# Patient Record
Sex: Male | Born: 1960 | Race: Asian | Hispanic: No | Marital: Married | Smoking: Never smoker
Health system: Southern US, Community
[De-identification: ages and names within clinical notes are randomized; demographics above are authoritative.]

## PROBLEM LIST (undated history)

## (undated) DIAGNOSIS — E119 Type 2 diabetes mellitus without complications: Secondary | ICD-10-CM

## (undated) DIAGNOSIS — C801 Malignant (primary) neoplasm, unspecified: Secondary | ICD-10-CM

## (undated) DIAGNOSIS — C259 Malignant neoplasm of pancreas, unspecified: Secondary | ICD-10-CM

## (undated) DIAGNOSIS — I10 Essential (primary) hypertension: Secondary | ICD-10-CM

## (undated) DIAGNOSIS — K859 Acute pancreatitis without necrosis or infection, unspecified: Secondary | ICD-10-CM

## (undated) DIAGNOSIS — J302 Other seasonal allergic rhinitis: Secondary | ICD-10-CM

## (undated) DIAGNOSIS — K831 Obstruction of bile duct: Secondary | ICD-10-CM

## (undated) HISTORY — PX: HERNIA REPAIR: SHX51

## (undated) HISTORY — PX: VASECTOMY: SHX75

## (undated) HISTORY — PX: OTHER SURGICAL HISTORY: SHX169

---

## 2010-09-21 ENCOUNTER — Ambulatory Visit: Payer: Self-pay | Admitting: Diagnostic Radiology

## 2010-11-10 ENCOUNTER — Emergency Department (HOSPITAL_BASED_OUTPATIENT_CLINIC_OR_DEPARTMENT_OTHER): Admission: EM | Admit: 2010-11-10 | Discharge: 2010-09-21 | Payer: Self-pay | Admitting: Emergency Medicine

## 2012-11-04 ENCOUNTER — Emergency Department (HOSPITAL_BASED_OUTPATIENT_CLINIC_OR_DEPARTMENT_OTHER)
Admission: EM | Admit: 2012-11-04 | Discharge: 2012-11-04 | Disposition: A | Discharge status: Home / Self Care | Payer: BC Managed Care – PPO | Attending: Emergency Medicine | Admitting: Emergency Medicine

## 2012-11-04 ENCOUNTER — Encounter (HOSPITAL_BASED_OUTPATIENT_CLINIC_OR_DEPARTMENT_OTHER): Payer: Self-pay | Admitting: *Deleted

## 2012-11-04 ENCOUNTER — Emergency Department (HOSPITAL_BASED_OUTPATIENT_CLINIC_OR_DEPARTMENT_OTHER): Payer: BC Managed Care – PPO

## 2012-11-04 DIAGNOSIS — R17 Unspecified jaundice: Secondary | ICD-10-CM | POA: Insufficient documentation

## 2012-11-04 DIAGNOSIS — E119 Type 2 diabetes mellitus without complications: Secondary | ICD-10-CM | POA: Insufficient documentation

## 2012-11-04 DIAGNOSIS — K838 Other specified diseases of biliary tract: Secondary | ICD-10-CM

## 2012-11-04 DIAGNOSIS — I10 Essential (primary) hypertension: Secondary | ICD-10-CM | POA: Insufficient documentation

## 2012-11-04 DIAGNOSIS — Q4479 Other congenital malformations of liver: Secondary | ICD-10-CM | POA: Insufficient documentation

## 2012-11-04 DIAGNOSIS — Q441 Other congenital malformations of gallbladder: Secondary | ICD-10-CM | POA: Insufficient documentation

## 2012-11-04 DIAGNOSIS — Z79899 Other long term (current) drug therapy: Secondary | ICD-10-CM | POA: Insufficient documentation

## 2012-11-04 HISTORY — DX: Essential (primary) hypertension: I10

## 2012-11-04 HISTORY — DX: Type 2 diabetes mellitus without complications: E11.9

## 2012-11-04 LAB — CBC WITH DIFFERENTIAL/PLATELET
Basophils Absolute: 0 10*3/uL (ref 0.0–0.1)
Eosinophils Absolute: 0.2 10*3/uL (ref 0.0–0.7)
Eosinophils Relative: 3 % (ref 0–5)
HCT: 36.6 % — ABNORMAL LOW (ref 39.0–52.0)
Lymphocytes Relative: 24 % (ref 12–46)
MCH: 30.2 pg (ref 26.0–34.0)
MCHC: 34.7 g/dL (ref 30.0–36.0)
MCV: 87.1 fL (ref 78.0–100.0)
Monocytes Absolute: 0.5 10*3/uL (ref 0.1–1.0)
Platelets: 228 10*3/uL (ref 150–400)
RDW: 14.1 % (ref 11.5–15.5)

## 2012-11-04 LAB — COMPREHENSIVE METABOLIC PANEL
ALT: 72 U/L — ABNORMAL HIGH (ref 0–53)
AST: 56 U/L — ABNORMAL HIGH (ref 0–37)
CO2: 22 mEq/L (ref 19–32)
Calcium: 9.4 mg/dL (ref 8.4–10.5)
Creatinine, Ser: 0.6 mg/dL (ref 0.50–1.35)
GFR calc Af Amer: >90 mL/min (ref >90)
GFR calc non Af Amer: >90 mL/min (ref >90)
Glucose, Bld: 152 mg/dL — ABNORMAL HIGH (ref 70–99)
Sodium: 137 mEq/L (ref 135–145)
Total Protein: 7.3 g/dL (ref 6.0–8.3)

## 2012-11-04 LAB — PROTIME-INR
INR: 1.08 (ref 0.00–1.49)
Prothrombin Time: 13.9 seconds (ref 11.6–15.2)

## 2012-11-04 LAB — APTT: aPTT: 37 seconds (ref 24–37)

## 2012-11-04 MED ORDER — IOHEXOL 300 MG/ML  SOLN
50.0000 mL | Freq: Once | INTRAMUSCULAR | Status: AC | PRN
  Administered 2012-11-04: 50 mL via ORAL

## 2012-11-04 MED ORDER — SODIUM CHLORIDE 0.9 % IV BOLUS (SEPSIS)
1000.0000 mL | Freq: Once | INTRAVENOUS | Status: AC
  Administered 2012-11-04: 1000 mL via INTRAVENOUS

## 2012-11-04 MED ORDER — IOHEXOL 300 MG/ML  SOLN
100.0000 mL | Freq: Once | INTRAMUSCULAR | Status: AC | PRN
  Administered 2012-11-04: 100 mL via INTRAVENOUS

## 2012-11-04 NOTE — ED Provider Notes (Signed)
History   This chart was scribed for Hector Dern B. Bernette Mayers, MD by Hector Pugh, ED Scribe. This patient was seen in room MH03/MH03 and the patient's care was started at 16:32.   CSN: [id number]  Arrival date & time 11/04/12  1632   First MD Initiated Contact with Patient 11/04/12 1701      Chief Complaint  Patient presents with  . Eye Problem     The history is provided by the patient and a relative. No language interpreter was used.   Hector Pugh is a 51 y.o. male who presents to the Emergency Department complaining of jaundice eyes which began earlier today. His daughter reports that in September he was diagnosed with DM, and has been taking metformin 2x daily to treat it. He had had 3 liver panels drawn previously this year, which showed elevated liver enzymes, normal, and elevated respecitvely. He also had an ultrasound done, although he does not know the results. The patient reports ongoing chills and intermittent body shakes for the past 3 weeks. The patient's daughter also reports decreased appetite, weight loss (25 lbs in the past 3 months), lethargy and occasional abdominal pain. The patient reports that he has stopped taking his evening dose of metformin and has not had improved symptoms, although his sugar levels remained within normal range. He reports taking 500 mg of Tylenol consistently for 2-3 weeks with no relief. He denies emesis, fever, diarrhea or SOB. He has not traveled out of the country recently. He has a history of HTN but denies taking any statins.  Past Medical History  Diagnosis Date  . Diabetes mellitus without complication   . Hypertension     Past Surgical History  Procedure Date  . Hernia repair     History reviewed. No pertinent family history.  History  Substance Use Topics  . Smoking status: Never Smoker   . Smokeless tobacco: Not on file  . Alcohol Use: No      Review of Systems A complete 10 system review of systems was obtained and all  systems are negative except as noted in the HPI and PMH.   Allergies  Review of patient's allergies indicates no known allergies.  Home Medications   Current Outpatient Rx  Name  Route  Sig  Dispense  Refill  . METFORMIN HCL 500 MG PO TABS   Oral   Take 500 mg by mouth 2 (two) times daily with a meal.         . METOPROLOL SUCCINATE ER 100 MG PO TB24   Oral   Take 100 mg by mouth daily. Take with or immediately following a meal.           Triage Vitals: BP 166/67  Pulse 84  Temp 98.1 F (36.7 C) (Oral)  Resp 16  Ht 5\' 2"  (1.575 m)  Wt 150 lb (68.04 kg)  BMI 27.44 kg/m2  SpO2 99%  Physical Exam  Nursing note and vitals reviewed. Constitutional: He is oriented to person, place, and time. He appears well-developed and well-nourished.  HENT:  Head: Normocephalic and atraumatic.  Eyes: EOM are normal. Pupils are equal, round, and reactive to light.       Jaundice to both eyes.  Neck: Normal range of motion. Neck supple.  Cardiovascular: Normal rate, normal heart sounds and intact distal pulses.   Pulmonary/Chest: Effort normal and breath sounds normal.  Abdominal: Bowel sounds are normal. He exhibits no distension. There is no tenderness.  Musculoskeletal: Normal range of motion.  He exhibits no edema and no tenderness.  Neurological: He is alert and oriented to person, place, and time. He has normal strength. No cranial nerve deficit or sensory deficit.  Skin: Skin is warm and dry. No rash noted.  Psychiatric: He has a normal mood and affect.    ED Course  Procedures (including critical care time) DIAGNOSTIC STUDIES: Oxygen Saturation is 99% on room air, normal by my interpretation.    COORDINATION OF CARE: 5:15 PM Discussed treatment plan which includes reviewing ultrasounds and possible CT with pt at bedside and pt agreed to plan. 5:54 PM Rechecked patient. Discussed lab results and ultrasound report and ordered a CT scan .    Labs Reviewed  CBC WITH  DIFFERENTIAL - Abnormal; Notable for the following:    RBC 4.20 (*)     Hemoglobin 12.7 (*)     HCT 36.6 (*)     All other components within normal limits  COMPREHENSIVE METABOLIC PANEL - Abnormal; Notable for the following:    Glucose, Bld 152 (*)     Albumin 3.3 (*)     AST 56 (*)     ALT 72 (*)     Alkaline Phosphatase 189 (*)     Total Bilirubin 11.0 (*)     All other components within normal limits  LIPASE, BLOOD - Abnormal; Notable for the following:    Lipase 784 (*)     All other components within normal limits  PROTIME-INR  APTT   Ct Abdomen Pelvis W Contrast  11/04/2012  *RADIOLOGY REPORT*  Clinical Data: Painless jaundice.  Evaluate for pancreatic mass.  CT ABDOMEN AND PELVIS WITH CONTRAST  Technique:  Multidetector CT imaging of the abdomen and pelvis was performed following the standard protocol during bolus administration of intravenous contrast.  Contrast: 50mL OMNIPAQUE IOHEXOL 300 MG/ML  SOLN, OMNIPAQUE IOHEXOL 300 MG/ML  SOLN  Comparison: None.  Findings: Lung bases are clear.  No pericardial or pleural effusion.  There is mild fatty infiltration of the liver.  The gallbladder appears normal.  Common bile duct is increased in caliber measuring 13 mm, image 31.  The pancreatic duct is also increased in caliber measuring 9 mm, image 41.  At the level of the ampulla there is a focal area of low attenuation measuring 1.4 x 1.5 cm, image 44 of the coronal series.  The spleen appears normal.  Both adrenal glands are normal.  Normal appearance of the kidneys. The urinary bladder appears normal.  Prostate gland and seminal vesicles are negative.  There is no aneurysm identified.  No upper abdominal or pelvic adenopathy identified.  The stomach and the small bowel loops appear normal.  The appendix is visualized and is unremarkable.  Normal appearance of the colon.  Review of the visualized bony structures is unremarkable.  No aggressive lytic or sclerotic bone lesions identified.   IMPRESSION:  1.  Abnormal dilatation of both the pancreatic duct and common bile duct which likely explains the patient's jaundice.  The ducts are dilated to the level of the ampulla and may reflect underlying obstructing mass.  A prominent area of relative hypo attenuation is identified at the level of the ampulla and this may represent a small tumor.  Alternatively, these findings may be secondary to distal common bile duct stone which is occult on CT.  Further evaluation with ERCP and/or MRCP is advised.   Original Report Authenticated By: Signa Kell, M.D.      No diagnosis found.  MDM  Reviewed CT images. Discussed results with patient and family at bedside. He remains pain free and does not want admission. He has been seen by [facility] in the past for routine colonoscopy and would like to follow up with them. I discussed with Dr. Andrey Campanile on call for [facility] and he agrees with close outpatient eval for ERCP. Pt and family advised to call [facility] in the AM to schedule an appointment.   I personally performed the services described in this documentation, which was scribed in my presence. The recorded information has been reviewed and is accurate.          Aziel Morgan B. Bernette Mayers, MD 11/04/12 [phone]

## 2012-11-04 NOTE — ED Notes (Signed)
Pt is drinking PO contrast 

## 2012-11-04 NOTE — ED Notes (Signed)
Pt c/o jaundice eyes.  Hx of elevated liver panel

## 2012-11-04 NOTE — ED Notes (Signed)
MD at bedside. 

## 2012-11-04 NOTE — ED Notes (Signed)
Patient transported to CT via stretcher.

## 2012-11-04 NOTE — ED Notes (Signed)
Family at bedside. 

## 2012-11-04 NOTE — ED Notes (Signed)
Pt returned from radiology.

## 2012-11-06 ENCOUNTER — Encounter (HOSPITAL_COMMUNITY): Payer: Self-pay | Admitting: Pharmacy Technician

## 2012-11-06 ENCOUNTER — Encounter (HOSPITAL_COMMUNITY): Payer: Self-pay | Admitting: *Deleted

## 2012-11-06 NOTE — Progress Notes (Signed)
EKG and CXR on chart from medical doctor's office from 03-2012.

## 2012-11-06 NOTE — Pre-Procedure Instructions (Signed)
Your procedure is scheduled on: Friday, November 07, 2012 Report to [facility] Admitting at:1000 Call this number if you have problems morning of your procedure:[phone]  Follow all bowel prep instructions per your doctor's orders.  Do not eat or drink anything after midnight the night before your procedure. You may brush your teeth, rinse out your mouth, but no water, no food, no chewing gum, no mints, no candies, no chewing tobacco.     Take these medicines the morning of your procedure with A SIP OF WATER: Metoprolol   Please make arrangements for a responsible person to drive you home after the procedure. You cannot go home by cab/taxi. We recommend you have someone with you at home the first 24 hours after your procedure. Driver for procedure is  LEAVE ALL VALUABLES, JEWELRY, BILLFOLD AT HOME.  NO DENTURES, CONTACT LENSES ALLOWED IN THE ENDOSCOPY ROOM.   YOU MAY WEAR DEODORANT, PLEASE REMOVE ALL JEWELRY, WATCHES RINGS, BODY PIERCINGS AND LEAVE AT HOME.   WOMEN: NO MAKE-UP, LOTIONS PERFUMES

## 2012-11-07 ENCOUNTER — Encounter (HOSPITAL_COMMUNITY): Payer: Self-pay | Admitting: *Deleted

## 2012-11-08 ENCOUNTER — Encounter (HOSPITAL_COMMUNITY): Payer: Self-pay | Admitting: *Deleted

## 2012-11-08 ENCOUNTER — Encounter (HOSPITAL_COMMUNITY): Payer: Self-pay | Admitting: Anesthesiology

## 2012-11-08 ENCOUNTER — Ambulatory Visit (HOSPITAL_COMMUNITY)
Admission: RE | Admit: 2012-11-08 | Discharge: 2012-11-08 | Disposition: A | Discharge status: Home / Self Care | Payer: BC Managed Care – PPO | Source: Ambulatory Visit | Attending: Gastroenterology | Admitting: Gastroenterology

## 2012-11-08 ENCOUNTER — Ambulatory Visit (HOSPITAL_COMMUNITY): Payer: BC Managed Care – PPO

## 2012-11-08 ENCOUNTER — Ambulatory Visit (HOSPITAL_COMMUNITY): Payer: BC Managed Care – PPO | Admitting: Anesthesiology

## 2012-11-08 ENCOUNTER — Encounter (HOSPITAL_COMMUNITY)
Admission: RE | Disposition: A | Discharge status: Home / Self Care | Payer: Self-pay | Source: Ambulatory Visit | Attending: Gastroenterology

## 2012-11-08 DIAGNOSIS — I10 Essential (primary) hypertension: Secondary | ICD-10-CM | POA: Diagnosis present

## 2012-11-08 DIAGNOSIS — Z79899 Other long term (current) drug therapy: Secondary | ICD-10-CM | POA: Insufficient documentation

## 2012-11-08 DIAGNOSIS — E119 Type 2 diabetes mellitus without complications: Secondary | ICD-10-CM | POA: Insufficient documentation

## 2012-11-08 DIAGNOSIS — Y9210 Unspecified residential institution as the place of occurrence of the external cause: Secondary | ICD-10-CM | POA: Diagnosis present

## 2012-11-08 DIAGNOSIS — C259 Malignant neoplasm of pancreas, unspecified: Secondary | ICD-10-CM | POA: Insufficient documentation

## 2012-11-08 DIAGNOSIS — C241 Malignant neoplasm of ampulla of Vater: Secondary | ICD-10-CM | POA: Diagnosis present

## 2012-11-08 DIAGNOSIS — K859 Acute pancreatitis without necrosis or infection, unspecified: Principal | ICD-10-CM | POA: Diagnosis present

## 2012-11-08 DIAGNOSIS — Y849 Medical procedure, unspecified as the cause of abnormal reaction of the patient, or of later complication, without mention of misadventure at the time of the procedure: Secondary | ICD-10-CM | POA: Diagnosis present

## 2012-11-08 DIAGNOSIS — K831 Obstruction of bile duct: Secondary | ICD-10-CM | POA: Diagnosis present

## 2012-11-08 DIAGNOSIS — K838 Other specified diseases of biliary tract: Secondary | ICD-10-CM | POA: Insufficient documentation

## 2012-11-08 HISTORY — PX: FINE NEEDLE ASPIRATION: SHX5430

## 2012-11-08 HISTORY — PX: EUS: SHX5427

## 2012-11-08 HISTORY — PX: ERCP: SHX5425

## 2012-11-08 LAB — GLUCOSE, CAPILLARY
Glucose-Capillary: 113 mg/dL — ABNORMAL HIGH (ref 70–99)
Glucose-Capillary: 118 mg/dL — ABNORMAL HIGH (ref 70–99)

## 2012-11-08 SURGERY — UPPER ENDOSCOPIC ULTRASOUND (EUS) LINEAR
Anesthesia: General

## 2012-11-08 MED ORDER — FENTANYL CITRATE 0.05 MG/ML IJ SOLN: 25.0000 ug | INTRAMUSCULAR | Status: DC | PRN

## 2012-11-08 MED ORDER — SUCCINYLCHOLINE CHLORIDE 20 MG/ML IJ SOLN
INTRAMUSCULAR | Status: DC | PRN
  Administered 2012-11-08: 100 mg via INTRAVENOUS

## 2012-11-08 MED ORDER — PROPOFOL 10 MG/ML IV BOLUS
INTRAVENOUS | Status: DC | PRN
  Administered 2012-11-08: 180 mg via INTRAVENOUS

## 2012-11-08 MED ORDER — IOHEXOL 300 MG/ML  SOLN
INTRAMUSCULAR | Status: DC | PRN
  Administered 2012-11-08: 23 mL

## 2012-11-08 MED ORDER — ONDANSETRON HCL 4 MG/2ML IJ SOLN
INTRAMUSCULAR | Status: DC | PRN
  Administered 2012-11-08: 4 mg via INTRAVENOUS

## 2012-11-08 MED ORDER — LACTATED RINGERS IV SOLN
INTRAVENOUS | Status: DC
  Administered 2012-11-08 (×3): via INTRAVENOUS

## 2012-11-08 MED ORDER — LIDOCAINE HCL (CARDIAC) 20 MG/ML IV SOLN
INTRAVENOUS | Status: DC | PRN
  Administered 2012-11-08: 50 mg via INTRAVENOUS

## 2012-11-08 MED ORDER — METOCLOPRAMIDE HCL 5 MG/ML IJ SOLN
INTRAMUSCULAR | Status: DC | PRN
  Administered 2012-11-08: 10 mg via INTRAVENOUS

## 2012-11-08 MED ORDER — CIPROFLOXACIN IN D5W 400 MG/200ML IV SOLN
400.0000 mg | Freq: Once | INTRAVENOUS | Status: AC
  Administered 2012-11-08: 400 mg via INTRAVENOUS

## 2012-11-08 MED ORDER — SODIUM CHLORIDE 0.9 % IV SOLN: INTRAVENOUS | Status: DC

## 2012-11-08 MED ORDER — FENTANYL CITRATE 0.05 MG/ML IJ SOLN
INTRAMUSCULAR | Status: DC | PRN
  Administered 2012-11-08: 100 ug via INTRAVENOUS
  Administered 2012-11-08: 50 ug via INTRAVENOUS

## 2012-11-08 MED ORDER — ROCURONIUM BROMIDE 100 MG/10ML IV SOLN
INTRAVENOUS | Status: DC | PRN
  Administered 2012-11-08: 15 mg via INTRAVENOUS

## 2012-11-08 MED ORDER — MIDAZOLAM HCL 5 MG/5ML IJ SOLN
INTRAMUSCULAR | Status: DC | PRN
  Administered 2012-11-08: 2 mg via INTRAVENOUS

## 2012-11-08 MED ORDER — CIPROFLOXACIN IN D5W 400 MG/200ML IV SOLN
INTRAVENOUS | Status: AC
  Filled 2012-11-08: qty 200

## 2012-11-08 NOTE — OR Nursing (Signed)
Spoke to Dr. Ewing Schlein and Dr. Leta Jungling, both stated it was okay for pt to be discharged, as pt is stable.  Angelique Blonder, RN

## 2012-11-08 NOTE — Anesthesia Preprocedure Evaluation (Addendum)
Anesthesia Evaluation  Patient identified by MRN, date of birth, ID band Patient awake    Reviewed: Allergy & Precautions, H&P , NPO status , Patient's Chart, lab work & pertinent test results, reviewed documented beta blocker date and time   Airway Mallampati: II TM Distance: >3 FB Neck ROM: full    Dental No notable dental hx.    Pulmonary neg pulmonary ROS,  breath sounds clear to auscultation  Pulmonary exam normal       Cardiovascular Exercise Tolerance: Good hypertension, Pt. on home beta blockers Rhythm:regular Rate:Normal     Neuro/Psych negative neurological ROS  negative psych ROS   GI/Hepatic negative GI ROS, Neg liver ROS,   Endo/Other  diabetes, Well Controlled, Type 2, Oral Hypoglycemic Agents  Renal/GU negative Renal ROS  negative genitourinary   Musculoskeletal   Abdominal   Peds  Hematology negative hematology ROS (+)   Anesthesia Other Findings   Reproductive/Obstetrics negative OB ROS                           Anesthesia Physical Anesthesia Plan  ASA: III  Anesthesia Plan: General   Post-op Pain Management:    Induction:   Airway Management Planned: Oral ETT  Additional Equipment:   Intra-op Plan:   Post-operative Plan: Extubation in OR  Informed Consent: I have reviewed the patients History and Physical, chart, labs and discussed the procedure including the risks, benefits and alternatives for the proposed anesthesia with the patient or authorized representative who has indicated his/her understanding and acceptance.   Dental Advisory Given  Plan Discussed with: CRNA and Surgeon  Anesthesia Plan Comments:        Anesthesia Quick Evaluation

## 2012-11-08 NOTE — H&P (Signed)
Patient interval history reviewed.  Patient examined again.  There has been no change from documented H/P dated 11/05/12 (scanned into chart from our office) except as documented above.  Assessment:  1.  Obstructive jaundice.  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  Plan:  1.  Endoscopic ultrasound with possible fine needle aspiration (FNA). 2.  Risks (bleeding, infection, bowel perforation that could require surgery, sedation-related changes in cardiopulmonary systems), benefits (identification and possible treatment of source of symptoms, exclusion of certain causes of symptoms), and alternatives (watchful waiting, radiographic imaging studies, empiric medical treatment) of upper endoscopy with ultrasound and possible fine needle aspiration (EUS +/- FNA) were explained to patient/wife in detail and he wishes to proceed. 3.  Endoscopic retrograde cholangiopancreatography (ERCP) with likely stent (likely wallstent) placement. 4.  Risks (up to and including bleeding, infection, perforation, pancreatitis that can be complicated by infected necrosis and death), benefits (removal of stones, alleviating blockage, decreasing risk of cholangitis or choledocholithiasis-related pancreatitis), and alternatives (watchful waiting, percutaneous transhepatic cholangiography) of ERCP were explained to patient/wife in detail and he elects to proceed.

## 2012-11-08 NOTE — Transfer of Care (Signed)
Immediate Anesthesia Transfer of Care Note  Patient: Hector Pugh  Procedure(s) Performed: Procedure(s) (LRB) with comments: UPPER ENDOSCOPIC ULTRASOUND (EUS) LINEAR (N/A) FINE NEEDLE ASPIRATION (FNA) LINEAR (N/A) ENDOSCOPIC RETROGRADE CHOLANGIOPANCREATOGRAPHY (ERCP) (N/A)  Patient Location: PACU and Endoscopy Unit  Anesthesia Type:General  Level of Consciousness: awake, alert , oriented and patient cooperative  Airway & Oxygen Therapy: Patient Spontanous Breathing and Patient connected to face mask oxygen  Post-op Assessment: Report given to PACU RN, Post -op Vital signs reviewed and stable and Patient moving all extremities  Post vital signs: Reviewed and stable  Complications: No apparent anesthesia complications

## 2012-11-08 NOTE — Op Note (Signed)
[facility] [address] [place], [address]   ERCP PROCEDURE REPORT  PATIENT: Hector Pugh, Hector Pugh  MR# :[id number] BIRTHDATE: Aug 08, 1961  GENDER: Male ENDOSCOPIST: Vida Rigger, MD REFERRED BY: PROCEDURE DATE:  11/08/2012 PROCEDURE:   ERCP with sphincterotomy/papillotomy and ERCP with stent placement ASA CLASS:    2 INDICATIONS: obstructive jaundicein patient with positive biopsy MEDICATIONS:   general anesthesia TOPICAL ANESTHETIC:  none  DESCRIPTION OF PROCEDURE:   After the risks benefits and alternatives of the procedure were thoroughly explained, informed consent was obtained.  The Pentax ERCP [id number]  endoscope was introduced through the mouth and advanced to the second portion of the duodenum .the ampullary mass was brought into view and using the triple lumen sphincterotome loaded with the JAG Jaguar the pancreatic duct was cannulated which was dilated and we went ahead and left the wire in there and then were able to get deep selective cannulation on the next attempt and only minimal dye was placed in the pancreas to confirm our location and wants deep selective cannulation of the CBD was confirmed and the JAG Jagwire was advanced into the intrahepatic and dye was injected to confirm that we were in the CBD we went ahead and remove the wire from the pancreas and then proceeded with a small sphincterotomyin the customary fashionuntil we could get the one third bowed  sphincter tone easily in and out of the duct.the stricture was evaluated under fluoroscopyand seemed to be very short and we went ahead and placed a fully coveredremovable metal stent 10 French 4 cm in the customary fashion both under endoscopic and fluoroscopy guidance with excellent biliary drainage and the wire was removed and the scope was removed and there was adequate biliary drainage and some pancreatic duct drainage although sluggish and we elected to stop the procedure at this point  and the scope was removed and the patient tolerated the procedure well       COMPLICATIONS: none ENDOSCOPIC IMPRESSION:ampullary mass with short distal stricture status post small sphincterotomy and mental fully covered stent  RECOMMENDATIONS:observe for delayed complications if none surgical consultation per my partner Dr. Dulce Sellar     _______________________________ eSigned:  Vida Rigger, MD 11/08/2012 2:09 PM   CC:

## 2012-11-08 NOTE — Op Note (Signed)
[facility] [address] [place], [address]   ENDOSCOPIC ULTRASOUND PROCEDURE REPORT  PATIENT: Hector Pugh, Hector Pugh  MR#: [id number] BIRTHDATE: [birth year]  GENDER: Male ENDOSCOPIST: Willis Modena, MD REFERRED BY:  Arna Medici.  Kathrynn Speed, M.D. PROCEDURE DATE:  11/08/2012 PROCEDURE:   Upper EUS w/FNA ASA CLASS:      Class III INDICATIONS:   1.  obstructive jaundice, abnormal CT. MEDICATIONS:  DESCRIPTION OF PROCEDURE:   After the risks benefits and alternatives of the procedure were  explained, informed consent was obtained. The patient was then placed in the left, lateral, decubitus postion and IV sedation was administered. Throughout the procedure, the patients blood pressure, pulse and oxygen saturations were monitored continuously.  Under direct visualization, the linear       endoscope was introduced through the mouth  and advanced to the second portion of the duodenum . Water was used as necessary to provide an acoustic interface.  Upon completion of the imaging, water was removed and the patient was sent to the recovery room in satisfactory condition.    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  Bile duct and pancreatic duct were both dilated upstream of an 18 x 20 mm ampulla lesion, visualized both endoscopically and via EUS.  The pancreas does not appear to be invaded by this lesion.  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  There was no neighboring adenopathy and no involvement of lesion with SMA, celiac artery or portal vein.  IMPRESSION:      Ampullary lesion, preliminary biopsies consistent with adenocarcinoma.  No clear involvement with pancreas or neighboring structures.  RECOMMENDATIONS:     1.  Watch for potential complications of procedure. 2.  Await final cytology results. 3.  Proceed with  ERCP with Dr. Ewing Schlein today. 4.  Expedited surgical consultation with Dr. Donell Beers for consideration of Whipple's procedure.   _______________________________ eSigned:  Willis Modena, MD 11/08/2012 1:29 PM   CC:

## 2012-11-08 NOTE — Anesthesia Postprocedure Evaluation (Signed)
  Anesthesia Post-op Note  Patient: Hector Pugh  Procedure(s) Performed: Procedure(s) (LRB): UPPER ENDOSCOPIC ULTRASOUND (EUS) LINEAR (N/A) FINE NEEDLE ASPIRATION (FNA) LINEAR (N/A) ENDOSCOPIC RETROGRADE CHOLANGIOPANCREATOGRAPHY (ERCP) (N/A)  Patient Location: PACU  Anesthesia Type: General  Level of Consciousness: awake and alert   Airway and Oxygen Therapy: Patient Spontanous Breathing  Post-op Pain: mild  Post-op Assessment: Post-op Vital signs reviewed, Patient's Cardiovascular Status Stable, Respiratory Function Stable, Patent Airway and No signs of Nausea or vomiting  Last Vitals:  Filed Vitals:   11/08/12 1500  BP: 142/95  Pulse:   Temp:   Resp: 35    Post-op Vital Signs: stable   Complications: No apparent anesthesia complications

## 2012-11-09 ENCOUNTER — Encounter (HOSPITAL_COMMUNITY): Payer: Self-pay | Admitting: Emergency Medicine

## 2012-11-09 ENCOUNTER — Inpatient Hospital Stay (HOSPITAL_COMMUNITY)
Admission: EM | Admit: 2012-11-09 | Discharge: 2012-11-16 | DRG: 204 | Disposition: A | Discharge status: Home / Self Care | Payer: BC Managed Care – PPO | Source: Ambulatory Visit | Attending: Gastroenterology | Admitting: Gastroenterology

## 2012-11-09 DIAGNOSIS — R109 Unspecified abdominal pain: Secondary | ICD-10-CM

## 2012-11-09 DIAGNOSIS — K831 Obstruction of bile duct: Secondary | ICD-10-CM | POA: Diagnosis present

## 2012-11-09 DIAGNOSIS — I10 Essential (primary) hypertension: Secondary | ICD-10-CM

## 2012-11-09 DIAGNOSIS — C241 Malignant neoplasm of ampulla of Vater: Secondary | ICD-10-CM

## 2012-11-09 DIAGNOSIS — K859 Acute pancreatitis without necrosis or infection, unspecified: Principal | ICD-10-CM

## 2012-11-09 DIAGNOSIS — E119 Type 2 diabetes mellitus without complications: Secondary | ICD-10-CM

## 2012-11-09 HISTORY — DX: Type 2 diabetes mellitus without complications: E11.9

## 2012-11-09 HISTORY — DX: Acute pancreatitis without necrosis or infection, unspecified: K85.90

## 2012-11-09 HISTORY — DX: Obstruction of bile duct: K83.1

## 2012-11-09 HISTORY — DX: Essential (primary) hypertension: I10

## 2012-11-09 LAB — URINALYSIS, ROUTINE W REFLEX MICROSCOPIC
Hgb urine dipstick: NEGATIVE
Ketones, ur: 15 mg/dL — AB
Specific Gravity, Urine: 1.014 (ref 1.005–1.030)
Urobilinogen, UA: 0.2 mg/dL (ref 0.0–1.0)
pH: 5 (ref 5.0–8.0)

## 2012-11-09 LAB — GLUCOSE, CAPILLARY
Glucose-Capillary: 202 mg/dL — ABNORMAL HIGH (ref 70–99)
Glucose-Capillary: 187 mg/dL — ABNORMAL HIGH (ref 70–99)
Glucose-Capillary: 176 mg/dL — ABNORMAL HIGH (ref 70–99)

## 2012-11-09 LAB — CBC WITH DIFFERENTIAL/PLATELET
Eosinophils Absolute: 0.1 10*3/uL (ref 0.0–0.7)
Hemoglobin: 14.1 g/dL (ref 13.0–17.0)
Lymphocytes Relative: 16 % (ref 12–46)
Lymphs Abs: 2.6 10*3/uL (ref 0.7–4.0)
Monocytes Relative: 4 % (ref 3–12)
Neutro Abs: 12.5 10*3/uL — ABNORMAL HIGH (ref 1.7–7.7)
Neutrophils Relative %: 79 % — ABNORMAL HIGH (ref 43–77)
Platelets: 324 10*3/uL (ref 150–400)
RBC: 4.72 MIL/uL (ref 4.22–5.81)
WBC: 15.8 10*3/uL — ABNORMAL HIGH (ref 4.0–10.5)

## 2012-11-09 LAB — COMPREHENSIVE METABOLIC PANEL
ALT: 60 U/L — ABNORMAL HIGH (ref 0–53)
Alkaline Phosphatase: 202 U/L — ABNORMAL HIGH (ref 39–117)
Chloride: 98 mEq/L (ref 96–112)
GFR calc Af Amer: >90 mL/min (ref >90)
Glucose, Bld: 217 mg/dL — ABNORMAL HIGH (ref 70–99)
Potassium: 3.7 mEq/L (ref 3.5–5.1)
Sodium: 137 mEq/L (ref 135–145)
Total Bilirubin: 11.2 mg/dL — ABNORMAL HIGH (ref 0.3–1.2)
Total Protein: 7.3 g/dL (ref 6.0–8.3)

## 2012-11-09 MED ORDER — SODIUM CHLORIDE 0.9 % IV SOLN
INTRAVENOUS | Status: AC
  Administered 2012-11-09: 13:00:00 via INTRAVENOUS

## 2012-11-09 MED ORDER — SODIUM CHLORIDE 0.9 % IV SOLN
INTRAVENOUS | Status: AC
  Administered 2012-11-09: 100 mL/h via INTRAVENOUS

## 2012-11-09 MED ORDER — HYDROMORPHONE HCL PF 2 MG/ML IJ SOLN
3.0000 mg | Freq: Once | INTRAMUSCULAR | Status: AC
  Administered 2012-11-09: 3 mg via INTRAVENOUS
  Filled 2012-11-09 (×2): qty 1

## 2012-11-09 MED ORDER — HYDROMORPHONE HCL PF 1 MG/ML IJ SOLN
1.0000 mg | Freq: Once | INTRAMUSCULAR | Status: AC
  Administered 2012-11-09: 1 mg via INTRAVENOUS
  Filled 2012-11-09: qty 1

## 2012-11-09 MED ORDER — FENTANYL 25 MCG/HR TD PT72
25.0000 ug | MEDICATED_PATCH | TRANSDERMAL | Status: DC
  Administered 2012-11-09 – 2012-11-12 (×2): 25 ug via TRANSDERMAL
  Filled 2012-11-09 (×2): qty 1

## 2012-11-09 MED ORDER — INSULIN ASPART 100 UNIT/ML ~~LOC~~ SOLN: 0.0000 [IU] | Freq: Three times a day (TID) | SUBCUTANEOUS | Status: DC

## 2012-11-09 MED ORDER — INSULIN ASPART 100 UNIT/ML ~~LOC~~ SOLN: 0.0000 [IU] | Freq: Every day | SUBCUTANEOUS | Status: DC

## 2012-11-09 MED ORDER — METRONIDAZOLE IN NACL 5-0.79 MG/ML-% IV SOLN
500.0000 mg | Freq: Three times a day (TID) | INTRAVENOUS | Status: DC
  Administered 2012-11-09 – 2012-11-14 (×16): 500 mg via INTRAVENOUS
  Filled 2012-11-09 (×17): qty 100

## 2012-11-09 MED ORDER — INSULIN ASPART 100 UNIT/ML ~~LOC~~ SOLN
0.0000 [IU] | SUBCUTANEOUS | Status: DC
  Administered 2012-11-09: 2 [IU] via SUBCUTANEOUS
  Administered 2012-11-09: 5 [IU] via SUBCUTANEOUS
  Administered 2012-11-09: 2 [IU] via SUBCUTANEOUS
  Administered 2012-11-09: 3 [IU] via SUBCUTANEOUS
  Administered 2012-11-10 (×2): 1 [IU] via SUBCUTANEOUS
  Administered 2012-11-10: 3 [IU] via SUBCUTANEOUS
  Administered 2012-11-10: 1 [IU] via SUBCUTANEOUS
  Administered 2012-11-10: 2 [IU] via SUBCUTANEOUS
  Administered 2012-11-11 – 2012-11-13 (×8): 1 [IU] via SUBCUTANEOUS
  Administered 2012-11-13: 2 [IU] via SUBCUTANEOUS
  Administered 2012-11-13: 1 [IU] via SUBCUTANEOUS
  Administered 2012-11-14: 2 [IU] via SUBCUTANEOUS
  Administered 2012-11-14 (×2): 1 [IU] via SUBCUTANEOUS
  Administered 2012-11-14 – 2012-11-15 (×2): 2 [IU] via SUBCUTANEOUS
  Administered 2012-11-15 (×2): 1 [IU] via SUBCUTANEOUS
  Administered 2012-11-15 – 2012-11-16 (×3): 2 [IU] via SUBCUTANEOUS
  Administered 2012-11-16: 1 [IU] via SUBCUTANEOUS

## 2012-11-09 MED ORDER — CIPROFLOXACIN IN D5W 400 MG/200ML IV SOLN
400.0000 mg | Freq: Two times a day (BID) | INTRAVENOUS | Status: DC
  Administered 2012-11-09 – 2012-11-14 (×11): 400 mg via INTRAVENOUS
  Filled 2012-11-09 (×14): qty 200

## 2012-11-09 MED ORDER — PANTOPRAZOLE SODIUM 40 MG IV SOLR
40.0000 mg | INTRAVENOUS | Status: DC
  Administered 2012-11-09 – 2012-11-14 (×5): 40 mg via INTRAVENOUS
  Filled 2012-11-09 (×7): qty 40

## 2012-11-09 MED ORDER — HYDROMORPHONE HCL PF 1 MG/ML IJ SOLN
1.0000 mg | INTRAMUSCULAR | Status: AC | PRN
  Administered 2012-11-09 (×4): 1 mg via INTRAVENOUS
  Filled 2012-11-09 (×4): qty 1

## 2012-11-09 MED ORDER — ONDANSETRON HCL 4 MG/2ML IJ SOLN: 4.0000 mg | Freq: Three times a day (TID) | INTRAMUSCULAR | Status: AC | PRN

## 2012-11-09 MED ORDER — ONDANSETRON HCL 4 MG/2ML IJ SOLN
4.0000 mg | Freq: Four times a day (QID) | INTRAMUSCULAR | Status: DC | PRN
  Administered 2012-11-09: 4 mg via INTRAVENOUS
  Filled 2012-11-09: qty 2

## 2012-11-09 MED ORDER — SODIUM CHLORIDE 0.9 % IV SOLN: INTRAVENOUS | Status: DC

## 2012-11-09 MED ORDER — HYDRALAZINE HCL 20 MG/ML IJ SOLN
10.0000 mg | Freq: Three times a day (TID) | INTRAMUSCULAR | Status: DC | PRN
  Filled 2012-11-09: qty 0.5

## 2012-11-09 NOTE — ED Notes (Signed)
Patient reports that he had an endoscopy earlier today; patient had ERPC with stent placement today..  Patient reports that he has had abdominal pain ever since; was told by the doctor on call to go to the emergency room to rule out appendicitis.

## 2012-11-09 NOTE — ED Notes (Signed)
Pt wants to ambulate to restroom before going upstairs.

## 2012-11-09 NOTE — H&P (Signed)
Hector Pugh is an 51 y.o. male.   Chief Complaint: Abdominal pain HPI: Patient well-known to me from yesterday's ERCP who has done well until about 10 PM at night when he began having mild pain when the pain got worse and Tylenol did not help they called me and I asked them to go to the emergency room and then I notified the emergency room staff of his history and his procedure and he was found to have a lipase of greater than 3000 and increased white count and was admitted for pancreatitis. His ERCP was done for painless jaundice and the double duct sign on CT and his family history is negative and he has minimal previous medical problems and currently he is not having any nausea or vomiting or fever and had passed air last night but not yet today. Past Medical History  Diagnosis Date  . Diabetes mellitus without complication   . Hypertension     Past Surgical History  Procedure Date  . Circucision   . Hernia repair     RIGHT  . Left foot surgery   . Vasectomy   . Circumsision   . Left foot surgery     History reviewed. No pertinent family history. Social History:  reports that he has never smoked. He has never used smokeless tobacco. He reports that he does not drink alcohol or use illicit drugs.  Allergies: No Known Allergies  Medications Prior to Admission  Medication Sig Dispense Refill  . loratadine (CLARITIN) 10 MG tablet Take 10 mg by mouth daily.      . metoprolol succinate (TOPROL-XL) 100 MG 24 hr tablet Take 100 mg by mouth every morning. Take with or immediately following a meal.        Results for orders placed during the hospital encounter of 11/09/12 (from the past 48 hour(s))  URINALYSIS, ROUTINE W REFLEX MICROSCOPIC     Status: Abnormal   Collection Time   11/09/12  3:32 AM      Component Value Range Comment   Color, Urine ORANGE (*) YELLOW BIOCHEMICALS MAY BE AFFECTED BY COLOR   APPearance CLEAR  CLEAR    Specific Gravity, Urine 1.014  1.005 - 1.030    pH 5.0   5.0 - 8.0    Glucose, UA NEGATIVE  NEGATIVE mg/dL    Hgb urine dipstick NEGATIVE  NEGATIVE    Bilirubin Urine LARGE (*) NEGATIVE    Ketones, ur 15 (*) NEGATIVE mg/dL    Protein, ur NEGATIVE  NEGATIVE mg/dL    Urobilinogen, UA 0.2  0.0 - 1.0 mg/dL    Nitrite NEGATIVE  NEGATIVE    Leukocytes, UA NEGATIVE  NEGATIVE MICROSCOPIC NOT DONE ON URINES WITH NEGATIVE PROTEIN, BLOOD, LEUKOCYTES, NITRITE, OR GLUCOSE <1000 mg/dL.  CBC WITH DIFFERENTIAL     Status: Abnormal   Collection Time   11/09/12  3:53 AM      Component Value Range Comment   WBC 15.8 (*) 4.0 - 10.5 K/uL    RBC 4.72  4.22 - 5.81 MIL/uL    Hemoglobin 14.1  13.0 - 17.0 g/dL    HCT 16.1  09.6 - 04.5 %    MCV 88.6  78.0 - 100.0 fL    MCH 29.9  26.0 - 34.0 pg    MCHC 33.7  30.0 - 36.0 g/dL    RDW 40.9  81.1 - 91.4 %    Platelets 324  150 - 400 K/uL    Neutrophils Relative 79 (*) 43 -  77 %    Neutro Abs 12.5 (*) 1.7 - 7.7 K/uL    Lymphocytes Relative 16  12 - 46 %    Lymphs Abs 2.6  0.7 - 4.0 K/uL    Monocytes Relative 4  3 - 12 %    Monocytes Absolute 0.6  0.1 - 1.0 K/uL    Eosinophils Relative 0  0 - 5 %    Eosinophils Absolute 0.1  0.0 - 0.7 K/uL    Basophils Relative 0  0 - 1 %    Basophils Absolute 0.0  0.0 - 0.1 K/uL   COMPREHENSIVE METABOLIC PANEL     Status: Abnormal   Collection Time   11/09/12  3:53 AM      Component Value Range Comment   Sodium 137  135 - 145 mEq/L    Potassium 3.7  3.5 - 5.1 mEq/L    Chloride 98  96 - 112 mEq/L    CO2 24  19 - 32 mEq/L    Glucose, Bld 217 (*) 70 - 99 mg/dL    BUN 10  6 - 23 mg/dL    Creatinine, Ser 1.61  0.50 - 1.35 mg/dL    Calcium 9.8  8.4 - 09.6 mg/dL    Total Protein 7.3  6.0 - 8.3 g/dL    Albumin 3.2 (*) 3.5 - 5.2 g/dL    AST 56 (*) 0 - 37 U/L    ALT 60 (*) 0 - 53 U/L    Alkaline Phosphatase 202 (*) 39 - 117 U/L    Total Bilirubin 11.2 (*) 0.3 - 1.2 mg/dL    GFR calc non Af Amer >90  >90 mL/min    GFR calc Af Amer >90  >90 mL/min   LIPASE, BLOOD     Status:  Abnormal   Collection Time   11/09/12  3:53 AM      Component Value Range Comment   Lipase >3000 (*) 11 - 59 U/L    Dg Ercp With Sphincterotomy  11/08/2012  *RADIOLOGY REPORT*  Clinical Data: ERCP with sphincterotomy  ERCP two views from an ERCP were submitted  Comparison:  Abdomen and pelvis CT, 11/04/2012  Technique:  Multiple spot images obtained with the fluoroscopic device and submitted for interpretation post-procedure.  ERCP was performed by Dr. Dulce Sellar.  Findings: Imaging shows the scope partial injection of the mid and distal common bile duct and then a dilated pancreatic duct.  No additional images provided.  By report, a sphincterotomy was performed.  IMPRESSION: To the e r c p views as described with ERCP performed for obstructive jaundice.  These images were submitted for radiologic interpretation only. Please see the procedural report for the amount of contrast and the fluoroscopy time utilized.   Original Report Authenticated By: Amie Portland, M.D.     ROS negative except above  Blood pressure 177/94, pulse 67, temperature 97.9 F (36.6 C), temperature source Oral, resp. rate 18, SpO2 99.00%. Physical Exam vital signs stable afebrile no acute distress sitting up fairly comfortably in bed sclera icteric neck supple lungs are clear heart regular rate and rhythm abdomen is soft decreased but present bowel sound minimal midepigastric discomfort without guarding or rebound no pedal edema labs reviewed  Assessment/Plan  post ERCP pancreatitis Plan: IV fluids pain medicines and antibiotics and consider TPN or if he does well advance diet soon and I have discussed all the above with the patient and his family as well as the diagnosis and plans [facility] E 11/09/2012,  9:09 AM

## 2012-11-09 NOTE — Consult Note (Signed)
Reason for Consult:Periampullary cancer Referring Physician: Vida Rigger, MD  Hector Pugh is an 51 y.o. male.  HPI:  Pt is 51 yo M who presented with jaundice 1 week ago.  He has undergone stenting and EUS which demonstrated a 1.8 cm mass that was far from vascular structures.  No lymph nodes were involved, and the mass did not appear to invade the pancreas.  He is admitted for post-ERCP pancreatitis.  Of note, he developed DM 3 months ago, and has deliberately lost weight in order to stay off antihyperglycemic medications.  He had an ultrasound based on abnormal LFTs, followed by a CT scan.  His daughter is Charity fundraiser in [phone].  He is very healthy overall.  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    Past Medical History  Diagnosis Date  . Diabetes mellitus without complication   . Hypertension     Past Surgical History  Procedure Date  . Circucision   . Hernia repair     RIGHT  . Left foot surgery   . Vasectomy   . Circumsision   . Left foot surgery     History reviewed. No pertinent family history.  Social History:  reports that he has never smoked. He has never used smokeless tobacco. He reports that he does not drink alcohol or use illicit drugs.  Allergies: No Known Allergies  Medications: MedicationsLong-Term  Prescriptions Show Facility-Administered Medications    loratadine (CLARITIN) 10 MG tablet   metoprolol succinate (TOPROL-XL) 100 MG 24 hr tablet     Results for orders placed during the hospital encounter of 11/09/12 (from the past 48 hour(s))  URINALYSIS, ROUTINE W REFLEX MICROSCOPIC     Status: Abnormal   Collection Time   11/09/12  3:32 AM      Component Value Range Comment   Color, Urine ORANGE (*) YELLOW BIOCHEMICALS MAY BE AFFECTED BY COLOR   APPearance CLEAR  CLEAR    Specific Gravity, Urine 1.014  1.005 - 1.030    pH 5.0  5.0 - 8.0    Glucose, UA NEGATIVE  NEGATIVE mg/dL    Hgb urine dipstick NEGATIVE  NEGATIVE    Bilirubin Urine LARGE (*) NEGATIVE    Ketones, ur 15 (*) NEGATIVE mg/dL    Protein, ur NEGATIVE  NEGATIVE mg/dL    Urobilinogen, UA 0.2  0.0 - 1.0 mg/dL    Nitrite NEGATIVE  NEGATIVE    Leukocytes, UA NEGATIVE  NEGATIVE MICROSCOPIC NOT DONE ON URINES WITH NEGATIVE PROTEIN, BLOOD, LEUKOCYTES, NITRITE, OR GLUCOSE <1000 mg/dL.  CBC WITH DIFFERENTIAL     Status: Abnormal   Collection Time   11/09/12  3:53 AM      Component Value Range Comment   WBC 15.8 (*) 4.0 - 10.5 K/uL    RBC 4.72  4.22 - 5.81 MIL/uL    Hemoglobin 14.1  13.0 - 17.0 g/dL    HCT 16.1  09.6 - 04.5 %    MCV 88.6  78.0 - 100.0 fL    MCH 29.9  26.0 - 34.0 pg    MCHC 33.7  30.0 - 36.0 g/dL    RDW 40.9  81.1 - 91.4 %    Platelets 324  150 - 400 K/uL    Neutrophils Relative 79 (*) 43 - 77 %    Neutro Abs 12.5 (*) 1.7 - 7.7 K/uL    Lymphocytes Relative 16  12 - 46 %    Lymphs Abs 2.6  0.7 - 4.0 K/uL  Monocytes Relative 4  3 - 12 %    Monocytes Absolute 0.6  0.1 - 1.0 K/uL    Eosinophils Relative 0  0 - 5 %    Eosinophils Absolute 0.1  0.0 - 0.7 K/uL    Basophils Relative 0  0 - 1 %    Basophils Absolute 0.0  0.0 - 0.1 K/uL   COMPREHENSIVE METABOLIC PANEL     Status: Abnormal   Collection Time   11/09/12  3:53 AM      Component Value Range Comment   Sodium 137  135 - 145 mEq/L    Potassium 3.7  3.5 - 5.1 mEq/L    Chloride 98  96 - 112 mEq/L    CO2 24  19 - 32 mEq/L    Glucose, Bld 217 (*) 70 - 99 mg/dL    BUN 10  6 - 23 mg/dL    Creatinine, Ser 7.84  0.50 - 1.35 mg/dL    Calcium 9.8  8.4 - 69.6 mg/dL    Total Protein 7.3  6.0 - 8.3 g/dL    Albumin 3.2 (*) 3.5 - 5.2 g/dL    AST 56 (*) 0 - 37 U/L    ALT 60 (*) 0 - 53 U/L    Alkaline Phosphatase 202 (*) 39 - 117 U/L    Total Bilirubin 11.2 (*) 0.3 - 1.2 mg/dL    GFR calc non Af Amer >90  >90 mL/min    GFR calc Af Amer >90  >90 mL/min   LIPASE, BLOOD     Status: Abnormal   Collection Time   11/09/12  3:53 AM      Component Value Range Comment   Lipase >3000 (*) 11 - 59 U/L   GLUCOSE, CAPILLARY      Status: Abnormal   Collection Time   11/09/12  9:18 AM      Component Value Range Comment   Glucose-Capillary 225 (*) 70 - 99 mg/dL    Comment 1 Documented in Chart      Comment 2 Notify RN     GLUCOSE, CAPILLARY     Status: Abnormal   Collection Time   11/09/12 12:10 PM      Component Value Range Comment   Glucose-Capillary 202 (*) 70 - 99 mg/dL    Comment 1 Documented in Chart      Comment 2 Notify RN       Dg Ercp With Sphincterotomy  11/08/2012  *RADIOLOGY REPORT*  Clinical Data: ERCP with sphincterotomy  ERCP two views from an ERCP were submitted  Comparison:  Abdomen and pelvis CT, 11/04/2012  Technique:  Multiple spot images obtained with the fluoroscopic device and submitted for interpretation post-procedure.  ERCP was performed by Dr. Dulce Sellar.  Findings: Imaging shows the scope partial injection of the mid and distal common bile duct and then a dilated pancreatic duct.  No additional images provided.  By report, a sphincterotomy was performed.  IMPRESSION: To the e r c p views as described with ERCP performed for obstructive jaundice.  These images were submitted for radiologic interpretation only. Please see the procedural report for the amount of contrast and the fluoroscopy time utilized.   Original Report Authenticated By: Amie Portland, M.D.     Review of Systems  Constitutional: Positive for weight loss.  HENT: Negative.   Eyes:       Scleral icterus   Respiratory: Negative.   Cardiovascular: Negative.   Gastrointestinal: Positive for nausea, vomiting and abdominal pain.  N/v/abd pain today.  Genitourinary: Negative.   Musculoskeletal: Negative.   Skin:       Jaundice   Neurological: Negative.   Endo/Heme/Allergies: Positive for polydipsia (had earlier, but resolved once DM treated).  Psychiatric/Behavioral: Negative.    Blood pressure 182/84, pulse 79, temperature 97.3 F (36.3 C), temperature source Oral, resp. rate 18, SpO2 98.00%. Physical Exam    Constitutional: He is oriented to person, place, and time. He appears well-developed. He appears distressed (looks uncomfortable).  HENT:  Head: Normocephalic and atraumatic.  Mouth/Throat: No oropharyngeal exudate.  Eyes: Conjunctivae normal are normal. Pupils are equal, round, and reactive to light. Right eye exhibits no discharge. Left eye exhibits no discharge. Scleral icterus is present.  Neck: Normal range of motion. Neck supple. No tracheal deviation present. No thyromegaly present.  Cardiovascular: Normal rate, regular rhythm, normal heart sounds and intact distal pulses.  Exam reveals no gallop and no friction rub.   No murmur heard. Respiratory: Effort normal and breath sounds normal. No respiratory distress. He has no wheezes. He has no rales. He exhibits no tenderness.  GI: Soft. He exhibits distension. There is tenderness (upper abdomen). There is guarding (voluntary guarding in epigastrium). There is no rebound.  Musculoskeletal: Normal range of motion.  Lymphadenopathy:    He has no cervical adenopathy.  Neurological: He is alert and oriented to person, place, and time. Coordination normal.  Skin: Skin is warm and dry. No rash noted. He is not diaphoretic. No erythema. No pallor.  Psychiatric: He has a normal mood and affect. His behavior is normal. Judgment and thought content normal.    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 Stented due to T Bili of 11. Will get chest CT to evaluate for metastatic disease.  Will also need CA 19-9.   Pt is good candidate for surgery. Discussed the disease process and surgery.  I discussed the surgery with the patient including diagrams of anatomy.  I discussed the potential for diagnostic laparoscopy.  In the case of cancer, if spread of the disease is found, we will abort the procedure and not proceed with resection.  The rationale  for this was discussed with the patient.  There has not been data to support resection of Stage IV disease in terms of survival benefit.    We discussed possible complications including: Potential of aborting procedure if tumor is invading the superior mesenteric or hepatic arteries Bleeding Infection and possible wound complications such as hernia Damage to adjacent structures Leak of anastamoses, primarily pancreatic Possible need for other procedures Possible prolonged hospital stay Possible development of diabetes or worsening of current diabetes.  Possible pancreatic exocrine insufficiency Prolonged fatigue/weakness/appetite Difficulty with eating or post operative nausea Possible early recurrence of cancer    Will work on getting surgery date. Pt may decide to get other opinion. Pt has nephew in [place].  May consider going to [facility].    >1h15 min spent in exam and counseling, conference with patient and family.  Hector Pugh 11/09/2012, 1:20 PM

## 2012-11-09 NOTE — ED Notes (Signed)
Per daughter, pt had endoscopic and ERCP with stent placed today at [place]. Reports mid-epigastric burning pain. [facility] gastroenterology who told pt to come here for eval.

## 2012-11-09 NOTE — ED Provider Notes (Signed)
History     CSN: [id number]  Arrival date & time 11/09/12  0306   First MD Initiated Contact with Patient 11/09/12 0458      Chief Complaint  Patient presents with  . Abdominal Pain    (Consider location/radiation/quality/duration/timing/severity/associated sxs/prior treatment) HPI 51 year old male presents to emergency room complaining of burning searing pain in his upper abdomen. Pain began around midnight. He has not had any nausea or vomiting. Patient had ERCP done yesterday by [facility] gastroenterology due to jaundice. Patient had stents placed in biliary duct and pancreatic duct. Patient was also diagnosed with adenocarcinoma of the pancreas. Family called Dr. Ewing Schlein on call for [facility] gastroenterology who recommended coming to the emergency room for evaluation of pancreatitis. No fevers no chills no diarrhea. Patient has not felt lightheaded or weak. Patient is in process for consult to either a local general surgery for [place] or going to outside hospital.  Past Medical History  Diagnosis Date  . Diabetes mellitus without complication   . Hypertension     Past Surgical History  Procedure Date  . Circucision   . Hernia repair     RIGHT  . Left foot surgery   . Vasectomy   . Circumsision   . Left foot surgery     History reviewed. No pertinent family history.  History  Substance Use Topics  . Smoking status: Never Smoker   . Smokeless tobacco: Never Used  . Alcohol Use: No      Review of Systems  See History of Present Illness; otherwise all other systems are reviewed and negative  Allergies  Review of patient's allergies indicates no known allergies.  Home Medications   Current Outpatient Rx  Name  Route  Sig  Dispense  Refill  . LORATADINE 10 MG PO TABS   Oral   Take 10 mg by mouth daily.         Marland Kitchen METOPROLOL SUCCINATE ER 100 MG PO TB24   Oral   Take 100 mg by mouth every morning. Take with or immediately following a meal.           BP 175/85   Pulse 66  Temp 97.8 F (36.6 C) (Oral)  Resp 20  SpO2 96%  Physical Exam  Nursing note and vitals reviewed. Constitutional: He is oriented to person, place, and time. He appears distressed (uncomfortable-appearing).  HENT:  Head: Normocephalic and atraumatic.  Eyes: Scleral icterus is present.  Neck: Normal range of motion. Neck supple. No JVD present. No tracheal deviation present. No thyromegaly present.  Pulmonary/Chest: No stridor.  Abdominal: Bowel sounds are normal. He exhibits no distension and no mass. There is tenderness (significant tenderness in epigastrium and right upper quadrant). There is rebound and guarding.  Musculoskeletal: Normal range of motion. He exhibits no edema and no tenderness.  Lymphadenopathy:    He has no cervical adenopathy.  Neurological: He is alert and oriented to person, place, and time.  Skin: Skin is warm and dry. No rash noted. No erythema. No pallor.  Psychiatric: He has a normal mood and affect. His behavior is normal. Judgment and thought content normal.    ED Course  Procedures (including critical care time)  Labs Reviewed  CBC WITH DIFFERENTIAL - Abnormal; Notable for the following:    WBC 15.8 (*)     Neutrophils Relative 79 (*)     Neutro Abs 12.5 (*)     All other components within normal limits  COMPREHENSIVE METABOLIC PANEL - Abnormal; Notable for  the following:    Glucose, Bld 217 (*)     Albumin 3.2 (*)     AST 56 (*)     ALT 60 (*)     Alkaline Phosphatase 202 (*)     Total Bilirubin 11.2 (*)     All other components within normal limits  LIPASE, BLOOD - Abnormal; Notable for the following:    Lipase >3000 (*)     All other components within normal limits  URINALYSIS, ROUTINE W REFLEX MICROSCOPIC - Abnormal; Notable for the following:    Color, Urine ORANGE (*)  BIOCHEMICALS MAY BE AFFECTED BY COLOR   Bilirubin Urine LARGE (*)     Ketones, ur 15 (*)     All other components within normal limits   Dg Ercp With  Sphincterotomy  11/08/2012  *RADIOLOGY REPORT*  Clinical Data: ERCP with sphincterotomy  ERCP two views from an ERCP were submitted  Comparison:  Abdomen and pelvis CT, 11/04/2012  Technique:  Multiple spot images obtained with the fluoroscopic device and submitted for interpretation post-procedure.  ERCP was performed by Dr. Dulce Sellar.  Findings: Imaging shows the scope partial injection of the mid and distal common bile duct and then a dilated pancreatic duct.  No additional images provided.  By report, a sphincterotomy was performed.  IMPRESSION: To the e r c p views as described with ERCP performed for obstructive jaundice.  These images were submitted for radiologic interpretation only. Please see the procedural report for the amount of contrast and the fluoroscopy time utilized.   Original Report Authenticated By: Amie Portland, M.D.      1. Pancreatitis       MDM  51 year old male with acute pancreatitis after ERCP yesterday. Discussed with Dr. Ewing Schlein with gastroenterology who will the patient. Given history of diabetes, he requests medicine consult for management of blood sugars. I will place holding orders, and give Cipro and Flagyl and pain medicine per his recommendations.  He should and family updated on findings and plan        Olivia Mackie, MD 11/09/12 [id number]

## 2012-11-09 NOTE — Consult Note (Signed)
[facility] Medical Consultation  Evren Shankland ZOX:[id number] DOB: 26-Dec-1960 DOA: 11/09/2012 PCP: Karle Plumber, MD   Requesting physician: Dr. Ewing Schlein Date of consultation: 11/09/12 Reason for consultation: Diabetes and HTN management  Impression/Recommendations 1-Abd pain/Acute pancreatitis: most likely secondary to ERCP. Treatment per primary team.   2-DM type 2: has been w/o medications since 11/04/12; used to be on metformin prior to that and reports good medication compliance and no complications from his diabetes.  -At this moment will check A1C -Start SSI and close CBG monitoring (especially since patient will be NPO -adjust treatment according to CBG's fluctuation  3-HTN:patient on metoprolol at home. Will hold now due to NPO status. Will use PRN hydralazine if BP >185. Pain playing at role in his elevated BP. Will need better analgesics.  I will followup again tomorrow. Please contact me if I can be of assistance in the meanwhile. Thank you for this consultation.  Chief Complaint: abdominal pain  HPI:  51 y/o male with PMH of DM type, HTN and obstructive jaundice 2/2 presumed obstructive head of pancreas cancer, who on 11/08/12 went to [facility] hospital endoscopy unit for ERCP and stent placement and since then has been experiencing excruciating pain in his abdomen. Patient in ED found with elevated Lipase >3000, blood sugar of 217, leukocytosis and transaminitis. Case discussed with GI group who performed ERCP and they will admit patient for further evaluation and treatment and has requested [facility] assistance with management of his diabetes. Patient denies CP, fever, chills, cough, dysuria, nausea or vomiting, melena or any other acute complaint.  Review of Systems:  Negative except as otherwise mentioned on HPI.  Past Medical History  Diagnosis Date  . Diabetes mellitus without complication   . Hypertension    Past Surgical History  Procedure Date  . Circucision   .  Hernia repair     RIGHT  . Left foot surgery   . Vasectomy   . Circumsision   . Left foot surgery    Social History:  reports that he has never smoked. He has never used smokeless tobacco. He reports that he does not drink alcohol or use illicit drugs.  No Known Allergies  Family History: reviewed. Per patient only significant for DM, no other pertinent family history.  Prior to Admission medications   Medication Sig Start Date End Date Taking? Authorizing Provider  loratadine (CLARITIN) 10 MG tablet Take 10 mg by mouth daily.   Yes Historical Provider, MD  metoprolol succinate (TOPROL-XL) 100 MG 24 hr tablet Take 100 mg by mouth every morning. Take with or immediately following a meal.   Yes Historical Provider, MD   Physical Exam: Blood pressure 175/85, pulse 66, temperature 97.8 F (36.6 C), temperature source Oral, resp. rate 20, SpO2 96.00%. Filed Vitals:   11/09/12 0313 11/09/12 0707  BP: 144/84 175/85  Pulse: 60 66  Temp:  97.8 F (36.6 C)  TempSrc:  Oral  Resp: 20 20  SpO2: 98% 96%     General:  Acute distress secondary to abdominal pain; jaundice  Eyes: positive icterus, no nystagmus, EOMI and PERRLA  ENT: dry MM, no erythema or exudates inside his mout  Neck: supple, no thyromegaly, no bruits  Cardiovascular: S1 and S2, no rubs, no murmurs, no gallops  Respiratory: no wheezing; good air movement; mild shallow breathing due to pain worsening in his abd with deep inspiration  Abdomen: epigastric pain, radiated to his back, no guarding, positive BS, soft  Skin: jaundice  Musculoskeletal: no joint swelling  or erythema, FROM and no edema on his LE's  Psychiatric: appropriate  Neurologic: CN intact, AAOX3, no focal motor or sensory deficit  Labs on Admission:  Basic Metabolic Panel:  Lab 11/09/12 1610 11/04/12 1650  NA 137 137  K 3.7 3.7  CL 98 104  CO2 24 22  GLUCOSE 217* 152*  BUN 10 15  CREATININE 0.78 0.60  CALCIUM 9.8 9.4  MG -- --  PHOS --  --   Liver Function Tests:  Lab 11/09/12 0353 11/04/12 1650  AST 56* 56*  ALT 60* 72*  ALKPHOS 202* 189*  BILITOT 11.2* 11.0*  PROT 7.3 7.3  ALBUMIN 3.2* 3.3*    Lab 11/09/12 0353 11/04/12 1650  LIPASE >3000* 784*  AMYLASE -- --   CBC:  Lab 11/09/12 0353 11/04/12 1650  WBC 15.8* 7.7  NEUTROABS 12.5* 5.1  HGB 14.1 12.7*  HCT 41.8 36.6*  MCV 88.6 87.1  PLT 324 228   CBG:  Lab 11/08/12 1424 11/08/12 1027  GLUCAP 118* 113*    Radiological Exams on Admission: Dg Ercp With Sphincterotomy  11/08/2012  *RADIOLOGY REPORT*  Clinical Data: ERCP with sphincterotomy  ERCP two views from an ERCP were submitted  Comparison:  Abdomen and pelvis CT, 11/04/2012  Technique:  Multiple spot images obtained with the fluoroscopic device and submitted for interpretation post-procedure.  ERCP was performed by Dr. Dulce Sellar.  Findings: Imaging shows the scope partial injection of the mid and distal common bile duct and then a dilated pancreatic duct.  No additional images provided.  By report, a sphincterotomy was performed.  IMPRESSION: To the e r c p views as described with ERCP performed for obstructive jaundice.  These images were submitted for radiologic interpretation only. Please see the procedural report for the amount of contrast and the fluoroscopy time utilized.   Original Report Authenticated By: Amie [place], M.D.     Time spent: >30 minutes  Javen Ridings [facility] Pager [phone]  If 7PM-7AM, please contact night-coverage [email or web address] Password [email or web address] 11/09/2012, 8:02 AM

## 2012-11-10 ENCOUNTER — Inpatient Hospital Stay (HOSPITAL_COMMUNITY): Payer: BC Managed Care – PPO

## 2012-11-10 ENCOUNTER — Encounter (HOSPITAL_COMMUNITY): Payer: Self-pay | Admitting: Internal Medicine

## 2012-11-10 DIAGNOSIS — K859 Acute pancreatitis without necrosis or infection, unspecified: Secondary | ICD-10-CM | POA: Diagnosis present

## 2012-11-10 DIAGNOSIS — E119 Type 2 diabetes mellitus without complications: Secondary | ICD-10-CM

## 2012-11-10 DIAGNOSIS — I10 Essential (primary) hypertension: Secondary | ICD-10-CM

## 2012-11-10 DIAGNOSIS — R109 Unspecified abdominal pain: Secondary | ICD-10-CM | POA: Diagnosis present

## 2012-11-10 HISTORY — DX: Essential (primary) hypertension: I10

## 2012-11-10 HISTORY — DX: Acute pancreatitis without necrosis or infection, unspecified: K85.90

## 2012-11-10 HISTORY — DX: Type 2 diabetes mellitus without complications: E11.9

## 2012-11-10 LAB — CBC WITH DIFFERENTIAL/PLATELET
Basophils Absolute: 0 10*3/uL (ref 0.0–0.1)
HCT: 39.4 % (ref 39.0–52.0)
Lymphs Abs: 1.3 10*3/uL (ref 0.7–4.0)
MCH: 30 pg (ref 26.0–34.0)
MCHC: 33.8 g/dL (ref 30.0–36.0)
MCV: 88.7 fL (ref 78.0–100.0)
Monocytes Absolute: 0.9 10*3/uL (ref 0.1–1.0)
Monocytes Relative: 6 % (ref 3–12)
Neutro Abs: 12 10*3/uL — ABNORMAL HIGH (ref 1.7–7.7)
Platelets: 200 10*3/uL (ref 150–400)
RDW: 14.6 % (ref 11.5–15.5)

## 2012-11-10 LAB — COMPREHENSIVE METABOLIC PANEL
ALT: 39 U/L (ref 0–53)
AST: 32 U/L (ref 0–37)
CO2: 25 mEq/L (ref 19–32)
Calcium: 9 mg/dL (ref 8.4–10.5)
Creatinine, Ser: 0.7 mg/dL (ref 0.50–1.35)
GFR calc Af Amer: >90 mL/min (ref >90)
GFR calc non Af Amer: >90 mL/min (ref >90)
Sodium: 136 mEq/L (ref 135–145)
Total Protein: 6.8 g/dL (ref 6.0–8.3)

## 2012-11-10 LAB — GLUCOSE, CAPILLARY
Glucose-Capillary: 210 mg/dL — ABNORMAL HIGH (ref 70–99)
Glucose-Capillary: 146 mg/dL — ABNORMAL HIGH (ref 70–99)
Glucose-Capillary: 124 mg/dL — ABNORMAL HIGH (ref 70–99)

## 2012-11-10 MED ORDER — SODIUM CHLORIDE 0.9 % IV SOLN
12.5000 mg | Freq: Three times a day (TID) | INTRAVENOUS | Status: DC | PRN
  Administered 2012-11-10: 12.5 mg via INTRAVENOUS
  Filled 2012-11-10: qty 0.5

## 2012-11-10 MED ORDER — HYDROMORPHONE HCL PF 1 MG/ML IJ SOLN
1.0000 mg | INTRAMUSCULAR | Status: DC | PRN
  Administered 2012-11-10 – 2012-11-14 (×28): 1 mg via INTRAVENOUS
  Filled 2012-11-10 (×29): qty 1

## 2012-11-10 MED ORDER — IOHEXOL 300 MG/ML  SOLN
80.0000 mL | Freq: Once | INTRAMUSCULAR | Status: AC | PRN
  Administered 2012-11-10: 80 mL via INTRAVENOUS

## 2012-11-10 MED ORDER — POTASSIUM CHLORIDE 2 MEQ/ML IV SOLN: INTRAVENOUS | Status: DC

## 2012-11-10 MED ORDER — KCL IN DEXTROSE-NACL 40-5-0.45 MEQ/L-%-% IV SOLN
INTRAVENOUS | Status: DC
  Administered 2012-11-10 – 2012-11-11 (×2): via INTRAVENOUS
  Administered 2012-11-11: 1000 mL via INTRAVENOUS
  Administered 2012-11-12 – 2012-11-14 (×5): via INTRAVENOUS
  Administered 2012-11-14: 1000 mL via INTRAVENOUS
  Administered 2012-11-15: 10:00:00 via INTRAVENOUS
  Filled 2012-11-10 (×14): qty 1000

## 2012-11-10 NOTE — Progress Notes (Signed)
[facility] PROGRESS NOTE/CONSULT  Hector Pugh WUJ:[id number] DOB: 04-26-1961 DOA: 11/09/2012 PCP: Karle Plumber, MD  Assessment/Plan:  #1 well-controlled type 2 diabetes Has been diet-controlled since 11/04/2012 however prior to that was on metformin. Hemoglobin A1c 6.4. CBGs have ranged from 118 - 155. Continue sliding scale insulin and follow.  #2 hypertension Blood pressure is stable. Continue to hold off on oral antihypertensive agents, as patient is currently n.p.o. Hydralazine as needed.  #3 acute ERCP induced pancreatitis Primary team and general surgery.  Code Status: Full Family Communication: Updated patient and wife at bedside Disposition Plan: Per primary team    Procedures:  CT chest 11/10/2012  Antibiotics:  IV Cipro 11/09/2012  IV Flagyl 11/09/2012  HPI/Subjective: Patient complaining of abdominal pain.  Objective: Filed Vitals:   11/10/12 0300 11/10/12 0711 11/10/12 1027 11/10/12 1443  BP: 138/85 128/77 134/89 134/87  Pulse:   88 95  Temp: 97.6 F (36.4 C) 98 F (36.7 C) 97.5 F (36.4 C) 97.6 F (36.4 C)  TempSrc: Oral Oral Oral Oral  Resp: 20 20 18 18   Height:      Weight:      SpO2: 100% 100% 100% 98%    Intake/Output Summary (Last 24 hours) at 11/10/12 1707 Last data filed at 11/09/12 2331  Gross per 24 hour  Intake    400 ml  Output      0 ml  Net    400 ml   Filed Weights   11/09/12 1100  Weight: 68.04 kg (150 lb)    Exam:   General:  NAD  Cardiovascular: RRR. No LE edema  Respiratory: CTAB  Abdomen: SOFT/ TTP in epigastrium/+BS  Data Reviewed: Basic Metabolic Panel:  Lab 11/10/12 9562 11/09/12 0353 11/04/12 1650  NA 136 137 137  K 3.2* 3.7 3.7  CL 98 98 104  CO2 25 24 22   GLUCOSE 147* 217* 152*  BUN 14 10 15   CREATININE 0.70 0.78 0.60  CALCIUM 9.0 9.8 9.4  MG -- -- --  PHOS -- -- --   Liver Function Tests:  Lab 11/10/12 0635 11/09/12 0353 11/04/12 1650  AST 32 56* 56*  ALT 39 60* 72*   ALKPHOS 148* 202* 189*  BILITOT 6.7* 11.2* 11.0*  PROT 6.8 7.3 7.3  ALBUMIN 2.8* 3.2* 3.3*    Lab 11/10/12 0635 11/09/12 0353 11/04/12 1650  LIPASE 603* >3000* 784*  AMYLASE -- -- --   No results found for this basename: AMMONIA:5 in the last 168 hours CBC:  Lab 11/10/12 0635 11/09/12 0353 11/04/12 1650  WBC 14.2* 15.8* 7.7  NEUTROABS 12.0* 12.5* 5.1  HGB 13.3 14.1 12.7*  HCT 39.4 41.8 36.6*  MCV 88.7 88.6 87.1  PLT 200 324 228   Cardiac Enzymes: No results found for this basename: CKTOTAL:5,CKMB:5,CKMBINDEX:5,TROPONINI:5 in the last 168 hours BNP (last 3 results) No results found for this basename: PROBNP:3 in the last 8760 hours CBG:  Lab 11/10/12 1616 11/10/12 1158 11/10/12 0821 11/10/12 0503 11/10/12 0110  GLUCAP 124* 118* 155* 146* 210*    No results found for this or any previous visit (from the past 240 hour(s)).   Studies: Ct Chest W Contrast  11/10/2012  *RADIOLOGY REPORT*  Clinical Data: Evaluate for metastatic disease.  CT CHEST WITH CONTRAST  Technique:  Multidetector CT imaging of the chest was performed following the standard protocol during bolus administration of intravenous contrast.  Contrast: 80mL OMNIPAQUE IOHEXOL 300 MG/ML  SOLN  Comparison: Chest radiograph, 09/21/2010.  Abdomen and pelvis CT, 11/04/2012  Findings: There is  dependent, predominate lower lobe, coarse reticular opacity that is consistent with atelectasis.  The lungs otherwise clear with no masses or nodules.  There are no pleural effusions and no pneumothorax.  The heart is normal in size and configuration.  No mediastinal or hilar masses.  There are scattered sub centimeter neck base and mediastinal lymph nodes none of which are pathologically enlarged by size criteria.  No axillary masses or adenopathy.  Limited evaluation of the upper abdomen shows some stranding and fluid attenuation adjacent to the gastric cardia and biliary air centrally in the liver as well as fatty infiltration of the  liver.  There is no significant bony abnormality.  No osteoblastic or osteolytic lesions.  IMPRESSION: No evidence of metastatic disease to the chest.  There are mildly prominent left neck base and mediastinal lymph nodes, but none of these are enlarged by size criteria.  There are presumed to be reactive.  Dependent, primarily lower lobe, atelectasis.   Original Report Authenticated By: Amie [place], M.D.     Scheduled Meds:    . [COMPLETED] sodium chloride   Intravenous STAT  . ciprofloxacin  400 mg Intravenous Q12H  . fentaNYL  25 mcg Transdermal Q72H  . insulin aspart  0-9 Units Subcutaneous Q4H  . metronidazole  500 mg Intravenous Q8H  . pantoprazole (PROTONIX) IV  40 mg Intravenous Q24H   Continuous Infusions:    . [EXPIRED] sodium chloride 100 mL/hr at 11/09/12 1234  . dextrose 5 % and 0.45 % NaCl with KCl 40 mEq/L 100 mL/hr at 11/10/12 1429  . [DISCONTINUED] dextrose 5 %-0.45% NaCl with KCl Pediatric custom IV fluid      Principal Problem:  *Pancreatitis Active Problems:  Diabetes mellitus  HTN (hypertension)  Abdominal pain    Time spent: > 35 mins     [facility] [place]  [facility] Pager [phone]. If 8PM-8AM, please contact night-coverage at [email or web address], password [facility] 11/10/2012, 5:07 PM  LOS: 1 day

## 2012-11-10 NOTE — Progress Notes (Signed)
  Subjective: Pt still having some abdominal pain.  Sl better.    Objective: Vital signs in last 24 hours: Temp:  [97 F (36.1 C)-97.9 F (36.6 C)] 97.6 F (36.4 C) (12/08 0300) Pulse Rate:  [66-88] 88  (12/07 2203) Resp:  [18-20] 20  (12/08 0300) BP: (138-194)/(84-107) 138/85 mmHg (12/08 0300) SpO2:  [96 %-100 %] 100 % (12/08 0300) Weight:  [150 lb (68.04 kg)] 150 lb (68.04 kg) (12/07 1100) Last BM Date:  (PTA)  Intake/Output from previous day: 12/07 0701 - 12/08 0700 In: 400 [IV Piggyback:400] Out: -  Intake/Output this shift: Total I/O In: 400 [IV Piggyback:400] Out: -   General appearance: alert, cooperative and no distress Eyes: icteric sclera GI: soft, sl distended, tender in epigastrium Extremities: extremities normal, atraumatic, no cyanosis or edema  Lab Results:   [facility] 11/09/12 0353  WBC 15.8*  HGB 14.1  HCT 41.8  PLT 324   BMET  Basename 11/09/12 0353  NA 137  K 3.7  CL 98  CO2 24  GLUCOSE 217*  BUN 10  CREATININE 0.78  CALCIUM 9.8   PT/INR No results found for this basename: LABPROT:2,INR:2 in the last 72 hours ABG No results found for this basename: PHART:2,PCO2:2,PO2:2,HCO3:2 in the last 72 hours  Studies/Results: Dg Ercp With Sphincterotomy  11/08/2012  *RADIOLOGY REPORT*  Clinical Data: ERCP with sphincterotomy  ERCP two views from an ERCP were submitted  Comparison:  Abdomen and pelvis CT, 11/04/2012  Technique:  Multiple spot images obtained with the fluoroscopic device and submitted for interpretation post-procedure.  ERCP was performed by Dr. Dulce Sellar.  Findings: Imaging shows the scope partial injection of the mid and distal common bile duct and then a dilated pancreatic duct.  No additional images provided.  By report, a sphincterotomy was performed.  IMPRESSION: To the e r c p views as described with ERCP performed for obstructive jaundice.  These images were submitted for radiologic interpretation only. Please see the procedural  report for the amount of contrast and the fluoroscopy time utilized.   Original Report Authenticated By: Amie [place], M.D.     Anti-infectives: Anti-infectives     Start     Dose/Rate Route Frequency Ordered Stop   11/09/12 0845   ciprofloxacin (CIPRO) IVPB 400 mg        400 mg 200 mL/hr over 60 Minutes Intravenous Every 12 hours 11/09/12 0841     11/09/12 0845   metroNIDAZOLE (FLAGYL) IVPB 500 mg        500 mg 100 mL/hr over 60 Minutes Intravenous Every 8 hours 11/09/12 0841            Assessment/Plan: s/p * No surgery found * Chest CT today.   CA 19-9 Schedule whipple as long as no metastatic disease.    LOS: 1 day    [facility] 11/10/2012

## 2012-11-10 NOTE — Progress Notes (Signed)
Patient c/o upper left epigastric pain with hiccups. Noted patient moans loudly during hiccups. Dr. Ewing Schlein notified and ordered Dilaudid for pain and Thorazine for hiccups. Orders carried out.

## 2012-11-10 NOTE — Progress Notes (Signed)
[place] 1:35 PM  Subjective: The patient is doing a little better and is walking fine and no nausea or vomiting and requiring decrease pain medicine and did like meeting the surgeon and has no new complaints Objective: Vital signs stable afebrile no acute distress abdomen is still little tender in the midepigastric area but soft nontender else rare bowel sounds labs improved chest CT okay  Assessment: Post ERCP pancreatitis  Plan: I offered him to increase the patch but he preferred not to and if he continues to improve will probably try clear liquids tomorrow and possibly add pancreatic enzyme as well and all of the patient and his family's questions were answered  [facility]

## 2012-11-11 ENCOUNTER — Other Ambulatory Visit (INDEPENDENT_AMBULATORY_CARE_PROVIDER_SITE_OTHER): Payer: Self-pay | Admitting: General Surgery

## 2012-11-11 ENCOUNTER — Encounter (HOSPITAL_COMMUNITY): Payer: Self-pay | Admitting: Gastroenterology

## 2012-11-11 DIAGNOSIS — K831 Obstruction of bile duct: Secondary | ICD-10-CM | POA: Diagnosis present

## 2012-11-11 HISTORY — DX: Obstruction of bile duct: K83.1

## 2012-11-11 LAB — CBC WITH DIFFERENTIAL/PLATELET
Hemoglobin: 12.5 g/dL — ABNORMAL LOW (ref 13.0–17.0)
Lymphocytes Relative: 15 % (ref 12–46)
Lymphs Abs: 2.1 10*3/uL (ref 0.7–4.0)
Monocytes Relative: 8 % (ref 3–12)
Neutrophils Relative %: 77 % (ref 43–77)
Platelets: 220 10*3/uL (ref 150–400)
RBC: 4.19 MIL/uL — ABNORMAL LOW (ref 4.22–5.81)
WBC: 14.7 10*3/uL — ABNORMAL HIGH (ref 4.0–10.5)

## 2012-11-11 LAB — COMPREHENSIVE METABOLIC PANEL
ALT: 30 U/L (ref 0–53)
Alkaline Phosphatase: 121 U/L — ABNORMAL HIGH (ref 39–117)
BUN: 12 mg/dL (ref 6–23)
CO2: 28 mEq/L (ref 19–32)
Chloride: 98 mEq/L (ref 96–112)
GFR calc Af Amer: >90 mL/min (ref >90)
GFR calc non Af Amer: >90 mL/min (ref >90)
Glucose, Bld: 135 mg/dL — ABNORMAL HIGH (ref 70–99)
Potassium: 3.8 mEq/L (ref 3.5–5.1)
Sodium: 133 mEq/L — ABNORMAL LOW (ref 135–145)
Total Bilirubin: 5.2 mg/dL — ABNORMAL HIGH (ref 0.3–1.2)

## 2012-11-11 LAB — GLUCOSE, CAPILLARY
Glucose-Capillary: 137 mg/dL — ABNORMAL HIGH (ref 70–99)
Glucose-Capillary: 148 mg/dL — ABNORMAL HIGH (ref 70–99)

## 2012-11-11 NOTE — Progress Notes (Signed)
  Subjective: Appears comfortable this morning, no further questions at this time, offers no c/o.   Objective: Vital signs in last 24 hours: Temp:  [97.5 F (36.4 C)-98.7 F (37.1 C)] 98.5 F (36.9 C) (12/09 0601) Pulse Rate:  [88-109] 94  (12/09 0601) Resp:  [17-18] 17  (12/09 0601) BP: (119-134)/(70-89) 119/70 mmHg (12/09 0601) SpO2:  [97 %-100 %] 97 % (12/09 0601) Last BM Date:  (PTA)  Intake/Output from previous day:   Intake/Output this shift:    General appearance: alert, cooperative, appears stated age and no distress Chest: CTA Cardiac: RRR Abdomen: no distention or pain, + BS Extremities; warm to touch, + pulses, no edema or tenderness. VSS, afebrile  BMET  Basename 11/11/12 0650 11/10/12 0635  NA 133* 136  K 3.8 3.2*  CL 98 98  CO2 28 25  GLUCOSE 135* 147*  BUN 12 14  CREATININE 0.76 0.70  CALCIUM 8.8 9.0   PT/INR No results found for this basename: LABPROT:2,INR:2 in the last 72 hours ABG No results found for this basename: PHART:2,PCO2:2,PO2:2,HCO3:2 in the last 72 hours  Studies/Results: Ct Chest W Contrast  11/10/2012  *RADIOLOGY REPORT*  Clinical Data: Evaluate for metastatic disease.  CT CHEST WITH CONTRAST  Technique:  Multidetector CT imaging of the chest was performed following the standard protocol during bolus administration of intravenous contrast.  Contrast: 80mL OMNIPAQUE IOHEXOL 300 MG/ML  SOLN  Comparison: Chest radiograph, 09/21/2010.  Abdomen and pelvis CT, 11/04/2012  Findings: There is  dependent, predominate lower lobe, coarse reticular opacity that is consistent with atelectasis.  The lungs otherwise clear with no masses or nodules.  There are no pleural effusions and no pneumothorax.  The heart is normal in size and configuration.  No mediastinal or hilar masses.  There are scattered sub centimeter neck base and mediastinal lymph nodes none of which are pathologically enlarged by size criteria.  No axillary masses or adenopathy.   Limited evaluation of the upper abdomen shows some stranding and fluid attenuation adjacent to the gastric cardia and biliary air centrally in the liver as well as fatty infiltration of the liver.  There is no significant bony abnormality.  No osteoblastic or osteolytic lesions.  IMPRESSION: No evidence of metastatic disease to the chest.  There are mildly prominent left neck base and mediastinal lymph nodes, but none of these are enlarged by size criteria.  There are presumed to be reactive.  Dependent, primarily lower lobe, atelectasis.   Original Report Authenticated By: Amie [place], M.D.     Anti-infectives: Anti-infectives     Start     Dose/Rate Route Frequency Ordered Stop   11/09/12 0845   ciprofloxacin (CIPRO) IVPB 400 mg        400 mg 200 mL/hr over 60 Minutes Intravenous Every 12 hours 11/09/12 0841     11/09/12 0845   metroNIDAZOLE (FLAGYL) IVPB 500 mg        500 mg 100 mL/hr over 60 Minutes Intravenous Every 8 hours 11/09/12 0841            Assessment/Plan: s/p * No surgery found * Management per GI Will plan for Whipple procedure (per Dr. Donell Beers)   LOS: 2 days    Blenda Mounts ACNP [facility] Pager # [phone]  11/11/2012

## 2012-11-11 NOTE — Progress Notes (Signed)
No acute issues.

## 2012-11-11 NOTE — Progress Notes (Signed)
Subjective: Epigastric pain slowly improving (8/10 admission, 5/10 now). No bowel movement (not eating x 3 days). On sips ice chips.  Objective: Vital signs in last 24 hours: Temp:  [97.6 F (36.4 C)-98.7 F (37.1 C)] 98.5 F (36.9 C) (12/09 1032) Pulse Rate:  [94-109] 95  (12/09 1032) Resp:  [17-18] 18  (12/09 1032) BP: (110-134)/(70-87) 110/74 mmHg (12/09 1032) SpO2:  [97 %-98 %] 97 % (12/09 1032) Weight change:  Last BM Date:  (PTA)  PE: GEN:  NAD ABD:  Mild-to-moderate epigastric tenderness; mild guarding; no peritonitis  Lab Results: CBC    Component Value Date/Time   WBC 14.7* 11/11/2012 0650   RBC 4.19* 11/11/2012 0650   HGB 12.5* 11/11/2012 0650   HCT 37.6* 11/11/2012 0650   PLT 220 11/11/2012 0650   MCV 89.7 11/11/2012 0650   MCH 29.8 11/11/2012 0650   MCHC 33.2 11/11/2012 0650   RDW 14.5 11/11/2012 0650   LYMPHSABS 2.1 11/11/2012 0650   MONOABS 1.1* 11/11/2012 0650   EOSABS 0.1 11/11/2012 0650   BASOSABS 0.0 11/11/2012 0650   CMP     Component Value Date/Time   NA 133* 11/11/2012 0650   K 3.8 11/11/2012 0650   CL 98 11/11/2012 0650   CO2 28 11/11/2012 0650   GLUCOSE 135* 11/11/2012 0650   BUN 12 11/11/2012 0650   CREATININE 0.76 11/11/2012 0650   CALCIUM 8.8 11/11/2012 0650   PROT 6.4 11/11/2012 0650   ALBUMIN 2.5* 11/11/2012 0650   AST 29 11/11/2012 0650   ALT 30 11/11/2012 0650   ALKPHOS 121* 11/11/2012 0650   BILITOT 5.2* 11/11/2012 0650   GFRNONAA >90 11/11/2012 0650   GFRAA >90 11/11/2012 0650   Assessment:  1.  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 2.  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 3.  Post-ERCP pancreatitis, slowly improving.  Plan:  1.  Continue IVF and analgesics (Fentanyl patch with hydromorphone IV for breakthrough symptoms). 2.  Advance diet to sips clears. 3.  Awaiting final cytology results (likely to be resulted in next 24-48  hours). 4.  Once pancreatitis resolves, will ultimately require Whipple's procedure. 5.  Follow electrolytes and liver tests. 6.  Will follow.   Hector Pugh 11/11/2012, 12:32 PM

## 2012-11-11 NOTE — Progress Notes (Signed)
Patient ID: Hector Pugh, male   DOB: 1961/09/11, 51 y.o.   MRN: [id number]    Subjective: Significant improvement in symptoms.  Objective: Vital signs in last 24 hours: Temp:  [98.2 F (36.8 C)-98.7 F (37.1 C)] 98.4 F (36.9 C) (12/09 1451) Pulse Rate:  [92-109] 92  (12/09 1451) Resp:  [17-18] 18  (12/09 1451) BP: (110-127)/(70-80) 124/80 mmHg (12/09 1451) SpO2:  [97 %-98 %] 98 % (12/09 1451) Last BM Date:  (PTA)  Intake/Output from previous day:   Intake/Output this shift:    General appearance: alert, cooperative and no distress Eyes: icteric sclera GI: soft, less distended. Extremities: extremities normal, atraumatic, no cyanosis or edema  Lab Results:   [facility] 11/11/12 0650 11/10/12 0635  WBC 14.7* 14.2*  HGB 12.5* 13.3  HCT 37.6* 39.4  PLT 220 200   BMET  Basename 11/11/12 0650 11/10/12 0635  NA 133* 136  K 3.8 3.2*  CL 98 98  CO2 28 25  GLUCOSE 135* 147*  BUN 12 14  CREATININE 0.76 0.70  CALCIUM 8.8 9.0   PT/INR No results found for this basename: LABPROT:2,INR:2 in the last 72 hours ABG No results found for this basename: PHART:2,PCO2:2,PO2:2,HCO3:2 in the last 72 hours  Studies/Results: Ct Chest W Contrast  11/10/2012  *RADIOLOGY REPORT*  Clinical Data: Evaluate for metastatic disease.  CT CHEST WITH CONTRAST  Technique:  Multidetector CT imaging of the chest was performed following the standard protocol during bolus administration of intravenous contrast.  Contrast: 80mL OMNIPAQUE IOHEXOL 300 MG/ML  SOLN  Comparison: Chest radiograph, 09/21/2010.  Abdomen and pelvis CT, 11/04/2012  Findings: There is  dependent, predominate lower lobe, coarse reticular opacity that is consistent with atelectasis.  The lungs otherwise clear with no masses or nodules.  There are no pleural effusions and no pneumothorax.  The heart is normal in size and configuration.  No mediastinal or hilar masses.  There are scattered sub centimeter neck base and mediastinal lymph  nodes none of which are pathologically enlarged by size criteria.  No axillary masses or adenopathy.  Limited evaluation of the upper abdomen shows some stranding and fluid attenuation adjacent to the gastric cardia and biliary air centrally in the liver as well as fatty infiltration of the liver.  There is no significant bony abnormality.  No osteoblastic or osteolytic lesions.  IMPRESSION: No evidence of metastatic disease to the chest.  There are mildly prominent left neck base and mediastinal lymph nodes, but none of these are enlarged by size criteria.  There are presumed to be reactive.  Dependent, primarily lower lobe, atelectasis.   Original Report Authenticated By: Amie [place], M.D.     Anti-infectives: Anti-infectives     Start     Dose/Rate Route Frequency Ordered Stop   11/09/12 0845   ciprofloxacin (CIPRO) IVPB 400 mg        400 mg 200 mL/hr over 60 Minutes Intravenous Every 12 hours 11/09/12 0841     11/09/12 0845   metroNIDAZOLE (FLAGYL) IVPB 500 mg        500 mg 100 mL/hr over 60 Minutes Intravenous Every 8 hours 11/09/12 0841            Assessment/Plan: s/p * No surgery found * CT negative for metastatic disease.   CA 19-9 pending.   Bili improving. Pancreatitis clinically improving.  GI to advance diet.     LOS: 2 days    [facility] 11/11/2012

## 2012-11-11 NOTE — Progress Notes (Signed)
[facility] PROGRESS NOTE/CONSULT  Andrewjames Weirauch WJX:[id number] DOB: Nov 25, 1961 DOA: 11/09/2012 PCP: Karle Plumber, MD  Assessment/Plan:  #1 well-controlled type 2 diabetes Has been diet-controlled since 11/04/2012 however prior to that was on metformin. Hemoglobin A1c 6.4. CBGs have ranged from 137-148. Continue sliding scale insulin and follow.  #2 hypertension Blood pressure is stable. Continue to hold off on oral antihypertensive agents. Hydralazine as needed.  #3 acute ERCP induced pancreatitis Per Primary team and general surgery.  Code Status: Full Family Communication: Updated patient and wife at bedside Disposition Plan: Per primary team    Procedures:  CT chest 11/10/2012  Antibiotics:  IV Cipro 11/09/2012  IV Flagyl 11/09/2012  HPI/Subjective: Patient states abdominal pain is slowly improving.  Objective: Filed Vitals:   11/10/12 2204 11/11/12 0228 11/11/12 0601 11/11/12 1032  BP: 123/77 122/76 119/70 110/74  Pulse: 109 98 94 95  Temp: 98.3 F (36.8 C) 98.7 F (37.1 C) 98.5 F (36.9 C) 98.5 F (36.9 C)  TempSrc: Oral Oral Oral Oral  Resp: 18 17 17 18   Height:      Weight:      SpO2: 97% 97% 97% 97%   No intake or output data in the 24 hours ending 11/11/12 1452 Filed Weights   11/09/12 1100  Weight: 68.04 kg (150 lb)    Exam:   General:  NAD  Cardiovascular: RRR. No LE edema  Respiratory: Minimal basilar crackles. No wheezing  Abdomen: SOFT/ TTP in epigastrium/+BS  Data Reviewed: Basic Metabolic Panel:  Lab 11/11/12 2130 11/10/12 0635 11/09/12 0353 11/04/12 1650  NA 133* 136 137 137  K 3.8 3.2* 3.7 3.7  CL 98 98 98 104  CO2 28 25 24 22   GLUCOSE 135* 147* 217* 152*  BUN 12 14 10 15   CREATININE 0.76 0.70 0.78 0.60  CALCIUM 8.8 9.0 9.8 9.4  MG -- -- -- --  PHOS -- -- -- --   Liver Function Tests:  Lab 11/11/12 0650 11/10/12 0635 11/09/12 0353 11/04/12 1650  AST 29 32 56* 56*  ALT 30 39 60* 72*  ALKPHOS 121* 148*  202* 189*  BILITOT 5.2* 6.7* 11.2* 11.0*  PROT 6.4 6.8 7.3 7.3  ALBUMIN 2.5* 2.8* 3.2* 3.3*    Lab 11/10/12 0635 11/09/12 0353 11/04/12 1650  LIPASE 603* >3000* 784*  AMYLASE -- -- --   No results found for this basename: AMMONIA:5 in the last 168 hours CBC:  Lab 11/11/12 0650 11/10/12 0635 11/09/12 0353 11/04/12 1650  WBC 14.7* 14.2* 15.8* 7.7  NEUTROABS 11.3* 12.0* 12.5* 5.1  HGB 12.5* 13.3 14.1 12.7*  HCT 37.6* 39.4 41.8 36.6*  MCV 89.7 88.7 88.6 87.1  PLT 220 200 324 228   Cardiac Enzymes: No results found for this basename: CKTOTAL:5,CKMB:5,CKMBINDEX:5,TROPONINI:5 in the last 168 hours BNP (last 3 results) No results found for this basename: PROBNP:3 in the last 8760 hours CBG:  Lab 11/11/12 1138 11/11/12 0801 11/11/12 0358 11/10/12 2358 11/10/12 2022  GLUCAP 148* 137* 145* 128* 136*    No results found for this or any previous visit (from the past 240 hour(s)).   Studies: Ct Chest W Contrast  11/10/2012  *RADIOLOGY REPORT*  Clinical Data: Evaluate for metastatic disease.  CT CHEST WITH CONTRAST  Technique:  Multidetector CT imaging of the chest was performed following the standard protocol during bolus administration of intravenous contrast.  Contrast: 80mL OMNIPAQUE IOHEXOL 300 MG/ML  SOLN  Comparison: Chest radiograph, 09/21/2010.  Abdomen and pelvis CT, 11/04/2012  Findings: There is  dependent, predominate lower lobe, coarse reticular opacity that is consistent with atelectasis.  The lungs otherwise clear with no masses or nodules.  There are no pleural effusions and no pneumothorax.  The heart is normal in size and configuration.  No mediastinal or hilar masses.  There are scattered sub centimeter neck base and mediastinal lymph nodes none of which are pathologically enlarged by size criteria.  No axillary masses or adenopathy.  Limited evaluation of the upper abdomen shows some stranding and fluid attenuation adjacent to the gastric cardia and biliary air centrally in  the liver as well as fatty infiltration of the liver.  There is no significant bony abnormality.  No osteoblastic or osteolytic lesions.  IMPRESSION: No evidence of metastatic disease to the chest.  There are mildly prominent left neck base and mediastinal lymph nodes, but none of these are enlarged by size criteria.  There are presumed to be reactive.  Dependent, primarily lower lobe, atelectasis.   Original Report Authenticated By: Amie Portland, M.D.     Scheduled Meds:    . ciprofloxacin  400 mg Intravenous Q12H  . fentaNYL  25 mcg Transdermal Q72H  . insulin aspart  0-9 Units Subcutaneous Q4H  . metronidazole  500 mg Intravenous Q8H  . pantoprazole (PROTONIX) IV  40 mg Intravenous Q24H   Continuous Infusions:    . dextrose 5 % and 0.45 % NaCl with KCl 40 mEq/L 1,000 mL (11/11/12 0420)    Principal Problem:  *Pancreatitis Active Problems:  Diabetes mellitus  HTN (hypertension)  Abdominal pain  Obstructive jaundice  Biliary stricture    Time spent: > 35 mins     [facility]  [facility] Pager [phone]. If 8PM-8AM, please contact night-coverage at [email or web address], password [facility] 11/11/2012, 2:52 PM  LOS: 2 days

## 2012-11-11 NOTE — Progress Notes (Signed)
Utilization review complete 

## 2012-11-12 LAB — GLUCOSE, CAPILLARY
Glucose-Capillary: 100 mg/dL — ABNORMAL HIGH (ref 70–99)
Glucose-Capillary: 145 mg/dL — ABNORMAL HIGH (ref 70–99)
Glucose-Capillary: 153 mg/dL — ABNORMAL HIGH (ref 70–99)

## 2012-11-12 NOTE — Progress Notes (Signed)
  Subjective: Feels better.  Less pain on clears  Objective: Vital signs in last 24 hours: Temp:  [97.8 F (36.6 C)-99.2 F (37.3 C)] 97.8 F (36.6 C) (12/10 0500) Pulse Rate:  [81-96] 84  (12/10 0500) Resp:  [18] 18  (12/10 0500) BP: (110-132)/(74-91) 131/91 mmHg (12/10 0500) SpO2:  [97 %-99 %] 99 % (12/10 0500) Last BM Date:  (PTA)  Intake/Output from previous day:   Intake/Output this shift:    mild epigastric tenderness  Lab Results:   Basename 11/11/12 0650 11/10/12 0635  WBC 14.7* 14.2*  HGB 12.5* 13.3  HCT 37.6* 39.4  PLT 220 200   BMET  Basename 11/11/12 0650 11/10/12 0635  NA 133* 136  K 3.8 3.2*  CL 98 98  CO2 28 25  GLUCOSE 135* 147*  BUN 12 14  CREATININE 0.76 0.70  CALCIUM 8.8 9.0   PT/INR No results found for this basename: LABPROT:2,INR:2 in the last 72 hours ABG No results found for this basename: PHART:2,PCO2:2,PO2:2,HCO3:2 in the last 72 hours  Studies/Results: No results found.  Anti-infectives: Anti-infectives     Start     Dose/Rate Route Frequency Ordered Stop   11/09/12 0845   ciprofloxacin (CIPRO) IVPB 400 mg        400 mg 200 mL/hr over 60 Minutes Intravenous Every 12 hours 11/09/12 0841     11/09/12 0845   metroNIDAZOLE (FLAGYL) IVPB 500 mg        500 mg 100 mL/hr over 60 Minutes Intravenous Every 8 hours 11/09/12 0841            Assessment/Plan: Patient Active Problem List  Diagnosis  . Diabetes mellitus  . HTN (hypertension)  . Pancreatitis  . Abdominal pain  . Obstructive jaundice  . Biliary stricture  periampullary mass Post ERCP pancreatitis  Feels better Family want second opinion elsewhere. They can follow up with Dr Donell Beers as they need to for future surgery.  No acute issues.  Will follow up as needed in house if condition changes.    LOS: 3 days    Draiden Mirsky A. 11/12/2012

## 2012-11-12 NOTE — Progress Notes (Signed)
[facility] PROGRESS NOTE/CONSULT  Hector Pugh ZHY:[id number] DOB: May 13, 1961 DOA: 11/09/2012 PCP: Karle Plumber, MD  Assessment/Plan:  #1 well-controlled type 2 diabetes Has been diet-controlled since 11/04/2012 however prior to that was on metformin. Hemoglobin A1c 6.4. CBGs have ranged from 102-153. Continue sliding scale insulin and follow.CBGs remained in this range on discharge patient will be discharged off of his metformin with close follow up with his PCP.  #2 hypertension Blood pressure is stable. Continue to hold off on oral antihypertensive agents. Hydralazine as needed.if blood pressure remains within this range may discharge off of his oral antihypertensive agents with close followup with his PCP.  #3 acute ERCP induced pancreatitis Per Primary team and general surgery.  Nothing further to add at this time will sign off please call with questions.  Code Status: Full Family Communication: Updated patient and wife at bedside Disposition Plan: Per primary team    Procedures:  CT chest 11/10/2012  Antibiotics:  IV Cipro 11/09/2012  IV Flagyl 11/09/2012  HPI/Subjective: Patient states abdominal pain is slowly improving.  Objective: Filed Vitals:   11/12/12 0500 11/12/12 1048 11/12/12 1613 11/12/12 1834  BP: 131/91 135/77 140/81 132/89  Pulse: 84 85 82 84  Temp: 97.8 F (36.6 C) 98.3 F (36.8 C) 97.5 F (36.4 C) 97.2 F (36.2 C)  TempSrc: Oral Oral Oral Oral  Resp: 18 16 20 18   Height:      Weight:      SpO2: 99% 98% 99% 98%   No intake or output data in the 24 hours ending 11/12/12 1901 Filed Weights   11/09/12 1100  Weight: 68.04 kg (150 lb)    Exam:   General:  NAD  Cardiovascular: RRR. No LE edema  Respiratory: clear to auscultation bilaterally.  Abdomen: SOFT/ less TTP in epigastrium/+BS  Data Reviewed: Basic Metabolic Panel:  Lab 11/11/12 2952 11/10/12 0635 11/09/12 0353  NA 133* 136 137  K 3.8 3.2* 3.7  CL 98 98 98   CO2 28 25 24   GLUCOSE 135* 147* 217*  BUN 12 14 10   CREATININE 0.76 0.70 0.78  CALCIUM 8.8 9.0 9.8  MG -- -- --  PHOS -- -- --   Liver Function Tests:  Lab 11/11/12 0650 11/10/12 0635 11/09/12 0353  AST 29 32 56*  ALT 30 39 60*  ALKPHOS 121* 148* 202*  BILITOT 5.2* 6.7* 11.2*  PROT 6.4 6.8 7.3  ALBUMIN 2.5* 2.8* 3.2*    Lab 11/10/12 0635 11/09/12 0353  LIPASE 603* >3000*  AMYLASE -- --   No results found for this basename: AMMONIA:5 in the last 168 hours CBC:  Lab 11/11/12 0650 11/10/12 0635 11/09/12 0353  WBC 14.7* 14.2* 15.8*  NEUTROABS 11.3* 12.0* 12.5*  HGB 12.5* 13.3 14.1  HCT 37.6* 39.4 41.8  MCV 89.7 88.7 88.6  PLT 220 200 324   Cardiac Enzymes: No results found for this basename: CKTOTAL:5,CKMB:5,CKMBINDEX:5,TROPONINI:5 in the last 168 hours BNP (last 3 results) No results found for this basename: PROBNP:3 in the last 8760 hours CBG:  Lab 11/12/12 1610 11/12/12 1215 11/12/12 0802 11/12/12 0447 11/12/12 0027  GLUCAP 102* 123* 140* 145* 100*    No results found for this or any previous visit (from the past 240 hour(s)).   Studies: No results found.  Scheduled Meds:    . ciprofloxacin  400 mg Intravenous Q12H  . fentaNYL  25 mcg Transdermal Q72H  . insulin aspart  0-9 Units Subcutaneous Q4H  . metronidazole  500 mg Intravenous Q8H  .  pantoprazole (PROTONIX) IV  40 mg Intravenous Q24H   Continuous Infusions:    . dextrose 5 % and 0.45 % NaCl with KCl 40 mEq/L 100 mL/hr at 11/12/12 1659    Principal Problem:  *Pancreatitis Active Problems:  Diabetes mellitus  HTN (hypertension)  Abdominal pain  Obstructive jaundice  Biliary stricture    Time spent: > 35 mins     [facility]  [facility] Pager [phone]. If 8PM-8AM, please contact night-coverage at [email or web address], password [facility] 11/12/2012, 7:01 PM  LOS: 3 days

## 2012-11-12 NOTE — Progress Notes (Signed)
Subjective: Pain about same (5/10) today cf yesterday. Doing ok with sips clears. Is needing hydromorphone IV 4-6 times per day over the past couple days, despite having fentanyl patch.  Objective: Vital signs in last 24 hours: Temp:  [97.8 F (36.6 C)-99.2 F (37.3 C)] 98.3 F (36.8 C) (12/10 1048) Pulse Rate:  [81-96] 85  (12/10 1048) Resp:  [16-18] 16  (12/10 1048) BP: (119-135)/(77-91) 135/77 mmHg (12/10 1048) SpO2:  [98 %-99 %] 98 % (12/10 1048) Weight change:  Last BM Date:  (PTA)  PE: GEN:  Bit deconditioned-appearing ABD:  Epigastric tenderness without peritonitis; hypoactive but present bowel sounds  Lab Results: CBC    Component Value Date/Time   WBC 14.7* 11/11/2012 0650   RBC 4.19* 11/11/2012 0650   HGB 12.5* 11/11/2012 0650   HCT 37.6* 11/11/2012 0650   PLT 220 11/11/2012 0650   MCV 89.7 11/11/2012 0650   MCH 29.8 11/11/2012 0650   MCHC 33.2 11/11/2012 0650   RDW 14.5 11/11/2012 0650   LYMPHSABS 2.1 11/11/2012 0650   MONOABS 1.1* 11/11/2012 0650   EOSABS 0.1 11/11/2012 0650   BASOSABS 0.0 11/11/2012 0650   CMP     Component Value Date/Time   NA 133* 11/11/2012 0650   K 3.8 11/11/2012 0650   CL 98 11/11/2012 0650   CO2 28 11/11/2012 0650   GLUCOSE 135* 11/11/2012 0650   BUN 12 11/11/2012 0650   CREATININE 0.76 11/11/2012 0650   CALCIUM 8.8 11/11/2012 0650   PROT 6.4 11/11/2012 0650   ALBUMIN 2.5* 11/11/2012 0650   AST 29 11/11/2012 0650   ALT 30 11/11/2012 0650   ALKPHOS 121* 11/11/2012 0650   BILITOT 5.2* 11/11/2012 0650   GFRNONAA >90 11/11/2012 0650   GFRAA >90 11/11/2012 0650   Ca 19-9 508 (nl < 35)  EUS final cytology results pending  Assessment:  1.  Post-ERCP pancreatitis, slowly improving. 2.  Obstructive jaundice.  Improving post wallstent placement. 3.  Ampullary mass, preliminary biopsies worrisome for adenocarcinoma, causing obstructive pattern as above.  Plan:  1.  Continue sips clears only. 2.  Continue IV fluids and judicious analgesics. 3.   Check labs (CBC, CMP) in AM. 4.  If patient doesn't exhibit clear improvement tomorrow, might consider CT abd pancreatic protocol tomorrow  to exclude pancreatic necrosis, abscess, phlegmon. 5.  Case discussed with patient's daughter, Dionicio Stall.   Freddy Jaksch 11/12/2012, 11:10 AM

## 2012-11-13 LAB — GLUCOSE, CAPILLARY
Glucose-Capillary: 117 mg/dL — ABNORMAL HIGH (ref 70–99)
Glucose-Capillary: 142 mg/dL — ABNORMAL HIGH (ref 70–99)
Glucose-Capillary: 175 mg/dL — ABNORMAL HIGH (ref 70–99)

## 2012-11-13 LAB — COMPREHENSIVE METABOLIC PANEL
ALT: 33 U/L (ref 0–53)
Albumin: 2.5 g/dL — ABNORMAL LOW (ref 3.5–5.2)
Alkaline Phosphatase: 120 U/L — ABNORMAL HIGH (ref 39–117)
BUN: 8 mg/dL (ref 6–23)
Chloride: 96 mEq/L (ref 96–112)
GFR calc Af Amer: >90 mL/min (ref >90)
Glucose, Bld: 155 mg/dL — ABNORMAL HIGH (ref 70–99)
Potassium: 3.8 mEq/L (ref 3.5–5.1)
Total Bilirubin: 3.9 mg/dL — ABNORMAL HIGH (ref 0.3–1.2)

## 2012-11-13 LAB — CBC WITH DIFFERENTIAL/PLATELET
Basophils Absolute: 0 10*3/uL (ref 0.0–0.1)
Basophils Relative: 0 % (ref 0–1)
Eosinophils Relative: 2 % (ref 0–5)
Lymphocytes Relative: 19 % (ref 12–46)
MCHC: 34 g/dL (ref 30.0–36.0)
MCV: 89.1 fL (ref 78.0–100.0)
Platelets: 202 10*3/uL (ref 150–400)
RDW: 13.8 % (ref 11.5–15.5)
WBC: 8.1 10*3/uL (ref 4.0–10.5)

## 2012-11-13 MED ORDER — WHITE PETROLATUM GEL
Status: AC
  Administered 2012-11-13: 19:00:00
  Filled 2012-11-13: qty 5

## 2012-11-13 NOTE — Progress Notes (Signed)
[facility] PROGRESS NOTE Subjective Patient reports he feels a bit better than yesterday. He is going to [facility] next week for another opinion and they requested x-ray reports on DVD. He appears to be tolerating clear liquids. No lipase was obtained today. He has had some flatus but no bowel movements.  Objective: Vital signs in last 24 hours: Temp:  [97.2 F (36.2 C)-98.5 F (36.9 C)] 97.6 F (36.4 C) (12/11 1021) Pulse Rate:  [70-84] 70  (12/11 1021) Resp:  [18-20] 18  (12/11 1021) BP: (123-141)/(81-89) 141/86 mmHg (12/11 1021) SpO2:  [98 %-100 %] 100 % (12/11 1021) Last BM Date: 11/09/12  Intake/Output from previous day:   Intake/Output this shift: Total I/O In: 240 [P.O.:240] Out: -   PE: Abdomen-soft with mild epigastric tenderness. He does have a few bowel sounds.  Lab Results:  [facility] 11/13/12 0550 11/11/12 0650  WBC 8.1 14.7*  HGB 11.4* 12.5*  HCT 33.5* 37.6*  PLT 202 220   BMET  Basename 11/13/12 0550 11/11/12 0650  NA 131* 133*  K 3.8 3.8  CL 96 98  CO2 25 28  CREATININE 0.60 0.76   LFT  Basename 11/13/12 0550 11/11/12 0650  PROT 6.1 6.4  AST 46* 29  ALT 33 30  ALKPHOS 120* 121*  BILITOT 3.9* 5.2*  BILIDIR -- --  IBILI -- --   PT/INR No results found for this basename: LABPROT:3,INR:3 in the last 72 hours PANCREAS No results found for this basename: LIPASE:3 in the last 72 hours       Studies/Results: No results found.  Medications: I have reviewed the patient's current medications.  Assessment/Plan: 1. Post-ERCP pancreatitis. Appears to be slowly improving. Tolerating clear liquids. Long discussion with the family about need to avoid fatty foods. CA 19-9 and passed apparently worrisome for adenocarcinoma. He does have a biliary stent in place.  Plan: We'll check lipase tomorrow. Would keep on clear liquids for now. I have called radiology and they will make a DVD of his xray studies Wife to signit out.11/13/2012,  2:29 PM  Carman Ching, MD

## 2012-11-13 NOTE — Progress Notes (Signed)
Utilization review complete 

## 2012-11-14 ENCOUNTER — Telehealth (INDEPENDENT_AMBULATORY_CARE_PROVIDER_SITE_OTHER): Payer: Self-pay

## 2012-11-14 LAB — GLUCOSE, CAPILLARY
Glucose-Capillary: 108 mg/dL — ABNORMAL HIGH (ref 70–99)
Glucose-Capillary: 136 mg/dL — ABNORMAL HIGH (ref 70–99)
Glucose-Capillary: 137 mg/dL — ABNORMAL HIGH (ref 70–99)

## 2012-11-14 MED ORDER — PANTOPRAZOLE SODIUM 40 MG PO TBEC
40.0000 mg | DELAYED_RELEASE_TABLET | Freq: Every day | ORAL | Status: DC
  Administered 2012-11-15: 40 mg via ORAL
  Filled 2012-11-14: qty 1

## 2012-11-14 MED ORDER — FENTANYL 25 MCG/HR TD PT72
50.0000 ug | MEDICATED_PATCH | TRANSDERMAL | Status: DC
  Administered 2012-11-15: 50 ug via TRANSDERMAL
  Filled 2012-11-14: qty 2

## 2012-11-14 MED ORDER — OXYCODONE-ACETAMINOPHEN 5-325 MG PO TABS
1.0000 | ORAL_TABLET | Freq: Four times a day (QID) | ORAL | Status: DC | PRN
  Administered 2012-11-14 – 2012-11-15 (×4): 1 via ORAL
  Filled 2012-11-14 (×4): qty 1

## 2012-11-14 NOTE — Telephone Encounter (Signed)
Pt is scheduled for a pre-op consult with Dr. Donell Beers on 12/06/12.

## 2012-11-14 NOTE — Progress Notes (Signed)
Subjective: Pain improving. Tolerating clears. Had bowel movement.  Objective: Vital signs in last 24 hours: Temp:  [97.8 F (36.6 C)-98.3 F (36.8 C)] 98.3 F (36.8 C) (12/12 0215) Pulse Rate:  [70-78] 77  (12/12 0215) Resp:  [17] 17  (12/12 0215) BP: (129-146)/(83-89) 135/83 mmHg (12/12 0215) SpO2:  [98 %-100 %] 100 % (12/12 0215) Weight change:  Last BM Date: 11/14/12 (small amout)  PE: GEN:  NAD ABD:  Active bowel sounds, less tender  Lab Results: CBC    Component Value Date/Time   WBC 8.1 11/13/2012 0550   RBC 3.76* 11/13/2012 0550   HGB 11.4* 11/13/2012 0550   HCT 33.5* 11/13/2012 0550   PLT 202 11/13/2012 0550   MCV 89.1 11/13/2012 0550   MCH 30.3 11/13/2012 0550   MCHC 34.0 11/13/2012 0550   RDW 13.8 11/13/2012 0550   LYMPHSABS 1.6 11/13/2012 0550   MONOABS 0.6 11/13/2012 0550   EOSABS 0.2 11/13/2012 0550   BASOSABS 0.0 11/13/2012 0550   CMP     Component Value Date/Time   NA 131* 11/13/2012 0550   K 3.8 11/13/2012 0550   CL 96 11/13/2012 0550   CO2 25 11/13/2012 0550   GLUCOSE 155* 11/13/2012 0550   BUN 8 11/13/2012 0550   CREATININE 0.60 11/13/2012 0550   CALCIUM 8.6 11/13/2012 0550   PROT 6.1 11/13/2012 0550   ALBUMIN 2.5* 11/13/2012 0550   AST 46* 11/13/2012 0550   ALT 33 11/13/2012 0550   ALKPHOS 120* 11/13/2012 0550   BILITOT 3.9* 11/13/2012 0550   GFRNONAA >90 11/13/2012 0550   GFRAA >90 11/13/2012 0550   Assessment:  1.  Ampullary adenocarcinoma with biliary obstruction, resolved post-stenting. 2.  Post-ERCP pancreatitis, improving.  Plan:  1.  Increase Fentanyl patch, with Percocet for breakthrough, IV hydromorphone only for severe pain. 2.  Advance diet. 3.  Hopefully home in 1-2 days.  Outpatient appointment with [facility] scheduled next week, and Dr. Donell Beers is making operative arrangements as well. 4.  Case discussed with daughter Dionicio Stall.   Freddy Jaksch 11/14/2012, 10:38 AM

## 2012-11-15 LAB — GLUCOSE, CAPILLARY
Glucose-Capillary: 170 mg/dL — ABNORMAL HIGH (ref 70–99)
Glucose-Capillary: 156 mg/dL — ABNORMAL HIGH (ref 70–99)
Glucose-Capillary: 140 mg/dL — ABNORMAL HIGH (ref 70–99)
Glucose-Capillary: 161 mg/dL — ABNORMAL HIGH (ref 70–99)

## 2012-11-15 NOTE — Progress Notes (Signed)
Subjective: Pain continues to improve.  Did not need any intravenous hydromorphone in past ~ 24 hours. Tolerating soft mechanical diet.  Objective: Vital signs in last 24 hours: Temp:  [97.6 F (36.4 C)-98.2 F (36.8 C)] 97.7 F (36.5 C) (12/13 0600) Pulse Rate:  [62-78] 66  (12/13 0600) Resp:  [18] 18  (12/13 0600) BP: (128-150)/(72-87) 130/73 mmHg (12/13 0600) SpO2:  [100 %] 100 % (12/13 0600) Weight change:  Last BM Date: 11/14/12 (small amout)  PE: GEN:  NAD ABD:  Mild epigastric tenderness (improving)  Assessment:  1.  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 2.  Post-ERCP pancreatitis, much improved.  Plan:  1.  Continue fentanyl 50 mcg patch, now and for ~ 2 weeks upon discharge. 2.  Continue Percocet for breakthrough pain, now and upon discharge. 3.  Continue soft mechanical diet. 4.  Saline lock IV. 5.  Advised patient to continue to hold metformin, now and upon discharge, unless/until blood sugars rise > 200. 6.  Advised patient to continue to hold his Toprol XL, now and upon discharge, unless/until systolic blood pressure > 140. 7.  Patient would like to make sure he tolerates his diet for a full 24 hours before discharge.   8.  I have discussed case with patient, daughter Hector Pugh and his son.  Plan for discharge home tomorrow.  He has appointment with [facility] next Wednesday and Dr. Donell Beers also has him tentatively on [facility] schedule for early January.   Hector Pugh 11/15/2012, 10:02 AM

## 2012-11-16 LAB — GLUCOSE, CAPILLARY: Glucose-Capillary: 181 mg/dL — ABNORMAL HIGH (ref 70–99)

## 2012-11-16 MED ORDER — OXYCODONE-ACETAMINOPHEN 5-325 MG PO TABS: 1.0000 | ORAL_TABLET | Freq: Four times a day (QID) | ORAL | Status: DC | PRN

## 2012-11-16 MED ORDER — UNABLE TO FIND: Status: DC

## 2012-11-16 MED ORDER — FENTANYL 25 MCG/HR TD PT72: 1.0000 | MEDICATED_PATCH | TRANSDERMAL | Status: DC

## 2012-11-16 NOTE — Discharge Summary (Signed)
  [facility]astroenterology Discharge Summary  Damarie Schoolfield MRN: [id number]  Admit date: 11/09/2012 Discharge date: 11/16/2012  Admission Diagnoses:  Discharge Diagnoses:  Principal Problem:  *Pancreatitis Active Problems:  Diabetes mellitus  HTN (hypertension)  Abdominal pain  Obstructive jaundice  Biliary stricture   Consults: Dr. Donell Beers History of Present Illness: The patient is a 51 year old [place] male who is admitted with post ERCP pancreatitis after ERCP with endoscopic stent placement and brushings for biliary obstruction. Brushings came back positive for adenocarcinoma.  Hospital Course: The patient was treated with general supportive care with analgesics and bowel rest. He improved on these measures relatively uneventfully. The cytology did come back positive for adenocarcinoma and he was seen by Dr. Donell Beers who arranged further workup and staging for possible surgical therapy. Arrangements were also made for him to be seen by [facility] for second opinion regarding management. On the day of discharge the patient was alert oriented in eating a regular diet and requiring minimal pain medicine. He has an appointment at [facility] in 3 days and with Dr. Donell Beers in January.  Discharged Condition: {Improved  Disposition: 01-Home or Self Care  Discharge Orders    Future Appointments: Provider: Department: Dept Phone: Center:   12/06/2012 2:15 PM Almond Lint, MD [facility] Surgery, [place] [phone] None       Medication List     As of 11/16/2012 12:27 PM    TAKE these medications         fentaNYL 25 MCG/HR   Commonly known as: DURAGESIC - dosed mcg/hr   Place 1 patch (25 mcg total) onto the skin every 3 (three) days.      loratadine 10 MG tablet   Commonly known as: CLARITIN   Take 10 mg by mouth daily.      metoprolol succinate 100 MG 24 hr tablet   Commonly known as: TOPROL-XL   Take 100 mg by mouth every morning. Take with or immediately following a  meal.      oxyCODONE-acetaminophen 5-325 MG per tablet   Commonly known as: PERCOCET/ROXICET   Take 1 tablet by mouth every 6 (six) hours as needed.           Follow-up Information    Follow up with [facility], [place]. Schedule an appointment as soon as possible for a visit in 2 weeks.   Contact information:   [address] [place] [address] [phone]          Signed: Barrie Folk 11/16/2012, 12:27 PM

## 2012-11-16 NOTE — Progress Notes (Signed)
Pt AVS and discharge instruction given . Pt to follow up at [facility] on Wednesday next week and with Dr Donell Beers in Jan 2014. Both pt and daughter verbalized understanding. Pt  discharged home accompanied by daughter Puja Pt denied pain during discharge. Condition stable.

## 2012-11-29 ENCOUNTER — Encounter (HOSPITAL_COMMUNITY): Payer: Self-pay | Admitting: Pharmacy Technician

## 2012-12-06 ENCOUNTER — Other Ambulatory Visit (HOSPITAL_COMMUNITY): Payer: Self-pay | Admitting: *Deleted

## 2012-12-06 ENCOUNTER — Encounter (INDEPENDENT_AMBULATORY_CARE_PROVIDER_SITE_OTHER): Payer: Self-pay | Admitting: General Surgery

## 2012-12-06 ENCOUNTER — Ambulatory Visit (INDEPENDENT_AMBULATORY_CARE_PROVIDER_SITE_OTHER): Payer: BC Managed Care – PPO | Admitting: General Surgery

## 2012-12-06 VITALS — BP 136/90 | HR 56 | Temp 98.9°F | Resp 16 | Ht 62.0 in | Wt 149.0 lb

## 2012-12-06 DIAGNOSIS — C241 Malignant neoplasm of ampulla of Vater: Secondary | ICD-10-CM

## 2012-12-06 NOTE — Patient Instructions (Addendum)
WHIPPLE (PANCREATICODUODENECTOMY) THE OPERATION: The goal of the operation is to remove masses in the head of the pancreas, the distal bile duct, or the duodenum.  The structures that are removed include the head of the pancreas, the duodenum, the gallbladder, and a small portion of your stomach.  The jejunum, or middle part of the small intestine, is then used to reconnect the bile duct, the pancreas, and the stomach to the intestinal tract.  The reason so much needs to be removed is that the blood supply and lymphatic channels and nodes are closely interconnected.  The operation takes between 4 and 8 hours depending on the amount of scar tissue you have present from prior surgeries, or from the mass.    WHAT HAPPENS IN THE OPERATING ROOM: I will start the operation with a diagnostic laparoscopy.  This means we will make small incisions to see if there is visible cancer outside of the pancreas.  If there is visible spread of cancer, I will stop the operation because research has shown that survival for pancreatic cancer is worse if you undergo a big operation without being able to remove all the cancer.  If no other cancer is seen, I will make a bigger incision on your abdomen and begin mobilizing the stomach, duodenum and gallbladder to see if the tumor is able to be removed.  Sometimes the tumor is too adherent to the arteries and veins supplying the liver and small intestine that it is unsafe or not possible to remove the tumor.  If this is the case, the procedure would be stopped and the tumor would be left in place.  Whatever the operative findings, I will discuss the case with your family after we are done in the operating room.  I will talk to you in the next few days when you are more awake.  I will see you in the hospital every weekday that I am not out of town.  My partners help see patients on the weekends and if I am out of town.    WHAT HAPPENS AFTER SURGERY: After surgery, you will go  first to the recovery room, then to the ICU for careful monitoring.  It is important that I am able to know your vital signs and urine output to see how you are doing.  Depending on many factors, you may be in the ICU overnight, or for several days.  You will have many tubes and lines in place which is standard for this operation.  You will have several IVs, including possibly a central line in your chest or neck.  You will likely have an arterial line in your wrist.  You will have a tube in your nose for 4-5 days in order to suction out the stomach and liver secretions to help the surgical connections heal.  YOU WILL NOT BE ABLE TO EAT FOR AT LEAST 4-5 DAYS AFTER SURGERY.  You will have a catheter in your bladder.  On your abdomen, you will have several surgical drains and possibly a pain pump with numbing medicine.  You will have compression stockings on your legs to decrease the risk of blood clots.    Sometimes anesthesia makes a decision that you may need to be left on the breathing machine for a period after surgery.  This may be based on your overall health status, or events during surgery.    We will address your pain in several ways.  We will use an IV pain pump   called a "PCA," or Patient Controlled Analgesia.  This allows you to press a button and immediately receive a dose of pain medication without waiting for a nurse.  We also use IV Tylenol and sometimes IV Toradol which is similar to ibuprofen.  You may also have a pump with numbing medicine delivered directly to your incision.  I use doses and medications that work for the majority of people, but you may need an adjustment to the dose or type of medicine if your pain is not adequately controlled.  Your throat may be sore, in which case you may need a throat spray or lozenges.    We will ask you to get out of bed the day after surgery in order to maximize your chances of not having complications.  Your risk of pneumonia and blood clots is lower  with walking and sitting in the chair.  We will also ask you to perform breathing exercises.  We will also ask to you walk in your room and in the halls for the above reasons, but also in order for you to keep up your strength.    EATING: We will usually start you on clear liquids in around 5 days if your bowel function seems to have returned.  We advance your diet slowly to make sure you are tolerating each step.  All patients do not have a normal appetite when they go home and usually have to take 2-4 cans of nutritional supplement per day while this is improving.  Most patients also find that their taste buds do not seem the same right after surgery, and this can continue into the time of possible post operative chemotherapy and radiation.  Some patients develop diabetes and will need assistance from a primary care doctor for medication.    OTHER TREATMENT? I will present your case when the pathology is available at our GI Multidisciplinary conference which occurs every 1-2 weeks.  Medical and Radiation Oncologists are present, as well as the pathologist and radiologist in addition to myself.  If you have a larger tumor or if you have positive lymph nodes, we may have the oncologist stop by to see you while you are in the hospital.  Most firm post op treatment plans occur, however, once you are an outpatient because we will need to see how you are recovering from surgery.  GOING HOME! Usually you are able to go home in 8-14 days, depending on whether or not complications happen and what is going on with your overall health status.   If you have more health problems or if you have limited help at home, the therapists and nurses may recommend a temporary rehab or nursing facility to help you get back on your feet before you go home.  These decisions would be made while you are in the hospital with the assistance of a social worker or case manager.    Please bring all insurance/disability forms to our  office for the staff to fill out   POSSIBLE COMPLICATIONS This is a very extensive operation and includes complications listed below: Bleeding Infection and possible wound complications such as hernia Damage to adjacent structures Leak of surgical connections, the worst of which is the connection between the intestine and the pancreas (20%) Possible need for other procedures, such as abscess drains in radiology or endoscopy.   Possible prolonged hospital stay Possible development of diabetes or worsening of current diabetes. (5-20%) Possible diarrhea from lack of pancreatic enzymes.   Prolonged   fatigue/weakness/appetite MOST PATIENTS' ENERGY LEVEL IS NOT BACK TO NORMAL FOR AT LEAST 4-6 MONTHS.  OLDER PATIENTS MAY FEEL WEAK FOR LONGER PERIODS OF TIME.   Difficulty with eating or post operative nausea (around 30%) Possible early recurrence of cancer Possible complications of your medical problems such as heart disease or arrhythmias. Death (less than 2%)  All possible complications are not listed, just the most common.    FURTHER INFORMATION? Please ask questions if you find something that we did not discuss in the office and would like more information.  If you would like another appointment if you have many questions or if your family members would like to come as well, please contact the office.     IF YOU ARE TAKING ASPIRIN, PLAVIX, COUMADIN, OR OTHER BLOOD THINNERS, LET US KNOW IMMEDIATELY SO WE CAN CONTACT YOUR PRESCRIBING HEALTH CARE PROVIDER TO HOLD THE MEDICATION FOR 5-7 DAYS BEFORE SURGERY  

## 2012-12-06 NOTE — Progress Notes (Signed)
CT Chest, EKG 12/13 [facility]

## 2012-12-06 NOTE — Patient Instructions (Addendum)
20 Jud Fanguy  12/06/2012   Your procedure is scheduled on:  12/12/12  THURSDAY  Report to [facility] at  0515     AM.  Call this number if you have problems the morning of surgery: [phone]       Remember:   Do not eat food after midnight Tuesday NIGHT-    START CLEAR LIQUIDS ONLY ON Wednesday-  INCREASE AMOUNT OF FLUIDS AND DRINK UNTIL MIDNIGHT OR BEDTIME  Wednesday NIGHT-  THEN NOTHING BY MOUTH AFTER MIDNIGHT Wednesday NIGHT                                       BEGIN BOWEL PREP Wednesday AS PER OFFICE   Take these medicines the morning of surgery with A SIP OF WATER:METOPROLOL   .  Contacts, dentures or partial plates can not be worn to surgery  Leave suitcase in the car. After surgery it may be brought to your room.  For patients admitted to the hospital, checkout time is 11:00 AM day of  discharge.             SPECIAL INSTRUCTIONS- SEE [place] PREPARING FOR SURGERY INSTRUCTION SHEET-     DO NOT WEAR JEWELRY, LOTIONS, POWDERS, OR PERFUMES.  WOMEN-- DO NOT SHAVE LEGS OR UNDERARMS FOR 12 HOURS BEFORE SHOWERS. MEN MAY SHAVE FACE.  Patients discharged the day of surgery will not be allowed to drive home. IF going home the day of surgery, you must have a driver and someone to stay with you for the first 24 hours  Name and phone number of your driver: admission                                                                       Please read over the following fact sheets that you were given: MRSA Information, Incentive Spirometry Sheet, Blood Transfusion Sheet  Information                                                                                   Hector Pugh  PST 336  [id number]                 FAILURE TO FOLLOW THESE INSTRUCTIONS MAY RESULT IN  CANCELLATION   OF YOUR SURGERY                                                  Patient Signature _____________________________

## 2012-12-06 NOTE — Progress Notes (Signed)
Patient ID: Hector Pugh, male   DOB: 06/17/1961, 52 y.o.   MRN: [id number]  Chief Complaint  Patient presents with  . Pre-op Exam    whipple    HPI [name] is a 52 y.o. male.   HPI Patient is a 52-year-old male who presented with painless jaundice. He then got post ERCP pancreatitis. He was discharged to home prior to Christmas. 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 Biopsies were positive for dysplasia or carcinoma in situ but not for frank carcinoma. He has been doing better since being at home. He is a lot better. His itching has resolved. He has not continued to lose weight.  Past Medical History  Diagnosis Date  . Diabetes mellitus without complication   . Hypertension   . Diabetes mellitus 11/10/2012  . HTN (hypertension) 11/10/2012  . Pancreatitis 11/10/2012  . 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 Biliary stricture 11/11/2012    S/p biliary stent 11/08/12    Past Surgical History  Procedure Date  . Circucision   . Hernia repair     RIGHT  . Left foot surgery   . Vasectomy   . Circumsision   . Left foot surgery   . Eus 11/08/2012    Procedure: UPPER ENDOSCOPIC ULTRASOUND (EUS) LINEAR;  Surgeon: William Outlaw, MD;  Location: [facility];  Service: Endoscopy;  Laterality: N/A;  . Fine needle aspiration 11/08/2012    Procedure: FINE NEEDLE ASPIRATION (FNA) LINEAR;  Surgeon: William Outlaw, MD;  Location: [facility];  Service: Endoscopy;  Laterality: N/A;  . Ercp 11/08/2012    Procedure: ENDOSCOPIC RETROGRADE CHOLANGIOPANCREATOGRAPHY (ERCP);  Surgeon: Marc E Magod, MD;  Location: [facility];  Service: Gastroenterology;  Laterality: N/A;    Family History  Problem Relation Age of Onset  . Cancer Mother     esophageal carcinoma    Social History History  Substance Use Topics  . Smoking status: Never Smoker   . Smokeless tobacco: Never Used  . Alcohol Use: No    No Known  Allergies  Current Outpatient Prescriptions  Medication Sig Dispense Refill  . loratadine (CLARITIN) 10 MG tablet Take 10 mg by mouth daily as needed. For allergies      . metoprolol succinate (TOPROL-XL) 100 MG 24 hr tablet Take 100 mg by mouth every morning. Take with or immediately following a meal.        Review of Systems Review of Systems  Constitutional: Positive for unexpected weight change.  Gastrointestinal: Positive for abdominal pain (improved from pre op).    Blood pressure 136/90, pulse 56, temperature 98.9 F (37.2 C), temperature source Temporal, resp. rate 16, height 5' 2" (1.575 m), weight 149 lb (67.586 kg).  Physical Exam Physical Exam  Constitutional: He is oriented to person, place, and time. He appears well-developed and well-nourished. No distress.  HENT:  Head: Normocephalic and atraumatic.  Cardiovascular: Normal rate and regular rhythm.   Pulmonary/Chest: Effort normal. No respiratory distress.  Abdominal: Soft. He exhibits no distension. There is no tenderness.  Musculoskeletal: Normal range of motion.  Neurological: He is alert and oriented to person, place, and time.  Skin: Skin is warm and dry. He is not diaphoretic.  Psychiatric: He has a normal mood and affect. His behavior is normal. Judgment and thought content normal.   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 The patient had a second opinion at   [facility] and prefers to stay in [place] for surgical treatment.  I discussed the surgery with the patient including diagrams of anatomy.  I discussed the potential for diagnostic laparoscopy.  In the case of pancreatic cancer, if spread of the disease is found, we will abort the procedure and not proceed with resection.  The rationale for this was discussed with the patient.  There has not been data to support resection of Stage IV disease in terms of survival benefit.    We discussed  possible complications including: Potential of aborting procedure if tumor is invading the superior mesenteric or hepatic arteries Bleeding Infection and possible wound complications such as hernia Damage to adjacent structures Leak of anastamoses, primarily pancreatic Possible need for other procedures Possible prolonged hospital stay Possible development of diabetes or worsening of current diabetes.  Possible pancreatic exocrine insufficiency Prolonged fatigue/weakness/appetite Difficulty with eating or post operative nausea Possible early recurrence of cancer   The patient understands and wishes to proceed.  The patient has been advised to turn in disability paperwork to our office.             Demeshia Sherburne 12/06/2012, 5:08 PM    

## 2012-12-06 NOTE — Assessment & Plan Note (Signed)
Plan Whipple procedure next Thursday. The patient had a second opinion at [facility] and prefers to stay in [place] for surgical treatment.  I discussed the surgery with the patient including diagrams of anatomy.  I discussed the potential for diagnostic laparoscopy.  In the case of pancreatic cancer, if spread of the disease is found, we will abort the procedure and not proceed with resection.  The rationale for this was discussed with the patient.  There has not been data to support resection of Stage IV disease in terms of survival benefit.    We discussed possible complications including: Potential of aborting procedure if tumor is invading the superior mesenteric or hepatic arteries Bleeding Infection and possible wound complications such as hernia Damage to adjacent structures Leak of anastamoses, primarily pancreatic Possible need for other procedures Possible prolonged hospital stay Possible development of diabetes or worsening of current diabetes.  Possible pancreatic exocrine insufficiency Prolonged fatigue/weakness/appetite Difficulty with eating or post operative nausea Possible early recurrence of cancer   The patient understands and wishes to proceed.  The patient has been advised to turn in disability paperwork to our office.

## 2012-12-09 ENCOUNTER — Encounter (HOSPITAL_COMMUNITY): Payer: Self-pay

## 2012-12-09 ENCOUNTER — Encounter (HOSPITAL_COMMUNITY)
Admission: RE | Admit: 2012-12-09 | Discharge: 2012-12-09 | Disposition: A | Discharge status: Home / Self Care | Payer: BC Managed Care – PPO | Source: Ambulatory Visit | Attending: General Surgery | Admitting: General Surgery

## 2012-12-09 HISTORY — DX: Other seasonal allergic rhinitis: J30.2

## 2012-12-09 LAB — URINALYSIS, ROUTINE W REFLEX MICROSCOPIC
Bilirubin Urine: NEGATIVE
Glucose, UA: 500 mg/dL — AB
Hgb urine dipstick: NEGATIVE
Ketones, ur: NEGATIVE mg/dL
Leukocytes, UA: NEGATIVE
pH: 5 (ref 5.0–8.0)

## 2012-12-09 LAB — COMPREHENSIVE METABOLIC PANEL
AST: 21 U/L (ref 0–37)
Albumin: 3.6 g/dL (ref 3.5–5.2)
BUN: 9 mg/dL (ref 6–23)
Chloride: 101 mEq/L (ref 96–112)
Creatinine, Ser: 0.75 mg/dL (ref 0.50–1.35)
Potassium: 4.2 mEq/L (ref 3.5–5.1)
Total Protein: 6.7 g/dL (ref 6.0–8.3)

## 2012-12-09 LAB — APTT: aPTT: 35 seconds (ref 24–37)

## 2012-12-09 LAB — CBC WITH DIFFERENTIAL/PLATELET
Basophils Absolute: 0 10*3/uL (ref 0.0–0.1)
Basophils Relative: 0 % (ref 0–1)
Eosinophils Absolute: 0.3 10*3/uL (ref 0.0–0.7)
Hemoglobin: 13.3 g/dL (ref 13.0–17.0)
MCH: 29.4 pg (ref 26.0–34.0)
MCHC: 34.6 g/dL (ref 30.0–36.0)
Monocytes Relative: 7 % (ref 3–12)
Neutro Abs: 2.6 10*3/uL (ref 1.7–7.7)
Neutrophils Relative %: 49 % (ref 43–77)
RDW: 13.9 % (ref 11.5–15.5)

## 2012-12-09 LAB — ABO/RH: ABO/RH(D): O POS

## 2012-12-09 LAB — PROTIME-INR: Prothrombin Time: 13.5 seconds (ref 11.6–15.2)

## 2012-12-09 LAB — LIPASE, BLOOD: Lipase: 109 U/L — ABNORMAL HIGH (ref 11–59)

## 2012-12-09 NOTE — Progress Notes (Signed)
Faxed note through [facility] to Dr Donell Beers to review  Lipase and urinalysis

## 2012-12-09 NOTE — Progress Notes (Signed)
Instructed patient and daughter at 0820 am to call office for bowel prep instructions-  Clear liquid list given

## 2012-12-12 ENCOUNTER — Ambulatory Visit (HOSPITAL_COMMUNITY): Payer: BC Managed Care – PPO | Admitting: Anesthesiology

## 2012-12-12 ENCOUNTER — Encounter (HOSPITAL_COMMUNITY): Payer: Self-pay | Admitting: Anesthesiology

## 2012-12-12 ENCOUNTER — Encounter (HOSPITAL_COMMUNITY): Payer: Self-pay

## 2012-12-12 ENCOUNTER — Inpatient Hospital Stay (HOSPITAL_COMMUNITY)
Admission: RE | Admit: 2012-12-12 | Discharge: 2012-12-23 | DRG: 191 | Disposition: A | Discharge status: Home / Self Care | Payer: BC Managed Care – PPO | Source: Ambulatory Visit | Attending: General Surgery | Admitting: General Surgery

## 2012-12-12 ENCOUNTER — Ambulatory Visit (HOSPITAL_COMMUNITY): Payer: BC Managed Care – PPO

## 2012-12-12 ENCOUNTER — Encounter (HOSPITAL_COMMUNITY)
Admission: RE | Disposition: A | Discharge status: Home / Self Care | Payer: Self-pay | Source: Ambulatory Visit | Attending: General Surgery

## 2012-12-12 DIAGNOSIS — I4891 Unspecified atrial fibrillation: Secondary | ICD-10-CM | POA: Diagnosis not present

## 2012-12-12 DIAGNOSIS — C241 Malignant neoplasm of ampulla of Vater: Secondary | ICD-10-CM

## 2012-12-12 DIAGNOSIS — E871 Hypo-osmolality and hyponatremia: Secondary | ICD-10-CM | POA: Diagnosis not present

## 2012-12-12 DIAGNOSIS — R109 Unspecified abdominal pain: Secondary | ICD-10-CM

## 2012-12-12 DIAGNOSIS — Z79899 Other long term (current) drug therapy: Secondary | ICD-10-CM

## 2012-12-12 DIAGNOSIS — K861 Other chronic pancreatitis: Secondary | ICD-10-CM | POA: Diagnosis present

## 2012-12-12 DIAGNOSIS — K831 Obstruction of bile duct: Secondary | ICD-10-CM

## 2012-12-12 DIAGNOSIS — E861 Hypovolemia: Secondary | ICD-10-CM | POA: Diagnosis not present

## 2012-12-12 DIAGNOSIS — K859 Acute pancreatitis without necrosis or infection, unspecified: Secondary | ICD-10-CM

## 2012-12-12 DIAGNOSIS — I10 Essential (primary) hypertension: Secondary | ICD-10-CM | POA: Diagnosis present

## 2012-12-12 DIAGNOSIS — I959 Hypotension, unspecified: Secondary | ICD-10-CM | POA: Diagnosis not present

## 2012-12-12 DIAGNOSIS — E119 Type 2 diabetes mellitus without complications: Secondary | ICD-10-CM | POA: Diagnosis present

## 2012-12-12 HISTORY — PX: WHIPPLE PROCEDURE: SHX2667

## 2012-12-12 HISTORY — PX: LAPAROSCOPY: SHX197

## 2012-12-12 LAB — POCT I-STAT 4, (NA,K, GLUC, HGB,HCT)
Glucose, Bld: 168 mg/dL — ABNORMAL HIGH (ref 70–99)
HCT: 34 % — ABNORMAL LOW (ref 39.0–52.0)
Hemoglobin: 11.6 g/dL — ABNORMAL LOW (ref 13.0–17.0)
Sodium: 134 mEq/L — ABNORMAL LOW (ref 135–145)

## 2012-12-12 LAB — GLUCOSE, CAPILLARY
Glucose-Capillary: 133 mg/dL — ABNORMAL HIGH (ref 70–99)
Glucose-Capillary: 199 mg/dL — ABNORMAL HIGH (ref 70–99)
Glucose-Capillary: 162 mg/dL — ABNORMAL HIGH (ref 70–99)

## 2012-12-12 LAB — CREATININE, SERUM
Creatinine, Ser: 0.57 mg/dL (ref 0.50–1.35)
GFR calc Af Amer: >90 mL/min (ref >90)
GFR calc non Af Amer: >90 mL/min (ref >90)

## 2012-12-12 LAB — CBC
Platelets: 221 10*3/uL (ref 150–400)
RBC: 4.49 MIL/uL (ref 4.22–5.81)
WBC: 17.3 10*3/uL — ABNORMAL HIGH (ref 4.0–10.5)

## 2012-12-12 SURGERY — LAPAROSCOPY, DIAGNOSTIC
Anesthesia: General | Site: Abdomen | Wound class: Clean Contaminated

## 2012-12-12 MED ORDER — METRONIDAZOLE IN NACL 5-0.79 MG/ML-% IV SOLN
500.0000 mg | Freq: Three times a day (TID) | INTRAVENOUS | Status: AC
  Administered 2012-12-12 – 2012-12-13 (×2): 500 mg via INTRAVENOUS
  Filled 2012-12-12 (×2): qty 100

## 2012-12-12 MED ORDER — METRONIDAZOLE IN NACL 5-0.79 MG/ML-% IV SOLN
500.0000 mg | INTRAVENOUS | Status: AC
  Administered 2012-12-12: 500 mg via INTRAVENOUS

## 2012-12-12 MED ORDER — FENTANYL CITRATE 0.05 MG/ML IJ SOLN
INTRAMUSCULAR | Status: DC | PRN
  Administered 2012-12-12 (×2): 50 ug via INTRAVENOUS

## 2012-12-12 MED ORDER — CIPROFLOXACIN IN D5W 400 MG/200ML IV SOLN
400.0000 mg | Freq: Two times a day (BID) | INTRAVENOUS | Status: AC
  Administered 2012-12-12: 400 mg via INTRAVENOUS
  Filled 2012-12-12: qty 200

## 2012-12-12 MED ORDER — DIPHENHYDRAMINE HCL 12.5 MG/5ML PO ELIX: 12.5000 mg | ORAL_SOLUTION | Freq: Four times a day (QID) | ORAL | Status: DC | PRN

## 2012-12-12 MED ORDER — HYDROMORPHONE HCL PF 1 MG/ML IJ SOLN
INTRAMUSCULAR | Status: DC | PRN
  Administered 2012-12-12: 0.5 mg via INTRAVENOUS
  Administered 2012-12-12: .5 mg via INTRAVENOUS
  Administered 2012-12-12 (×2): 0.5 mg via INTRAVENOUS
  Administered 2012-12-12: .5 mg via INTRAVENOUS
  Administered 2012-12-12 (×2): 0.5 mg via INTRAVENOUS
  Administered 2012-12-12: .5 mg via INTRAVENOUS

## 2012-12-12 MED ORDER — TISSEEL VH 10 ML EX KIT
PACK | CUTANEOUS | Status: AC
  Filled 2012-12-12: qty 1

## 2012-12-12 MED ORDER — KCL IN DEXTROSE-NACL 20-5-0.45 MEQ/L-%-% IV SOLN
INTRAVENOUS | Status: AC
  Filled 2012-12-12: qty 1000

## 2012-12-12 MED ORDER — LACTATED RINGERS IV SOLN: INTRAVENOUS | Status: DC

## 2012-12-12 MED ORDER — LIDOCAINE HCL (CARDIAC) 20 MG/ML IV SOLN
INTRAVENOUS | Status: DC | PRN
  Administered 2012-12-12: 100 mg via INTRAVENOUS

## 2012-12-12 MED ORDER — INSULIN ASPART 100 UNIT/ML ~~LOC~~ SOLN
SUBCUTANEOUS | Status: AC
  Administered 2012-12-12: 2 [IU] via SUBCUTANEOUS
  Filled 2012-12-12: qty 1

## 2012-12-12 MED ORDER — LACTATED RINGERS IV SOLN
INTRAVENOUS | Status: DC | PRN
  Administered 2012-12-12 (×3): via INTRAVENOUS

## 2012-12-12 MED ORDER — CIPROFLOXACIN IN D5W 400 MG/200ML IV SOLN
INTRAVENOUS | Status: AC
  Filled 2012-12-12: qty 200

## 2012-12-12 MED ORDER — ONDANSETRON HCL 4 MG PO TABS: 4.0000 mg | ORAL_TABLET | Freq: Four times a day (QID) | ORAL | Status: DC | PRN

## 2012-12-12 MED ORDER — BUPIVACAINE HCL (PF) 0.25 % IJ SOLN
INTRAMUSCULAR | Status: AC
  Filled 2012-12-12: qty 30

## 2012-12-12 MED ORDER — MORPHINE SULFATE (PF) 1 MG/ML IV SOLN
INTRAVENOUS | Status: DC
Start: 2012-12-12 — End: 2012-12-13
  Administered 2012-12-12: 1 mg via INTRAVENOUS
  Administered 2012-12-12: 12 mg via INTRAVENOUS
  Administered 2012-12-12: 25 mg via INTRAVENOUS
  Administered 2012-12-12: 1.5 mg via INTRAVENOUS
  Administered 2012-12-12 – 2012-12-13 (×2): via INTRAVENOUS
  Administered 2012-12-13: 43.16 mg via INTRAVENOUS
  Administered 2012-12-13: 15 mg via INTRAVENOUS
  Administered 2012-12-13: 18 mg via INTRAVENOUS
  Filled 2012-12-12 (×3): qty 25

## 2012-12-12 MED ORDER — STERILE WATER FOR IRRIGATION IR SOLN
Status: DC | PRN
  Administered 2012-12-12: 1000 mL

## 2012-12-12 MED ORDER — TISSEEL VH 10 ML EX KIT
PACK | CUTANEOUS | Status: DC | PRN
  Administered 2012-12-12: 10 mL

## 2012-12-12 MED ORDER — MIDAZOLAM HCL 5 MG/5ML IJ SOLN
INTRAMUSCULAR | Status: DC | PRN
  Administered 2012-12-12: 2 mg via INTRAVENOUS

## 2012-12-12 MED ORDER — CISATRACURIUM BESYLATE (PF) 10 MG/5ML IV SOLN
INTRAVENOUS | Status: DC | PRN
  Administered 2012-12-12: 6 mg via INTRAVENOUS
  Administered 2012-12-12 (×2): 4 mg via INTRAVENOUS
  Administered 2012-12-12: 6 mg via INTRAVENOUS
  Administered 2012-12-12 (×2): 2 mg via INTRAVENOUS
  Administered 2012-12-12: 6 mg via INTRAVENOUS
  Administered 2012-12-12: 16 mg via INTRAVENOUS

## 2012-12-12 MED ORDER — GLYCOPYRROLATE 0.2 MG/ML IJ SOLN
INTRAMUSCULAR | Status: DC | PRN
  Administered 2012-12-12: .8 mg via INTRAVENOUS

## 2012-12-12 MED ORDER — ONDANSETRON HCL 4 MG/2ML IJ SOLN
INTRAMUSCULAR | Status: DC | PRN
  Administered 2012-12-12: 4 mg via INTRAVENOUS

## 2012-12-12 MED ORDER — BUPIVACAINE 0.25 % ON-Q PUMP DUAL CATH 300 ML
300.0000 mL | INJECTION | Status: DC
  Administered 2012-12-12: 300 mL
  Filled 2012-12-12: qty 300

## 2012-12-12 MED ORDER — BUPIVACAINE ON-Q PAIN PUMP (FOR ORDER SET NO CHG)
INJECTION | Status: AC
  Filled 2012-12-12: qty 1

## 2012-12-12 MED ORDER — LACTATED RINGERS IV SOLN
INTRAVENOUS | Status: DC | PRN
  Administered 2012-12-12 (×2): via INTRAVENOUS

## 2012-12-12 MED ORDER — KETAMINE HCL 10 MG/ML IJ SOLN
INTRAMUSCULAR | Status: DC | PRN
  Administered 2012-12-12 (×4): 15 mg via INTRAVENOUS

## 2012-12-12 MED ORDER — PROMETHAZINE HCL 25 MG/ML IJ SOLN: 6.2500 mg | INTRAMUSCULAR | Status: DC | PRN

## 2012-12-12 MED ORDER — ENOXAPARIN SODIUM 40 MG/0.4ML ~~LOC~~ SOLN
40.0000 mg | SUBCUTANEOUS | Status: DC
  Administered 2012-12-13 – 2012-12-22 (×10): 40 mg via SUBCUTANEOUS
  Filled 2012-12-12 (×11): qty 0.4

## 2012-12-12 MED ORDER — ACETAMINOPHEN 10 MG/ML IV SOLN
1000.0000 mg | Freq: Four times a day (QID) | INTRAVENOUS | Status: AC
  Administered 2012-12-12 – 2012-12-13 (×4): 1000 mg via INTRAVENOUS
  Filled 2012-12-12 (×5): qty 100

## 2012-12-12 MED ORDER — NEOSTIGMINE METHYLSULFATE 1 MG/ML IJ SOLN
INTRAMUSCULAR | Status: DC | PRN
  Administered 2012-12-12: 5 mg via INTRAVENOUS

## 2012-12-12 MED ORDER — 0.9 % SODIUM CHLORIDE (POUR BTL) OPTIME
TOPICAL | Status: DC | PRN
  Administered 2012-12-12 (×2): 1000 mL

## 2012-12-12 MED ORDER — HYDROMORPHONE HCL PF 1 MG/ML IJ SOLN: 0.2500 mg | INTRAMUSCULAR | Status: DC | PRN

## 2012-12-12 MED ORDER — LIDOCAINE HCL (PF) 1 % IJ SOLN
INTRAMUSCULAR | Status: DC | PRN
  Administered 2012-12-12: 1 mL

## 2012-12-12 MED ORDER — PROPOFOL 10 MG/ML IV BOLUS
INTRAVENOUS | Status: DC | PRN
  Administered 2012-12-12: 160 mg via INTRAVENOUS

## 2012-12-12 MED ORDER — METRONIDAZOLE IN NACL 5-0.79 MG/ML-% IV SOLN
INTRAVENOUS | Status: AC
  Filled 2012-12-12: qty 100

## 2012-12-12 MED ORDER — LIDOCAINE-EPINEPHRINE 1 %-1:100000 IJ SOLN
INTRAMUSCULAR | Status: AC
  Filled 2012-12-12: qty 1

## 2012-12-12 MED ORDER — BUPIVACAINE HCL (PF) 0.25 % IJ SOLN
INTRAMUSCULAR | Status: DC | PRN
  Administered 2012-12-12: 1 mL

## 2012-12-12 MED ORDER — NALOXONE HCL 0.4 MG/ML IJ SOLN: 0.4000 mg | INTRAMUSCULAR | Status: DC | PRN

## 2012-12-12 MED ORDER — MORPHINE SULFATE 2 MG/ML IJ SOLN: 1.0000 mg | INTRAMUSCULAR | Status: DC | PRN

## 2012-12-12 MED ORDER — DIPHENHYDRAMINE HCL 50 MG/ML IJ SOLN: 12.5000 mg | Freq: Four times a day (QID) | INTRAMUSCULAR | Status: DC | PRN

## 2012-12-12 MED ORDER — INSULIN ASPART 100 UNIT/ML ~~LOC~~ SOLN
0.0000 [IU] | SUBCUTANEOUS | Status: DC
  Administered 2012-12-12 (×2): 2 [IU] via SUBCUTANEOUS
  Administered 2012-12-13 (×5): 1 [IU] via SUBCUTANEOUS
  Administered 2012-12-14 (×2): 2 [IU] via SUBCUTANEOUS
  Administered 2012-12-14: 1 [IU] via SUBCUTANEOUS
  Administered 2012-12-14: 2 [IU] via SUBCUTANEOUS
  Administered 2012-12-14 – 2012-12-16 (×12): 1 [IU] via SUBCUTANEOUS
  Administered 2012-12-17: 2 [IU] via SUBCUTANEOUS
  Administered 2012-12-17 (×3): 1 [IU] via SUBCUTANEOUS
  Administered 2012-12-17: 2 [IU] via SUBCUTANEOUS
  Administered 2012-12-18: 1 [IU] via SUBCUTANEOUS
  Administered 2012-12-18: 2 [IU] via SUBCUTANEOUS
  Administered 2012-12-18 – 2012-12-21 (×6): 1 [IU] via SUBCUTANEOUS

## 2012-12-12 MED ORDER — KCL IN DEXTROSE-NACL 20-5-0.45 MEQ/L-%-% IV SOLN
INTRAVENOUS | Status: DC
  Administered 2012-12-12: 16:00:00 via INTRAVENOUS
  Administered 2012-12-13 (×2): 100 mL/h via INTRAVENOUS
  Administered 2012-12-13 – 2012-12-15 (×4): via INTRAVENOUS
  Administered 2012-12-15: 100 mL/h via INTRAVENOUS
  Administered 2012-12-15: 1000 mL via INTRAVENOUS
  Administered 2012-12-16: 75 mL/h via INTRAVENOUS
  Administered 2012-12-16 – 2012-12-18 (×4): via INTRAVENOUS
  Filled 2012-12-12 (×14): qty 1000

## 2012-12-12 MED ORDER — MORPHINE SULFATE (PF) 1 MG/ML IV SOLN
INTRAVENOUS | Status: AC
  Administered 2012-12-12: 1 mg via INTRAVENOUS
  Filled 2012-12-12: qty 25

## 2012-12-12 MED ORDER — ONDANSETRON HCL 4 MG/2ML IJ SOLN
4.0000 mg | Freq: Four times a day (QID) | INTRAMUSCULAR | Status: DC | PRN
  Administered 2012-12-18: 4 mg via INTRAVENOUS
  Filled 2012-12-12: qty 2

## 2012-12-12 MED ORDER — ONDANSETRON HCL 4 MG/2ML IJ SOLN: 4.0000 mg | Freq: Four times a day (QID) | INTRAMUSCULAR | Status: DC | PRN

## 2012-12-12 MED ORDER — SUFENTANIL CITRATE 50 MCG/ML IV SOLN
INTRAVENOUS | Status: DC | PRN
  Administered 2012-12-12: 15 ug via INTRAVENOUS
  Administered 2012-12-12: 5 ug via INTRAVENOUS
  Administered 2012-12-12: 15 ug via INTRAVENOUS
  Administered 2012-12-12: 5 ug via INTRAVENOUS
  Administered 2012-12-12: 10 ug via INTRAVENOUS
  Administered 2012-12-12: 5 ug via INTRAVENOUS
  Administered 2012-12-12 (×3): 10 ug via INTRAVENOUS
  Administered 2012-12-12: 15 ug via INTRAVENOUS

## 2012-12-12 MED ORDER — SODIUM CHLORIDE 0.9 % IJ SOLN: 9.0000 mL | INTRAMUSCULAR | Status: DC | PRN

## 2012-12-12 MED ORDER — CIPROFLOXACIN IN D5W 400 MG/200ML IV SOLN
400.0000 mg | INTRAVENOUS | Status: AC
  Administered 2012-12-12: 400 mg via INTRAVENOUS

## 2012-12-12 SURGICAL SUPPLY — 134 items
BENZOIN TINCTURE PRP APPL 2/3 (GAUZE/BANDAGES/DRESSINGS) IMPLANT
BLADE EXTENDED COATED 6.5IN (ELECTRODE) ×2 IMPLANT
BLADE HEX COATED 2.75 (ELECTRODE) ×2 IMPLANT
BLADE SURG SZ10 CARB STEEL (BLADE) IMPLANT
BOOT SUTURE VASCULAR YLW (MISCELLANEOUS) ×2
CANISTER SUCTION 2500CC (MISCELLANEOUS) ×2 IMPLANT
CANNULA ENDOPATH XCEL 11M (ENDOMECHANICALS) IMPLANT
CATH FOLEY 2WAY SLVR  5CC 16FR (CATHETERS)
CATH FOLEY 2WAY SLVR 5CC 16FR (CATHETERS) IMPLANT
CATH KIT ON-Q SILVERSOAK 5IN (CATHETERS) IMPLANT
CATH KIT ON-Q SILVERSOAK 7.5IN (CATHETERS) IMPLANT
CATH ROBINSON RED A/P 12FR (CATHETERS) IMPLANT
CATH ROBINSON RED A/P 14FR (CATHETERS) IMPLANT
CATH ROBINSON RED A/P 16FR (CATHETERS) IMPLANT
CATH ROBINSON RED A/P 18FR (CATHETERS) IMPLANT
CATH ROBINSON RED A/P 20FR (CATHETERS) IMPLANT
CHLORAPREP W/TINT 26ML (MISCELLANEOUS) ×2 IMPLANT
CLAMP SUTURE YELLOW 5 PAIRS (MISCELLANEOUS) ×2 IMPLANT
CLIP LIGATING HEM O LOK PURPLE (MISCELLANEOUS) ×10 IMPLANT
CLIP LIGATING HEMO O LOK GREEN (MISCELLANEOUS) ×2 IMPLANT
CLIP LIGATING HEMOLOK MED (MISCELLANEOUS) IMPLANT
CLIP TI LARGE 6 (CLIP) ×2 IMPLANT
CLIP TI MEDIUM 6 (CLIP) ×6 IMPLANT
CLOTH BEACON ORANGE TIMEOUT ST (SAFETY) ×2 IMPLANT
CLSR STERI-STRIP ANTIMIC 1/2X4 (GAUZE/BANDAGES/DRESSINGS) ×2 IMPLANT
CONT SPECI 4OZ STER CLIK (MISCELLANEOUS) IMPLANT
COVER MAYO STAND STRL (DRAPES) IMPLANT
CUTTER FLEX LINEAR 45M (STAPLE) IMPLANT
DECANTER SPIKE VIAL GLASS SM (MISCELLANEOUS) ×2 IMPLANT
DERMABOND ADVANCED (GAUZE/BANDAGES/DRESSINGS)
DERMABOND ADVANCED .7 DNX12 (GAUZE/BANDAGES/DRESSINGS) IMPLANT
DISSECTOR ROUND CHERRY 3/8 STR (MISCELLANEOUS) IMPLANT
DRAIN CHANNEL RND F F (WOUND CARE) ×4 IMPLANT
DRAPE C-ARM 42X72 X-RAY (DRAPES) IMPLANT
DRAPE CAMERA CLOSED 9X96 (DRAPES) IMPLANT
DRAPE LG THREE QUARTER DISP (DRAPES) IMPLANT
DRAPE TOWEL STR TPT 18X26 WHT (DRAPES) ×4 IMPLANT
DRAPE UTILITY XL STRL (DRAPES) ×2 IMPLANT
DRAPE WARM FLUID 44X44 (DRAPE) ×2 IMPLANT
DRESSING TELFA ISLAND 4X8 (GAUZE/BANDAGES/DRESSINGS) IMPLANT
DRSG PAD ABDOMINAL 8X10 ST (GAUZE/BANDAGES/DRESSINGS) IMPLANT
DRSG TEGADERM 4X4.75 (GAUZE/BANDAGES/DRESSINGS) ×2 IMPLANT
DRSG TELFA 4X10 ISLAND STR (GAUZE/BANDAGES/DRESSINGS) ×4 IMPLANT
DRSG TELFA PLUS 4X6 ADH ISLAND (GAUZE/BANDAGES/DRESSINGS) IMPLANT
ELECT REM PT RETURN 9FT ADLT (ELECTROSURGICAL) ×2
ELECTRODE REM PT RTRN 9FT ADLT (ELECTROSURGICAL) ×1 IMPLANT
EVACUATOR DRAINAGE 10X20 100CC (DRAIN) ×2 IMPLANT
EVACUATOR SILICONE 100CC (DRAIN) ×2 IMPLANT
GAUZE SPONGE 4X4 16PLY XRAY LF (GAUZE/BANDAGES/DRESSINGS) ×2 IMPLANT
GLOVE BIO SURGEON STRL SZ 6 (GLOVE) ×2 IMPLANT
GLOVE INDICATOR 6.5 STRL GRN (GLOVE) ×4 IMPLANT
GOWN PREVENTION PLUS XLARGE (GOWN DISPOSABLE) ×2 IMPLANT
GOWN PREVENTION PLUS XXLARGE (GOWN DISPOSABLE) ×2 IMPLANT
GOWN STRL NON-REIN LRG LVL3 (GOWN DISPOSABLE) IMPLANT
GOWN STRL REIN 2XL LVL4 (GOWN DISPOSABLE) ×2 IMPLANT
HAND ACTIVATED (MISCELLANEOUS) IMPLANT
HEMOSTAT SURGICEL 4X8 (HEMOSTASIS) IMPLANT
KIT BASIN OR (CUSTOM PROCEDURE TRAY) ×2 IMPLANT
LOOP MINI RED (MISCELLANEOUS) IMPLANT
LOOP VESSEL MAXI BLUE (MISCELLANEOUS) ×2 IMPLANT
NEEDLE BIOPSY 14GX4.5 SOFT TIS (NEEDLE) IMPLANT
NEEDLE HYPO 22GX1.5 SAFETY (NEEDLE) ×2 IMPLANT
NS IRRIG 1000ML POUR BTL (IV SOLUTION) ×4 IMPLANT
PACK GENERAL/GYN (CUSTOM PROCEDURE TRAY) ×2 IMPLANT
PACK UNIVERSAL I (CUSTOM PROCEDURE TRAY) ×2 IMPLANT
PLUG CATH AND CAP STER (CATHETERS) IMPLANT
RELOAD 45 VASCULAR/THIN (ENDOMECHANICALS) IMPLANT
RELOAD PROXIMATE 75MM BLUE (ENDOMECHANICALS) ×6 IMPLANT
SEPRAFILM PROCEDURAL PACK 3X5 (MISCELLANEOUS) ×2 IMPLANT
SHEARS FOC LG CVD HARMONIC 17C (MISCELLANEOUS) IMPLANT
SLEEVE SURGEON STRL (DRAPES) IMPLANT
SLEEVE XCEL OPT CAN 5 100 (ENDOMECHANICALS) IMPLANT
SOLUTION ANTI FOG 6CC (MISCELLANEOUS) ×2 IMPLANT
SPONGE DRAIN TRACH 4X4 STRL 2S (GAUZE/BANDAGES/DRESSINGS) IMPLANT
SPONGE GAUZE 4X4 12PLY (GAUZE/BANDAGES/DRESSINGS) IMPLANT
SPONGE LAP 18X18 X RAY DECT (DISPOSABLE) ×4 IMPLANT
SPONGE SURGIFOAM ABS GEL 100 (HEMOSTASIS) IMPLANT
STAPLER PROXIMATE 75MM BLUE (STAPLE) ×2 IMPLANT
STAPLER VISISTAT 35W (STAPLE) IMPLANT
STRIP CLOSURE SKIN 1/2X4 (GAUZE/BANDAGES/DRESSINGS) IMPLANT
SUCTION POOLE TIP (SUCTIONS) ×2 IMPLANT
SUT 5.0 PDS RB-1 (SUTURE) ×6
SUT CHROMIC 3 0 SH 27 (SUTURE) IMPLANT
SUT CHROMIC 4 0 RB 1X27 (SUTURE) IMPLANT
SUT ETHIBOND 2-0 60INL (SUTURE) IMPLANT
SUT ETHILON 2 0 PS N (SUTURE) IMPLANT
SUT ETHILON 3 0 PS 1 (SUTURE) ×4 IMPLANT
SUT MNCRL AB 4-0 PS2 18 (SUTURE) IMPLANT
SUT PDS AB 1 TP1 54 (SUTURE) IMPLANT
SUT PDS AB 3-0 SH 27 (SUTURE) ×8 IMPLANT
SUT PDS AB 4-0 RB1 27 (SUTURE) ×24 IMPLANT
SUT PDS PLUS AB 5-0 RB-1 (SUTURE) ×6 IMPLANT
SUT PROLENE 3 0 SH 48 (SUTURE) IMPLANT
SUT PROLENE 3 0 SH1 36 (SUTURE) ×4 IMPLANT
SUT PROLENE 4 0 RB 1 (SUTURE) ×2
SUT PROLENE 4-0 RB1 .5 CRCL 36 (SUTURE) ×2 IMPLANT
SUT PROLENE 5 0 CC 1 (SUTURE) IMPLANT
SUT SILK 0 FSL (SUTURE) ×2 IMPLANT
SUT SILK 2 0 (SUTURE) ×2
SUT SILK 2 0 SH CR/8 (SUTURE) ×6 IMPLANT
SUT SILK 2-0 18XBRD TIE 12 (SUTURE) ×2 IMPLANT
SUT SILK 3 0 (SUTURE) ×1
SUT SILK 3 0 SH CR/8 (SUTURE) ×4 IMPLANT
SUT SILK 3-0 18XBRD TIE 12 (SUTURE) ×1 IMPLANT
SUT VIC AB 3-0 SH 18 (SUTURE) IMPLANT
SUT VIC AB 3-0 SH 27 (SUTURE) ×2
SUT VIC AB 3-0 SH 27XBRD (SUTURE) ×2 IMPLANT
SUT VIC AB 4-0 PS2 27 (SUTURE) IMPLANT
SUT VIC AB 4-0 SH 18 (SUTURE) IMPLANT
SUT VICRYL 2 0 18  UND BR (SUTURE) ×1
SUT VICRYL 2 0 18 UND BR (SUTURE) ×1 IMPLANT
SUT VICRYL 3 0 BR 18  UND (SUTURE) ×1
SUT VICRYL 3 0 BR 18 UND (SUTURE) ×1 IMPLANT
SYR CONTROL 10ML LL (SYRINGE) ×2 IMPLANT
SYRINGE 10CC LL (SYRINGE) IMPLANT
SYS LAPSCP GELPORT 120MM (MISCELLANEOUS)
SYSTEM LAPSCP GELPORT 120MM (MISCELLANEOUS) IMPLANT
TAG SUTURE CLAMP YLW 5PR (MISCELLANEOUS) ×2
TAPE CLOTH SURG 4X10 WHT LF (GAUZE/BANDAGES/DRESSINGS) ×2 IMPLANT
TAPE UMBILICAL COTTON 1/8X30 (MISCELLANEOUS) ×2 IMPLANT
TOWEL BLUE STERILE X RAY DET (MISCELLANEOUS) IMPLANT
TOWEL OR 17X26 10 PK STRL BLUE (TOWEL DISPOSABLE) ×8 IMPLANT
TRAY FOLEY CATH 14FRSI W/METER (CATHETERS) ×2 IMPLANT
TRAY LAP CHOLE (CUSTOM PROCEDURE TRAY) ×2 IMPLANT
TROCAR BLADELESS OPT 5 100 (ENDOMECHANICALS) IMPLANT
TROCAR BLADELESS OPT 5 75 (ENDOMECHANICALS) ×2 IMPLANT
TROCAR XCEL BLUNT TIP 100MML (ENDOMECHANICALS) ×2 IMPLANT
TROCAR XCEL NON-BLD 11X100MML (ENDOMECHANICALS) IMPLANT
TUBE FEEDING 5FR 36IN KANGAROO (TUBING) IMPLANT
TUBE FEEDING 8FR 16IN STR KANG (MISCELLANEOUS) IMPLANT
TUBING INSUFFLATION 10FT LAP (TUBING) ×2 IMPLANT
TUNNELER SHEATH ON-Q 16GX12 DP (PAIN MANAGEMENT) ×2 IMPLANT
WATER STERILE IRR 1500ML POUR (IV SOLUTION) IMPLANT
YANKAUER SUCT BULB TIP NO VENT (SUCTIONS) ×2 IMPLANT

## 2012-12-12 NOTE — Anesthesia Postprocedure Evaluation (Signed)
  Anesthesia Post-op Note  Patient: Hector Pugh  Procedure(s) Performed: Procedure(s) (LRB): LAPAROSCOPY DIAGNOSTIC (N/A) WHIPPLE PROCEDURE (N/A)  Patient Location: PACU  Anesthesia Type: General  Level of Consciousness: awake and alert   Airway and Oxygen Therapy: Patient Spontanous Breathing  Post-op Pain: mild  Post-op Assessment: Post-op Vital signs reviewed, Patient's Cardiovascular Status Stable, Respiratory Function Stable, Patent Airway and No signs of Nausea or vomiting  Last Vitals:  Filed Vitals:   12/12/12 1446  BP:   Pulse:   Temp:   Resp: 10    Post-op Vital Signs: stable   Complications: No apparent anesthesia complications

## 2012-12-12 NOTE — Anesthesia Preprocedure Evaluation (Signed)
Anesthesia Evaluation  Patient identified by MRN, date of birth, ID band Patient awake    Reviewed: Allergy & Precautions, H&P , NPO status , Patient's Chart, lab work & pertinent test results  Airway Mallampati: II TM Distance: >3 FB Neck ROM: Full    Dental No notable dental hx.    Pulmonary neg pulmonary ROS,  breath sounds clear to auscultation  Pulmonary exam normal       Cardiovascular hypertension, Pt. on medications Rhythm:Regular Rate:Normal     Neuro/Psych negative neurological ROS  negative psych ROS   GI/Hepatic negative GI ROS, Neg liver ROS,   Endo/Other  diabetes, Type 2  Renal/GU negative Renal ROS  negative genitourinary   Musculoskeletal negative musculoskeletal ROS (+)   Abdominal   Peds negative pediatric ROS (+)  Hematology negative hematology ROS (+)   Anesthesia Other Findings   Reproductive/Obstetrics negative OB ROS                           Anesthesia Physical Anesthesia Plan  ASA: II  Anesthesia Plan: General   Post-op Pain Management:    Induction: Intravenous  Airway Management Planned: Oral ETT  Additional Equipment: Arterial line  Intra-op Plan:   Post-operative Plan: Extubation in OR  Informed Consent: I have reviewed the patients History and Physical, chart, labs and discussed the procedure including the risks, benefits and alternatives for the proposed anesthesia with the patient or authorized representative who has indicated his/her understanding and acceptance.   Dental advisory given  Plan Discussed with: CRNA and Surgeon  Anesthesia Plan Comments:         Anesthesia Quick Evaluation

## 2012-12-12 NOTE — Transfer of Care (Signed)
Immediate Anesthesia Transfer of Care Note  Patient: Hector Pugh  Procedure(s) Performed: Procedure(s) (LRB) with comments: LAPAROSCOPY DIAGNOSTIC (N/A) WHIPPLE PROCEDURE (N/A)  Patient Location: PACU  Anesthesia Type:General  Level of Consciousness: awake, sedated and patient cooperative  Airway & Oxygen Therapy: Patient Spontanous Breathing and Patient connected to face mask oxygen  Post-op Assessment: Report given to PACU RN, Post -op Vital signs reviewed and stable and Patient moving all extremities  Post vital signs: Reviewed and stable  Complications: No apparent anesthesia complications

## 2012-12-12 NOTE — Interval H&P Note (Signed)
History and Physical Interval Note:  12/12/2012 7:16 AM  [name]  has presented today for surgery, with the diagnosis of ampullary carcinoma  The various methods of treatment have been discussed with the patient and family. After consideration of risks, benefits and other options for treatment, the patient has consented to  Procedure(s) (LRB) with comments: LAPAROSCOPY DIAGNOSTIC (N/A) WHIPPLE PROCEDURE (N/A) as a surgical intervention .  The patient's history has been reviewed, patient examined, no change in status, stable for surgery.  I have reviewed the patient's chart and labs.  Questions were answered to the patient's satisfaction.     Hector Pugh

## 2012-12-12 NOTE — Preoperative (Signed)
Beta Blockers   Reason not to administer Beta Blockers:Took Metoprolol this am. 

## 2012-12-12 NOTE — Op Note (Signed)
PREOPERATIVE DIAGNOSIS: Ampullary carcinoma  POSTOPERATIVE DIAGNOSIS: Same.   PROCEDURES PERFORMED:  1.  Diagnostic laparoscopy 2. Classic pancreaticoduodenectomy with duct to mucosa pancreaticojejunostomy 3. Placement of 5 fr pancreatic stent  4.  Placement of OnQ pain pump  SURGEON: Almond Lint, MD   ASSISTANT: Ovidio Kin, MD   ANESTHESIA: General and epidural   FINDINGS: Inflammatory reaction consistent with recent pancreatitis.  1 cm ampullary mass. Firm pancreatic tissue. 8 mm common bile duct. Dilated   SPECIMENS:  1. Pancreaticoduodenectomy with gallbladder: Findings on frozen section of the  pancreaticoduodenectomy were negative for high grade dysplasia or malignancy at the retroperitoneal, common bile duct and pancreatic duct margin. Specimens all to Pathology.   ESTIMATED BLOOD LOSS: 550 mL.   COMPLICATIONS: None known.   PROCEDURE:   Pt was identified in the holding area and taken to  the operating room, and placed supine on the operating room  table. General anesthesia was induced. The patient's abdomen was  prepped and draped in a sterile fashion, after a Foley catheter was  placed. A time-out was performed according to the surgical safety check  list. When all was correct we continued.   A 5 mm Optiview port was placed in the LUQ at the costal margin under direct visualization.  Pneumoperitoneum was achieved to a pressure of 15 mm Hg.  No evidence of metastatic disease was seen.    A midline incision was made from the xiphoid to just above the umbilicus. This was extended laterally toward the right. The subcutaneous tissues were divided with the Bovie cautery. The peritoneum was entered in the center of the abdomen. Digital retraction was then used to elevate the preperitoneal fat, and  this was taken with the cautery as well. The subcutaneous tissues and  fascia of the muscular layers were taken laterally with the cautery.  Care was taken to protect the  underlying viscera.   Once this had been performed, the point of the incision was reflected up to the skin to  facilitate retraction. Bookwalter self-retaining retractor was placed  for visualization. The right colon was taken down off of the white line  of Toldt and from the retroperitoneum at the hepatic flexure. The porta was identified. The  duodenum was kocherized extensively with blunt dissection and with cautery. The gallbladder was taken off the liver with a combination of blunt dissection and cautery. The cystic duct was clamped and divided.  The remnant was suture ligated.   The entire retroperitoneum was extremely inflammatory secondary to pancreatitis.    The common bile duct was skeletonized near the duodenum. A vessel loop was passed around it. The gastroduodenal artery, as well as the common hepatic artery were skeletonized. The proper hepatic artery was traced out to make sure that flow was going to both sides of the liver when the GDA was clamped. The GDA was test clamped with the bulldog, with good flow to the liver and no signs of ischemia. This was divided with 2-0 silk ties and then clipped. The proper hepatic artery was reflected upward, and the anterior portal vein was exposed. A Kelly clamp was passed underneath the pancreas at the superior mesenteric vein, and this passed with no signs of tumor involvement.   Attention was then directed to the stomach, and the omentum was taken  off of the stomach at the border of the antrum and the body. The  gastrohepatic ligament was taken down with the harmonic, and care was  taken to make sure there  was not a replaced left hepatic artery in this  location. The stomach was divided with the GIA-75 stapler. The border  of the stomach was oversewn with a 3-0 running PDS suture.   Attention was then directed to the small bowel. Around 10 cm past the  ligament of Treitz it was located, and this was divided with the 75-GIA.  The distal  portion of the jejunum was also oversewn with a 3-0 PDS  suture. The fourth portion of the duodenum was skeletonized with the  harmonic scalpel, taking down all of the mesenteric vessels. The  ligament of Treitz was taken down. The IMV was preserved.  The duodenum was then passed underneath the portal vein.   At this point the Tresa Endo was replaced and the pancreas was divided with the cautery.   2-0 silk sutures were tied down and the inferior and superior border of the pancreas. The Bovie was used to coagulate the small bleeders at the border of the pancreas.  The Overholt in combination with the harmonic and locking Weck clips  were then used to take the uncinate process off of the portal vein and  the superior mesenteric artery. Care was taken not to incorporate the  superior mesenteric artery in the dissection. The specimen was then marked and passed off the table for frozen section margin.   The jejunum was then passed underneath  the SMV, in order to get appropriate lie for the pancreatic and biliary  anastomoses. The more distal portion of the jejunum was pulled up over  the colon, and two 3-0 silks were placed through the posterior border  of the stomach for the gastrojejunostomy. The stomach and the small  bowel were opened, and a GIA-75 was used to create an end-to-end  anastomosis. The open areas of the staple line were examined to ensure  that there was hemostasis. The defect was then closed with a single  layer of running Connell suture of 3-0 PDS. Prior to a complete  closure, the NG tube was passed toward the afferent limb.   The appropriate location for the choledochojejunostomy was identified, and  the small bowel was opened approximately 8 mm. This was done with 11 interrupted 4-0 PDS sutures.  The 2 corner sutures were placed first  and then the posterior layer was done in an interrupted fashion tying on  the inside. The superior layer was then closed with interrupted  sutures as  well.   At this point the frozens returned back as all negative. The pancreatic  anastomosis was then created by opening the jejunal muscular layer the length  of the pancreatic parenchyma. The mucosa was opened approximately 5 mm.  The pancreas was very firm.  The posterior layer was formed first with 2-0 silk sutures in interrupted  fashion. 6 5-0 PDS sutures were used to create the duct to mucosa anastamosis.  The anterior layer was then oversewn with 2-0 silks to dunk the pancreatic parenchyma.   The areas were then irrigated and then those anastomoses were covered  with Tisseal. This was allowed to dry.  The abdomen was then irrigated  again and all the laparotomy sponges were removed. A lap count was  performed, which was correct. Two 19-Blake drains were placed, with the  lateral-most drain placed behind the choledochojejunostomy (biliary anastamosis). The medial  Blake drain was placed just anterior and slightly superior to the  Pancreaticojejunostomy (pancreatic anastamosis). The fascia was then closed with #1 looped running PDS sutures, with 2  layers on the lateral aspect.  The OnQ tunnelers were placed in the preperitoneal space prior to fascial closure. The skin was irrigated and then closed with  staples. The wounds were cleaned, dried and dressed with a sterile  dressing.   The patient tolerated the procedure well and was extubated and taken to  PACU in stable condition. Instrument and sponge counts were correct x2. The needle count was off by one.  Xray was negative for retained foreign body.

## 2012-12-12 NOTE — H&P (View-Only) (Signed)
Patient ID: Hector Pugh, male   DOB: July 01, 1961, 52 y.o.   MRN: [id number]  Chief Complaint  Patient presents with  . Pre-op Exam    whipple    HPI Hector Pugh is a 52 y.o. male.   HPI Patient is a 52 year old male who presented with painless jaundice. He then got post ERCP pancreatitis. He was discharged to home prior to Christmas. 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 Biopsies were positive for dysplasia or carcinoma in situ but not for frank carcinoma. He has been doing better since being at home. He is a lot better. His itching has resolved. He has not continued to lose weight.  Past Medical History  Diagnosis Date  . Diabetes mellitus without complication   . Hypertension   . Diabetes mellitus 11/10/2012  . HTN (hypertension) 11/10/2012  . Pancreatitis 11/10/2012  . 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 Biliary stricture 11/11/2012    S/p biliary stent 11/08/12    Past Surgical History  Procedure Date  . Circucision   . Hernia repair     RIGHT  . Left foot surgery   . Vasectomy   . Circumsision   . Left foot surgery   . Eus 11/08/2012    Procedure: UPPER ENDOSCOPIC ULTRASOUND (EUS) LINEAR;  Surgeon: Willis Modena, MD;  Location: [facility];  Service: Endoscopy;  Laterality: N/A;  . Fine needle aspiration 11/08/2012    Procedure: FINE NEEDLE ASPIRATION (FNA) LINEAR;  Surgeon: Willis Modena, MD;  Location: [facility];  Service: Endoscopy;  Laterality: N/A;  . Ercp 11/08/2012    Procedure: ENDOSCOPIC RETROGRADE CHOLANGIOPANCREATOGRAPHY (ERCP);  Surgeon: Petra Kuba, MD;  Location: [facility];  Service: Gastroenterology;  Laterality: N/A;    Family History  Problem Relation Age of Onset  . Cancer Mother     esophageal carcinoma    Social History History  Substance Use Topics  . Smoking status: Never Smoker   . Smokeless tobacco: Never Used  . Alcohol Use: No    No Known  Allergies  Current Outpatient Prescriptions  Medication Sig Dispense Refill  . loratadine (CLARITIN) 10 MG tablet Take 10 mg by mouth daily as needed. For allergies      . metoprolol succinate (TOPROL-XL) 100 MG 24 hr tablet Take 100 mg by mouth every morning. Take with or immediately following a meal.        Review of Systems Review of Systems  Constitutional: Positive for unexpected weight change.  Gastrointestinal: Positive for abdominal pain (improved from pre op).    Blood pressure 136/90, pulse 56, temperature 98.9 F (37.2 C), temperature source Temporal, resp. rate 16, height 5\' 2"  (1.575 m), weight 149 lb (67.586 kg).  Physical Exam Physical Exam  Constitutional: He is oriented to person, place, and time. He appears well-developed and well-nourished. No distress.  HENT:  Head: Normocephalic and atraumatic.  Cardiovascular: Normal rate and regular rhythm.   Pulmonary/Chest: Effort normal. No respiratory distress.  Abdominal: Soft. He exhibits no distension. There is no tenderness.  Musculoskeletal: Normal range of motion.  Neurological: He is alert and oriented to person, place, and time.  Skin: Skin is warm and dry. He is not diaphoretic.  Psychiatric: He has a normal mood and affect. His behavior is normal. Judgment and thought content normal.   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 The patient had a second opinion at  [facility] and prefers to stay in [place] for surgical treatment.  I discussed the surgery with the patient including diagrams of anatomy.  I discussed the potential for diagnostic laparoscopy.  In the case of pancreatic cancer, if spread of the disease is found, we will abort the procedure and not proceed with resection.  The rationale for this was discussed with the patient.  There has not been data to support resection of Stage IV disease in terms of survival benefit.    We discussed  possible complications including: Potential of aborting procedure if tumor is invading the superior mesenteric or hepatic arteries Bleeding Infection and possible wound complications such as hernia Damage to adjacent structures Leak of anastamoses, primarily pancreatic Possible need for other procedures Possible prolonged hospital stay Possible development of diabetes or worsening of current diabetes.  Possible pancreatic exocrine insufficiency Prolonged fatigue/weakness/appetite Difficulty with eating or post operative nausea Possible early recurrence of cancer   The patient understands and wishes to proceed.  The patient has been advised to turn in disability paperwork to our office.             [name] 12/06/2012, 5:08 PM

## 2012-12-13 ENCOUNTER — Encounter (HOSPITAL_COMMUNITY): Payer: Self-pay | Admitting: General Surgery

## 2012-12-13 DIAGNOSIS — E861 Hypovolemia: Secondary | ICD-10-CM

## 2012-12-13 DIAGNOSIS — K838 Other specified diseases of biliary tract: Secondary | ICD-10-CM

## 2012-12-13 DIAGNOSIS — I959 Hypotension, unspecified: Secondary | ICD-10-CM

## 2012-12-13 LAB — CBC
HCT: 35.1 % — ABNORMAL LOW (ref 39.0–52.0)
Hemoglobin: 11.9 g/dL — ABNORMAL LOW (ref 13.0–17.0)
Platelets: 225 10*3/uL (ref 150–400)
RBC: 4.02 MIL/uL — ABNORMAL LOW (ref 4.22–5.81)
RDW: 14.2 % (ref 11.5–15.5)
WBC: 12.1 10*3/uL — ABNORMAL HIGH (ref 4.0–10.5)
WBC: 9.8 10*3/uL (ref 4.0–10.5)

## 2012-12-13 LAB — PROTIME-INR: INR: 1.22 (ref 0.00–1.49)

## 2012-12-13 LAB — GLUCOSE, CAPILLARY
Glucose-Capillary: 127 mg/dL — ABNORMAL HIGH (ref 70–99)
Glucose-Capillary: 159 mg/dL — ABNORMAL HIGH (ref 70–99)

## 2012-12-13 LAB — BASIC METABOLIC PANEL
BUN: 9 mg/dL (ref 6–23)
Chloride: 97 mEq/L (ref 96–112)
GFR calc non Af Amer: >90 mL/min (ref >90)
Glucose, Bld: 150 mg/dL — ABNORMAL HIGH (ref 70–99)
Potassium: 4.5 mEq/L (ref 3.5–5.1)
Sodium: 133 mEq/L — ABNORMAL LOW (ref 135–145)

## 2012-12-13 LAB — COMPREHENSIVE METABOLIC PANEL
AST: 37 U/L (ref 0–37)
Albumin: 2.9 g/dL — ABNORMAL LOW (ref 3.5–5.2)
Alkaline Phosphatase: 38 U/L — ABNORMAL LOW (ref 39–117)
Chloride: 96 mEq/L (ref 96–112)
Potassium: 4.4 mEq/L (ref 3.5–5.1)
Total Bilirubin: 1.2 mg/dL (ref 0.3–1.2)
Total Protein: 5.4 g/dL — ABNORMAL LOW (ref 6.0–8.3)

## 2012-12-13 LAB — HEMOGLOBIN A1C
Hgb A1c MFr Bld: 6.4 % — ABNORMAL HIGH (ref <5.7)
Mean Plasma Glucose: 137 mg/dL — ABNORMAL HIGH (ref <117)

## 2012-12-13 LAB — PHOSPHORUS: Phosphorus: 4.6 mg/dL (ref 2.3–4.6)

## 2012-12-13 MED ORDER — ONDANSETRON HCL 4 MG/2ML IJ SOLN: 4.0000 mg | Freq: Four times a day (QID) | INTRAMUSCULAR | Status: DC | PRN

## 2012-12-13 MED ORDER — SODIUM CHLORIDE 0.9 % IJ SOLN: 9.0000 mL | INTRAMUSCULAR | Status: DC | PRN

## 2012-12-13 MED ORDER — BIOTENE DRY MOUTH MT LIQD
15.0000 mL | Freq: Two times a day (BID) | OROMUCOSAL | Status: DC
  Administered 2012-12-13 – 2012-12-19 (×12): 15 mL via OROMUCOSAL

## 2012-12-13 MED ORDER — MORPHINE SULFATE (PF) 1 MG/ML IV SOLN
INTRAVENOUS | Status: DC
  Administered 2012-12-13: 24 mg via INTRAVENOUS
  Administered 2012-12-13: 16:00:00 via INTRAVENOUS
  Administered 2012-12-13: 28.5 mg via INTRAVENOUS
  Administered 2012-12-13: via INTRAVENOUS
  Administered 2012-12-13: 30 mg via INTRAVENOUS
  Administered 2012-12-13: 13:00:00 via INTRAVENOUS
  Administered 2012-12-13: 18.85 mg via INTRAVENOUS
  Administered 2012-12-13 (×2): via INTRAVENOUS
  Administered 2012-12-14: 15.42 mg via INTRAVENOUS
  Administered 2012-12-14: 22 mg via INTRAVENOUS
  Administered 2012-12-14: 34 mg via INTRAVENOUS
  Administered 2012-12-14: 20 mg via INTRAVENOUS
  Administered 2012-12-14 (×2): via INTRAVENOUS
  Administered 2012-12-14: 20 mg via INTRAVENOUS
  Administered 2012-12-14 (×2): via INTRAVENOUS
  Administered 2012-12-15: 13.4 mg via INTRAVENOUS
  Administered 2012-12-15: 25.35 mg via INTRAVENOUS
  Administered 2012-12-15: 33.09 mg via INTRAVENOUS
  Administered 2012-12-15: 18:00:00 via INTRAVENOUS
  Administered 2012-12-15: 12 mg via INTRAVENOUS
  Administered 2012-12-15: 24 mg via INTRAVENOUS
  Administered 2012-12-15: 08:00:00 via INTRAVENOUS
  Administered 2012-12-15: 25 mg via INTRAVENOUS
  Administered 2012-12-15: 13:00:00 via INTRAVENOUS
  Administered 2012-12-15: 14 mg via INTRAVENOUS
  Administered 2012-12-15 – 2012-12-16 (×3): via INTRAVENOUS
  Administered 2012-12-16: 20 mg via INTRAVENOUS
  Administered 2012-12-16: 32 mg via INTRAVENOUS
  Administered 2012-12-16 (×2): 1 mg via INTRAVENOUS
  Administered 2012-12-16: 09:00:00 via INTRAVENOUS
  Administered 2012-12-16: 21 mg via INTRAVENOUS
  Administered 2012-12-16: 22 mg via INTRAVENOUS
  Filled 2012-12-13 (×21): qty 25

## 2012-12-13 MED ORDER — MAGNESIUM SULFATE 40 MG/ML IJ SOLN
2.0000 g | Freq: Once | INTRAMUSCULAR | Status: AC
  Administered 2012-12-13: 2 g via INTRAVENOUS
  Filled 2012-12-13: qty 50

## 2012-12-13 MED ORDER — NALOXONE HCL 0.4 MG/ML IJ SOLN: 0.4000 mg | INTRAMUSCULAR | Status: DC | PRN

## 2012-12-13 MED ORDER — CHLORHEXIDINE GLUCONATE 0.12 % MT SOLN
15.0000 mL | Freq: Two times a day (BID) | OROMUCOSAL | Status: DC
  Administered 2012-12-13 – 2012-12-21 (×17): 15 mL via OROMUCOSAL
  Filled 2012-12-13 (×21): qty 15

## 2012-12-13 MED ORDER — LACTATED RINGERS IV BOLUS (SEPSIS)
1000.0000 mL | Freq: Once | INTRAVENOUS | Status: AC
  Administered 2012-12-13: 1000 mL via INTRAVENOUS

## 2012-12-13 MED ORDER — DIPHENHYDRAMINE HCL 12.5 MG/5ML PO ELIX: 12.5000 mg | ORAL_SOLUTION | Freq: Four times a day (QID) | ORAL | Status: DC | PRN

## 2012-12-13 MED ORDER — DIPHENHYDRAMINE HCL 50 MG/ML IJ SOLN
12.5000 mg | Freq: Four times a day (QID) | INTRAMUSCULAR | Status: DC | PRN
  Administered 2012-12-16: 12.5 mg via INTRAVENOUS
  Filled 2012-12-13: qty 1

## 2012-12-13 NOTE — Progress Notes (Signed)
1 Day Post-Op  Subjective: Patient complaining of pain of 7/10 this morning. He has been pressing his PCA button and states it gives him some relief, but it does not make the pain go completely away. Overnight, he had some hypotension into the 80s and some periods of low urine output. He is feeling better this morning. His drain output has been reasonable as well.   Objective: Vital signs in last 24 hours: Temp:  [97.6 F (36.4 C)-99.8 F (37.7 C)] 99.8 F (37.7 C) (01/10 0800) Pulse Rate:  [65-93] 82  (01/10 0900) Resp:  [8-19] 14  (01/10 0900) BP: (88-168)/(56-103) 109/67 mmHg (01/10 0900) SpO2:  [97 %-100 %] 100 % (01/10 0900) Arterial Line BP: (73-177)/(44-98) 95/65 mmHg (01/10 0900) Weight:  [154 lb 1.6 oz (69.9 kg)-154 lb 15.7 oz (70.3 kg)] 154 lb 15.7 oz (70.3 kg) (01/10 0400)    Intake/Output from previous day: 01/09 0701 - 01/10 0700 In: 6747.3 [I.V.:6027.3; NG/GT:20; IV Piggyback:700] Out: 2547 [Urine:1805; Emesis/NG output:5; Drains:137; Blood:600] Intake/Output this shift: Total I/O In: 150 [I.V.:100; IV Piggyback:50] Out: 75 [Urine:75]  General appearance: alert, cooperative and mild distress Head: Normocephalic, without obvious abnormality, atraumatic Resp: breathing comfortably GI: soft, sl distended, dressings c/d/i.  approp tender at incision.  Drains serosang Extremities: extremities normal, atraumatic, no cyanosis or edema  Lab Results:   Basename 12/13/12 0415 12/12/12 1615  WBC 12.1* 17.3*  HGB 11.7* 13.5  HCT 33.6* 38.2*  PLT 225 221   BMET  Basename 12/13/12 0415 12/12/12 1615 12/12/12 1215  NA 130* -- 134*  K 4.4 -- 3.8  CL 96 -- --  CO2 25 -- --  GLUCOSE 150* -- 168*  BUN 13 -- --  CREATININE 1.06 0.57 --  CALCIUM 8.1* -- --   PT/INR  Basename 12/13/12 0415  LABPROT 15.2  INR 1.22   ABG No results found for this basename: PHART:2,PCO2:2,PO2:2,HCO3:2 in the last 72 hours  Studies/Results: Dg Abd Portable 1v  12/12/2012  *RADIOLOGY  REPORT*  Clinical Data: Abnormal needle count.  PORTABLE ABDOMEN - 1 VIEW  Comparison: None.  Findings: There are too large intra abdominal drainage catheters and and NG tube in place.  Surgical skin staples are noted also. No definite unexpected metallic foreign bodies identified.  IMPRESSION: Negative for metallic foreign body (suture needle).   Original Report Authenticated By: Rudie Meyer, M.D.     Anti-infectives: Anti-infectives     Start     Dose/Rate Route Frequency Ordered Stop   12/12/12 1700   ciprofloxacin (CIPRO) IVPB 400 mg        400 mg 200 mL/hr over 60 Minutes Intravenous Every 12 hours 12/12/12 1603 12/12/12 1814   12/12/12 1700   metroNIDAZOLE (FLAGYL) IVPB 500 mg        500 mg 100 mL/hr over 60 Minutes Intravenous Every 8 hours 12/12/12 1603 12/13/12 0123   12/12/12 0519   metroNIDAZOLE (FLAGYL) IVPB 500 mg        500 mg 100 mL/hr over 60 Minutes Intravenous On call to O.R. 12/12/12 0519 12/12/12 0801   12/12/12 0519   ciprofloxacin (CIPRO) IVPB 400 mg        400 mg 200 mL/hr over 60 Minutes Intravenous On call to O.R. 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 Continue foley due to strict I&O, patient in ICU and  urinary output monitoring NPO/NGT while await return of bowel function.   Hypovolemia with oliguria and hypotension- replete with 1 L bolus LR. Recheck labs in PM Increase PCA settings. Hold on toradol due to significant oozing intraoperatively Continue OnQ Out of bed, incentive spirometery Lovenox for VTE prophylaxis. Add protonix IV for GI prophylaxis.   Hypomagnesemia - replete Hyponatremia /relative hypochloremia -change IVF to NS and increase to 125/hr.      LOS: 1 day    [facility] 12/13/2012

## 2012-12-13 NOTE — Progress Notes (Signed)
Hypomagnesemia   Mg replaced  

## 2012-12-13 NOTE — Progress Notes (Signed)
[id number]/Omir Cooprider, RN, BSN, CCM: CHART REVIEWED AND UPDATED.  Next chart review due on [id number]. NO DISCHARGE NEEDS PRESENT AT THIS TIME. CASE MANAGEMENT [phone] 

## 2012-12-14 LAB — COMPREHENSIVE METABOLIC PANEL
AST: 34 U/L (ref 0–37)
Albumin: 2.9 g/dL — ABNORMAL LOW (ref 3.5–5.2)
Alkaline Phosphatase: 39 U/L (ref 39–117)
CO2: 28 mEq/L (ref 19–32)
Chloride: 98 mEq/L (ref 96–112)
Creatinine, Ser: 0.7 mg/dL (ref 0.50–1.35)
GFR calc non Af Amer: >90 mL/min (ref >90)
Potassium: 4.2 mEq/L (ref 3.5–5.1)
Total Bilirubin: 1.1 mg/dL (ref 0.3–1.2)

## 2012-12-14 LAB — CBC
HCT: 33.1 % — ABNORMAL LOW (ref 39.0–52.0)
MCV: 87.8 fL (ref 78.0–100.0)
Platelets: 198 10*3/uL (ref 150–400)
RBC: 3.77 MIL/uL — ABNORMAL LOW (ref 4.22–5.81)
RDW: 14.4 % (ref 11.5–15.5)
WBC: 10.7 10*3/uL — ABNORMAL HIGH (ref 4.0–10.5)

## 2012-12-14 LAB — GLUCOSE, CAPILLARY
Glucose-Capillary: 151 mg/dL — ABNORMAL HIGH (ref 70–99)
Glucose-Capillary: 149 mg/dL — ABNORMAL HIGH (ref 70–99)
Glucose-Capillary: 116 mg/dL — ABNORMAL HIGH (ref 70–99)

## 2012-12-14 MED ORDER — PHENOL 1.4 % MT LIQD
1.0000 | OROMUCOSAL | Status: DC | PRN
  Administered 2012-12-15: 1 via OROMUCOSAL
  Filled 2012-12-14: qty 177

## 2012-12-14 MED ORDER — LORAZEPAM 2 MG/ML IJ SOLN
0.2500 mg | Freq: Two times a day (BID) | INTRAMUSCULAR | Status: DC | PRN
  Administered 2012-12-14 – 2012-12-15 (×2): 0.25 mg via INTRAVENOUS
  Filled 2012-12-14 (×2): qty 1

## 2012-12-14 MED ORDER — SODIUM CHLORIDE 0.9 % IV SOLN
Freq: Once | INTRAVENOUS | Status: AC
  Administered 2012-12-14: 10:00:00 via INTRAVENOUS

## 2012-12-14 MED ORDER — INSULIN GLARGINE 100 UNIT/ML ~~LOC~~ SOLN
10.0000 [IU] | Freq: Every day | SUBCUTANEOUS | Status: DC
  Administered 2012-12-14 – 2012-12-22 (×9): 10 [IU] via SUBCUTANEOUS

## 2012-12-14 NOTE — Progress Notes (Signed)
Patient ID: Hector Pugh, male   DOB: [birth year], 52 y.o.   MRN: [id number] [facility] Surgery Progress Note:   2 Days Post-Op  Subjective: Mental status is clear.  Alert and conversant Objective: Vital signs in last 24 hours: Temp:  [98.2 F (36.8 C)-99.6 F (37.6 C)] 99 F (37.2 C) (01/11 0400) Pulse Rate:  [81-116] 106  (01/11 0800) Resp:  [9-26] 22  (01/11 0800) BP: (89-143)/(67-88) 133/80 mmHg (01/11 0800) SpO2:  [98 %-100 %] 100 % (01/11 0800) Arterial Line BP: (95-141)/(65-106) 128/81 mmHg (01/11 0800)  Intake/Output from previous day: 01/10 0701 - 01/11 0700 In: 2458.4 [I.V.:2238.4; NG/GT:20; IV Piggyback:200] Out: 3680 [Urine:3475; Emesis/NG output:150; Drains:55] Intake/Output this shift: Total I/O In: 100 [I.V.:100] Out: 275 [Urine:225; Drains:50]  Physical Exam: Work of breathing is not elevated.  Drains are serosanguinous.  Dressing intact and dry.  Abdomen is nondistended  Lab Results:  Results for orders placed during the hospital encounter of 12/12/12 (from the past 48 hour(s))  GLUCOSE, CAPILLARY     Status: Abnormal   Collection Time   12/12/12  9:21 AM      Component Value Range Comment   Glucose-Capillary 170 (*) 70 - 99 mg/dL   POCT I-STAT 4, (NA,K, GLUC, HGB,HCT)     Status: Abnormal   Collection Time   12/12/12 12:15 PM      Component Value Range Comment   Sodium 134 (*) 135 - 145 mEq/L    Potassium 3.8  3.5 - 5.1 mEq/L    Glucose, Bld 168 (*) 70 - 99 mg/dL    HCT 14.7 (*) 82.9 - 52.0 %    Hemoglobin 11.6 (*) 13.0 - 17.0 g/dL   GLUCOSE, CAPILLARY     Status: Abnormal   Collection Time   12/12/12  2:18 PM      Component Value Range Comment   Glucose-Capillary 161 (*) 70 - 99 mg/dL    Comment 1 Documented in Chart      Comment 2 Notify RN     HEMOGLOBIN A1C     Status: Abnormal   Collection Time   12/12/12  4:15 PM      Component Value Range Comment   Hemoglobin A1C 6.4 (*) <5.7 %    Mean Plasma Glucose 137 (*) <117 mg/dL   CBC     Status:  Abnormal   Collection Time   12/12/12  4:15 PM      Component Value Range Comment   WBC 17.3 (*) 4.0 - 10.5 K/uL    RBC 4.49  4.22 - 5.81 MIL/uL    Hemoglobin 13.5  13.0 - 17.0 g/dL    HCT 56.2 (*) 13.0 - 52.0 %    MCV 85.1  78.0 - 100.0 fL    MCH 30.1  26.0 - 34.0 pg    MCHC 35.3  30.0 - 36.0 g/dL    RDW 86.5  78.4 - 69.6 %    Platelets 221  150 - 400 K/uL   CREATININE, SERUM     Status: Normal   Collection Time   12/12/12  4:15 PM      Component Value Range Comment   Creatinine, Ser 0.57  0.50 - 1.35 mg/dL    GFR calc non Af Amer >90  >90 mL/min    GFR calc Af Amer >90  >90 mL/min   GLUCOSE, CAPILLARY     Status: Abnormal   Collection Time   12/12/12  4:37 PM      Component Value  Range Comment   Glucose-Capillary 199 (*) 70 - 99 mg/dL   GLUCOSE, CAPILLARY     Status: Abnormal   Collection Time   12/12/12  7:34 PM      Component Value Range Comment   Glucose-Capillary 162 (*) 70 - 99 mg/dL   GLUCOSE, CAPILLARY     Status: Normal   Collection Time   12/12/12 11:58 PM      Component Value Range Comment   Glucose-Capillary 96  70 - 99 mg/dL   CBC     Status: Abnormal   Collection Time   12/13/12  4:15 AM      Component Value Range Comment   WBC 12.1 (*) 4.0 - 10.5 K/uL    RBC 3.91 (*) 4.22 - 5.81 MIL/uL    Hemoglobin 11.7 (*) 13.0 - 17.0 g/dL    HCT 16.1 (*) 09.6 - 52.0 %    MCV 85.9  78.0 - 100.0 fL    MCH 29.9  26.0 - 34.0 pg    MCHC 34.8  30.0 - 36.0 g/dL    RDW 04.5  40.9 - 81.1 %    Platelets 225  150 - 400 K/uL   COMPREHENSIVE METABOLIC PANEL     Status: Abnormal   Collection Time   12/13/12  4:15 AM      Component Value Range Comment   Sodium 130 (*) 135 - 145 mEq/L    Potassium 4.4  3.5 - 5.1 mEq/L    Chloride 96  96 - 112 mEq/L    CO2 25  19 - 32 mEq/L    Glucose, Bld 150 (*) 70 - 99 mg/dL    BUN 13  6 - 23 mg/dL    Creatinine, Ser 9.14  0.50 - 1.35 mg/dL DELTA CHECK NOTED   Calcium 8.1 (*) 8.4 - 10.5 mg/dL    Total Protein 5.4 (*) 6.0 - 8.3 g/dL    Albumin 2.9  (*) 3.5 - 5.2 g/dL    AST 37  0 - 37 U/L    ALT 26  0 - 53 U/L    Alkaline Phosphatase 38 (*) 39 - 117 U/L    Total Bilirubin 1.2  0.3 - 1.2 mg/dL    GFR calc non Af Amer 78 (*) >90 mL/min    GFR calc Af Amer >90  >90 mL/min   MAGNESIUM     Status: Abnormal   Collection Time   12/13/12  4:15 AM      Component Value Range Comment   Magnesium 1.4 (*) 1.5 - 2.5 mg/dL   PHOSPHORUS     Status: Normal   Collection Time   12/13/12  4:15 AM      Component Value Range Comment   Phosphorus 4.6  2.3 - 4.6 mg/dL   PROTIME-INR     Status: Normal   Collection Time   12/13/12  4:15 AM      Component Value Range Comment   Prothrombin Time 15.2  11.6 - 15.2 seconds    INR 1.22  0.00 - 1.49   GLUCOSE, CAPILLARY     Status: Abnormal   Collection Time   12/13/12  4:18 AM      Component Value Range Comment   Glucose-Capillary 127 (*) 70 - 99 mg/dL    Comment 1 Documented in Chart      Comment 2 Notify RN     GLUCOSE, CAPILLARY     Status: Abnormal   Collection Time   12/13/12  7:44  AM      Component Value Range Comment   Glucose-Capillary 150 (*) 70 - 99 mg/dL   GLUCOSE, CAPILLARY     Status: Abnormal   Collection Time   12/13/12 11:32 AM      Component Value Range Comment   Glucose-Capillary 121 (*) 70 - 99 mg/dL    Comment 1 Notify RN     CBC     Status: Abnormal   Collection Time   12/13/12  2:53 PM      Component Value Range Comment   WBC 9.8  4.0 - 10.5 K/uL    RBC 4.02 (*) 4.22 - 5.81 MIL/uL    Hemoglobin 11.9 (*) 13.0 - 17.0 g/dL    HCT 16.1 (*) 09.6 - 52.0 %    MCV 87.3  78.0 - 100.0 fL    MCH 29.6  26.0 - 34.0 pg    MCHC 33.9  30.0 - 36.0 g/dL    RDW 04.5  40.9 - 81.1 %    Platelets 190  150 - 400 K/uL   BASIC METABOLIC PANEL     Status: Abnormal   Collection Time   12/13/12  2:53 PM      Component Value Range Comment   Sodium 133 (*) 135 - 145 mEq/L    Potassium 4.5  3.5 - 5.1 mEq/L    Chloride 97  96 - 112 mEq/L    CO2 30  19 - 32 mEq/L    Glucose, Bld 150 (*) 70 - 99  mg/dL    BUN 9  6 - 23 mg/dL    Creatinine, Ser 9.14  0.50 - 1.35 mg/dL    Calcium 8.2 (*) 8.4 - 10.5 mg/dL    GFR calc non Af Amer >90  >90 mL/min    GFR calc Af Amer >90  >90 mL/min   GLUCOSE, CAPILLARY     Status: Abnormal   Collection Time   12/13/12  3:46 PM      Component Value Range Comment   Glucose-Capillary 144 (*) 70 - 99 mg/dL   GLUCOSE, CAPILLARY     Status: Abnormal   Collection Time   12/13/12  8:02 PM      Component Value Range Comment   Glucose-Capillary 142 (*) 70 - 99 mg/dL   GLUCOSE, CAPILLARY     Status: Abnormal   Collection Time   12/13/12 11:54 PM      Component Value Range Comment   Glucose-Capillary 159 (*) 70 - 99 mg/dL    Comment 1 Documented in Chart      Comment 2 Notify RN     GLUCOSE, CAPILLARY     Status: Abnormal   Collection Time   12/14/12  4:33 AM      Component Value Range Comment   Glucose-Capillary 151 (*) 70 - 99 mg/dL    Comment 1 Documented in Chart      Comment 2 Notify RN     CBC     Status: Abnormal   Collection Time   12/14/12  5:15 AM      Component Value Range Comment   WBC 10.7 (*) 4.0 - 10.5 K/uL    RBC 3.77 (*) 4.22 - 5.81 MIL/uL    Hemoglobin 11.1 (*) 13.0 - 17.0 g/dL    HCT 78.2 (*) 95.6 - 52.0 %    MCV 87.8  78.0 - 100.0 fL    MCH 29.4  26.0 - 34.0 pg    MCHC 33.5  30.0 -  36.0 g/dL    RDW 16.1  09.6 - 04.5 %    Platelets 198  150 - 400 K/uL   COMPREHENSIVE METABOLIC PANEL     Status: Abnormal   Collection Time   12/14/12  5:15 AM      Component Value Range Comment   Sodium 133 (*) 135 - 145 mEq/L    Potassium 4.2  3.5 - 5.1 mEq/L    Chloride 98  96 - 112 mEq/L    CO2 28  19 - 32 mEq/L    Glucose, Bld 176 (*) 70 - 99 mg/dL    BUN 5 (*) 6 - 23 mg/dL    Creatinine, Ser 4.09  0.50 - 1.35 mg/dL    Calcium 8.3 (*) 8.4 - 10.5 mg/dL    Total Protein 5.4 (*) 6.0 - 8.3 g/dL    Albumin 2.9 (*) 3.5 - 5.2 g/dL    AST 34  0 - 37 U/L    ALT 21  0 - 53 U/L    Alkaline Phosphatase 39  39 - 117 U/L    Total Bilirubin 1.1  0.3  - 1.2 mg/dL    GFR calc non Af Amer >90  >90 mL/min    GFR calc Af Amer >90  >90 mL/min   MAGNESIUM     Status: Normal   Collection Time   12/14/12  5:15 AM      Component Value Range Comment   Magnesium 1.8  1.5 - 2.5 mg/dL   PHOSPHORUS     Status: Abnormal   Collection Time   12/14/12  5:15 AM      Component Value Range Comment   Phosphorus 1.7 (*) 2.3 - 4.6 mg/dL     Radiology/Results: Dg Abd Portable 1v  12/12/2012  *RADIOLOGY REPORT*  Clinical Data: Abnormal needle count.  PORTABLE ABDOMEN - 1 VIEW  Comparison: None.  Findings: There are too large intra abdominal drainage catheters and and NG tube in place.  Surgical skin staples are noted also. No definite unexpected metallic foreign bodies identified.  IMPRESSION: Negative for metallic foreign body (suture needle).   Original Report Authenticated By: Rudie Meyer, M.D.     Anti-infectives: Anti-infectives     Start     Dose/Rate Route Frequency Ordered Stop   12/12/12 1700   ciprofloxacin (CIPRO) IVPB 400 mg        400 mg 200 mL/hr over 60 Minutes Intravenous Every 12 hours 12/12/12 1603 12/12/12 1814   12/12/12 1700   metroNIDAZOLE (FLAGYL) IVPB 500 mg        500 mg 100 mL/hr over 60 Minutes Intravenous Every 8 hours 12/12/12 1603 12/13/12 0123   12/12/12 0519   metroNIDAZOLE (FLAGYL) IVPB 500 mg        500 mg 100 mL/hr over 60 Minutes Intravenous On call to O.R. 12/12/12 0519 12/12/12 0801   12/12/12 0519   ciprofloxacin (CIPRO) IVPB 400 mg        400 mg 200 mL/hr over 60 Minutes Intravenous On call to O.R. 12/12/12 0519 12/12/12 0729          Assessment/Plan: Problem List: Patient Active Problem List  Diagnosis  . Diabetes mellitus  . HTN (hypertension)  . Pancreatitis  . Abdominal pain  . Obstructive jaundice  . Biliary stricture  . 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  He takes a beta blocker  for  hypertension.   Discussed with daugher (ICU nurse) and family.  Will observe for now and get up into chair 2 Days Post-Op    LOS: 2 days   Matt B. Daphine Deutscher, MD, [facility] [phone] beeper [phone]  12/14/2012 8:30 AM

## 2012-12-14 NOTE — Progress Notes (Signed)
Patient 2 assist up to chair at 1000.  Patient tolerated fairly.  HR elevated 130-140s irregular sinus rhythm.  Patient sat up in chair 1.5hrs.  HR sustained 130-140s, SBP 90-110s, O2 90-92% on room air.  2 assist back to bed.  Patient tolerated well.  HR decreased to 99-110 regular sinus rhythm, SBP 110-125, O2 94% on room air.  Patient seems much more comfortable at this time.  Will continue to monitor.

## 2012-12-15 LAB — CBC
Hemoglobin: 10.9 g/dL — ABNORMAL LOW (ref 13.0–17.0)
MCH: 29.6 pg (ref 26.0–34.0)
MCHC: 33.3 g/dL (ref 30.0–36.0)
MCV: 88.9 fL (ref 78.0–100.0)
Platelets: 175 10*3/uL (ref 150–400)

## 2012-12-15 LAB — COMPREHENSIVE METABOLIC PANEL
ALT: 15 U/L (ref 0–53)
Calcium: 8.5 mg/dL (ref 8.4–10.5)
Creatinine, Ser: 0.69 mg/dL (ref 0.50–1.35)
GFR calc Af Amer: >90 mL/min (ref >90)
GFR calc non Af Amer: >90 mL/min (ref >90)
Glucose, Bld: 160 mg/dL — ABNORMAL HIGH (ref 70–99)
Sodium: 134 mEq/L — ABNORMAL LOW (ref 135–145)
Total Protein: 5.4 g/dL — ABNORMAL LOW (ref 6.0–8.3)

## 2012-12-15 LAB — GLUCOSE, CAPILLARY
Glucose-Capillary: 150 mg/dL — ABNORMAL HIGH (ref 70–99)
Glucose-Capillary: 140 mg/dL — ABNORMAL HIGH (ref 70–99)
Glucose-Capillary: 136 mg/dL — ABNORMAL HIGH (ref 70–99)
Glucose-Capillary: 143 mg/dL — ABNORMAL HIGH (ref 70–99)

## 2012-12-15 LAB — PHOSPHORUS: Phosphorus: 2.2 mg/dL — ABNORMAL LOW (ref 2.3–4.6)

## 2012-12-15 MED ORDER — METOPROLOL TARTRATE 1 MG/ML IV SOLN
2.5000 mg | Freq: Four times a day (QID) | INTRAVENOUS | Status: DC
  Administered 2012-12-15 – 2012-12-17 (×7): 2.5 mg via INTRAVENOUS
  Filled 2012-12-15 (×11): qty 5

## 2012-12-15 NOTE — Progress Notes (Signed)
Patient ID: Hector Pugh, male   DOB: 1961/11/17, 52 y.o.   MRN: [id number] [facility] Surgery Progress Note:   3 Days Post-Op  Subjective: Mental status is clear and patient seems brighter after a good sleep-perhaps the result of some Xanax.  Pulse down into the 90s Objective: Vital signs in last 24 hours: Temp:  [97.2 F (36.2 C)-99.4 F (37.4 C)] 98.5 F (36.9 C) (01/12 0400) Pulse Rate:  [37-133] 53  (01/12 0700) Resp:  [6-22] 8  (01/12 0700) BP: (104-163)/(58-91) 123/88 mmHg (01/12 0700) SpO2:  [91 %-100 %] 99 % (01/12 0700) Arterial Line BP: (92-139)/(67-98) 104/69 mmHg (01/12 0500)  Intake/Output from previous day: 01/11 0701 - 01/12 0700 In: 2940 [P.O.:40; I.V.:2900] Out: 2828 [Urine:2450; Emesis/NG output:250; Drains:128] Intake/Output this shift:    Physical Exam: Work of breathing appears normal.  Drains serosanguinous.  On Q leaking.  Pain controlled.  Foley in place-pt wants to leave it until he is able to get around better.   Lab Results:  Results for orders placed during the hospital encounter of 12/12/12 (from the past 48 hour(s))  GLUCOSE, CAPILLARY     Status: Abnormal   Collection Time   12/13/12 11:32 AM      Component Value Range Comment   Glucose-Capillary 121 (*) 70 - 99 mg/dL    Comment 1 Notify RN     CBC     Status: Abnormal   Collection Time   12/13/12  2:53 PM      Component Value Range Comment   WBC 9.8  4.0 - 10.5 K/uL    RBC 4.02 (*) 4.22 - 5.81 MIL/uL    Hemoglobin 11.9 (*) 13.0 - 17.0 g/dL    HCT 63.8 (*) 75.6 - 52.0 %    MCV 87.3  78.0 - 100.0 fL    MCH 29.6  26.0 - 34.0 pg    MCHC 33.9  30.0 - 36.0 g/dL    RDW 43.3  29.5 - 18.8 %    Platelets 190  150 - 400 K/uL   BASIC METABOLIC PANEL     Status: Abnormal   Collection Time   12/13/12  2:53 PM      Component Value Range Comment   Sodium 133 (*) 135 - 145 mEq/L    Potassium 4.5  3.5 - 5.1 mEq/L    Chloride 97  96 - 112 mEq/L    CO2 30  19 - 32 mEq/L    Glucose, Bld 150 (*) 70  - 99 mg/dL    BUN 9  6 - 23 mg/dL    Creatinine, Ser 4.16  0.50 - 1.35 mg/dL    Calcium 8.2 (*) 8.4 - 10.5 mg/dL    GFR calc non Af Amer >90  >90 mL/min    GFR calc Af Amer >90  >90 mL/min   GLUCOSE, CAPILLARY     Status: Abnormal   Collection Time   12/13/12  3:46 PM      Component Value Range Comment   Glucose-Capillary 144 (*) 70 - 99 mg/dL   GLUCOSE, CAPILLARY     Status: Abnormal   Collection Time   12/13/12  8:02 PM      Component Value Range Comment   Glucose-Capillary 142 (*) 70 - 99 mg/dL   GLUCOSE, CAPILLARY     Status: Abnormal   Collection Time   12/13/12 11:54 PM      Component Value Range Comment   Glucose-Capillary 159 (*) 70 - 99 mg/dL  Comment 1 Documented in Chart      Comment 2 Notify RN     GLUCOSE, CAPILLARY     Status: Abnormal   Collection Time   12/14/12  4:33 AM      Component Value Range Comment   Glucose-Capillary 151 (*) 70 - 99 mg/dL    Comment 1 Documented in Chart      Comment 2 Notify RN     CBC     Status: Abnormal   Collection Time   12/14/12  5:15 AM      Component Value Range Comment   WBC 10.7 (*) 4.0 - 10.5 K/uL    RBC 3.77 (*) 4.22 - 5.81 MIL/uL    Hemoglobin 11.1 (*) 13.0 - 17.0 g/dL    HCT 40.9 (*) 81.1 - 52.0 %    MCV 87.8  78.0 - 100.0 fL    MCH 29.4  26.0 - 34.0 pg    MCHC 33.5  30.0 - 36.0 g/dL    RDW 91.4  78.2 - 95.6 %    Platelets 198  150 - 400 K/uL   COMPREHENSIVE METABOLIC PANEL     Status: Abnormal   Collection Time   12/14/12  5:15 AM      Component Value Range Comment   Sodium 133 (*) 135 - 145 mEq/L    Potassium 4.2  3.5 - 5.1 mEq/L    Chloride 98  96 - 112 mEq/L    CO2 28  19 - 32 mEq/L    Glucose, Bld 176 (*) 70 - 99 mg/dL    BUN 5 (*) 6 - 23 mg/dL    Creatinine, Ser 2.13  0.50 - 1.35 mg/dL    Calcium 8.3 (*) 8.4 - 10.5 mg/dL    Total Protein 5.4 (*) 6.0 - 8.3 g/dL    Albumin 2.9 (*) 3.5 - 5.2 g/dL    AST 34  0 - 37 U/L    ALT 21  0 - 53 U/L    Alkaline Phosphatase 39  39 - 117 U/L    Total Bilirubin 1.1   0.3 - 1.2 mg/dL    GFR calc non Af Amer >90  >90 mL/min    GFR calc Af Amer >90  >90 mL/min   MAGNESIUM     Status: Normal   Collection Time   12/14/12  5:15 AM      Component Value Range Comment   Magnesium 1.8  1.5 - 2.5 mg/dL   PHOSPHORUS     Status: Abnormal   Collection Time   12/14/12  5:15 AM      Component Value Range Comment   Phosphorus 1.7 (*) 2.3 - 4.6 mg/dL   GLUCOSE, CAPILLARY     Status: Abnormal   Collection Time   12/14/12  8:02 AM      Component Value Range Comment   Glucose-Capillary 147 (*) 70 - 99 mg/dL    Comment 1 Documented in Chart      Comment 2 Notify RN     GLUCOSE, CAPILLARY     Status: Abnormal   Collection Time   12/14/12 12:15 PM      Component Value Range Comment   Glucose-Capillary 180 (*) 70 - 99 mg/dL    Comment 1 Documented in Chart      Comment 2 Notify RN     GLUCOSE, CAPILLARY     Status: Abnormal   Collection Time   12/14/12  3:35 PM  Component Value Range Comment   Glucose-Capillary 149 (*) 70 - 99 mg/dL   GLUCOSE, CAPILLARY     Status: Abnormal   Collection Time   12/14/12  8:15 PM      Component Value Range Comment   Glucose-Capillary 116 (*) 70 - 99 mg/dL    Comment 1 Documented in Chart      Comment 2 Notify RN     GLUCOSE, CAPILLARY     Status: Abnormal   Collection Time   12/15/12 12:19 AM      Component Value Range Comment   Glucose-Capillary 138 (*) 70 - 99 mg/dL    Comment 1 Documented in Chart      Comment 2 Notify RN     GLUCOSE, CAPILLARY     Status: Abnormal   Collection Time   12/15/12  4:10 AM      Component Value Range Comment   Glucose-Capillary 144 (*) 70 - 99 mg/dL    Comment 1 Documented in Chart      Comment 2 Notify RN     CBC     Status: Abnormal   Collection Time   12/15/12  6:23 AM      Component Value Range Comment   WBC 9.8  4.0 - 10.5 K/uL    RBC 3.68 (*) 4.22 - 5.81 MIL/uL    Hemoglobin 10.9 (*) 13.0 - 17.0 g/dL    HCT 16.1 (*) 09.6 - 52.0 %    MCV 88.9  78.0 - 100.0 fL    MCH 29.6  26.0 -  34.0 pg    MCHC 33.3  30.0 - 36.0 g/dL    RDW 04.5  40.9 - 81.1 %    Platelets 175  150 - 400 K/uL   COMPREHENSIVE METABOLIC PANEL     Status: Abnormal   Collection Time   12/15/12  6:23 AM      Component Value Range Comment   Sodium 134 (*) 135 - 145 mEq/L    Potassium 4.0  3.5 - 5.1 mEq/L    Chloride 97  96 - 112 mEq/L    CO2 31  19 - 32 mEq/L    Glucose, Bld 160 (*) 70 - 99 mg/dL    BUN <3 (*) 6 - 23 mg/dL    Creatinine, Ser 9.14  0.50 - 1.35 mg/dL    Calcium 8.5  8.4 - 78.2 mg/dL    Total Protein 5.4 (*) 6.0 - 8.3 g/dL    Albumin 2.6 (*) 3.5 - 5.2 g/dL    AST 23  0 - 37 U/L    ALT 15  0 - 53 U/L    Alkaline Phosphatase 33 (*) 39 - 117 U/L    Total Bilirubin 0.9  0.3 - 1.2 mg/dL    GFR calc non Af Amer >90  >90 mL/min    GFR calc Af Amer >90  >90 mL/min   MAGNESIUM     Status: Normal   Collection Time   12/15/12  6:23 AM      Component Value Range Comment   Magnesium 1.6  1.5 - 2.5 mg/dL   PHOSPHORUS     Status: Abnormal   Collection Time   12/15/12  6:23 AM      Component Value Range Comment   Phosphorus 2.2 (*) 2.3 - 4.6 mg/dL   GLUCOSE, CAPILLARY     Status: Abnormal   Collection Time   12/15/12  7:26 AM      Component Value Range  Comment   Glucose-Capillary 150 (*) 70 - 99 mg/dL    Comment 1 Documented in Chart      Comment 2 Notify RN       Radiology/Results: No results found.  Anti-infectives: Anti-infectives     Start     Dose/Rate Route Frequency Ordered Stop   12/12/12 1700   ciprofloxacin (CIPRO) IVPB 400 mg        400 mg 200 mL/hr over 60 Minutes Intravenous Every 12 hours 12/12/12 1603 12/12/12 1814   12/12/12 1700   metroNIDAZOLE (FLAGYL) IVPB 500 mg        500 mg 100 mL/hr over 60 Minutes Intravenous Every 8 hours 12/12/12 1603 12/13/12 0123   12/12/12 0519   metroNIDAZOLE (FLAGYL) IVPB 500 mg        500 mg 100 mL/hr over 60 Minutes Intravenous On call to O.R. 12/12/12 0519 12/12/12 0801   12/12/12 0519   ciprofloxacin (CIPRO) IVPB 400 mg         400 mg 200 mL/hr over 60 Minutes Intravenous On call to O.R. 12/12/12 0519 12/12/12 0729          Assessment/Plan: Problem List: Patient Active Problem List  Diagnosis  . Diabetes mellitus  . HTN (hypertension)  . Pancreatitis  . Abdominal pain  . Obstructive jaundice  . Biliary stricture  . 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  Responded to Xanax and rest.  3 Days Post-Op    LOS: 3 days   Matt B. Daphine Deutscher, MD, [facility] [phone] beeper [phone]  12/15/2012 7:50 AM

## 2012-12-16 DIAGNOSIS — E119 Type 2 diabetes mellitus without complications: Secondary | ICD-10-CM

## 2012-12-16 DIAGNOSIS — I10 Essential (primary) hypertension: Secondary | ICD-10-CM

## 2012-12-16 LAB — COMPREHENSIVE METABOLIC PANEL
AST: 19 U/L (ref 0–37)
Albumin: 2.6 g/dL — ABNORMAL LOW (ref 3.5–5.2)
BUN: <3 mg/dL — ABNORMAL LOW (ref 6–23)
Calcium: 8.6 mg/dL (ref 8.4–10.5)
Chloride: 97 mEq/L (ref 96–112)
Creatinine, Ser: 0.66 mg/dL (ref 0.50–1.35)
Total Bilirubin: 0.8 mg/dL (ref 0.3–1.2)
Total Protein: 5.4 g/dL — ABNORMAL LOW (ref 6.0–8.3)

## 2012-12-16 LAB — GLUCOSE, CAPILLARY
Glucose-Capillary: 105 mg/dL — ABNORMAL HIGH (ref 70–99)
Glucose-Capillary: 131 mg/dL — ABNORMAL HIGH (ref 70–99)

## 2012-12-16 LAB — TYPE AND SCREEN
ABO/RH(D): O POS
Antibody Screen: NEGATIVE
Unit division: 0
Unit division: 0

## 2012-12-16 LAB — CBC
HCT: 31.8 % — ABNORMAL LOW (ref 39.0–52.0)
MCH: 28.7 pg (ref 26.0–34.0)
MCHC: 32.7 g/dL (ref 30.0–36.0)
MCV: 87.8 fL (ref 78.0–100.0)
Platelets: 180 10*3/uL (ref 150–400)
RDW: 14 % (ref 11.5–15.5)
WBC: 6.9 10*3/uL (ref 4.0–10.5)

## 2012-12-16 MED ORDER — MORPHINE SULFATE (PF) 1 MG/ML IV SOLN
INTRAVENOUS | Status: DC
  Administered 2012-12-17: 9 mg via INTRAVENOUS
  Administered 2012-12-17: 14 mg via INTRAVENOUS
  Administered 2012-12-17: 25.88 mg via INTRAVENOUS
  Administered 2012-12-17 (×3): via INTRAVENOUS
  Administered 2012-12-17: 9 mg via INTRAVENOUS
  Administered 2012-12-18: 4 mg via INTRAVENOUS
  Administered 2012-12-18: 10:00:00 via INTRAVENOUS
  Administered 2012-12-18: 5 mg via INTRAVENOUS
  Administered 2012-12-18: 17 mg via INTRAVENOUS
  Administered 2012-12-18: 5 mg via INTRAVENOUS
  Filled 2012-12-16 (×4): qty 25

## 2012-12-16 NOTE — Progress Notes (Signed)
[id number]/Aadhav Uhlig, RN, BSN, CCM: CHART REVIEWED AND UPDATED.  Next chart review due on 0162014. NO DISCHARGE NEEDS PRESENT AT THIS TIME. CASE MANAGEMENT [phone] 

## 2012-12-16 NOTE — Progress Notes (Signed)
Patient ID: Hector Pugh, male   DOB: [birth year], 52 y.o.   MRN: [id number] [facility] Surgery Progress Note:   4 Days Post-Op  Subjective: Pt still sore, but no complaints.  No n/v.  No flatus   Objective: Vital signs in last 24 hours: Temp:  [97.4 F (36.3 C)-98.6 F (37 C)] 97.4 F (36.3 C) (01/13 0400) Pulse Rate:  [81-102] 84  (01/13 0800) Resp:  [5-19] 11  (01/13 0837) BP: (101-142)/(71-90) 105/75 mmHg (01/13 0800) SpO2:  [97 %-100 %] 100 % (01/13 0837)  Intake/Output from previous day: 01/12 0701 - 01/13 0700 In: 2340 [P.O.:40; I.V.:2300] Out: 2280 [Urine:1575; Emesis/NG output:600; Drains:105] Intake/Output this shift: Total I/O In: 100 [I.V.:100] Out: 35 [Urine:35]  Physical Exam:   Alert and oriented Dressings c/d/i. Drains serosang abd soft, approp tender.  Sl distended.   Lab Results:  Results for orders placed during the hospital encounter of 12/12/12 (from the past 48 hour(s))  GLUCOSE, CAPILLARY     Status: Abnormal   Collection Time   12/14/12 12:15 PM      Component Value Range Comment   Glucose-Capillary 180 (*) 70 - 99 mg/dL    Comment 1 Documented in Chart      Comment 2 Notify RN     GLUCOSE, CAPILLARY     Status: Abnormal   Collection Time   12/14/12  3:35 PM      Component Value Range Comment   Glucose-Capillary 149 (*) 70 - 99 mg/dL   GLUCOSE, CAPILLARY     Status: Abnormal   Collection Time   12/14/12  8:15 PM      Component Value Range Comment   Glucose-Capillary 116 (*) 70 - 99 mg/dL    Comment 1 Documented in Chart      Comment 2 Notify RN     GLUCOSE, CAPILLARY     Status: Abnormal   Collection Time   12/15/12 12:19 AM      Component Value Range Comment   Glucose-Capillary 138 (*) 70 - 99 mg/dL    Comment 1 Documented in Chart      Comment 2 Notify RN     GLUCOSE, CAPILLARY     Status: Abnormal   Collection Time   12/15/12  4:10 AM      Component Value Range Comment   Glucose-Capillary 144 (*) 70 - 99 mg/dL    Comment 1  Documented in Chart      Comment 2 Notify RN     CBC     Status: Abnormal   Collection Time   12/15/12  6:23 AM      Component Value Range Comment   WBC 9.8  4.0 - 10.5 K/uL    RBC 3.68 (*) 4.22 - 5.81 MIL/uL    Hemoglobin 10.9 (*) 13.0 - 17.0 g/dL    HCT 40.9 (*) 81.1 - 52.0 %    MCV 88.9  78.0 - 100.0 fL    MCH 29.6  26.0 - 34.0 pg    MCHC 33.3  30.0 - 36.0 g/dL    RDW 91.4  78.2 - 95.6 %    Platelets 175  150 - 400 K/uL   COMPREHENSIVE METABOLIC PANEL     Status: Abnormal   Collection Time   12/15/12  6:23 AM      Component Value Range Comment   Sodium 134 (*) 135 - 145 mEq/L    Potassium 4.0  3.5 - 5.1 mEq/L    Chloride 97  96 -  112 mEq/L    CO2 31  19 - 32 mEq/L    Glucose, Bld 160 (*) 70 - 99 mg/dL    BUN <3 (*) 6 - 23 mg/dL    Creatinine, Ser 4.74  0.50 - 1.35 mg/dL    Calcium 8.5  8.4 - 25.9 mg/dL    Total Protein 5.4 (*) 6.0 - 8.3 g/dL    Albumin 2.6 (*) 3.5 - 5.2 g/dL    AST 23  0 - 37 U/L    ALT 15  0 - 53 U/L    Alkaline Phosphatase 33 (*) 39 - 117 U/L    Total Bilirubin 0.9  0.3 - 1.2 mg/dL    GFR calc non Af Amer >90  >90 mL/min    GFR calc Af Amer >90  >90 mL/min   MAGNESIUM     Status: Normal   Collection Time   12/15/12  6:23 AM      Component Value Range Comment   Magnesium 1.6  1.5 - 2.5 mg/dL   PHOSPHORUS     Status: Abnormal   Collection Time   12/15/12  6:23 AM      Component Value Range Comment   Phosphorus 2.2 (*) 2.3 - 4.6 mg/dL   GLUCOSE, CAPILLARY     Status: Abnormal   Collection Time   12/15/12  7:26 AM      Component Value Range Comment   Glucose-Capillary 150 (*) 70 - 99 mg/dL    Comment 1 Documented in Chart      Comment 2 Notify RN     GLUCOSE, CAPILLARY     Status: Abnormal   Collection Time   12/15/12 11:30 AM      Component Value Range Comment   Glucose-Capillary 140 (*) 70 - 99 mg/dL    Comment 1 Documented in Chart      Comment 2 Notify RN     GLUCOSE, CAPILLARY     Status: Abnormal   Collection Time   12/15/12  3:40 PM       Component Value Range Comment   Glucose-Capillary 136 (*) 70 - 99 mg/dL   GLUCOSE, CAPILLARY     Status: Abnormal   Collection Time   12/15/12  6:56 PM      Component Value Range Comment   Glucose-Capillary 143 (*) 70 - 99 mg/dL    Comment 1 Documented in Chart      Comment 2 Notify RN     GLUCOSE, CAPILLARY     Status: Abnormal   Collection Time   12/16/12 12:15 AM      Component Value Range Comment   Glucose-Capillary 131 (*) 70 - 99 mg/dL    Comment 1 Documented in Chart      Comment 2 Notify RN     CBC     Status: Abnormal   Collection Time   12/16/12  3:30 AM      Component Value Range Comment   WBC 6.9  4.0 - 10.5 K/uL    RBC 3.62 (*) 4.22 - 5.81 MIL/uL    Hemoglobin 10.4 (*) 13.0 - 17.0 g/dL    HCT 56.3 (*) 87.5 - 52.0 %    MCV 87.8  78.0 - 100.0 fL    MCH 28.7  26.0 - 34.0 pg    MCHC 32.7  30.0 - 36.0 g/dL    RDW 64.3  32.9 - 51.8 %    Platelets 180  150 - 400 K/uL   COMPREHENSIVE METABOLIC  PANEL     Status: Abnormal   Collection Time   12/16/12  3:30 AM      Component Value Range Comment   Sodium 134 (*) 135 - 145 mEq/L    Potassium 4.0  3.5 - 5.1 mEq/L    Chloride 97  96 - 112 mEq/L    CO2 32  19 - 32 mEq/L    Glucose, Bld 153 (*) 70 - 99 mg/dL    BUN <3 (*) 6 - 23 mg/dL CONSISTENT WITH PREVIOUS RESULT   Creatinine, Ser 0.66  0.50 - 1.35 mg/dL    Calcium 8.6  8.4 - 16.1 mg/dL    Total Protein 5.4 (*) 6.0 - 8.3 g/dL    Albumin 2.6 (*) 3.5 - 5.2 g/dL    AST 19  0 - 37 U/L    ALT 14  0 - 53 U/L    Alkaline Phosphatase 32 (*) 39 - 117 U/L    Total Bilirubin 0.8  0.3 - 1.2 mg/dL    GFR calc non Af Amer >90  >90 mL/min    GFR calc Af Amer >90  >90 mL/min   GLUCOSE, CAPILLARY     Status: Abnormal   Collection Time   12/16/12  3:56 AM      Component Value Range Comment   Glucose-Capillary 149 (*) 70 - 99 mg/dL    Comment 1 Documented in Chart      Comment 2 Notify RN     GLUCOSE, CAPILLARY     Status: Abnormal   Collection Time   12/16/12  7:32 AM      Component  Value Range Comment   Glucose-Capillary 130 (*) 70 - 99 mg/dL     Radiology/Results: No results found.  Anti-infectives: Anti-infectives     Start     Dose/Rate Route Frequency Ordered Stop   12/12/12 1700   ciprofloxacin (CIPRO) IVPB 400 mg        400 mg 200 mL/hr over 60 Minutes Intravenous Every 12 hours 12/12/12 1603 12/12/12 1814   12/12/12 1700   metroNIDAZOLE (FLAGYL) IVPB 500 mg        500 mg 100 mL/hr over 60 Minutes Intravenous Every 8 hours 12/12/12 1603 12/13/12 0123   12/12/12 0519   metroNIDAZOLE (FLAGYL) IVPB 500 mg        500 mg 100 mL/hr over 60 Minutes Intravenous On call to O.R. 12/12/12 0519 12/12/12 0801   12/12/12 0519   ciprofloxacin (CIPRO) IVPB 400 mg        400 mg 200 mL/hr over 60 Minutes Intravenous On call to O.R. 12/12/12 0519 12/12/12 0729          Assessment/Plan: Problem List: Patient Active Problem List  Diagnosis  . Diabetes mellitus  . HTN (hypertension)  . Pancreatitis  . Abdominal pain  . Obstructive jaundice  . Biliary stricture  . 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 Metoprolol for HTN and brief atrial fibrillation Morphine pca for pain control SSI for DM    LOS: 4 days   12/16/2012 9:02 AM

## 2012-12-17 LAB — GLUCOSE, CAPILLARY
Glucose-Capillary: 109 mg/dL — ABNORMAL HIGH (ref 70–99)
Glucose-Capillary: 116 mg/dL — ABNORMAL HIGH (ref 70–99)
Glucose-Capillary: 128 mg/dL — ABNORMAL HIGH (ref 70–99)
Glucose-Capillary: 156 mg/dL — ABNORMAL HIGH (ref 70–99)

## 2012-12-17 LAB — COMPREHENSIVE METABOLIC PANEL
AST: 19 U/L (ref 0–37)
Albumin: 2.9 g/dL — ABNORMAL LOW (ref 3.5–5.2)
Alkaline Phosphatase: 41 U/L (ref 39–117)
BUN: 3 mg/dL — ABNORMAL LOW (ref 6–23)
CO2: 30 mEq/L (ref 19–32)
Chloride: 96 mEq/L (ref 96–112)
Creatinine, Ser: 0.66 mg/dL (ref 0.50–1.35)
GFR calc non Af Amer: >90 mL/min (ref >90)
Potassium: 4 mEq/L (ref 3.5–5.1)
Total Bilirubin: 0.9 mg/dL (ref 0.3–1.2)

## 2012-12-17 LAB — CBC
HCT: 34 % — ABNORMAL LOW (ref 39.0–52.0)
MCV: 87 fL (ref 78.0–100.0)
RBC: 3.91 MIL/uL — ABNORMAL LOW (ref 4.22–5.81)
RDW: 13.9 % (ref 11.5–15.5)
WBC: 7.3 10*3/uL (ref 4.0–10.5)

## 2012-12-17 MED ORDER — METOPROLOL TARTRATE 1 MG/ML IV SOLN
5.0000 mg | Freq: Four times a day (QID) | INTRAVENOUS | Status: DC
  Administered 2012-12-17 – 2012-12-19 (×10): 5 mg via INTRAVENOUS
  Filled 2012-12-17 (×13): qty 5

## 2012-12-17 MED ORDER — KETOROLAC TROMETHAMINE 30 MG/ML IJ SOLN
30.0000 mg | Freq: Three times a day (TID) | INTRAMUSCULAR | Status: AC
  Administered 2012-12-17 – 2012-12-18 (×6): 30 mg via INTRAVENOUS
  Filled 2012-12-17 (×8): qty 1

## 2012-12-17 MED ORDER — ACETAMINOPHEN 160 MG/5ML PO SOLN
650.0000 mg | Freq: Four times a day (QID) | ORAL | Status: DC
  Administered 2012-12-17 – 2012-12-18 (×5): 650 mg via ORAL
  Filled 2012-12-17 (×6): qty 20.3

## 2012-12-17 NOTE — Progress Notes (Signed)
Patient ID: Hector Pugh, male   DOB: 1961-06-15, 52 y.o.   MRN: [id number]  [facility] Surgery Progress Note:     Subjective: Doing better.  Voided spontaneously after foley removal.  No flatus   Objective: Vital signs in last 24 hours: Temp:  [97.6 F (36.4 C)-98.7 F (37.1 C)] 98.5 F (36.9 C) (01/14 0800) Pulse Rate:  [81-111] 81  (01/14 0747) Resp:  [3-16] 12  (01/14 0747) BP: (128-151)/(70-93) 151/77 mmHg (01/14 0747) SpO2:  [96 %-100 %] 100 % (01/14 0747) Weight:  [141 lb 12.1 oz (64.3 kg)] 141 lb 12.1 oz (64.3 kg) (01/14 0400)  Intake/Output from previous day: 01/13 0701 - 01/14 0700 In: 1875 [P.O.:150; I.V.:1725] Out: 1812 [Urine:1685; Emesis/NG output:50; Drains:77] Intake/Output this shift: Total I/O In: -  Out: 400 [Urine:400]  Physical Exam:   Alert and oriented Dressings c/d/i. Drains serosang abd soft, approp tender.  Sl distended.   Lab Results:  Results for orders placed during the hospital encounter of 12/12/12 (from the past 48 hour(s))  GLUCOSE, CAPILLARY     Status: Abnormal   Collection Time   12/15/12 11:30 AM      Component Value Range Comment   Glucose-Capillary 140 (*) 70 - 99 mg/dL    Comment 1 Documented in Chart      Comment 2 Notify RN     GLUCOSE, CAPILLARY     Status: Abnormal   Collection Time   12/15/12  3:40 PM      Component Value Range Comment   Glucose-Capillary 136 (*) 70 - 99 mg/dL   GLUCOSE, CAPILLARY     Status: Abnormal   Collection Time   12/15/12  6:56 PM      Component Value Range Comment   Glucose-Capillary 143 (*) 70 - 99 mg/dL    Comment 1 Documented in Chart      Comment 2 Notify RN     GLUCOSE, CAPILLARY     Status: Abnormal   Collection Time   12/16/12 12:15 AM      Component Value Range Comment   Glucose-Capillary 131 (*) 70 - 99 mg/dL    Comment 1 Documented in Chart      Comment 2 Notify RN     CBC     Status: Abnormal   Collection Time   12/16/12  3:30 AM      Component Value Range Comment   WBC 6.9  4.0 - 10.5 K/uL    RBC 3.62 (*) 4.22 - 5.81 MIL/uL    Hemoglobin 10.4 (*) 13.0 - 17.0 g/dL    HCT 40.9 (*) 81.1 - 52.0 %    MCV 87.8  78.0 - 100.0 fL    MCH 28.7  26.0 - 34.0 pg    MCHC 32.7  30.0 - 36.0 g/dL    RDW 91.4  78.2 - 95.6 %    Platelets 180  150 - 400 K/uL   COMPREHENSIVE METABOLIC PANEL     Status: Abnormal   Collection Time   12/16/12  3:30 AM      Component Value Range Comment   Sodium 134 (*) 135 - 145 mEq/L    Potassium 4.0  3.5 - 5.1 mEq/L    Chloride 97  96 - 112 mEq/L    CO2 32  19 - 32 mEq/L    Glucose, Bld 153 (*) 70 - 99 mg/dL    BUN <3 (*) 6 - 23 mg/dL CONSISTENT WITH PREVIOUS RESULT   Creatinine, Ser 0.66  0.50 - 1.35 mg/dL    Calcium 8.6  8.4 - 78.2 mg/dL    Total Protein 5.4 (*) 6.0 - 8.3 g/dL    Albumin 2.6 (*) 3.5 - 5.2 g/dL    AST 19  0 - 37 U/L    ALT 14  0 - 53 U/L    Alkaline Phosphatase 32 (*) 39 - 117 U/L    Total Bilirubin 0.8  0.3 - 1.2 mg/dL    GFR calc non Af Amer >90  >90 mL/min    GFR calc Af Amer >90  >90 mL/min   GLUCOSE, CAPILLARY     Status: Abnormal   Collection Time   12/16/12  3:56 AM      Component Value Range Comment   Glucose-Capillary 149 (*) 70 - 99 mg/dL    Comment 1 Documented in Chart      Comment 2 Notify RN     GLUCOSE, CAPILLARY     Status: Abnormal   Collection Time   12/16/12  7:32 AM      Component Value Range Comment   Glucose-Capillary 130 (*) 70 - 99 mg/dL   GLUCOSE, CAPILLARY     Status: Abnormal   Collection Time   12/16/12 11:22 AM      Component Value Range Comment   Glucose-Capillary 124 (*) 70 - 99 mg/dL    Comment 1 Documented in Chart      Comment 2 Notify RN     GLUCOSE, CAPILLARY     Status: Abnormal   Collection Time   12/16/12  5:43 PM      Component Value Range Comment   Glucose-Capillary 101 (*) 70 - 99 mg/dL   GLUCOSE, CAPILLARY     Status: Abnormal   Collection Time   12/16/12  6:31 PM      Component Value Range Comment   Glucose-Capillary 105 (*) 70 - 99 mg/dL   GLUCOSE,  CAPILLARY     Status: Abnormal   Collection Time   12/16/12  8:46 PM      Component Value Range Comment   Glucose-Capillary 130 (*) 70 - 99 mg/dL   GLUCOSE, CAPILLARY     Status: Abnormal   Collection Time   12/16/12 11:42 PM      Component Value Range Comment   Glucose-Capillary 111 (*) 70 - 99 mg/dL    Comment 1 Notify RN     GLUCOSE, CAPILLARY     Status: Abnormal   Collection Time   12/17/12 12:17 AM      Component Value Range Comment   Glucose-Capillary 109 (*) 70 - 99 mg/dL   CBC     Status: Abnormal   Collection Time   12/17/12  3:43 AM      Component Value Range Comment   WBC 7.3  4.0 - 10.5 K/uL    RBC 3.91 (*) 4.22 - 5.81 MIL/uL    Hemoglobin 11.7 (*) 13.0 - 17.0 g/dL    HCT 95.6 (*) 21.3 - 52.0 %    MCV 87.0  78.0 - 100.0 fL    MCH 29.9  26.0 - 34.0 pg    MCHC 34.4  30.0 - 36.0 g/dL    RDW 08.6  57.8 - 46.9 %    Platelets 226  150 - 400 K/uL   COMPREHENSIVE METABOLIC PANEL     Status: Abnormal   Collection Time   12/17/12  3:43 AM      Component Value Range Comment   Sodium 134 (*)  135 - 145 mEq/L    Potassium 4.0  3.5 - 5.1 mEq/L    Chloride 96  96 - 112 mEq/L    CO2 30  19 - 32 mEq/L    Glucose, Bld 131 (*) 70 - 99 mg/dL    BUN 3 (*) 6 - 23 mg/dL    Creatinine, Ser 8.65  0.50 - 1.35 mg/dL    Calcium 9.1  8.4 - 78.4 mg/dL    Total Protein 6.1  6.0 - 8.3 g/dL    Albumin 2.9 (*) 3.5 - 5.2 g/dL    AST 19  0 - 37 U/L    ALT 14  0 - 53 U/L    Alkaline Phosphatase 41  39 - 117 U/L    Total Bilirubin 0.9  0.3 - 1.2 mg/dL    GFR calc non Af Amer >90  >90 mL/min    GFR calc Af Amer >90  >90 mL/min   GLUCOSE, CAPILLARY     Status: Abnormal   Collection Time   12/17/12  4:13 AM      Component Value Range Comment   Glucose-Capillary 143 (*) 70 - 99 mg/dL   GLUCOSE, CAPILLARY     Status: Abnormal   Collection Time   12/17/12  7:30 AM      Component Value Range Comment   Glucose-Capillary 116 (*) 70 - 99 mg/dL     Radiology/Results: No results  found.  Anti-infectives: Anti-infectives     Start     Dose/Rate Route Frequency Ordered Stop   12/12/12 1700   ciprofloxacin (CIPRO) IVPB 400 mg        400 mg 200 mL/hr over 60 Minutes Intravenous Every 12 hours 12/12/12 1603 12/12/12 1814   12/12/12 1700   metroNIDAZOLE (FLAGYL) IVPB 500 mg        500 mg 100 mL/hr over 60 Minutes Intravenous Every 8 hours 12/12/12 1603 12/13/12 0123   12/12/12 0519   metroNIDAZOLE (FLAGYL) IVPB 500 mg        500 mg 100 mL/hr over 60 Minutes Intravenous On call to O.R. 12/12/12 0519 12/12/12 0801   12/12/12 0519   ciprofloxacin (CIPRO) IVPB 400 mg        400 mg 200 mL/hr over 60 Minutes Intravenous On call to O.R. 12/12/12 0519 12/12/12 0729          Assessment/Plan: Problem List: Patient Active Problem List  Diagnosis  . Diabetes mellitus  . HTN (hypertension)  . Pancreatitis  . Abdominal pain  . Obstructive jaundice  . Biliary stricture  . 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  Add standing tylenol and toradol SSI for DM Given continued bouts of tachycardia, would leave in stepdown.   Add flutter valve    LOS: 5 days   12/17/2012 8:02 AM

## 2012-12-18 LAB — CBC
HCT: 29.9 % — ABNORMAL LOW (ref 39.0–52.0)
Hemoglobin: 10.2 g/dL — ABNORMAL LOW (ref 13.0–17.0)
MCV: 86.2 fL (ref 78.0–100.0)
WBC: 4.8 10*3/uL (ref 4.0–10.5)

## 2012-12-18 LAB — BASIC METABOLIC PANEL
BUN: 5 mg/dL — ABNORMAL LOW (ref 6–23)
CO2: 32 mEq/L (ref 19–32)
Chloride: 101 mEq/L (ref 96–112)
GFR calc Af Amer: >90 mL/min (ref >90)
Potassium: 3.5 mEq/L (ref 3.5–5.1)

## 2012-12-18 LAB — GLUCOSE, CAPILLARY
Glucose-Capillary: 109 mg/dL — ABNORMAL HIGH (ref 70–99)
Glucose-Capillary: 114 mg/dL — ABNORMAL HIGH (ref 70–99)
Glucose-Capillary: 124 mg/dL — ABNORMAL HIGH (ref 70–99)

## 2012-12-18 MED ORDER — HYDROCODONE-ACETAMINOPHEN 5-325 MG PO TABS: 1.0000 | ORAL_TABLET | ORAL | Status: DC | PRN

## 2012-12-18 MED ORDER — MORPHINE SULFATE 2 MG/ML IJ SOLN
1.0000 mg | INTRAMUSCULAR | Status: DC | PRN
Start: 2012-12-18 — End: 2012-12-22
  Administered 2012-12-19 – 2012-12-20 (×2): 2 mg via INTRAVENOUS
  Filled 2012-12-18 (×2): qty 1

## 2012-12-18 MED ORDER — OXYCODONE-ACETAMINOPHEN 5-325 MG/5ML PO SOLN: 10.0000 mL | ORAL | Status: DC | PRN

## 2012-12-18 MED ORDER — OXYCODONE-ACETAMINOPHEN 5-325 MG/5ML PO SOLN
5.0000 mL | Freq: Three times a day (TID) | ORAL | Status: DC
  Administered 2012-12-18: 5 mL via ORAL
  Filled 2012-12-18: qty 5

## 2012-12-18 MED ORDER — HYDROCODONE-ACETAMINOPHEN 5-325 MG PO TABS
1.0000 | ORAL_TABLET | Freq: Four times a day (QID) | ORAL | Status: DC
  Administered 2012-12-18 – 2012-12-20 (×7): 1 via ORAL
  Filled 2012-12-18 (×7): qty 1

## 2012-12-18 NOTE — Progress Notes (Signed)
Patient ID: Hector Pugh, male   DOB: February 28, 1961, 52 y.o.   MRN: [id number]  [facility] Surgery Progress Note:     Subjective: + flatus.  Tolerated clear liq.  Still fairly sore  Objective: Vital signs in last 24 hours: Temp:  [96.6 F (35.9 C)-98.3 F (36.8 C)] 97.6 F (36.4 C) (01/15 0800) Pulse Rate:  [66-93] 75  (01/15 0800) Resp:  [9-16] 13  (01/15 0800) BP: (120-162)/(72-80) 127/80 mmHg (01/15 0800) SpO2:  [98 %-100 %] 98 % (01/15 0800)  Intake/Output from previous day: 01/14 0701 - 01/15 0700 In: 2028.5 [P.O.:160; I.V.:1820.5] Out: 965 [Urine:925; Drains:40] Intake/Output this shift: Total I/O In: 325 [P.O.:100; I.V.:225] Out: 72 [Drains:72]  Physical Exam:   Alert and oriented Wounds clean no drainage Drains serosang abd soft, approp tender.  Non distended  Lab Results:  Results for orders placed during the hospital encounter of 12/12/12 (from the past 48 hour(s))  GLUCOSE, CAPILLARY     Status: Abnormal   Collection Time   12/16/12 11:22 AM      Component Value Range Comment   Glucose-Capillary 124 (*) 70 - 99 mg/dL    Comment 1 Documented in Chart      Comment 2 Notify RN     GLUCOSE, CAPILLARY     Status: Abnormal   Collection Time   12/16/12  5:43 PM      Component Value Range Comment   Glucose-Capillary 101 (*) 70 - 99 mg/dL   GLUCOSE, CAPILLARY     Status: Abnormal   Collection Time   12/16/12  6:31 PM      Component Value Range Comment   Glucose-Capillary 105 (*) 70 - 99 mg/dL   GLUCOSE, CAPILLARY     Status: Abnormal   Collection Time   12/16/12  8:46 PM      Component Value Range Comment   Glucose-Capillary 130 (*) 70 - 99 mg/dL   GLUCOSE, CAPILLARY     Status: Abnormal   Collection Time   12/16/12 11:42 PM      Component Value Range Comment   Glucose-Capillary 111 (*) 70 - 99 mg/dL    Comment 1 Notify RN     GLUCOSE, CAPILLARY     Status: Abnormal   Collection Time   12/17/12 12:17 AM      Component Value Range Comment   Glucose-Capillary 109 (*) 70 - 99 mg/dL   CBC     Status: Abnormal   Collection Time   12/17/12  3:43 AM      Component Value Range Comment   WBC 7.3  4.0 - 10.5 K/uL    RBC 3.91 (*) 4.22 - 5.81 MIL/uL    Hemoglobin 11.7 (*) 13.0 - 17.0 g/dL    HCT 21.3 (*) 08.6 - 52.0 %    MCV 87.0  78.0 - 100.0 fL    MCH 29.9  26.0 - 34.0 pg    MCHC 34.4  30.0 - 36.0 g/dL    RDW 57.8  46.9 - 62.9 %    Platelets 226  150 - 400 K/uL   COMPREHENSIVE METABOLIC PANEL     Status: Abnormal   Collection Time   12/17/12  3:43 AM      Component Value Range Comment   Sodium 134 (*) 135 - 145 mEq/L    Potassium 4.0  3.5 - 5.1 mEq/L    Chloride 96  96 - 112 mEq/L    CO2 30  19 - 32 mEq/L  Glucose, Bld 131 (*) 70 - 99 mg/dL    BUN 3 (*) 6 - 23 mg/dL    Creatinine, Ser 1.61  0.50 - 1.35 mg/dL    Calcium 9.1  8.4 - 09.6 mg/dL    Total Protein 6.1  6.0 - 8.3 g/dL    Albumin 2.9 (*) 3.5 - 5.2 g/dL    AST 19  0 - 37 U/L    ALT 14  0 - 53 U/L    Alkaline Phosphatase 41  39 - 117 U/L    Total Bilirubin 0.9  0.3 - 1.2 mg/dL    GFR calc non Af Amer >90  >90 mL/min    GFR calc Af Amer >90  >90 mL/min   GLUCOSE, CAPILLARY     Status: Abnormal   Collection Time   12/17/12  4:13 AM      Component Value Range Comment   Glucose-Capillary 143 (*) 70 - 99 mg/dL   GLUCOSE, CAPILLARY     Status: Abnormal   Collection Time   12/17/12  7:30 AM      Component Value Range Comment   Glucose-Capillary 116 (*) 70 - 99 mg/dL   GLUCOSE, CAPILLARY     Status: Abnormal   Collection Time   12/17/12 11:23 AM      Component Value Range Comment   Glucose-Capillary 156 (*) 70 - 99 mg/dL   GLUCOSE, CAPILLARY     Status: Abnormal   Collection Time   12/17/12  3:36 PM      Component Value Range Comment   Glucose-Capillary 126 (*) 70 - 99 mg/dL   GLUCOSE, CAPILLARY     Status: Abnormal   Collection Time   12/17/12  7:29 PM      Component Value Range Comment   Glucose-Capillary 128 (*) 70 - 99 mg/dL    Comment 1 Notify RN       GLUCOSE, CAPILLARY     Status: Abnormal   Collection Time   12/17/12 11:13 PM      Component Value Range Comment   Glucose-Capillary 151 (*) 70 - 99 mg/dL    Comment 1 Notify RN     BASIC METABOLIC PANEL     Status: Abnormal   Collection Time   12/18/12  3:29 AM      Component Value Range Comment   Sodium 138  135 - 145 mEq/L    Potassium 3.5  3.5 - 5.1 mEq/L    Chloride 101  96 - 112 mEq/L    CO2 32  19 - 32 mEq/L    Glucose, Bld 92  70 - 99 mg/dL    BUN 5 (*) 6 - 23 mg/dL    Creatinine, Ser 0.45  0.50 - 1.35 mg/dL    Calcium 8.7  8.4 - 40.9 mg/dL    GFR calc non Af Amer >90  >90 mL/min    GFR calc Af Amer >90  >90 mL/min   CBC     Status: Abnormal   Collection Time   12/18/12  3:29 AM      Component Value Range Comment   WBC 4.8  4.0 - 10.5 K/uL    RBC 3.47 (*) 4.22 - 5.81 MIL/uL    Hemoglobin 10.2 (*) 13.0 - 17.0 g/dL    HCT 81.1 (*) 91.4 - 52.0 %    MCV 86.2  78.0 - 100.0 fL    MCH 29.4  26.0 - 34.0 pg    MCHC 34.1  30.0 -  36.0 g/dL    RDW 16.1  09.6 - 04.5 %    Platelets 177  150 - 400 K/uL   GLUCOSE, CAPILLARY     Status: Abnormal   Collection Time   12/18/12  7:35 AM      Component Value Range Comment   Glucose-Capillary 114 (*) 70 - 99 mg/dL     Radiology/Results: No results found.  Anti-infectives: Anti-infectives     Start     Dose/Rate Route Frequency Ordered Stop   12/12/12 1700   ciprofloxacin (CIPRO) IVPB 400 mg        400 mg 200 mL/hr over 60 Minutes Intravenous Every 12 hours 12/12/12 1603 12/12/12 1814   12/12/12 1700   metroNIDAZOLE (FLAGYL) IVPB 500 mg        500 mg 100 mL/hr over 60 Minutes Intravenous Every 8 hours 12/12/12 1603 12/13/12 0123   12/12/12 0519   metroNIDAZOLE (FLAGYL) IVPB 500 mg        500 mg 100 mL/hr over 60 Minutes Intravenous On call to O.R. 12/12/12 0519 12/12/12 0801   12/12/12 0519   ciprofloxacin (CIPRO) IVPB 400 mg        400 mg 200 mL/hr over 60 Minutes Intravenous On call to O.R. 12/12/12 0519 12/12/12 0729           Assessment/Plan: Problem List: Patient Active Problem List  Diagnosis  . Diabetes mellitus  . HTN (hypertension)  . Pancreatitis  . Abdominal pain  . Obstructive jaundice  . Biliary stricture  . 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   Metoprolol for HTN and brief atrial fibrillation D/c PCA.  toradol q8 and oxycodone elixir q8 and PRN SSI for DM Transfer to floor.   Pulmonary toilet.     LOS: 6 days   12/18/2012 11:16 AM

## 2012-12-19 LAB — BASIC METABOLIC PANEL
Calcium: 8.5 mg/dL (ref 8.4–10.5)
Creatinine, Ser: 0.64 mg/dL (ref 0.50–1.35)
GFR calc Af Amer: >90 mL/min (ref >90)
GFR calc non Af Amer: >90 mL/min (ref >90)

## 2012-12-19 LAB — GLUCOSE, CAPILLARY
Glucose-Capillary: 101 mg/dL — ABNORMAL HIGH (ref 70–99)
Glucose-Capillary: 110 mg/dL — ABNORMAL HIGH (ref 70–99)
Glucose-Capillary: 113 mg/dL — ABNORMAL HIGH (ref 70–99)
Glucose-Capillary: 119 mg/dL — ABNORMAL HIGH (ref 70–99)
Glucose-Capillary: 121 mg/dL — ABNORMAL HIGH (ref 70–99)

## 2012-12-19 LAB — CBC
MCH: 29.3 pg (ref 26.0–34.0)
MCHC: 34.2 g/dL (ref 30.0–36.0)
MCV: 85.8 fL (ref 78.0–100.0)
Platelets: 203 10*3/uL (ref 150–400)
RDW: 13.4 % (ref 11.5–15.5)

## 2012-12-19 MED ORDER — METOPROLOL SUCCINATE ER 100 MG PO TB24
100.0000 mg | ORAL_TABLET | Freq: Every day | ORAL | Status: DC
  Administered 2012-12-19 – 2012-12-22 (×4): 100 mg via ORAL
  Filled 2012-12-19 (×5): qty 1

## 2012-12-19 NOTE — Progress Notes (Signed)
Patient ID: Hector Pugh, male   DOB: 14-Jan-1961, 52 y.o.   MRN: [id number]  [facility] Surgery Progress Note:     Subjective: No BM yet.  Tolerated full liquids.  Oxycodone made nauseated.  Vicodin better.    Objective: Vital signs in last 24 hours: Temp:  [96.1 F (35.6 C)-98.4 F (36.9 C)] 98.3 F (36.8 C) (01/16 0555) Pulse Rate:  [70-85] 70  (01/16 0555) Resp:  [16-19] 16  (01/16 0555) BP: (122-153)/(41-97) 122/77 mmHg (01/16 0555) SpO2:  [99 %-100 %] 99 % (01/16 0555)  Intake/Output from previous day: 01/15 0701 - 01/16 0700 In: 1630 [P.O.:580; I.V.:1050] Out: 1002 [Urine:875; Drains:127] Intake/Output this shift:    Physical Exam:   Alert and oriented Wounds clean no drainage Drains serosang abd soft, approp tender.  Non distended  Lab Results:  Results for orders placed during the hospital encounter of 12/12/12 (from the past 48 hour(s))  GLUCOSE, CAPILLARY     Status: Abnormal   Collection Time   12/17/12 11:23 AM      Component Value Range Comment   Glucose-Capillary 156 (*) 70 - 99 mg/dL   GLUCOSE, CAPILLARY     Status: Abnormal   Collection Time   12/17/12  3:36 PM      Component Value Range Comment   Glucose-Capillary 126 (*) 70 - 99 mg/dL   GLUCOSE, CAPILLARY     Status: Abnormal   Collection Time   12/17/12  7:29 PM      Component Value Range Comment   Glucose-Capillary 128 (*) 70 - 99 mg/dL    Comment 1 Notify RN     GLUCOSE, CAPILLARY     Status: Abnormal   Collection Time   12/17/12 11:13 PM      Component Value Range Comment   Glucose-Capillary 151 (*) 70 - 99 mg/dL    Comment 1 Notify RN     BASIC METABOLIC PANEL     Status: Abnormal   Collection Time   12/18/12  3:29 AM      Component Value Range Comment   Sodium 138  135 - 145 mEq/L    Potassium 3.5  3.5 - 5.1 mEq/L    Chloride 101  96 - 112 mEq/L    CO2 32  19 - 32 mEq/L    Glucose, Bld 92  70 - 99 mg/dL    BUN 5 (*) 6 - 23 mg/dL    Creatinine, Ser 7.82  0.50 - 1.35 mg/dL    Calcium 8.7  8.4 - 95.6 mg/dL    GFR calc non Af Amer >90  >90 mL/min    GFR calc Af Amer >90  >90 mL/min   CBC     Status: Abnormal   Collection Time   12/18/12  3:29 AM      Component Value Range Comment   WBC 4.8  4.0 - 10.5 K/uL    RBC 3.47 (*) 4.22 - 5.81 MIL/uL    Hemoglobin 10.2 (*) 13.0 - 17.0 g/dL    HCT 21.3 (*) 08.6 - 52.0 %    MCV 86.2  78.0 - 100.0 fL    MCH 29.4  26.0 - 34.0 pg    MCHC 34.1  30.0 - 36.0 g/dL    RDW 57.8  46.9 - 62.9 %    Platelets 177  150 - 400 K/uL   GLUCOSE, CAPILLARY     Status: Abnormal   Collection Time   12/18/12  4:04 AM  Component Value Range Comment   Glucose-Capillary 109 (*) 70 - 99 mg/dL   GLUCOSE, CAPILLARY     Status: Abnormal   Collection Time   12/18/12  7:35 AM      Component Value Range Comment   Glucose-Capillary 114 (*) 70 - 99 mg/dL   GLUCOSE, CAPILLARY     Status: Abnormal   Collection Time   12/18/12 11:06 AM      Component Value Range Comment   Glucose-Capillary 146 (*) 70 - 99 mg/dL   GLUCOSE, CAPILLARY     Status: Abnormal   Collection Time   12/18/12  3:53 PM      Component Value Range Comment   Glucose-Capillary 124 (*) 70 - 99 mg/dL   GLUCOSE, CAPILLARY     Status: Abnormal   Collection Time   12/18/12  7:48 PM      Component Value Range Comment   Glucose-Capillary 176 (*) 70 - 99 mg/dL    Comment 1 Notify RN     GLUCOSE, CAPILLARY     Status: Abnormal   Collection Time   12/19/12 12:02 AM      Component Value Range Comment   Glucose-Capillary 117 (*) 70 - 99 mg/dL   GLUCOSE, CAPILLARY     Status: Abnormal   Collection Time   12/19/12  3:58 AM      Component Value Range Comment   Glucose-Capillary 110 (*) 70 - 99 mg/dL   CBC     Status: Abnormal   Collection Time   12/19/12  4:27 AM      Component Value Range Comment   WBC 4.6  4.0 - 10.5 K/uL    RBC 3.24 (*) 4.22 - 5.81 MIL/uL    Hemoglobin 9.5 (*) 13.0 - 17.0 g/dL    HCT 16.1 (*) 09.6 - 52.0 %    MCV 85.8  78.0 - 100.0 fL    MCH 29.3  26.0 - 34.0 pg     MCHC 34.2  30.0 - 36.0 g/dL    RDW 04.5  40.9 - 81.1 %    Platelets 203  150 - 400 K/uL   BASIC METABOLIC PANEL     Status: Abnormal   Collection Time   12/19/12  4:27 AM      Component Value Range Comment   Sodium 139  135 - 145 mEq/L    Potassium 3.7  3.5 - 5.1 mEq/L    Chloride 104  96 - 112 mEq/L    CO2 27  19 - 32 mEq/L    Glucose, Bld 111 (*) 70 - 99 mg/dL    BUN 6  6 - 23 mg/dL    Creatinine, Ser 9.14  0.50 - 1.35 mg/dL    Calcium 8.5  8.4 - 78.2 mg/dL    GFR calc non Af Amer >90  >90 mL/min    GFR calc Af Amer >90  >90 mL/min     Radiology/Results: No results found.  Anti-infectives: Anti-infectives     Start     Dose/Rate Route Frequency Ordered Stop   12/12/12 1700   ciprofloxacin (CIPRO) IVPB 400 mg        400 mg 200 mL/hr over 60 Minutes Intravenous Every 12 hours 12/12/12 1603 12/12/12 1814   12/12/12 1700   metroNIDAZOLE (FLAGYL) IVPB 500 mg        500 mg 100 mL/hr over 60 Minutes Intravenous Every 8 hours 12/12/12 1603 12/13/12 0123   12/12/12 0519   metroNIDAZOLE (FLAGYL) IVPB  500 mg        500 mg 100 mL/hr over 60 Minutes Intravenous On call to O.R. 12/12/12 4782 12/12/12 0801   12/12/12 0519   ciprofloxacin (CIPRO) IVPB 400 mg        400 mg 200 mL/hr over 60 Minutes Intravenous On call to O.R. 12/12/12 0519 12/12/12 0729          Assessment/Plan: Problem List: Patient Active Problem List  Diagnosis  . Diabetes mellitus  . HTN (hypertension)  . Pancreatitis  . Abdominal pain  . Obstructive jaundice  . Biliary stricture  . 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  vicodin for pain control.   SSI for DM.   Pulmonary toilet.     LOS: 7 days   12/19/2012 8:00 AM

## 2012-12-19 NOTE — Progress Notes (Signed)
Hand began to show puffiness at beginning of my shift at 1900.  Took out IV.  Noticed that pt's pain was under control via oral medication, pt is eating, and IV fluids were only at 20 mL/hr.  Paged MD on call to ask if metoprolol could be switched to PO.  No direct conversion via pharmacy but reccommended he go back on his home medication.  MD agreed.  Pt switched back to a 12 hour tablet of Lopressor at 100mg .  MD also said IV could remain out.    Sherron Monday

## 2012-12-20 LAB — GLUCOSE, CAPILLARY: Glucose-Capillary: 123 mg/dL — ABNORMAL HIGH (ref 70–99)

## 2012-12-20 MED ORDER — PANTOPRAZOLE SODIUM 40 MG PO TBEC
40.0000 mg | DELAYED_RELEASE_TABLET | Freq: Every day | ORAL | Status: DC
  Administered 2012-12-20 – 2012-12-22 (×3): 40 mg via ORAL
  Filled 2012-12-20 (×4): qty 1

## 2012-12-20 MED ORDER — HYDROCODONE-ACETAMINOPHEN 5-325 MG PO TABS
1.0000 | ORAL_TABLET | ORAL | Status: DC | PRN
  Administered 2012-12-20 – 2012-12-21 (×3): 1 via ORAL
  Filled 2012-12-20 (×4): qty 1

## 2012-12-20 MED ORDER — METOCLOPRAMIDE HCL 5 MG/ML IJ SOLN
5.0000 mg | Freq: Three times a day (TID) | INTRAMUSCULAR | Status: DC
  Administered 2012-12-20 – 2012-12-22 (×7): 5 mg via INTRAVENOUS
  Filled 2012-12-20: qty 2
  Filled 2012-12-20 (×6): qty 1
  Filled 2012-12-20: qty 2
  Filled 2012-12-20: qty 1

## 2012-12-20 MED ORDER — BISACODYL 10 MG RE SUPP
10.0000 mg | Freq: Every day | RECTAL | Status: DC
  Administered 2012-12-20 – 2012-12-21 (×2): 10 mg via RECTAL
  Filled 2012-12-20 (×3): qty 1

## 2012-12-20 MED ORDER — GLUCERNA SHAKE PO LIQD
237.0000 mL | Freq: Three times a day (TID) | ORAL | Status: DC
  Administered 2012-12-20 – 2012-12-22 (×6): 237 mL via ORAL
  Filled 2012-12-20 (×11): qty 237

## 2012-12-20 MED ORDER — KCL IN DEXTROSE-NACL 20-5-0.45 MEQ/L-%-% IV SOLN
INTRAVENOUS | Status: DC
  Administered 2012-12-20 – 2012-12-22 (×2): via INTRAVENOUS
  Filled 2012-12-20 (×4): qty 1000

## 2012-12-20 NOTE — Progress Notes (Signed)
Patient ID: Hector Pugh, male   DOB: [birth year], 52 y.o.   MRN: [id number]  [facility] Surgery Progress Note:     Subjective: Episode emesis this AM with undigested food from last night.  No good BM yet. Not really absorbing vicodin as well.     Objective: Vital signs in last 24 hours: Temp:  [97.3 F (36.3 C)-97.5 F (36.4 C)] 97.5 F (36.4 C) (01/17 0618) Pulse Rate:  [66-91] 76  (01/17 0618) Resp:  [17-18] 18  (01/17 0618) BP: (125-150)/(74-85) 150/85 mmHg (01/17 0618) SpO2:  [97 %-100 %] 97 % (01/17 0618)  Intake/Output from previous day: 01/16 0701 - 01/17 0700 In: 846.5 [P.O.:480; I.V.:366.5] Out: 575 [Urine:450; Drains:125] Intake/Output this shift:    Physical Exam:   Alert and oriented Wounds clean no drainage Drains serosang abd soft, approp tender.  Non distended  Lab Results:  Results for orders placed during the hospital encounter of 12/12/12 (from the past 48 hour(s))  GLUCOSE, CAPILLARY     Status: Abnormal   Collection Time   12/18/12 11:06 AM      Component Value Range Comment   Glucose-Capillary 146 (*) 70 - 99 mg/dL   GLUCOSE, CAPILLARY     Status: Abnormal   Collection Time   12/18/12  3:53 PM      Component Value Range Comment   Glucose-Capillary 124 (*) 70 - 99 mg/dL   GLUCOSE, CAPILLARY     Status: Abnormal   Collection Time   12/18/12  7:48 PM      Component Value Range Comment   Glucose-Capillary 176 (*) 70 - 99 mg/dL    Comment 1 Notify RN     GLUCOSE, CAPILLARY     Status: Abnormal   Collection Time   12/19/12 12:02 AM      Component Value Range Comment   Glucose-Capillary 117 (*) 70 - 99 mg/dL   GLUCOSE, CAPILLARY     Status: Abnormal   Collection Time   12/19/12  3:58 AM      Component Value Range Comment   Glucose-Capillary 110 (*) 70 - 99 mg/dL   CBC     Status: Abnormal   Collection Time   12/19/12  4:27 AM      Component Value Range Comment   WBC 4.6  4.0 - 10.5 K/uL    RBC 3.24 (*) 4.22 - 5.81 MIL/uL    Hemoglobin  9.5 (*) 13.0 - 17.0 g/dL    HCT 40.9 (*) 81.1 - 52.0 %    MCV 85.8  78.0 - 100.0 fL    MCH 29.3  26.0 - 34.0 pg    MCHC 34.2  30.0 - 36.0 g/dL    RDW 91.4  78.2 - 95.6 %    Platelets 203  150 - 400 K/uL   BASIC METABOLIC PANEL     Status: Abnormal   Collection Time   12/19/12  4:27 AM      Component Value Range Comment   Sodium 139  135 - 145 mEq/L    Potassium 3.7  3.5 - 5.1 mEq/L    Chloride 104  96 - 112 mEq/L    CO2 27  19 - 32 mEq/L    Glucose, Bld 111 (*) 70 - 99 mg/dL    BUN 6  6 - 23 mg/dL    Creatinine, Ser 2.13  0.50 - 1.35 mg/dL    Calcium 8.5  8.4 - 08.6 mg/dL    GFR calc non Af Amer >90  >  90 mL/min    GFR calc Af Amer >90  >90 mL/min   GLUCOSE, CAPILLARY     Status: Abnormal   Collection Time   12/19/12  8:13 AM      Component Value Range Comment   Glucose-Capillary 113 (*) 70 - 99 mg/dL   GLUCOSE, CAPILLARY     Status: Abnormal   Collection Time   12/19/12 12:33 PM      Component Value Range Comment   Glucose-Capillary 121 (*) 70 - 99 mg/dL    Comment 1 Notify RN     GLUCOSE, CAPILLARY     Status: Abnormal   Collection Time   12/19/12  5:17 PM      Component Value Range Comment   Glucose-Capillary 119 (*) 70 - 99 mg/dL    Comment 1 Notify RN     GLUCOSE, CAPILLARY     Status: Abnormal   Collection Time   12/19/12  8:54 PM      Component Value Range Comment   Glucose-Capillary 128 (*) 70 - 99 mg/dL    Comment 1 Notify RN     GLUCOSE, CAPILLARY     Status: Abnormal   Collection Time   12/19/12 11:50 PM      Component Value Range Comment   Glucose-Capillary 101 (*) 70 - 99 mg/dL    Comment 1 Notify RN     GLUCOSE, CAPILLARY     Status: Abnormal   Collection Time   12/20/12  4:12 AM      Component Value Range Comment   Glucose-Capillary 104 (*) 70 - 99 mg/dL    Comment 1 Notify RN     GLUCOSE, CAPILLARY     Status: Abnormal   Collection Time   12/20/12  7:41 AM      Component Value Range Comment   Glucose-Capillary 109 (*) 70 - 99 mg/dL      Radiology/Results: No results found.  Anti-infectives: Anti-infectives     Start     Dose/Rate Route Frequency Ordered Stop   12/12/12 1700   ciprofloxacin (CIPRO) IVPB 400 mg        400 mg 200 mL/hr over 60 Minutes Intravenous Every 12 hours 12/12/12 1603 12/12/12 1814   12/12/12 1700   metroNIDAZOLE (FLAGYL) IVPB 500 mg        500 mg 100 mL/hr over 60 Minutes Intravenous Every 8 hours 12/12/12 1603 12/13/12 0123   12/12/12 0519   metroNIDAZOLE (FLAGYL) IVPB 500 mg        500 mg 100 mL/hr over 60 Minutes Intravenous On call to O.R. 12/12/12 0519 12/12/12 0801   12/12/12 0519   ciprofloxacin (CIPRO) IVPB 400 mg        400 mg 200 mL/hr over 60 Minutes Intravenous On call to O.R. 12/12/12 0519 12/12/12 0729          Assessment/Plan: Problem List: Patient Active Problem List  Diagnosis  . Diabetes mellitus  . HTN (hypertension)  . Pancreatitis  . Abdominal pain  . Obstructive jaundice  . Biliary stricture  . 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 D/C one JP Add glucerna Add back some IVF   SSI for DM.   Pulmonary toilet.     LOS: 8 days   12/20/2012 8:27 AM

## 2012-12-20 NOTE — Progress Notes (Signed)
INITIAL NUTRITION ASSESSMENT  DOCUMENTATION CODES Per approved criteria  -Not Applicable   INTERVENTION: - Discussed bland vegetarian menu options and assisted pt with ordering meals  - Will continue to monitor  NUTRITION DIAGNOSIS: Predicted suboptimal energy intake related to earlier nausea/vomiting as evidenced by progress notes.   Goal: 1. No further nausea/vomiting 2. Pt to consume >90% of meals  Monitor:  Weights, labs, intake, nausea/vomiting  Reason for Assessment: Nutrition risk   52 y.o. male  Admitting Dx: Planned whipple procedure  ASSESSMENT: Pt reports eating 3 meals/day at home of a vegetarian diet. Pt reports typically weighing 147-148 pounds. Past records show his weight has dropped 9 pounds unintentionally in the past month. POD# 8 whipple procedure. Pt had one episode of emesis this morning however denies any nausea or vomiting since then.  Height: Ht Readings from Last 1 Encounters:  12/12/12 5\' 3"  (1.6 m)    Weight: Wt Readings from Last 1 Encounters:  12/17/12 141 lb 12.1 oz (64.3 kg)    Ideal Body Weight: 115 lb  % Ideal Body Weight: 123  Wt Readings from Last 10 Encounters:  12/17/12 141 lb 12.1 oz (64.3 kg)  12/17/12 141 lb 12.1 oz (64.3 kg)  12/09/12 151 lb 11.2 oz (68.811 kg)  12/06/12 149 lb (67.586 kg)  11/09/12 150 lb (68.04 kg)  11/04/12 150 lb (68.04 kg)    Usual Body Weight: 147-148 lb  % Usual Body Weight: 95-96  BMI:  Body mass index is 25.11 kg/(m^2).  Estimated Nutritional Needs: Kcal: 1300-1600 Protein: 65-75g Fluid: 1.3-1.6L/day   Diet Order: Fat Restricted  EDUCATION NEEDS: -Education needs addressed - briefly discussed bland foods for nausea   Intake/Output Summary (Last 24 hours) at 12/20/12 1738 Last data filed at 12/20/12 1358  Gross per 24 hour  Intake 1080.33 ml  Output    890 ml  Net 190.33 ml    Last BM: 1/16  Labs:   Lab 12/19/12 0427 12/18/12 0329 12/17/12 0343 12/15/12 0623 12/14/12  0515  NA 139 138 134* -- --  K 3.7 3.5 4.0 -- --  CL 104 101 96 -- --  CO2 27 32 30 -- --  BUN 6 5* 3* -- --  CREATININE 0.64 0.68 0.66 -- --  CALCIUM 8.5 8.7 9.1 -- --  MG -- -- -- 1.6 1.8  PHOS -- -- -- 2.2* 1.7*  GLUCOSE 111* 92 131* -- --    CBG (last 3)   Basename 12/20/12 1623 12/20/12 1216 12/20/12 0741  GLUCAP 123* 117* 109*    Scheduled Meds:   . antiseptic oral rinse  15 mL Mouth Rinse q12n4p  . bisacodyl  10 mg Rectal Daily  . chlorhexidine  15 mL Mouth Rinse BID  . enoxaparin  40 mg Subcutaneous Q24H  . feeding supplement  237 mL Oral TID BM  . insulin aspart  0-9 Units Subcutaneous Q4H  . insulin glargine  10 Units Subcutaneous Daily  . metoCLOPramide (REGLAN) injection  5 mg Intravenous Q8H  . metoprolol succinate  100 mg Oral QHS  . pantoprazole  40 mg Oral Q1200    Continuous Infusions:   . dextrose 5 % and 0.45 % NaCl with KCl 20 mEq/L 40 mL/hr at 12/20/12 1478    Past Medical History  Diagnosis Date  . Hypertension   . HTN (hypertension) 11/10/2012  . Pancreatitis 11/10/2012  . 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 Biliary stricture 11/11/2012    S/p biliary stent  11/08/12  . Seasonal allergies   . Diabetes mellitus without complication     diet controlled  . Diabetes mellitus 11/10/2012    Past Surgical History  Procedure Date  . Circucision   . Hernia repair     RIGHT  . Left foot surgery   . Vasectomy   . Circumsision   . Left foot surgery   . Eus 11/08/2012    Procedure: UPPER ENDOSCOPIC ULTRASOUND (EUS) LINEAR;  Surgeon: Willis Modena, MD;  Location: [facility];  Service: Endoscopy;  Laterality: N/A;  . Fine needle aspiration 11/08/2012    Procedure: FINE NEEDLE ASPIRATION (FNA) LINEAR;  Surgeon: Willis Modena, MD;  Location: [facility];  Service: Endoscopy;  Laterality: N/A;  . Ercp 11/08/2012    Procedure: ENDOSCOPIC RETROGRADE CHOLANGIOPANCREATOGRAPHY (ERCP);  Surgeon: Petra Kuba, MD;  Location: [facility];  Service: Gastroenterology;  Laterality: N/A;  . Laparoscopy 12/12/2012    Procedure: LAPAROSCOPY DIAGNOSTIC;  Surgeon: Almond Lint, MD;  Location: [facility];  Service: General;  Laterality: N/A;  . Whipple procedure 12/12/2012    Procedure: WHIPPLE PROCEDURE;  Surgeon: Almond Lint, MD;  Location: [facility];  Service: General;  Laterality: N/A;    Levon Hedger MS, RD, LDN [phone] Pager [phone] After Hours Pager

## 2012-12-21 LAB — BASIC METABOLIC PANEL
BUN: 6 mg/dL (ref 6–23)
CO2: 25 mEq/L (ref 19–32)
Chloride: 102 mEq/L (ref 96–112)
Creatinine, Ser: 0.66 mg/dL (ref 0.50–1.35)
Glucose, Bld: 95 mg/dL (ref 70–99)
Potassium: 3.3 mEq/L — ABNORMAL LOW (ref 3.5–5.1)

## 2012-12-21 LAB — GLUCOSE, CAPILLARY
Glucose-Capillary: 92 mg/dL (ref 70–99)
Glucose-Capillary: 128 mg/dL — ABNORMAL HIGH (ref 70–99)
Glucose-Capillary: 133 mg/dL — ABNORMAL HIGH (ref 70–99)
Glucose-Capillary: 107 mg/dL — ABNORMAL HIGH (ref 70–99)

## 2012-12-21 LAB — CBC
HCT: 28.4 % — ABNORMAL LOW (ref 39.0–52.0)
Hemoglobin: 9.6 g/dL — ABNORMAL LOW (ref 13.0–17.0)
MCV: 85.8 fL (ref 78.0–100.0)
WBC: 4.8 10*3/uL (ref 4.0–10.5)

## 2012-12-21 MED ORDER — INSULIN ASPART 100 UNIT/ML ~~LOC~~ SOLN: 0.0000 [IU] | Freq: Three times a day (TID) | SUBCUTANEOUS | Status: DC

## 2012-12-21 MED ORDER — TRAMADOL HCL 50 MG PO TABS
100.0000 mg | ORAL_TABLET | Freq: Two times a day (BID) | ORAL | Status: DC | PRN
  Administered 2012-12-21 – 2012-12-22 (×4): 50 mg via ORAL
  Filled 2012-12-21 (×3): qty 1
  Filled 2012-12-21: qty 2

## 2012-12-21 NOTE — Progress Notes (Signed)
Patient ID: Hector Pugh, male   DOB: 1961/05/12, 52 y.o.   MRN: [id number]  [facility] Surgery Progress Note:     Subjective: Episode emesis this AM with undigested food from last night.  No good BM yet. Not really absorbing vicodin as well.     Objective: Vital signs in last 24 hours: Temp:  [97.2 F (36.2 C)-98.7 F (37.1 C)] 97.8 F (36.6 C) (01/18 1358) Pulse Rate:  [60-80] 60  (01/18 1358) Resp:  [14-18] 18  (01/18 1358) BP: (130-149)/(77-84) 130/80 mmHg (01/18 1358) SpO2:  [98 %-100 %] 100 % (01/18 1358)  Intake/Output from previous day: 01/17 0701 - 01/18 0700 In: 1881.7 [P.O.:960; I.V.:921.7] Out: 1485 [Urine:1425; Drains:60] Intake/Output this shift: Total I/O In: 840 [P.O.:840] Out: 420 [Urine:400; Drains:20]  Physical Exam:   Alert and oriented Wounds clean no drainage Drains serosang abd soft, approp tender.  Non distended  Lab Results:  Results for orders placed during the hospital encounter of 12/12/12 (from the past 48 hour(s))  GLUCOSE, CAPILLARY     Status: Abnormal   Collection Time   12/19/12  5:17 PM      Component Value Range Comment   Glucose-Capillary 119 (*) 70 - 99 mg/dL    Comment 1 Notify RN     GLUCOSE, CAPILLARY     Status: Abnormal   Collection Time   12/19/12  8:54 PM      Component Value Range Comment   Glucose-Capillary 128 (*) 70 - 99 mg/dL    Comment 1 Notify RN     GLUCOSE, CAPILLARY     Status: Abnormal   Collection Time   12/19/12 11:50 PM      Component Value Range Comment   Glucose-Capillary 101 (*) 70 - 99 mg/dL    Comment 1 Notify RN     GLUCOSE, CAPILLARY     Status: Abnormal   Collection Time   12/20/12  4:12 AM      Component Value Range Comment   Glucose-Capillary 104 (*) 70 - 99 mg/dL    Comment 1 Notify RN     GLUCOSE, CAPILLARY     Status: Abnormal   Collection Time   12/20/12  7:41 AM      Component Value Range Comment   Glucose-Capillary 109 (*) 70 - 99 mg/dL   GLUCOSE, CAPILLARY     Status: Abnormal    Collection Time   12/20/12 12:16 PM      Component Value Range Comment   Glucose-Capillary 117 (*) 70 - 99 mg/dL   GLUCOSE, CAPILLARY     Status: Abnormal   Collection Time   12/20/12  4:23 PM      Component Value Range Comment   Glucose-Capillary 123 (*) 70 - 99 mg/dL   GLUCOSE, CAPILLARY     Status: Abnormal   Collection Time   12/20/12  8:02 PM      Component Value Range Comment   Glucose-Capillary 129 (*) 70 - 99 mg/dL   GLUCOSE, CAPILLARY     Status: Normal   Collection Time   12/20/12 11:56 PM      Component Value Range Comment   Glucose-Capillary 96  70 - 99 mg/dL   GLUCOSE, CAPILLARY     Status: Normal   Collection Time   12/21/12  3:54 AM      Component Value Range Comment   Glucose-Capillary 92  70 - 99 mg/dL   CBC     Status: Abnormal   Collection Time  12/21/12  4:45 AM      Component Value Range Comment   WBC 4.8  4.0 - 10.5 K/uL    RBC 3.31 (*) 4.22 - 5.81 MIL/uL    Hemoglobin 9.6 (*) 13.0 - 17.0 g/dL    HCT 16.1 (*) 09.6 - 52.0 %    MCV 85.8  78.0 - 100.0 fL    MCH 29.0  26.0 - 34.0 pg    MCHC 33.8  30.0 - 36.0 g/dL    RDW 04.5  40.9 - 81.1 %    Platelets 236  150 - 400 K/uL   BASIC METABOLIC PANEL     Status: Abnormal   Collection Time   12/21/12  4:45 AM      Component Value Range Comment   Sodium 136  135 - 145 mEq/L    Potassium 3.3 (*) 3.5 - 5.1 mEq/L    Chloride 102  96 - 112 mEq/L    CO2 25  19 - 32 mEq/L    Glucose, Bld 95  70 - 99 mg/dL    BUN 6  6 - 23 mg/dL    Creatinine, Ser 9.14  0.50 - 1.35 mg/dL    Calcium 8.6  8.4 - 78.2 mg/dL    GFR calc non Af Amer >90  >90 mL/min    GFR calc Af Amer >90  >90 mL/min   GLUCOSE, CAPILLARY     Status: Abnormal   Collection Time   12/21/12  7:49 AM      Component Value Range Comment   Glucose-Capillary 128 (*) 70 - 99 mg/dL   GLUCOSE, CAPILLARY     Status: Abnormal   Collection Time   12/21/12 11:51 AM      Component Value Range Comment   Glucose-Capillary 133 (*) 70 - 99 mg/dL      Radiology/Results: No results found.  Anti-infectives: Anti-infectives     Start     Dose/Rate Route Frequency Ordered Stop   12/12/12 1700   ciprofloxacin (CIPRO) IVPB 400 mg        400 mg 200 mL/hr over 60 Minutes Intravenous Every 12 hours 12/12/12 1603 12/12/12 1814   12/12/12 1700   metroNIDAZOLE (FLAGYL) IVPB 500 mg        500 mg 100 mL/hr over 60 Minutes Intravenous Every 8 hours 12/12/12 1603 12/13/12 0123   12/12/12 0519   metroNIDAZOLE (FLAGYL) IVPB 500 mg        500 mg 100 mL/hr over 60 Minutes Intravenous On call to O.R. 12/12/12 0519 12/12/12 0801   12/12/12 0519   ciprofloxacin (CIPRO) IVPB 400 mg        400 mg 200 mL/hr over 60 Minutes Intravenous On call to O.R. 12/12/12 0519 12/12/12 0729          Assessment/Plan: Problem List: Patient Active Problem List  Diagnosis  . Diabetes mellitus  . HTN (hypertension)  . Pancreatitis  . Abdominal pain  . Obstructive jaundice  . Biliary stricture  . 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 Cont glucerna Cont  IVF   SSI for DM.   Pulmonary toilet.     LOS: 9 days   12/21/2012 2:08 PM

## 2012-12-21 NOTE — Progress Notes (Signed)
Pt on low fiber diet. Can we have order to change CBG to ACHS?

## 2012-12-22 LAB — GLUCOSE, CAPILLARY
Glucose-Capillary: 103 mg/dL — ABNORMAL HIGH (ref 70–99)
Glucose-Capillary: 106 mg/dL — ABNORMAL HIGH (ref 70–99)

## 2012-12-22 MED ORDER — DOCUSATE SODIUM 100 MG PO CAPS
100.0000 mg | ORAL_CAPSULE | Freq: Two times a day (BID) | ORAL | Status: DC
  Administered 2012-12-22 (×2): 100 mg via ORAL
  Filled 2012-12-22 (×4): qty 1

## 2012-12-22 MED ORDER — BISACODYL 10 MG RE SUPP: 10.0000 mg | Freq: Every day | RECTAL | Status: DC | PRN

## 2012-12-22 MED ORDER — METOCLOPRAMIDE HCL 5 MG PO TABS
5.0000 mg | ORAL_TABLET | Freq: Three times a day (TID) | ORAL | Status: DC
  Administered 2012-12-22 (×2): 5 mg via ORAL
  Filled 2012-12-22 (×5): qty 1

## 2012-12-22 MED ORDER — TRAMADOL HCL 50 MG PO TABS
50.0000 mg | ORAL_TABLET | Freq: Four times a day (QID) | ORAL | Status: DC | PRN
  Administered 2012-12-22 – 2012-12-23 (×2): 50 mg via ORAL
  Filled 2012-12-22 (×2): qty 1

## 2012-12-22 MED ORDER — METOCLOPRAMIDE HCL 10 MG PO TABS: 5.0000 mg | ORAL_TABLET | Freq: Three times a day (TID) | ORAL | Status: DC | PRN

## 2012-12-22 NOTE — Progress Notes (Signed)
Pt has a dime sized blister on the anterior part of his incision.  Notified physician.  No new orders taken.

## 2012-12-22 NOTE — Progress Notes (Signed)
Patient ID: Hector Pugh, male   DOB: [birth year], 52 y.o.   MRN: [id number]  [facility] Progress Note:     Subjective: Did well with diet and tramadol for pain.  Good UOP  Objective: Vital signs in last 24 hours: Temp:  [97.5 F (36.4 C)-97.9 F (36.6 C)] 97.9 F (36.6 C) (01/19 0625) Pulse Rate:  [60-78] 66  (01/19 0625) Resp:  [18] 18  (01/19 0625) BP: (130-146)/(80-90) 146/84 mmHg (01/19 0625) SpO2:  [100 %] 100 % (01/19 0625)  Intake/Output from previous day: 01/18 0701 - 01/19 0700 In: 2278 [P.O.:1280; I.V.:998] Out: 1785 [Urine:1725; Drains:60] Intake/Output this shift: Total I/O In: 240 [P.O.:240] Out: -   Physical Exam:   Alert and oriented Wounds clean, no drainage, enlarging blister at midline wound with no signs of erythema Drains serous abd soft, approp tender.  Non distended  Lab Results:  Results for orders placed during the hospital encounter of 12/12/12 (from the past 48 hour(s))  GLUCOSE, CAPILLARY     Status: Abnormal   Collection Time   12/20/12 12:16 PM      Component Value Range Comment   Glucose-Capillary 117 (*) 70 - 99 mg/dL   GLUCOSE, CAPILLARY     Status: Abnormal   Collection Time   12/20/12  4:23 PM      Component Value Range Comment   Glucose-Capillary 123 (*) 70 - 99 mg/dL   GLUCOSE, CAPILLARY     Status: Abnormal   Collection Time   12/20/12  8:02 PM      Component Value Range Comment   Glucose-Capillary 129 (*) 70 - 99 mg/dL   GLUCOSE, CAPILLARY     Status: Normal   Collection Time   12/20/12 11:56 PM      Component Value Range Comment   Glucose-Capillary 96  70 - 99 mg/dL   GLUCOSE, CAPILLARY     Status: Normal   Collection Time   12/21/12  3:54 AM      Component Value Range Comment   Glucose-Capillary 92  70 - 99 mg/dL   CBC     Status: Abnormal   Collection Time   12/21/12  4:45 AM      Component Value Range Comment   WBC 4.8  4.0 - 10.5 K/uL    RBC 3.31 (*) 4.22 - 5.81 MIL/uL    Hemoglobin 9.6 (*) 13.0 -  17.0 g/dL    HCT 40.9 (*) 81.1 - 52.0 %    MCV 85.8  78.0 - 100.0 fL    MCH 29.0  26.0 - 34.0 pg    MCHC 33.8  30.0 - 36.0 g/dL    RDW 91.4  78.2 - 95.6 %    Platelets 236  150 - 400 K/uL   BASIC METABOLIC PANEL     Status: Abnormal   Collection Time   12/21/12  4:45 AM      Component Value Range Comment   Sodium 136  135 - 145 mEq/L    Potassium 3.3 (*) 3.5 - 5.1 mEq/L    Chloride 102  96 - 112 mEq/L    CO2 25  19 - 32 mEq/L    Glucose, Bld 95  70 - 99 mg/dL    BUN 6  6 - 23 mg/dL    Creatinine, Ser 2.13  0.50 - 1.35 mg/dL    Calcium 8.6  8.4 - 08.6 mg/dL    GFR calc non Af Amer >90  >90 mL/min    GFR  calc Af Amer >90  >90 mL/min   GLUCOSE, CAPILLARY     Status: Abnormal   Collection Time   12/21/12  7:49 AM      Component Value Range Comment   Glucose-Capillary 128 (*) 70 - 99 mg/dL   GLUCOSE, CAPILLARY     Status: Abnormal   Collection Time   12/21/12 11:51 AM      Component Value Range Comment   Glucose-Capillary 133 (*) 70 - 99 mg/dL   GLUCOSE, CAPILLARY     Status: Abnormal   Collection Time   12/21/12  5:49 PM      Component Value Range Comment   Glucose-Capillary 107 (*) 70 - 99 mg/dL   GLUCOSE, CAPILLARY     Status: Abnormal   Collection Time   12/21/12 10:19 PM      Component Value Range Comment   Glucose-Capillary 129 (*) 70 - 99 mg/dL   GLUCOSE, CAPILLARY     Status: Abnormal   Collection Time   12/22/12  8:26 AM      Component Value Range Comment   Glucose-Capillary 103 (*) 70 - 99 mg/dL     Radiology/Results: No results found.  Anti-infectives: Anti-infectives     Start     Dose/Rate Route Frequency Ordered Stop   12/12/12 1700   ciprofloxacin (CIPRO) IVPB 400 mg        400 mg 200 mL/hr over 60 Minutes Intravenous Every 12 hours 12/12/12 1603 12/12/12 1814   12/12/12 1700   metroNIDAZOLE (FLAGYL) IVPB 500 mg        500 mg 100 mL/hr over 60 Minutes Intravenous Every 8 hours 12/12/12 1603 12/13/12 0123   12/12/12 0519   metroNIDAZOLE (FLAGYL) IVPB  500 mg        500 mg 100 mL/hr over 60 Minutes Intravenous On call to O.R. 12/12/12 0519 12/12/12 0801   12/12/12 0519   ciprofloxacin (CIPRO) IVPB 400 mg        400 mg 200 mL/hr over 60 Minutes Intravenous On call to O.R. 12/12/12 0519 12/12/12 0729          Assessment/Plan: Problem List: Patient Active Problem List  Diagnosis  . Diabetes mellitus  . HTN (hypertension)  . Pancreatitis  . Abdominal pain  . Obstructive jaundice  . Biliary stricture  . 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 Cont glucerna D/c IVF SSI for DM.  Blood sugars controlled Pulmonary toilet.   Hopefully home in AM if able to drink enough to keep himself hydrated   LOS: 10 days   12/22/2012 10:23 AM

## 2012-12-23 LAB — GLUCOSE, CAPILLARY: Glucose-Capillary: 88 mg/dL (ref 70–99)

## 2012-12-23 MED ORDER — METOCLOPRAMIDE HCL 5 MG PO TABS: 5.0000 mg | ORAL_TABLET | Freq: Three times a day (TID) | ORAL | Status: DC

## 2012-12-23 MED ORDER — TRAMADOL HCL 50 MG PO TABS: 50.0000 mg | ORAL_TABLET | Freq: Four times a day (QID) | ORAL | Status: DC | PRN

## 2012-12-23 MED ORDER — DSS 100 MG PO CAPS: 100.0000 mg | ORAL_CAPSULE | Freq: Two times a day (BID) | ORAL | Status: DC

## 2012-12-23 MED ORDER — HYDROCODONE-ACETAMINOPHEN 5-325 MG PO TABS: 1.0000 | ORAL_TABLET | Freq: Four times a day (QID) | ORAL | Status: DC | PRN

## 2012-12-23 MED ORDER — GLUCERNA SHAKE PO LIQD: 237.0000 mL | Freq: Three times a day (TID) | ORAL | Status: DC

## 2012-12-23 NOTE — Discharge Summary (Signed)
Physician Discharge Summary  Patient ID: Hector Pugh MRN: [id number] DOB/AGE: Jan 30, 1961 52 y.o.  Admit date: 12/12/2012 Discharge date: 12/23/2012  Admission Diagnoses: Ampullary carcinoma- primary diagnosis DM HTN Pancreatitis  Discharge Diagnoses:  Same  Discharged Condition: good  Hospital Course:  Pt admitted to the ICU following Whipple procedure for ampullary mass and biliary obstruction.  He had some oliguria and low blood pressure overnight.  He had several bouts of tachycardia and brief runs of atrial fibrillation for around 4-5 days.  These resolved with beta blockade.  He was hydrated more aggressively as well.  His NGT was in 4 days.  This was removed and he was able to tolerate clears.  He was able to void with foley catheter removal.  He required several switches of pain medication in order to get comfortable.  He was able to ambulate with minimal assistance.  His drain output was serosanguinous.   His diet was able to be advanced once he started having flatus.  However, he experienced nausea and vomiting the 24 hours of having regular diet.  He was placed on reglan and then was able to tolerate regular diet.  He was discharged to home in improved condition.  His blood sugars were below 150.  He was instructed to restart metformin at home if blood sugars were over 150.  He was instructed to follow up with PCP in a few weeks.  I advised him that if sugars were over 200 to call PCP.    Consults: None  Significant Diagnostic Studies: labs: HCT prior to d/c 28.4  Treatments: surgery: whipple  Discharge Exam: Blood pressure 120/71, pulse 65, temperature 97.8 F (36.6 C), temperature source Oral, resp. rate 18, height 5\' 3"  (1.6 m), weight 141 lb 12.1 oz (64.3 kg), SpO2 100.00%. General appearance: alert, cooperative and no distress Resp: breathing comfortably GI: soft, approp tender at incision.  few blisters at steristrip sites.  no drainage or erythema Extremities:  extremities normal, atraumatic, no cyanosis or edema  Disposition: 01-Home or Self Care  Discharge Orders    Future Appointments: Provider: Department: Dept Phone: Center:   01/10/2013 10:15 AM Almond Lint, MD [facility] [phone] None       Medication List     As of 12/23/2012  8:59 AM    ASK your doctor about these medications         acetaminophen 500 MG tablet   Commonly known as: TYLENOL   Take 500 mg by mouth every 4 (four) hours as needed.      loratadine 10 MG tablet   Commonly known as: CLARITIN   Take 10 mg by mouth daily as needed. For allergies      metoprolol succinate 100 MG 24 hr tablet   Commonly known as: TOPROL-XL   Take 100 mg by mouth daily before breakfast. Take with or immediately following a meal.           Follow-up Information    Follow up with [facility], MD. In 2 weeks.   Contact information:   [address] [place] [address] [phone]          Signed: Almond Lint 12/23/2012, 8:59 AM

## 2012-12-23 NOTE — Care Management Note (Signed)
    Page 1 of 1   12/23/2012     12:04:38 PM   CARE MANAGEMENT NOTE 12/23/2012  Patient:  Hector Pugh,Hector Pugh   Account Number:  [id number]  Date Initiated:  12/13/2012  Documentation initiated by:  DAVIS,RHONDA  Subjective/Objective Assessment:   patient with history of ca of the ampulla now having whipple procedure done     Action/Plan:   lives at home   Anticipated DC Date:  12/23/2012   Anticipated DC Plan:  HOME/SELF CARE      DC Planning Services  CM consult      Choice offered to / List presented to:             Status of service:  Completed, signed off Medicare Important Message given?   (If response is "NO", the following Medicare IM given date fields will be blank) Date Medicare IM given:   Date Additional Medicare IM given:    Discharge Disposition:  HOME/SELF CARE  Per UR Regulation:  Reviewed for med. necessity/level of care/duration of stay  If discussed at Long Length of Stay Meetings, dates discussed:    Comments:  [id number]/Rhonda Earlene Plater, RN, BSN, CCM: CHART REVIEWED AND UPDATED.  Next chart review due on [id number]. NO DISCHARGE NEEDS PRESENT AT THIS TIME.  Patient moved to sdu from icu, systolic bp remains elevated to 160's reuiring iv bp meds. CASE MANAGEMENT [phone]   [id number]/NWGNFA Earlene Plater, RN, BSN, CCM: CHART REVIEWED AND UPDATED.  Next chart review due on [id number]. NO DISCHARGE NEEDS PRESENT AT THIS TIME. CASE MANAGEMENT [phone]

## 2012-12-25 ENCOUNTER — Telehealth (INDEPENDENT_AMBULATORY_CARE_PROVIDER_SITE_OTHER): Payer: Self-pay | Admitting: General Surgery

## 2012-12-25 ENCOUNTER — Other Ambulatory Visit (INDEPENDENT_AMBULATORY_CARE_PROVIDER_SITE_OTHER): Payer: Self-pay | Admitting: General Surgery

## 2012-12-25 DIAGNOSIS — C241 Malignant neoplasm of ampulla of Vater: Secondary | ICD-10-CM

## 2012-12-25 NOTE — Telephone Encounter (Signed)
Daughter called to ask about how to care for blisters that developed as reaction to glue on skin.  MD was aware of them at the hospital.  She states none of them has ruptured.  Reassured daughter that if they spontaneously rupture, just wash with soap and water, then leave open to air to scab over.  She understands.

## 2012-12-27 ENCOUNTER — Telehealth (INDEPENDENT_AMBULATORY_CARE_PROVIDER_SITE_OTHER): Payer: Self-pay

## 2012-12-27 ENCOUNTER — Telehealth: Payer: Self-pay | Admitting: *Deleted

## 2012-12-27 NOTE — Telephone Encounter (Signed)
Spoke with patient by phone and gave appointment with Dr. Truett Perna for 01/09/13.  Patient verbalized comprehension and confirmed the appointment.  Contact names and phone numbers were given to patient.

## 2012-12-27 NOTE — Telephone Encounter (Signed)
Informed pt's son that bacitracin or any antibiotic ointment can be applied to the blisters and then cover with a non-stick dressing.  Do not "pop" these blisters due to an increased risk of infection.  Call if worsening symptoms.

## 2013-01-09 ENCOUNTER — Encounter: Payer: Self-pay | Admitting: Oncology

## 2013-01-09 ENCOUNTER — Ambulatory Visit: Payer: BC Managed Care – PPO

## 2013-01-09 ENCOUNTER — Ambulatory Visit (HOSPITAL_BASED_OUTPATIENT_CLINIC_OR_DEPARTMENT_OTHER): Payer: BC Managed Care – PPO | Admitting: Oncology

## 2013-01-09 ENCOUNTER — Other Ambulatory Visit: Payer: Self-pay | Admitting: *Deleted

## 2013-01-09 ENCOUNTER — Other Ambulatory Visit: Payer: BC Managed Care – PPO | Admitting: Lab

## 2013-01-09 ENCOUNTER — Ambulatory Visit (HOSPITAL_BASED_OUTPATIENT_CLINIC_OR_DEPARTMENT_OTHER): Payer: BC Managed Care – PPO | Admitting: Lab

## 2013-01-09 ENCOUNTER — Telehealth: Payer: Self-pay | Admitting: Oncology

## 2013-01-09 VITALS — BP 132/77 | HR 61 | Temp 97.6°F | Resp 18 | Ht 65.5 in | Wt 139.5 lb

## 2013-01-09 DIAGNOSIS — R599 Enlarged lymph nodes, unspecified: Secondary | ICD-10-CM

## 2013-01-09 DIAGNOSIS — C241 Malignant neoplasm of ampulla of Vater: Secondary | ICD-10-CM

## 2013-01-09 LAB — CANCER ANTIGEN 19-9: CA 19-9: 31.5 U/mL (ref <35.0)

## 2013-01-09 NOTE — Progress Notes (Signed)
[facility] New Patient Consult   Referring MD: Allen Egerton 52 y.o.  Nov 06, 1961    Reason for Referral: Ampullary carcinoma     HPI: He developed jaundice on 11/04/2012 and presents emergency room. A CT of the abdomen revealed a dilated common bile duct and an area of focal low attenuation at the ampulla measuring 1.4 x 1.5 cm. The spleen and adrenal glands appeared normal. No upper abdominal or pelvic adenopathy. He was referred to Dr. Ewing Schlein and underwent an ERCP procedure on 11/08/2012. An ampullary mass was noted with a bile duct stricture. A stent was placed. He underwent an EUS procedure by Dr. Dulce Sellar on 11/08/2012. The bile duct and pancreatic duct were dilated up strain of an 18 x 20 mm ampulla lesion. The pancreas did not appear to be invaded by the lesion. FNA biopsies were obtained. No adenopathy and no involvement of the superior mesenteric artery, celiac artery, or portal vein. The biopsy confirmed adenocarcinoma.  He developed post ERCP pancreatitis and was admitted between 11/09/2012 and 11/16/2012.  He was referred to Dr. Donell Beers and was taken to surgery on 12/12/2012. A diagnostic laparoscopy and pancreaticoduodenectomy were performed. Changes of pancreatitis and a 1 cm ampullary mass were noted. The pathology ([id number]) revealed an invasive well-differentiated ampullary carcinoma measuring 2.2 cm in greatest dimension. The tumor was limited to the ampulla of Vater without involvement of the duodenum. Lymphovascular invasion was not identified. The surgical margins were negative. 11 lymph nodes were negative.  He reports an uneventful operative recovery. He is referred to consider the indication for adjuvant therapy.  He reports being diagnosed with elevated liver enzymes and diabetes in November of 2013.   Past Medical History  Diagnosis Date  . Hypertension   . HTN (hypertension) 11/10/2012  . Pancreatitis 11/10/2012  . Obstructive  jaundice 11/11/2012    With ampullary mass  . Biliary stricture 11/11/2012    S/p biliary stent 11/08/12  . Seasonal allergies   . Diabetes mellitus without complication     diet controlled  . Diabetes mellitus 11/10/2012    Past Surgical History  Procedure Date  . Circucision  2012   . Hernia repair     RIGHT  . Left foot surgery   . Vasectomy  2012   .    Marland Kitchen Left foot surgery   . Eus 11/08/2012    Procedure: UPPER ENDOSCOPIC ULTRASOUND (EUS) LINEAR;  Surgeon: Willis Modena, MD;  Location: [facility] ENDOSCOPY;  Service: Endoscopy;  Laterality: N/A;  . Fine needle aspiration 11/08/2012    Procedure: FINE NEEDLE ASPIRATION (FNA) LINEAR;  Surgeon: Willis Modena, MD;  Location: [facility] ENDOSCOPY;  Service: Endoscopy;  Laterality: N/A;  . Ercp 11/08/2012    Procedure: ENDOSCOPIC RETROGRADE CHOLANGIOPANCREATOGRAPHY (ERCP);  Surgeon: Petra Kuba, MD;  Location: [facility];  Service: Gastroenterology;  Laterality: N/A;  . Laparoscopy 12/12/2012    Procedure: LAPAROSCOPY DIAGNOSTIC;  Surgeon: Almond Lint, MD;  Location: [facility] ORS;  Service: General;  Laterality: N/A;  . Whipple procedure 12/12/2012    Procedure: WHIPPLE PROCEDURE;  Surgeon: Almond Lint, MD;  Location: [facility] ORS;  Service: General;  Laterality: N/A;    Family History  Problem Relation Age of Onset  . Cancer Mother     esophageal carcinoma   no other family history of cancer.  Current outpatient prescriptions:feeding supplement (GLUCERNA SHAKE) LIQD, Take 237 mLs by mouth 3 (three) times daily between meals., Disp: 60 Can, Rfl: 3;  loratadine (CLARITIN) 10 MG  tablet, Take 10 mg by mouth daily as needed. For allergies, Disp: , Rfl: ;  metoCLOPramide (REGLAN) 5 MG tablet, Take 1 tablet (5 mg total) by mouth 3 (three) times daily., Disp: 90 tablet, Rfl: 1 metoprolol succinate (TOPROL-XL) 100 MG 24 hr tablet, Take 100 mg by mouth daily before breakfast. Take with or immediately following a meal., Disp: , Rfl: ;  traMADol (ULTRAM) 50 MG tablet, Take  1 tablet (50 mg total) by mouth every 6 (six) hours as needed., Disp: 60 tablet, Rfl: 2;  acetaminophen (TYLENOL) 500 MG tablet, Take 500 mg by mouth every 4 (four) hours as needed., Disp: , Rfl:  Docusate Sodium (DSS) 100 MG CAPS, Take 100 mg by mouth 2 (two) times daily as needed., Disp: , Rfl: ;  HYDROcodone-acetaminophen (NORCO) 5-325 MG per tablet, Take 1-2 tablets by mouth every 6 (six) hours as needed for pain., Disp: 10 tablet, Rfl: 1  Allergies: No Known Allergies  Social History: He lives in [place] with his family, he works in a [employer], he denies tobacco and drinks alcohol rarely. No transfusion history. No risk factor for HIV or hepatitis.  ROS:   Positives include: Intermittent fever and chills over the past year, 25 pound weight loss after starting metformin for diabetes, jaundice December 2013, dry skin, fatigue  A complete ROS was otherwise negative.  Physical Exam:  Blood pressure 132/77, pulse 61, temperature 97.6 F (36.4 C), temperature source Oral, resp. rate 18, height 5' 5.5" (1.664 m), weight 139 lb 8 oz (63.277 kg).  HEENT: Oropharynx without visible mass, neck without mass Lungs: Clear bilaterally Cardiac: Regular rate and rhythm Abdomen: No hepatomegaly, no apparent ascites, healed incision GU: Testes without mass  Vascular: No leg edema Lymph nodes: No supraclavicular, axillary, or inguinal nodes. There are 2 subcentimeter mobile right posterior cervical nodes Neurologic: Alert and oriented, the motor exam appears grossly intact Skin: No rash   LAB:  CA 19-9 on 11/11/2012-508.8  Radiology: As per history of present illness, CT the chest 11/10/2012-scattered subcentimeter neck base and mediastinal lymph nodes, none of which are pathologically enlarged.    Assessment/Plan:   1. 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 Obstructive jaundice  secondary to #1, status post placement of a bile duct stent on 11/08/2013  3. Post ERCP pancreatitis  4. Elevated CA 19-9 on 11/11/2012  5. Small posterior cervical lymph nodes   Disposition:   He has been diagnosed with stage I adenocarcinoma of the ampulla. We discussed the relatively good prognosis associated with ampullary cancer compared to adenocarcinoma the pancreas. There is no "standard" recommendation for adjuvant chemotherapy or radiation in patients with resected ampullary carcinoma. We consider adjuvant chemotherapy and radiation in patients with more advanced disease. I do not recommend adjuvant therapy in his case.  The markedly elevated CA 19-9 was most likely related to the acute pancreatitis in December 2013. We checked a repeat CA 19-9  today. He will return for an office visit in 4 months.  Prisha Hiley 01/09/2013, 6:26 PM

## 2013-01-09 NOTE — Progress Notes (Signed)
Met with patient and daughter.  Explained role of nurse navigator.  Referral made to dietician for weight loss and education.  Patient denies need for other support services.  Contact names and phone numbers were provided.  Patient and daughter were appreciative and had no questions.

## 2013-01-09 NOTE — Progress Notes (Signed)
Checked in new pt with no financial concerns. °

## 2013-01-09 NOTE — Telephone Encounter (Signed)
gv and printed appt schedule for pt for Feb and June...pt aware...sent pt back to the lab

## 2013-01-10 ENCOUNTER — Encounter (INDEPENDENT_AMBULATORY_CARE_PROVIDER_SITE_OTHER): Payer: Self-pay | Admitting: General Surgery

## 2013-01-10 ENCOUNTER — Encounter: Payer: Self-pay | Admitting: *Deleted

## 2013-01-10 ENCOUNTER — Ambulatory Visit (INDEPENDENT_AMBULATORY_CARE_PROVIDER_SITE_OTHER): Payer: BC Managed Care – PPO | Admitting: General Surgery

## 2013-01-10 ENCOUNTER — Telehealth: Payer: Self-pay | Admitting: *Deleted

## 2013-01-10 VITALS — BP 118/78 | HR 96 | Temp 97.6°F | Resp 18 | Ht 65.0 in | Wt 137.4 lb

## 2013-01-10 DIAGNOSIS — C241 Malignant neoplasm of ampulla of Vater: Secondary | ICD-10-CM

## 2013-01-10 MED ORDER — PANCRELIPASE (LIP-PROT-AMYL) 24000-76000 UNITS PO CPEP: 1.0000 | ORAL_CAPSULE | Freq: Three times a day (TID) | ORAL | Status: DC

## 2013-01-10 NOTE — Telephone Encounter (Signed)
Message copied by Caleb Popp on Fri Jan 10, 2013 10:07 AM ------      Message from: Thornton Papas B      Created: Thu Jan 09, 2013 10:28 PM       Please call patient, ca19-9 is normal

## 2013-01-10 NOTE — Progress Notes (Signed)
HISTORY: Hector Pugh is now three-week status post pancreaticoduodenectomy for ampullary carcinoma. He has been doing reasonably well.  He has only lost 2 pounds in the past 2 weeks. His energy and he has significantly improved. He is not having any reflux. He is taking two 50 mg Tramadol pills per day.  He denies nausea and vomiting. His blood sugars have been around 100. He is having some loose stools. He saw Dr. Truett Perna yesterday and they've elected not to pursue chemoradiation.    EXAM: General:  Alert and oriented.  Good mobility Incision:  Well healed.  Blisters healing over.     PATHOLOGY: Diagnosis Whipple procedure/resection, and gallbladder - INVASIVE WELL DIFFERENTIATED AMPULLARY CARCINOMA, SPANNING 2.2 CM IN GREATEST DIMENSION. - TUMOR LIMITED TO AMPULLA OF VATER WITH INVASION OF AMPULLARY MUSCLE WITHOUT INVOLVEMENT OF DUODENUM. - DEFINITIVE LYMPH/VASCULAR INVASION IS NOT IDENTIFIED. - MARGINS ARE NEGATIVE. - ELEVEN LYMPH NODES WITH NO TUMOR SEEN (0/11). - SEE ONCOLOGY TEMPLATE.   ASSESSMENT AND PLAN:   #1 Ampullary carcinoma Post op.  No evidence of surgical complications. OK to go back to work in 3 weeks.  #2 Pancreatic exocrine insufficiency panc enzymes.  Follow up in 2 months.   Maudry Diego, MD Surgical Oncology, General & Endocrine Surgery [facility]  Karle Plumber, MD Karle Plumber, MD

## 2013-01-10 NOTE — Patient Instructions (Signed)
Eat more!  Ok to go back to work in 3 more weeks.  Continue walking.  Take pancreatic enzymes 1 with meals three times per day.  Follow up in 2 months.

## 2013-01-18 ENCOUNTER — Other Ambulatory Visit: Payer: Self-pay

## 2013-01-29 ENCOUNTER — Ambulatory Visit: Payer: BC Managed Care – PPO | Admitting: Nutrition

## 2013-01-29 NOTE — Progress Notes (Signed)
Patient is a 52 year old male diagnosed with ampullary cancer status post pancreaticoduodenectomy. He is a patient of Dr. Truett Perna.  Past medical history includes hypertension, pancreatitis, biliary stricture, and diabetes.  Medications include Reglan, Creon, Ultram.  Labs were reviewed.  Height: 65 inches. Weight: 136.8 pounds February 26. Usual body weight 150 pounds December 2013. BMI: 22.76.  Patient verbalizes concern regarding weight loss. He is reporting abdominal pain and increased gas. He is very concerned that he continues to have pain at this point after his surgery. He is taking Creon 24,000 lipase units capsules with meals. He is not taking any Creon with snacks. Patient concerned that medications are contributing to increased pain. Patient states he chose not to take a Creon to see if that improved his pain however, it did not. Patient reports adequate glycemic control at this time.  Nutrition diagnosis: Unintended weight loss related to new diagnosis of ampullary cancer and associated treatments as evidenced by 9% weight loss from usual body weight.  Intervention: I educated patient on the importance of small frequent high-calorie, high-protein meals throughout the day. I have encouraged patient to increase Glucerna 3 times a day between meals. I have reviewed the importance of including high-protein foods with his meals. I have encouraged patient to contact physician regarding abdominal pain and gas. I've given him some information on Creon but encouraged him to discuss dosing with his physician. I provided patient with additional samples of Glucerna along with fact sheets. Teach back method used.  Monitoring, evaluation, goals: Patient will tolerate increased oral intake to minimize further weight loss and improve symptoms.  Next visit: Patient will contact me if he has questions.

## 2013-01-30 ENCOUNTER — Encounter (INDEPENDENT_AMBULATORY_CARE_PROVIDER_SITE_OTHER): Payer: Self-pay | Admitting: General Surgery

## 2013-01-30 ENCOUNTER — Ambulatory Visit (INDEPENDENT_AMBULATORY_CARE_PROVIDER_SITE_OTHER): Payer: BC Managed Care – PPO | Admitting: General Surgery

## 2013-01-30 ENCOUNTER — Encounter (INDEPENDENT_AMBULATORY_CARE_PROVIDER_SITE_OTHER): Payer: Self-pay

## 2013-01-30 VITALS — BP 124/76 | HR 84 | Resp 18 | Ht 65.0 in | Wt 136.0 lb

## 2013-01-30 DIAGNOSIS — C241 Malignant neoplasm of ampulla of Vater: Secondary | ICD-10-CM

## 2013-01-30 MED ORDER — PANCRELIPASE (LIP-PROT-AMYL) 36000-114000 UNITS PO CPEP: 1.0000 | ORAL_CAPSULE | Freq: Three times a day (TID) | ORAL | Status: DC | PRN

## 2013-01-31 NOTE — Progress Notes (Signed)
HISTORY: The patient has continued to improve his appetite and his energy level. He is not taking any analgesics. He does not have any emesis. He is driving. He denies diarrhea, fever, or chills. He has been talking to the dietitian and is taking his Creon.  He does still have some crampy pain and some occasional loose stools, so he is requesting an increase on his Creon.   EXAM: General:  Alert and oriented Incision:  Well healed.      ASSESSMENT AND PLAN:   Ampullary carcinoma Patient continues to improve. He is much stronger and is requesting to go back to work. We have given him a work note.  I will see him back in 3 months.      Maudry Diego, MD Surgical Oncology, General & Endocrine Surgery [facility]  Karle Plumber, MD Karle Plumber, MD

## 2013-01-31 NOTE — Assessment & Plan Note (Signed)
Patient continues to improve. He is much stronger and is requesting to go back to work. We have given him a work note.  I will see him back in 3 months.

## 2013-03-05 ENCOUNTER — Encounter (INDEPENDENT_AMBULATORY_CARE_PROVIDER_SITE_OTHER): Payer: Self-pay

## 2013-03-10 ENCOUNTER — Encounter (INDEPENDENT_AMBULATORY_CARE_PROVIDER_SITE_OTHER): Payer: BC Managed Care – PPO | Admitting: General Surgery

## 2013-03-10 ENCOUNTER — Encounter (INDEPENDENT_AMBULATORY_CARE_PROVIDER_SITE_OTHER): Payer: Self-pay | Admitting: General Surgery

## 2013-03-10 ENCOUNTER — Ambulatory Visit (INDEPENDENT_AMBULATORY_CARE_PROVIDER_SITE_OTHER): Payer: BC Managed Care – PPO | Admitting: General Surgery

## 2013-03-10 VITALS — BP 106/74 | HR 60 | Temp 97.2°F | Ht 63.0 in | Wt 143.6 lb

## 2013-03-10 DIAGNOSIS — K8681 Exocrine pancreatic insufficiency: Secondary | ICD-10-CM

## 2013-03-10 DIAGNOSIS — K8689 Other specified diseases of pancreas: Secondary | ICD-10-CM

## 2013-03-10 DIAGNOSIS — C241 Malignant neoplasm of ampulla of Vater: Secondary | ICD-10-CM

## 2013-03-10 NOTE — Patient Instructions (Signed)
Try simethicone 1-2 x daily.  Try 24K creon cap with small meals and snacks and 36k creon cap with larger or richer meals.  I wrote script for 12 K creon for when those are out.  Can try lumbar belt for support.

## 2013-03-11 DIAGNOSIS — K8681 Exocrine pancreatic insufficiency: Secondary | ICD-10-CM | POA: Insufficient documentation

## 2013-03-11 NOTE — Assessment & Plan Note (Signed)
Just Creon so the patient is taking smaller dose with lighter meals and higher dose with heavier meals.  Advised patient to take simethicone.

## 2013-03-11 NOTE — Progress Notes (Signed)
HISTORY: Patient is now right at 3 months status post Whipple for ampullary carcinoma. He did very well postoperatively. He does have some pancreatic exocrine insufficiency and some hyperglycemia. Hyperglycemia is generally being managed with diet.  He is not having to take insulin.  He does have some numbness and sensitivity of his incision. He has gone back to work. He denies fevers and chills. He has not had any itching or jaundice.  EXAM: General:  Alert and oriented. Incision:  Well healed.     PATHOLOGY: Whipple procedure/resection, and gallbladder - INVASIVE WELL DIFFERENTIATED AMPULLARY CARCINOMA, SPANNING 2.2 CM IN GREATEST DIMENSION. - TUMOR LIMITED TO AMPULLA OF VATER WITH INVASION OF AMPULLARY MUSCLE WITHOUT INVOLVEMENT OF DUODENUM. - DEFINITIVE LYMPH/VASCULAR INVASION IS NOT IDENTIFIED. - MARGINS ARE NEGATIVE. - ELEVEN LYMPH NODES WITH NO TUMOR SEEN (0/11). - SEE ONCOLOGY TEMPLATE.   ASSESSMENT AND PLAN:   Exocrine pancreatic insufficiency Just Creon so the patient is taking smaller dose with lighter meals and higher dose with heavier meals.  Advised patient to take simethicone.  Ampullary carcinoma Patient is now 3 months postop. We'll recheck CA 19-9.  Patient a lower risk of recurrence given final pathology.  He has follow up with Dr. Truett Perna arranged.      Maudry Diego, MD Surgical Oncology, General & Endocrine Surgery [facility]  Karle Plumber, MD Karle Plumber, MD

## 2013-03-11 NOTE — Assessment & Plan Note (Signed)
Patient is now 3 months postop. We'll recheck CA 19-9.  Patient a lower risk of recurrence given final pathology.  He has follow up with Dr. Truett Perna arranged.

## 2013-05-08 ENCOUNTER — Telehealth: Payer: Self-pay | Admitting: Oncology

## 2013-05-08 ENCOUNTER — Ambulatory Visit (HOSPITAL_BASED_OUTPATIENT_CLINIC_OR_DEPARTMENT_OTHER): Payer: BC Managed Care – PPO | Admitting: Oncology

## 2013-05-08 ENCOUNTER — Ambulatory Visit (HOSPITAL_BASED_OUTPATIENT_CLINIC_OR_DEPARTMENT_OTHER): Payer: BC Managed Care – PPO | Admitting: Lab

## 2013-05-08 VITALS — BP 138/87 | HR 61 | Temp 97.5°F | Resp 18 | Ht 63.0 in | Wt 150.7 lb

## 2013-05-08 DIAGNOSIS — R142 Eructation: Secondary | ICD-10-CM

## 2013-05-08 DIAGNOSIS — R141 Gas pain: Secondary | ICD-10-CM

## 2013-05-08 DIAGNOSIS — C241 Malignant neoplasm of ampulla of Vater: Secondary | ICD-10-CM

## 2013-05-08 NOTE — Telephone Encounter (Signed)
Gave pt appt for lab and MD for December 2014 °

## 2013-05-08 NOTE — Progress Notes (Signed)
   [facility]    OFFICE PROGRESS NOTE   INTERVAL HISTORY:   He returns as scheduled. He feels well. Good appetite and energy level. He is working. He has "gas "discomfort after eating. No diarrhea. He is taking pancreas. Intermittent discomfort in the abdomen.  Objective:  Vital signs in last 24 hours:  Blood pressure 138/87, pulse 61, temperature 97.5 F (36.4 C), temperature source Oral, resp. rate 18, height 5\' 3"  (1.6 m), weight 150 lb 11.2 oz (68.357 kg).    HEENT: Neck without mass Lymphatics: No cervical, supraclavicular, axillary, or inguinal nodes Resp: Lungs clear bilaterally Cardio: Regular rate and rhythm GI: No hepatomegaly, no mass, no apparent ascites, mild tenderness surrounding the upper abdominal scar. Vascular: No leg edema   Lab Results:  CA 19-9 on 03/10/2013-41.1   Medications: I have reviewed the patient's current medications.  Assessment/Plan: 1. 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 Obstructive jaundice secondary to #1, status post placement of a bile duct stent on 11/08/2013  3. Post ERCP pancreatitis  4. Elevated CA 19-9 on 11/11/2012, mildly elevated on 03/10/2013  5. Small posterior cervical lymph nodes on exam to 6 2014 6. Intermittent "gas "and abdominal pain-likely related to the Whipple procedure  Disposition:  He appears to be in clinical remission from the ampullary carcinoma. The CA 19-9 was mildly elevated when he saw Dr. Donell Beers in April. We will repeat a CA 19-9 today. He reports being scheduled for a followup with Dr. Donell Beers and restaging CT in August. He will return for an office visit here in 6 months.   Thornton Papas, MD  05/08/2013  8:49 AM

## 2013-05-12 ENCOUNTER — Other Ambulatory Visit: Payer: Self-pay | Admitting: *Deleted

## 2013-05-12 ENCOUNTER — Encounter: Payer: Self-pay | Admitting: *Deleted

## 2013-05-12 DIAGNOSIS — C241 Malignant neoplasm of ampulla of Vater: Secondary | ICD-10-CM

## 2013-06-17 ENCOUNTER — Telehealth (INDEPENDENT_AMBULATORY_CARE_PROVIDER_SITE_OTHER): Payer: Self-pay | Admitting: General Surgery

## 2013-06-17 NOTE — Telephone Encounter (Signed)
Pt called in stating that they were supposed to have a f/u appt w/ Dr. Donell Beers in July or August as well as a CT scan.  I made the appt for 07/08/13 at 2:45 and I explained that I did not see anything about a CT scan in Dr. Arita Miss note but that I would send her a message on whether or not he would need the CT scan before, after, or at all for the appt. Informed them that we would let them know about the CT scan when we heard from Dr. Donell Beers.

## 2013-06-17 NOTE — Telephone Encounter (Signed)
Please get panc protocol ct before last visit.

## 2013-06-30 ENCOUNTER — Telehealth (INDEPENDENT_AMBULATORY_CARE_PROVIDER_SITE_OTHER): Payer: Self-pay

## 2013-06-30 NOTE — Telephone Encounter (Signed)
Pts daughter called wanting to know if Ct has been set up for her dad. He has upcoming appt in epic for OV with Dr Donell Beers. She states oncologist has deferred imaging and lab follow up to Dr Donell Beers. I advised her I see notes in epic re: CT. I will forward this to Dr Gaylyn Cheers to see if she is working on this. Daughter states she makes all appts for pt and to please contact her at [phone].

## 2013-07-01 ENCOUNTER — Telehealth (INDEPENDENT_AMBULATORY_CARE_PROVIDER_SITE_OTHER): Payer: Self-pay

## 2013-07-01 ENCOUNTER — Other Ambulatory Visit (INDEPENDENT_AMBULATORY_CARE_PROVIDER_SITE_OTHER): Payer: Self-pay | Admitting: General Surgery

## 2013-07-01 ENCOUNTER — Telehealth (INDEPENDENT_AMBULATORY_CARE_PROVIDER_SITE_OTHER): Payer: Self-pay | Admitting: General Surgery

## 2013-07-01 DIAGNOSIS — C241 Malignant neoplasm of ampulla of Vater: Secondary | ICD-10-CM

## 2013-07-01 NOTE — Telephone Encounter (Signed)
LMOM for patient's daughter to call back and ask for triage to made her aware of patient's CT abdomen pelvis appt scheduled at [facility] - [address] [place] location on Thursday 07/03/2013 to arrive at 8:00 am for 8:30 am appt. He will need to pick up contrast at this location. He will need to drink 1st bottle of contrast at 6:00 am and 2nd bottle at 7:00 am. He will not be able to eat any solid foods after 4:00 am. Awaiting call back to make them aware.

## 2013-07-01 NOTE — Telephone Encounter (Addendum)
LMOV for pt's daughter. CT instructions changed for pancreatic protocol.  Pt will still arrive at 8:00 a.m., on 7/31, but will not have to drink any oral contrast.  He will have to drink water at the facility prior to the scan.  Pt will still have to be NPO 4 hours.

## 2013-07-01 NOTE — Telephone Encounter (Signed)
LMOV to call our office so we can set up a CT scan per daughter's request prior to appt on 07/08/13.

## 2013-07-01 NOTE — Telephone Encounter (Signed)
Patient's daughter called back and was made aware of instructions below.

## 2013-07-03 ENCOUNTER — Ambulatory Visit
Admission: RE | Admit: 2013-07-03 | Discharge: 2013-07-03 | Disposition: A | Discharge status: Home / Self Care | Payer: BC Managed Care – PPO | Source: Ambulatory Visit | Attending: General Surgery | Admitting: General Surgery

## 2013-07-03 ENCOUNTER — Telehealth (INDEPENDENT_AMBULATORY_CARE_PROVIDER_SITE_OTHER): Payer: Self-pay

## 2013-07-03 ENCOUNTER — Other Ambulatory Visit: Payer: BC Managed Care – PPO

## 2013-07-03 ENCOUNTER — Encounter (INDEPENDENT_AMBULATORY_CARE_PROVIDER_SITE_OTHER): Payer: Self-pay | Admitting: General Surgery

## 2013-07-03 DIAGNOSIS — C241 Malignant neoplasm of ampulla of Vater: Secondary | ICD-10-CM

## 2013-07-03 NOTE — Progress Notes (Signed)
Quick Note:  Please let patient know that CT does NOT show any evidence of recurrent cancer ______

## 2013-07-03 NOTE — Telephone Encounter (Signed)
Pt's daughter notified CT scan showed no evidence of recurrent cancer.  She will notify the patient.

## 2013-07-08 ENCOUNTER — Encounter (INDEPENDENT_AMBULATORY_CARE_PROVIDER_SITE_OTHER): Payer: Self-pay | Admitting: General Surgery

## 2013-07-08 ENCOUNTER — Ambulatory Visit (INDEPENDENT_AMBULATORY_CARE_PROVIDER_SITE_OTHER): Payer: BC Managed Care – PPO | Admitting: General Surgery

## 2013-07-08 VITALS — BP 130/90 | HR 78 | Resp 16 | Ht 62.0 in | Wt 155.6 lb

## 2013-07-08 DIAGNOSIS — C241 Malignant neoplasm of ampulla of Vater: Secondary | ICD-10-CM

## 2013-07-08 DIAGNOSIS — K8681 Exocrine pancreatic insufficiency: Secondary | ICD-10-CM

## 2013-07-08 DIAGNOSIS — K8689 Other specified diseases of pancreas: Secondary | ICD-10-CM

## 2013-07-08 NOTE — Progress Notes (Signed)
HISTORY: Patient is a 52 year old male who is approximately 6-7 months status post Whipple for ampullary carcinoma. He did not receive any adjuvant treatment because he was stage I.  His weight has almost returned back to baseline. His energy level is good. 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  He denies any jaundice, fevers or chills, nausea or vomiting. He has had some right lower quadrant pain in the area of prior inguinal hernia incision. He is having some gas pain. He said he tried stopping his Creon but he had diarrhea.  He has been trying to eat healthier. He periodically checks his blood sugars and these have been mostly between 110-130 without medication, and while eating "real sugar."   PERTINENT REVIEW OF SYSTEMS: RLQ discomfort.    Filed Vitals:   07/08/13 1446  BP: 130/90  Pulse: 78  Resp: 16   Filed Weights   07/08/13 1446  Weight: 155 lb 9.6 oz (70.58 kg)     EXAM: Head: Normocephalic and atraumatic.  Eyes:  Conjunctivae are normal. Pupils are equal, round, and reactive to light. No scleral icterus.  Neck:  Normal range of motion. Neck supple. No tracheal deviation present. No thyromegaly present.  Resp: No respiratory distress, normal effort. Abd:  Abdomen is soft, non distended and non tender. No masses are palpable.  There is no rebound and no guarding. no hernia is palpable in groin or incision.   Neurological: Alert and oriented to person, place, and time. Coordination normal.  Skin: Skin is warm and dry. No rash noted. No diaphoretic. No erythema. No pallor.  Psychiatric: Normal mood and affect. Normal behavior. Judgment and thought content normal.      ASSESSMENT AND PLAN:   Ampullary carcinoma Patient has no clinical evidence of disease. He had stage I disease.  He has appointment with Dr. Truett Perna in 4 months. At that time, he will get CA 19-9 recheck.   Recent scan is negative for signs of recurrence.  I would not do additional scanning for at least 6 months unless there is a rise in his tumor marker.  Exocrine pancreatic insufficiency I advised the patient that I would continue the Creon if he gets diarrhea when he comes off of it.  I stated that he can try to come down on the dose if he desires to see if he is symptomatic.      Maudry Diego, MD Surgical Oncology, General & Endocrine Surgery [facility]  Karle Plumber, MD No ref. provider found

## 2013-07-08 NOTE — Assessment & Plan Note (Signed)
I advised the patient that I would continue the Creon if he gets diarrhea when he comes off of it.  I stated that he can try to come down on the dose if he desires to see if he is symptomatic.

## 2013-07-08 NOTE — Patient Instructions (Signed)
Follow up in 6 months 

## 2013-07-08 NOTE — Assessment & Plan Note (Signed)
Patient has no clinical evidence of disease. He had stage I disease.  He has appointment with Dr. Truett Perna in 4 months. At that time, he will get CA 19-9 recheck.   Recent scan is negative for signs of recurrence. I would not do additional scanning for at least 6 months unless there is a rise in his tumor marker.

## 2013-10-06 ENCOUNTER — Telehealth: Payer: Self-pay | Admitting: *Deleted

## 2013-10-06 ENCOUNTER — Ambulatory Visit (INDEPENDENT_AMBULATORY_CARE_PROVIDER_SITE_OTHER): Payer: BC Managed Care – PPO | Admitting: General Surgery

## 2013-10-06 VITALS — BP 122/80 | HR 80 | Temp 98.2°F | Resp 14 | Ht 62.0 in | Wt 158.8 lb

## 2013-10-06 DIAGNOSIS — K8689 Other specified diseases of pancreas: Secondary | ICD-10-CM

## 2013-10-06 DIAGNOSIS — K8681 Exocrine pancreatic insufficiency: Secondary | ICD-10-CM

## 2013-10-06 DIAGNOSIS — C241 Malignant neoplasm of ampulla of Vater: Secondary | ICD-10-CM

## 2013-10-06 NOTE — Assessment & Plan Note (Signed)
Pt's dose of creon has been stable and seems to control his stools.  He is not having any significant diarrhea and has maintained a stable weight.

## 2013-10-06 NOTE — Telephone Encounter (Signed)
Call from pt's daughter requesting orders for CT scan prior to office in Dec. She reports pt saw Dr. Donell Beers today and she mentioned Dr. Truett Perna may want to order CT prior to that visit to prevent pt having to see someone in again in Jan to review scan results.

## 2013-10-06 NOTE — Assessment & Plan Note (Addendum)
I feel that the hyperglycemia most likely is reflective of his more recent return to near normal diet and increased soda intake.  Also, he mainly had been checking his blood sugars in AM prior to any PO intake.    However, if his CA 19-9 is elevated again, it would not be unreasonable to repeat scans.   He has follow up in December with Dr. Truett Perna.

## 2013-10-06 NOTE — Progress Notes (Signed)
HISTORY: Pt is now around 10 months s/p whipple for ampullary carcinoma.  His CA 19-9 came down from 500 to 30, but increased to 41 post op.  It was stable at 41 on repeat.  His recent imaging in July/august was negative for signs of recurrence, though.  Also, he seems to be improving overall, rather than in decline.  He was a bit tired when he made the appt, but saw his primary provider for sinusitis.  This complaint has improved.   Pt is concerned that his hemaglobin A1C is increased again and is worried about possibility of cancer recurrence.  He was started on new oral antihyperglycemic, Invokana.   Mainly he is a little concerned because the hyperglycemia started around 6 months before his diagnosis of cancer.  This seemed to resolve post op, but was likely attributable to surgical weight loss.    He recently returned from vacation to [place].  He was visiting his family there.  Prior to going, he started drinking more regular sugar sodas.  He feels like the carbonation helps with his sense of reflux/heartburn.  Of note, he has come off his protonix.     PERTINENT REVIEW OF SYSTEMS: Otherwise negative x 11  Filed Vitals:   10/06/13 1438  BP: 122/80  Pulse: 80  Temp: 98.2 F (36.8 C)  Resp: 14   Filed Weights   10/06/13 1438  Weight: 158 lb 12.8 oz (72.031 kg)    EXAM: Head: Normocephalic and atraumatic.  Eyes:  Conjunctivae are normal. Pupils are equal, round, and reactive to light. No scleral icterus.  Neck:  Normal range of motion. Neck supple. No tracheal deviation present. No thyromegaly present.  Resp: No respiratory distress, normal effort. Abd:  Abdomen is soft, non distended and non tender. No masses are palpable.  There is no rebound and no guarding. no hernias are palpable.  The midline portion of the scar is slightly thicker compared to the lateral portion.   Neurological: Alert and oriented to person, place, and time. Coordination normal.  Skin: Skin is warm and dry.  No rash noted. No diaphoretic. No erythema. No pallor.  Psychiatric: Normal mood and affect. Normal behavior. Judgment and thought content normal.      ASSESSMENT AND PLAN:   Ampullary carcinoma  I feel that the hyperglycemia most likely is reflective of his more recent return to near normal diet and increased soda intake.  Also, he mainly had been checking his blood sugars in AM prior to any PO intake.    However, if his CA 19-9 is elevated again, it would not be unreasonable to repeat scans.   He has follow up in December with Dr. Truett Perna.     Exocrine pancreatic insufficiency Pt's dose of creon has been stable and seems to control his stools.  He is not having any significant diarrhea and has maintained a stable weight.        Maudry Diego, MD Surgical Oncology, General & Endocrine Surgery [facility]  Karle Plumber, MD Karle Plumber, MD

## 2013-10-09 ENCOUNTER — Other Ambulatory Visit: Payer: Self-pay

## 2013-10-14 ENCOUNTER — Telehealth: Payer: Self-pay | Admitting: *Deleted

## 2013-10-14 NOTE — Telephone Encounter (Signed)
PT.'S DAUGHTER WANTS TO KNOW IF DR.SHERRILL IS ORDERING A CT SCAN ON HER FATHER BEFORE PT.'S VISIT ON 11/07/13. THIS NOTE TO DR.SHERRILL'S NURSE, SUSAN COWARD,RN.

## 2013-10-17 ENCOUNTER — Telehealth: Payer: Self-pay | Admitting: *Deleted

## 2013-10-17 NOTE — Telephone Encounter (Signed)
Daughter had called again today requesting CT scan prior to December appointment with Dr. Truett Perna. Made her aware that he had received all her messages, but had not had opportunity to act on it yet. Will work on it next week and she will be contacted. Inquired if any certain day/time needs to be requested for her to accompany her, but she said no.

## 2013-10-20 ENCOUNTER — Other Ambulatory Visit: Payer: Self-pay | Admitting: Oncology

## 2013-10-20 DIAGNOSIS — C241 Malignant neoplasm of ampulla of Vater: Secondary | ICD-10-CM

## 2013-10-20 NOTE — Telephone Encounter (Signed)
Per Dr. Truett Perna: Plan was to check CA 19-9 and do scan based on that result. If pt wants scan sooner we can order. Spoke with daughter, they wish to have CT done. Scan has been scheduled for 10/28/13.

## 2013-10-28 ENCOUNTER — Encounter (HOSPITAL_COMMUNITY): Payer: Self-pay

## 2013-10-28 ENCOUNTER — Ambulatory Visit (HOSPITAL_COMMUNITY)
Admission: RE | Admit: 2013-10-28 | Discharge: 2013-10-28 | Disposition: A | Discharge status: Home / Self Care | Payer: BC Managed Care – PPO | Source: Ambulatory Visit | Attending: Oncology | Admitting: Oncology

## 2013-10-28 ENCOUNTER — Ambulatory Visit: Payer: BC Managed Care – PPO | Admitting: Lab

## 2013-10-28 DIAGNOSIS — Z9089 Acquired absence of other organs: Secondary | ICD-10-CM | POA: Insufficient documentation

## 2013-10-28 DIAGNOSIS — C241 Malignant neoplasm of ampulla of Vater: Secondary | ICD-10-CM

## 2013-10-28 LAB — CANCER ANTIGEN 19-9: CA 19-9: 39.7 U/mL — ABNORMAL HIGH (ref <35.0)

## 2013-10-28 LAB — POCT I-STAT CREATININE: Creatinine, Ser: 1.2 mg/dL (ref 0.50–1.35)

## 2013-10-28 MED ORDER — IOHEXOL 300 MG/ML  SOLN
50.0000 mL | Freq: Once | INTRAMUSCULAR | Status: AC | PRN
  Administered 2013-10-28: 50 mL via ORAL

## 2013-10-28 MED ORDER — IOHEXOL 300 MG/ML  SOLN
100.0000 mL | Freq: Once | INTRAMUSCULAR | Status: AC | PRN
  Administered 2013-10-28: 100 mL via INTRAVENOUS

## 2013-11-07 ENCOUNTER — Other Ambulatory Visit: Payer: BC Managed Care – PPO | Admitting: Lab

## 2013-11-07 ENCOUNTER — Ambulatory Visit (HOSPITAL_BASED_OUTPATIENT_CLINIC_OR_DEPARTMENT_OTHER): Payer: BC Managed Care – PPO | Admitting: Oncology

## 2013-11-07 ENCOUNTER — Telehealth: Payer: Self-pay | Admitting: Oncology

## 2013-11-07 VITALS — BP 153/84 | HR 67 | Temp 97.8°F | Resp 20 | Ht 62.0 in | Wt 159.5 lb

## 2013-11-07 DIAGNOSIS — K838 Other specified diseases of biliary tract: Secondary | ICD-10-CM

## 2013-11-07 DIAGNOSIS — R109 Unspecified abdominal pain: Secondary | ICD-10-CM

## 2013-11-07 DIAGNOSIS — C241 Malignant neoplasm of ampulla of Vater: Secondary | ICD-10-CM

## 2013-11-07 NOTE — Progress Notes (Signed)
   [facility]    OFFICE PROGRESS NOTE   INTERVAL HISTORY:   Mr. Schara returns for scheduled visit. He feels well. He takes Pancrease. No diarrhea. He recently changed diabetes medication. He reports intermittent upper abdominal discomfort. He describes this as "gas "pain.  Objective:  Vital signs in last 24 hours:  Blood pressure 153/84, pulse 67, temperature 97.8 F (36.6 C), temperature source Oral, resp. rate 20, height 5\' 2"  (1.575 m), weight 159 lb 8 oz (72.349 kg).    HEENT: Neck without mass Lymphatics: No cervical, supraclavicular, or inguinal nodes less than 1 cm soft mobile high right axillary node. Resp: Lungs clear bilaterally Cardio: Regular rate and rhythm GI: No hepatosplenomegaly, no mass, mild tenderness on deep palpation at the mid upper abdomen Vascular: No leg edema   Lab Results:  CA 19-9 on 10/28/2013-39.7  X-rays: CT of the abdomen and pelvis on 10/28/2013, compared to 07/03/2013: Mild nodularity at the root of the small bowel mesentery measuring up to 7 with nonspecific stranding, unchanged. The liver, spleen, and adrenal glands are normal.  Medications: I have reviewed the patient's current medications.  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 Obstructive jaundice secondary to #1, status post placement of a bile duct stent on 11/08/2013  3. Post ERCP pancreatitis  4. Elevated CA 19-9 on 11/11/2012, persistent mild elevation 5. Small posterior cervical lymph nodes on exam to 01/09/2013 6. Intermittent "gas "and abdominal pain-likely related to the Whipple procedure    Disposition:  He appears well. No clinical or x-ray evidence for progression of the ampullary carcinoma. The CA 19-9 remains mildly elevated. He will return for an office  visit and repeat CA 19-9 in 4 months.   Thornton Papas, MD  11/07/2013  9:36 AM

## 2013-11-07 NOTE — Telephone Encounter (Signed)
Gave pt appt for lab and MD  for April 2015 °

## 2014-01-22 ENCOUNTER — Telehealth (INDEPENDENT_AMBULATORY_CARE_PROVIDER_SITE_OTHER): Payer: Self-pay

## 2014-01-22 ENCOUNTER — Telehealth (INDEPENDENT_AMBULATORY_CARE_PROVIDER_SITE_OTHER): Payer: Self-pay | Admitting: General Surgery

## 2014-01-22 ENCOUNTER — Ambulatory Visit
Admission: RE | Admit: 2014-01-22 | Discharge: 2014-01-22 | Disposition: A | Discharge status: Home / Self Care | Payer: 59 | Source: Ambulatory Visit | Attending: General Surgery | Admitting: General Surgery

## 2014-01-22 ENCOUNTER — Other Ambulatory Visit (INDEPENDENT_AMBULATORY_CARE_PROVIDER_SITE_OTHER): Payer: Self-pay

## 2014-01-22 DIAGNOSIS — R109 Unspecified abdominal pain: Secondary | ICD-10-CM

## 2014-01-22 DIAGNOSIS — C241 Malignant neoplasm of ampulla of Vater: Secondary | ICD-10-CM

## 2014-01-22 LAB — CBC
HEMATOCRIT: 42.2 % (ref 39.0–52.0)
Hemoglobin: 14.1 g/dL (ref 13.0–17.0)
MCH: 27.4 pg (ref 26.0–34.0)
MCHC: 33.4 g/dL (ref 30.0–36.0)
MCV: 81.9 fL (ref 78.0–100.0)
Platelets: 177 10*3/uL (ref 150–400)
RBC: 5.15 MIL/uL (ref 4.22–5.81)
RDW: 16.2 % — ABNORMAL HIGH (ref 11.5–15.5)
WBC: 6.9 10*3/uL (ref 4.0–10.5)

## 2014-01-22 LAB — COMPREHENSIVE METABOLIC PANEL
ALT: 15 U/L (ref 0–53)
AST: 20 U/L (ref 0–37)
Albumin: 4.9 g/dL (ref 3.5–5.2)
Alkaline Phosphatase: 40 U/L (ref 39–117)
BUN: 12 mg/dL (ref 6–23)
CO2: 28 meq/L (ref 19–32)
CREATININE: 0.83 mg/dL (ref 0.50–1.35)
Calcium: 9.3 mg/dL (ref 8.4–10.5)
Chloride: 102 mEq/L (ref 96–112)
GLUCOSE: 155 mg/dL — AB (ref 70–99)
Potassium: 4.7 mEq/L (ref 3.5–5.3)
SODIUM: 137 meq/L (ref 135–145)
Total Bilirubin: 0.6 mg/dL (ref 0.2–1.2)
Total Protein: 7 g/dL (ref 6.0–8.3)

## 2014-01-22 NOTE — Telephone Encounter (Signed)
Labs and imaging scans ordered.

## 2014-01-22 NOTE — Telephone Encounter (Signed)
Pt called to report new onset pain for last two days.  He states the pain comes and goes, sometimes intense "like gas can be."  The pain is at the site of his surgery; had Whipple procedure on 12/12/12.  Pt reports he is having regular bowel movements.  Tylenol helps only a little.  Please advise if he needs to be seen and when.  Can call pt at either number, home or cell ([phone].)

## 2014-01-22 NOTE — Telephone Encounter (Signed)
Test are walk in at [facility] imaging, no appt needed. Also get labs at [facility] same time.

## 2014-01-22 NOTE — Progress Notes (Signed)
Quick Note:  Films look OK/ Please let pt know. ______

## 2014-01-23 ENCOUNTER — Other Ambulatory Visit (INDEPENDENT_AMBULATORY_CARE_PROVIDER_SITE_OTHER): Payer: Self-pay | Admitting: General Surgery

## 2014-01-23 DIAGNOSIS — C241 Malignant neoplasm of ampulla of Vater: Secondary | ICD-10-CM

## 2014-01-23 LAB — CANCER ANTIGEN 19-9: CA 19 9: 66.1 U/mL — AB (ref <35.0)

## 2014-01-23 NOTE — Progress Notes (Signed)
Quick Note:  Tumor marker is up a bit. Will send to Dr. Benay Spice. Please order CT chest/abd/ pelvis po/iv contrast. Ok to get it at [facility].  ______

## 2014-01-23 NOTE — Telephone Encounter (Signed)
Pt made aware his chest x-ray was normal. He says he is feeling better this morning.  He has not had a bowel movement though.  I instructed him to try Miralax and if he still has pain after a bowel movement he should call us.  I also told him his labs were okay.  He questioned his CA 19-9.  He saw his results on line and thinks the level is abnormal.  Message sent to Dr. Barry Dienes for review and will call the patient back if necessary.

## 2014-01-23 NOTE — Telephone Encounter (Addendum)
Notified pt that Dr. Barry Dienes does want a CT abd/pelvis w/ oral and IV contrast due to elevated CA 19-9.  Scheduled for 01/27/14 at [facility] with an 11:00 arrival to drink the contrast there.

## 2014-01-27 ENCOUNTER — Ambulatory Visit (HOSPITAL_COMMUNITY)
Admission: RE | Admit: 2014-01-27 | Discharge: 2014-01-27 | Disposition: A | Discharge status: Home / Self Care | Payer: 59 | Source: Ambulatory Visit | Attending: General Surgery | Admitting: General Surgery

## 2014-01-27 ENCOUNTER — Encounter (HOSPITAL_COMMUNITY): Payer: Self-pay

## 2014-01-27 DIAGNOSIS — Z90410 Acquired total absence of pancreas: Secondary | ICD-10-CM | POA: Insufficient documentation

## 2014-01-27 DIAGNOSIS — C241 Malignant neoplasm of ampulla of Vater: Secondary | ICD-10-CM | POA: Insufficient documentation

## 2014-01-27 MED ORDER — IOHEXOL 300 MG/ML  SOLN
50.0000 mL | Freq: Once | INTRAMUSCULAR | Status: AC | PRN
  Administered 2014-01-27: 50 mL via ORAL

## 2014-01-27 MED ORDER — IOHEXOL 300 MG/ML  SOLN
100.0000 mL | Freq: Once | INTRAMUSCULAR | Status: AC | PRN
  Administered 2014-01-27: 100 mL via INTRAVENOUS

## 2014-01-28 ENCOUNTER — Telehealth (INDEPENDENT_AMBULATORY_CARE_PROVIDER_SITE_OTHER): Payer: Self-pay

## 2014-01-28 NOTE — Telephone Encounter (Signed)
Informed pt's daughter of normal CT results.  Pt does have some constipation though.  She will start him on Miralax today to see if that helps.  She states that he has become a lot weaker and does have trouble walking at times.  I asked her to let us know if the pain does not resolve.

## 2014-01-28 NOTE — Telephone Encounter (Signed)
Puja pts daughter calling for Ct result done yesterday. I advised her this request will be sent to Dr Barry Dienes and her assistant to review and return call. Call [phone] or [phone] with results.

## 2014-01-29 ENCOUNTER — Other Ambulatory Visit: Payer: Self-pay | Admitting: Oncology

## 2014-01-29 DIAGNOSIS — C241 Malignant neoplasm of ampulla of Vater: Secondary | ICD-10-CM

## 2014-02-02 ENCOUNTER — Other Ambulatory Visit (INDEPENDENT_AMBULATORY_CARE_PROVIDER_SITE_OTHER): Payer: Self-pay | Admitting: General Surgery

## 2014-02-02 ENCOUNTER — Telehealth (INDEPENDENT_AMBULATORY_CARE_PROVIDER_SITE_OTHER): Payer: Self-pay

## 2014-02-02 NOTE — Telephone Encounter (Signed)
Pt made aware that his refill for CREON has been approved and sent to his pharmacy.  Also, he may come p/u some samples which will be at our front desk.

## 2014-02-12 ENCOUNTER — Other Ambulatory Visit: Payer: Self-pay | Admitting: *Deleted

## 2014-03-13 ENCOUNTER — Other Ambulatory Visit: Payer: BC Managed Care – PPO

## 2014-03-13 ENCOUNTER — Ambulatory Visit: Payer: BC Managed Care – PPO | Admitting: Oncology

## 2014-04-14 ENCOUNTER — Ambulatory Visit: Payer: 59 | Admitting: Oncology

## 2014-04-14 ENCOUNTER — Other Ambulatory Visit: Payer: 59

## 2014-05-05 ENCOUNTER — Other Ambulatory Visit (INDEPENDENT_AMBULATORY_CARE_PROVIDER_SITE_OTHER): Payer: Self-pay | Admitting: General Surgery

## 2014-05-06 ENCOUNTER — Telehealth (INDEPENDENT_AMBULATORY_CARE_PROVIDER_SITE_OTHER): Payer: Self-pay

## 2014-05-06 NOTE — Telephone Encounter (Signed)
Pt was calling to see if Dr Barry Dienes had received a refill request from his pharmacy for his Creon 12000U. Informed pt that Jearld Fenton has received this and she will get in touch with Dr Barry Dienes to have his Creon refilled. Informed pt and he verbalized understanding.

## 2014-05-06 NOTE — Telephone Encounter (Signed)
Ok to refill x 11  

## 2014-05-06 NOTE — Telephone Encounter (Deleted)
Pt was calling to see if Dr Byerly had received a refill request from his pharmacy for his Creon 12000U. Informed pt that Bernie has received this and she will get in touch with Dr Byerly to have his Creon refilled. Informed pt and he verbalized understanding.  

## 2014-05-06 NOTE — Telephone Encounter (Signed)
Electronically refilled today.

## 2014-05-11 ENCOUNTER — Ambulatory Visit (HOSPITAL_BASED_OUTPATIENT_CLINIC_OR_DEPARTMENT_OTHER): Payer: 59 | Admitting: Oncology

## 2014-05-11 ENCOUNTER — Other Ambulatory Visit (HOSPITAL_BASED_OUTPATIENT_CLINIC_OR_DEPARTMENT_OTHER): Payer: 59

## 2014-05-11 ENCOUNTER — Telehealth: Payer: Self-pay | Admitting: Oncology

## 2014-05-11 ENCOUNTER — Telehealth: Payer: Self-pay | Admitting: *Deleted

## 2014-05-11 VITALS — BP 140/78 | HR 53 | Temp 97.7°F | Resp 18 | Ht 62.0 in | Wt 156.8 lb

## 2014-05-11 DIAGNOSIS — C241 Malignant neoplasm of ampulla of Vater: Secondary | ICD-10-CM

## 2014-05-11 LAB — CANCER ANTIGEN 19-9: CA 19-9: 41.5 U/mL — ABNORMAL HIGH (ref <35.0)

## 2014-05-11 NOTE — Telephone Encounter (Signed)
Notified of CA 19-9 results and to follow up with Dr. Barry Dienes in 2-3 months. He said he will call office to make the appointment.

## 2014-05-11 NOTE — Telephone Encounter (Signed)
Gave pt appt for Nov lab and MD

## 2014-05-11 NOTE — Telephone Encounter (Signed)
Message copied by Tania Ade on Mon May 11, 2014  4:21 PM ------      Message from: Betsy Coder B      Created: Mon May 11, 2014  3:07 PM       Please call patient, ca19-9 is better, f/u as scheduled, see Dr. Barry Dienes in 2-3 months ------

## 2014-05-11 NOTE — Progress Notes (Signed)
  [facility] OFFICE PROGRESS NOTE   Diagnosis: Ampullary carcinoma  INTERVAL HISTORY:   Mr. Gover returns as scheduled. He feels well. Good appetite. No diarrhea. Occasional pain at the midline scar. No consistent pain.  Objective:  Vital signs in last 24 hours:  Blood pressure 140/78, pulse 53, temperature 97.7 F (36.5 C), temperature source Oral, resp. rate 18, height 5\' 2"  (1.575 m), weight 156 lb 12.8 oz (71.124 kg).    HEENT: Neck without mass Lymphatics: Pea-sized bilateral scalene node. No other cervical, supraclavicular, axillary, or inguinal nodes Resp: Lungs clear bilaterally Cardio: Regular rate and rhythm GI: No hepatosplenomegaly, nontender, no mass Vascular: No leg edema   Lab Results:  CA 19-9 on 01/22/2014 -66.1  Imaging: CT of the abdomen and pelvis 01/27/2014-no evidence of locally recurrent or metastatic disease  Medications: I have reviewed the patient's current medications.  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 Obstructive jaundice secondary to #1, status post placement of a bile duct stent on 11/08/2013  3. Post ERCP pancreatitis  4. Elevated CA 19-9 on 11/11/2012, persistent mild elevation  5. Small posterior cervical lymph nodes on exam to 01/09/2013    Disposition:  He appears to be in clinical remission from ampullary carcinoma. We will followup on the CA 19-9 from today. He will return for an office visit and CA 19-9 in approximately 5 months. He plans to see Dr. Barry Dienes in the interim.  Ladell Pier, MD  05/11/2014  10:12 AM

## 2014-05-14 ENCOUNTER — Ambulatory Visit: Payer: 59 | Admitting: Oncology

## 2014-05-18 ENCOUNTER — Ambulatory Visit (INDEPENDENT_AMBULATORY_CARE_PROVIDER_SITE_OTHER): Payer: 59 | Admitting: General Surgery

## 2014-06-01 ENCOUNTER — Ambulatory Visit (INDEPENDENT_AMBULATORY_CARE_PROVIDER_SITE_OTHER): Payer: 59 | Admitting: General Surgery

## 2014-06-03 ENCOUNTER — Encounter (INDEPENDENT_AMBULATORY_CARE_PROVIDER_SITE_OTHER): Payer: Self-pay | Admitting: General Surgery

## 2014-06-03 ENCOUNTER — Ambulatory Visit (INDEPENDENT_AMBULATORY_CARE_PROVIDER_SITE_OTHER): Payer: 59 | Admitting: General Surgery

## 2014-06-03 VITALS — BP 160/100 | HR 61 | Temp 97.9°F | Resp 14 | Ht 62.0 in | Wt 156.0 lb

## 2014-06-03 DIAGNOSIS — K8681 Exocrine pancreatic insufficiency: Secondary | ICD-10-CM

## 2014-06-03 DIAGNOSIS — C241 Malignant neoplasm of ampulla of Vater: Secondary | ICD-10-CM

## 2014-06-03 DIAGNOSIS — K8689 Other specified diseases of pancreas: Secondary | ICD-10-CM

## 2014-06-03 NOTE — Progress Notes (Signed)
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  He has had some sl elevated levels of CA 19-9, but these have stayed back down.  He is doing well overall.  He just started a new job.  He continues to have some gas discomfort.  His PCP thinks it may be related to the metformin.  He is taking simethicone PRN.  He is on creon, and this keeps his stools mostly normal to slightly soft in consistency.  He is not having any pain, but still gets a little discomfort if he tries to lift something very heavy.     PERTINENT REVIEW OF SYSTEMS: Otherwise negative x 11  Filed Vitals:   06/03/14 0954  BP: 160/100  Pulse: 61  Temp: 97.9 F (36.6 C)  Resp: 14   Filed Weights   06/03/14 0954  Weight: 156 lb (70.761 kg)    EXAM: No change Head: Normocephalic and atraumatic.  Eyes:  Conjunctivae are normal. Pupils are equal, round, and reactive to light. No scleral icterus.  Neck:  Normal range of motion. Neck supple. No tracheal deviation present. No thyromegaly present.  Resp: No respiratory distress, normal effort. Abd:  Abdomen is soft, non distended and non tender. No masses are palpable.  There is no rebound and no guarding. No hernias. Some keloid of midline portion of scar.  No hepatosplenomegaly.   Neurological: Alert and oriented to person, place, and time. Coordination normal.  Skin: Skin is warm and dry. No rash noted. No diaphoretic. No erythema. No pallor.  Psychiatric: Normal mood and affect. Normal behavior. Judgment and thought content normal.      ASSESSMENT AND PLAN:   Exocrine pancreatic insufficiency Continue creon. Went to lower dose at last visit.   Refilled recently  Ampullary carcinoma CA 19-9 came down.  Dr. Benay Spice following.   I will see in January 2016.    At that point, will move to annual follow up.  No clinical evidence of disease.         Milus Height, MD Surgical Oncology, [facility]  [name], MD No ref. provider found

## 2014-06-03 NOTE — Assessment & Plan Note (Signed)
Continue creon. Went to lower dose at last visit.   Refilled recently

## 2014-06-03 NOTE — Assessment & Plan Note (Signed)
CA 19-9 came down.  Dr. Benay Spice following.   I will see in January 2016.    At that point, will move to annual follow up.  No clinical evidence of disease.

## 2014-06-03 NOTE — Patient Instructions (Signed)
Follow up in January 2016.  Continue creon.

## 2014-06-10 ENCOUNTER — Telehealth (INDEPENDENT_AMBULATORY_CARE_PROVIDER_SITE_OTHER): Payer: Self-pay | Admitting: General Surgery

## 2014-06-10 ENCOUNTER — Inpatient Hospital Stay (HOSPITAL_BASED_OUTPATIENT_CLINIC_OR_DEPARTMENT_OTHER)
Admission: EM | Admit: 2014-06-10 | Discharge: 2014-06-13 | DRG: 440 | Disposition: A | Discharge status: Home / Self Care | Payer: 59 | Attending: Internal Medicine | Admitting: Internal Medicine

## 2014-06-10 ENCOUNTER — Emergency Department (HOSPITAL_BASED_OUTPATIENT_CLINIC_OR_DEPARTMENT_OTHER): Payer: 59

## 2014-06-10 ENCOUNTER — Encounter (HOSPITAL_BASED_OUTPATIENT_CLINIC_OR_DEPARTMENT_OTHER): Payer: Self-pay | Admitting: Emergency Medicine

## 2014-06-10 DIAGNOSIS — Z9852 Vasectomy status: Secondary | ICD-10-CM

## 2014-06-10 DIAGNOSIS — K859 Acute pancreatitis without necrosis or infection, unspecified: Principal | ICD-10-CM

## 2014-06-10 DIAGNOSIS — I10 Essential (primary) hypertension: Secondary | ICD-10-CM | POA: Diagnosis present

## 2014-06-10 DIAGNOSIS — Z8509 Personal history of malignant neoplasm of other digestive organs: Secondary | ICD-10-CM

## 2014-06-10 DIAGNOSIS — R51 Headache: Secondary | ICD-10-CM | POA: Diagnosis present

## 2014-06-10 DIAGNOSIS — Z79899 Other long term (current) drug therapy: Secondary | ICD-10-CM

## 2014-06-10 DIAGNOSIS — E119 Type 2 diabetes mellitus without complications: Secondary | ICD-10-CM | POA: Diagnosis present

## 2014-06-10 DIAGNOSIS — Z800 Family history of malignant neoplasm of digestive organs: Secondary | ICD-10-CM

## 2014-06-10 DIAGNOSIS — D696 Thrombocytopenia, unspecified: Secondary | ICD-10-CM | POA: Diagnosis present

## 2014-06-10 DIAGNOSIS — R519 Headache, unspecified: Secondary | ICD-10-CM | POA: Diagnosis present

## 2014-06-10 DIAGNOSIS — Z90411 Acquired partial absence of pancreas: Secondary | ICD-10-CM

## 2014-06-10 HISTORY — DX: Malignant (primary) neoplasm, unspecified: C80.1

## 2014-06-10 HISTORY — DX: Malignant neoplasm of pancreas, unspecified: C25.9

## 2014-06-10 LAB — CBC WITH DIFFERENTIAL/PLATELET
Basophils Absolute: 0 10*3/uL (ref 0.0–0.1)
Basophils Relative: 0 % (ref 0–1)
Eosinophils Absolute: 0.1 10*3/uL (ref 0.0–0.7)
Eosinophils Relative: 1 % (ref 0–5)
HCT: 41.8 % (ref 39.0–52.0)
Hemoglobin: 13.9 g/dL (ref 13.0–17.0)
Lymphocytes Relative: 11 % — ABNORMAL LOW (ref 12–46)
Lymphs Abs: 1.1 10*3/uL (ref 0.7–4.0)
MCH: 28.3 pg (ref 26.0–34.0)
MCHC: 33.3 g/dL (ref 30.0–36.0)
MCV: 85 fL (ref 78.0–100.0)
Monocytes Absolute: 0.4 10*3/uL (ref 0.1–1.0)
Monocytes Relative: 3 % (ref 3–12)
Neutro Abs: 8.9 10*3/uL — ABNORMAL HIGH (ref 1.7–7.7)
Neutrophils Relative %: 85 % — ABNORMAL HIGH (ref 43–77)
Platelets: 149 10*3/uL — ABNORMAL LOW (ref 150–400)
RBC: 4.92 MIL/uL (ref 4.22–5.81)
RDW: 13.1 % (ref 11.5–15.5)
WBC: 10.5 10*3/uL (ref 4.0–10.5)

## 2014-06-10 LAB — COMPREHENSIVE METABOLIC PANEL
ALK PHOS: 41 U/L (ref 39–117)
ALT: 15 U/L (ref 0–53)
ANION GAP: 13 (ref 5–15)
AST: 20 U/L (ref 0–37)
Albumin: 4.6 g/dL (ref 3.5–5.2)
BUN: 10 mg/dL (ref 6–23)
CO2: 25 mEq/L (ref 19–32)
CREATININE: 0.9 mg/dL (ref 0.50–1.35)
Calcium: 9.8 mg/dL (ref 8.4–10.5)
Chloride: 102 mEq/L (ref 96–112)
GFR calc Af Amer: >90 mL/min (ref >90)
GFR calc non Af Amer: >90 mL/min (ref >90)
Glucose, Bld: 146 mg/dL — ABNORMAL HIGH (ref 70–99)
Potassium: 4.2 mEq/L (ref 3.7–5.3)
Sodium: 140 mEq/L (ref 137–147)
TOTAL PROTEIN: 7.4 g/dL (ref 6.0–8.3)
Total Bilirubin: 0.5 mg/dL (ref 0.3–1.2)

## 2014-06-10 LAB — URINALYSIS, ROUTINE W REFLEX MICROSCOPIC
BILIRUBIN URINE: NEGATIVE
Glucose, UA: 100 mg/dL — AB
Hgb urine dipstick: NEGATIVE
Ketones, ur: NEGATIVE mg/dL
LEUKOCYTES UA: NEGATIVE
NITRITE: NEGATIVE
Protein, ur: NEGATIVE mg/dL
SPECIFIC GRAVITY, URINE: 1.014 (ref 1.005–1.030)
UROBILINOGEN UA: 0.2 mg/dL (ref 0.0–1.0)
pH: 8 (ref 5.0–8.0)

## 2014-06-10 LAB — LIPASE, BLOOD: Lipase: 1142 U/L — ABNORMAL HIGH (ref 11–59)

## 2014-06-10 MED ORDER — IOHEXOL 300 MG/ML  SOLN
50.0000 mL | Freq: Once | INTRAMUSCULAR | Status: AC | PRN
  Administered 2014-06-10: 50 mL via ORAL

## 2014-06-10 MED ORDER — SODIUM CHLORIDE 0.9 % IV BOLUS (SEPSIS)
1000.0000 mL | Freq: Once | INTRAVENOUS | Status: AC
  Administered 2014-06-10: 1000 mL via INTRAVENOUS

## 2014-06-10 MED ORDER — HYDROMORPHONE HCL PF 1 MG/ML IJ SOLN
0.5000 mg | Freq: Once | INTRAMUSCULAR | Status: AC
Start: 2014-06-10 — End: 2014-06-10
  Administered 2014-06-10: 0.5 mg via INTRAVENOUS
  Filled 2014-06-10: qty 1

## 2014-06-10 MED ORDER — IOHEXOL 300 MG/ML  SOLN
100.0000 mL | Freq: Once | INTRAMUSCULAR | Status: AC | PRN
  Administered 2014-06-10: 100 mL via INTRAVENOUS

## 2014-06-10 NOTE — ED Notes (Signed)
C/o abs pain started 12 pm today-denies n/v-one episode diarrhea

## 2014-06-10 NOTE — ED Notes (Signed)
MD at bedside discussing test results and plan of care.

## 2014-06-10 NOTE — ED Notes (Signed)
MD notified of pt's temp.

## 2014-06-10 NOTE — Telephone Encounter (Signed)
Patient called stating he was having worsening of Domino pain. He denies any nausea or vomiting. He is tolerating a diet as well as liquids. He is having regular bowel movements although they have been a little more loose than normal. He denies any fevers. Given that it is after hours, I told him that he could be evaluated in the emergency department if he felt like the pain was continuing to get worse. Otherwise, I would have Dr. Marlowe Aschoff nurse talk to him in the morning.

## 2014-06-10 NOTE — ED Provider Notes (Signed)
CSN: [id number]     Arrival date & time 06/10/14  2011 History  This chart was scribed for Houston Siren III, * by Vernell Barrier, ED scribe. This patient was seen in room MH06/MH06 and the patient's care was started at 8:40 PM.    Chief Complaint  Patient presents with  . Abdominal Pain    Patient is a 53 y.o. male presenting with abdominal pain. The history is provided by the patient and a relative. No language interpreter was used.  Abdominal Pain Pain location:  LUQ Pain quality: aching   Pain radiates to:  Does not radiate Onset quality:  Gradual Duration:  9 hours Timing:  Constant Progression:  Worsening Chronicity:  New Relieved by:  Nothing Worsened by:  Movement, eating, not moving and position changes Ineffective treatments:  Antacids (or Narcotic) Associated symptoms: diarrhea   Associated symptoms: no fever, no nausea and no vomiting   Diarrhea:    Number of occurrences:  1   Severity:  Mild   Timing:  Rare   Progression:  Resolved Risk factors: multiple surgeries    HPI Comments: Hector Pugh is a 53 y.o. male who  presents to the Emergency Department complaining of constant severe LUQ aching abdominal pain; onset 9 hours ago. Rates pain 7/10. Reports 1 episode of diarrhea 7 hours ago. Took 1 Tramadol approximately 6 hours ago with no relief. Thought pain was attributed to regular occuring gas pains so took antacid and state this provided no relief either. States current pain is different from gas pain. Left work early due to pain and called surgeon who instructed him to come to the ED. Whipple procedure 1.5 years ago by Dr. Barry Dienes. Experiences intermittent abdominal pain due to this but states current pain is more severe. Hx of Acute pancreatis from ERCP. Denies nausea, vomting, fever, and back pain.  Past Medical History  Diagnosis Date  . Hypertension   . HTN (hypertension) 11/10/2012  . Pancreatitis 11/10/2012  . Obstructive jaundice 11/11/2012    With  ampullary mass  . Biliary stricture 11/11/2012    S/p biliary stent 11/08/12  . Seasonal allergies   . Diabetes mellitus without complication     diet controlled  . Diabetes mellitus 11/10/2012   Past Surgical History  Procedure Laterality Date  . Circucision    . Hernia repair      RIGHT  . Left foot surgery    . Vasectomy    . Circumsision    . Left foot surgery    . Eus  11/08/2012    Procedure: UPPER ENDOSCOPIC ULTRASOUND (EUS) LINEAR;  Surgeon: Arta Silence, MD;  Location: [facility];  Service: Endoscopy;  Laterality: N/A;  . Fine needle aspiration  11/08/2012    Procedure: FINE NEEDLE ASPIRATION (FNA) LINEAR;  Surgeon: Arta Silence, MD;  Location: [facility];  Service: Endoscopy;  Laterality: N/A;  . Ercp  11/08/2012    Procedure: ENDOSCOPIC RETROGRADE CHOLANGIOPANCREATOGRAPHY (ERCP);  Surgeon: Jeryl Columbia, MD;  Location: [facility];  Service: Gastroenterology;  Laterality: N/A;  . Laparoscopy  12/12/2012    Procedure: LAPAROSCOPY DIAGNOSTIC;  Surgeon: Stark Klein, MD;  Location: [facility];  Service: General;  Laterality: N/A;  . Whipple procedure  12/12/2012    Procedure: WHIPPLE PROCEDURE;  Surgeon: Stark Klein, MD;  Location: [facility];  Service: General;  Laterality: N/A;   Family History  Problem Relation Age of Onset  . Cancer Mother     esophageal carcinoma   History  Substance Use  Topics  . Smoking status: Never Smoker   . Smokeless tobacco: Never Used  . Alcohol Use: No    Review of Systems  Constitutional: Negative for fever.  Gastrointestinal: Positive for abdominal pain and diarrhea. Negative for nausea and vomiting.  Musculoskeletal: Negative for back pain.  All other systems reviewed and are negative.  Allergies  Review of patient's allergies indicates no known allergies.  Home Medications   Prior to Admission medications   Medication Sig Start Date End Date Taking? Authorizing Provider  acetaminophen (TYLENOL) 500 MG tablet Take 500 mg by mouth  every 4 (four) hours as needed.    Historical Provider, MD  CREON 12000 UNITS CPEP capsule take 2 capsules wuth small meals and snacks; take 3-4 capsules with large/fatty meals    Stark Klein, MD  Docusate Sodium (DSS) 100 MG CAPS Take 100 mg by mouth 2 (two) times daily as needed. 12/23/12   Stark Klein, MD  feeding supplement (GLUCERNA SHAKE) LIQD Take 237 mLs by mouth 3 (three) times daily between meals. 12/23/12   Stark Klein, MD  metFORMIN (GLUCOPHAGE) 500 MG tablet Take 500 mg by mouth 2 (two) times daily. 11/17/13   Historical Provider, MD  metoprolol succinate (TOPROL-XL) 100 MG 24 hr tablet Take 100 mg by mouth daily before breakfast. Take with or immediately following a meal.    Historical Provider, MD  ONETOUCH VERIO test strip  01/28/13   Historical Provider, MD  traMADol (ULTRAM) 50 MG tablet Take 1 tablet (50 mg total) by mouth every 6 (six) hours as needed. 12/23/12   Stark Klein, MD   Triage vitals: BP 178/95  Pulse 90  Temp(Src) 98.5 F (36.9 C) (Oral)  Resp 18  Ht 5\' 2"  (1.575 m)  Wt 153 lb (69.4 kg)  BMI 27.98 kg/m2  SpO2 98%  Physical Exam  Nursing note and vitals reviewed. Constitutional: He is oriented to person, place, and time. He appears well-developed and well-nourished. No distress.  HENT:  Head: Normocephalic and atraumatic.  Mouth/Throat: Oropharynx is clear and moist.  Eyes: Conjunctivae are normal. Pupils are equal, round, and reactive to light. No scleral icterus.  Neck: Neck supple.  Cardiovascular: Normal rate, regular rhythm, normal heart sounds and intact distal pulses.   No murmur heard. Pulmonary/Chest: Effort normal and breath sounds normal. No stridor. No respiratory distress. He has no wheezes. He has no rales.  Abdominal: Soft. He exhibits no distension. There is tenderness.  LUQ and epigastric tenderness to palpation  Musculoskeletal: Normal range of motion. He exhibits no edema.  Neurological: He is alert and oriented to person, place, and  time.  Skin: Skin is warm and dry. No rash noted.  Psychiatric: He has a normal mood and affect. His behavior is normal.    ED Course  Procedures (including critical care time) DIAGNOSTIC STUDIES: Oxygen Saturation is 98% on room air, normal by my interpretation.    COORDINATION OF CARE: At 8:51 PM: Discussed treatment plan with patient which includes IV fluids and lab work. Patient agrees.   Labs Review Labs Reviewed  CBC WITH DIFFERENTIAL - Abnormal; Notable for the following:    Platelets 149 (*)    Neutrophils Relative % 85 (*)    Neutro Abs 8.9 (*)    Lymphocytes Relative 11 (*)    All other components within normal limits  COMPREHENSIVE METABOLIC PANEL - Abnormal; Notable for the following:    Glucose, Bld 146 (*)    All other components within normal limits  URINALYSIS,  ROUTINE W REFLEX MICROSCOPIC - Abnormal; Notable for the following:    Glucose, UA 100 (*)    All other components within normal limits  LIPASE, BLOOD - Abnormal; Notable for the following:    Lipase 1142 (*)    All other components within normal limits    Imaging Review Ct Abdomen Pelvis W Contrast  06/10/2014   CLINICAL DATA:  Left upper quadrant pain. Diarrhea. Pancreatitis. Previous Whipple procedure for ampullary carcinoma.  EXAM: CT ABDOMEN AND PELVIS WITH CONTRAST  TECHNIQUE: Multidetector CT imaging of the abdomen and pelvis was performed using the standard protocol following bolus administration of intravenous contrast.  CONTRAST:  140mL OMNIPAQUE IOHEXOL 300 MG/ML  SOLN  COMPARISON:  01/27/2014  FINDINGS: Postop changes again seen from previous Whipple procedure and gastrojejunostomy. Mild diffuse edema and peripancreatic inflammatory changes are seen involving the body and tail, consistent with acute pancreatitis. No evidence of pancreatic soft tissue mass or pseudocysts. No evidence of biliary ductal dilatation.  The liver, spleen, adrenal glands, and kidneys are normal in appearance. No evidence of  hydronephrosis. No evidence of lymphadenopathy within the abdomen or pelvis.  Normal appendix visualized. No evidence of bowel wall thickening or obstruction. No abnormal fluid collections seen. No suspicious bone lesions identified.  IMPRESSION: Mild-to-moderate acute pancreatitis. No evidence of pancreatic necrosis, pseudocyst formation, or other complication.  Stable postop changes from previous Whipple procedure and gastrojejunostomy. No evidence of recurrent or metastatic carcinoma.   Electronically Signed   By: Earle Gell M.D.   On: 06/10/2014 23:29  All radiology studies independently viewed by me.      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 On exam, nontoxic, non-distressed, but with tenderness to palpation of his epigastrium and left upper quadrant without rebound, rigidity, or guarding. Labs showed highly elevated lipase. CT shows evidence of pancreatitis. Pain controlled with IV Dilaudid. Plan admission to internal medicine. Discussed with Dr. Hal Hope.  I personally performed the services described in this documentation, which was scribed in my presence. The recorded information has been reviewed and is accurate.     Arbie Cookey, MD 06/10/14 [phone]

## 2014-06-11 ENCOUNTER — Encounter (HOSPITAL_COMMUNITY): Payer: Self-pay | Admitting: Internal Medicine

## 2014-06-11 ENCOUNTER — Inpatient Hospital Stay (HOSPITAL_COMMUNITY): Payer: 59

## 2014-06-11 DIAGNOSIS — I10 Essential (primary) hypertension: Secondary | ICD-10-CM

## 2014-06-11 DIAGNOSIS — K859 Acute pancreatitis without necrosis or infection, unspecified: Principal | ICD-10-CM

## 2014-06-11 DIAGNOSIS — R51 Headache: Secondary | ICD-10-CM

## 2014-06-11 DIAGNOSIS — R519 Headache, unspecified: Secondary | ICD-10-CM | POA: Diagnosis present

## 2014-06-11 DIAGNOSIS — E119 Type 2 diabetes mellitus without complications: Secondary | ICD-10-CM

## 2014-06-11 LAB — BASIC METABOLIC PANEL
Anion gap: 16 — ABNORMAL HIGH (ref 5–15)
BUN: 9 mg/dL (ref 6–23)
CALCIUM: 8.6 mg/dL (ref 8.4–10.5)
CO2: 22 mEq/L (ref 19–32)
CREATININE: 0.83 mg/dL (ref 0.50–1.35)
Chloride: 101 mEq/L (ref 96–112)
GFR calc non Af Amer: >90 mL/min (ref >90)
Glucose, Bld: 115 mg/dL — ABNORMAL HIGH (ref 70–99)
Potassium: 3.9 mEq/L (ref 3.7–5.3)
Sodium: 139 mEq/L (ref 137–147)

## 2014-06-11 LAB — GLUCOSE, CAPILLARY
GLUCOSE-CAPILLARY: 84 mg/dL (ref 70–99)
Glucose-Capillary: 126 mg/dL — ABNORMAL HIGH (ref 70–99)
Glucose-Capillary: 109 mg/dL — ABNORMAL HIGH (ref 70–99)
Glucose-Capillary: 86 mg/dL (ref 70–99)
Glucose-Capillary: 86 mg/dL (ref 70–99)

## 2014-06-11 LAB — CBC WITH DIFFERENTIAL/PLATELET
BASOS ABS: 0 10*3/uL (ref 0.0–0.1)
BASOS PCT: 0 % (ref 0–1)
EOS ABS: 0 10*3/uL (ref 0.0–0.7)
Eosinophils Relative: 0 % (ref 0–5)
HEMATOCRIT: 37.6 % — AB (ref 39.0–52.0)
Hemoglobin: 12.3 g/dL — ABNORMAL LOW (ref 13.0–17.0)
Lymphocytes Relative: 19 % (ref 12–46)
Lymphs Abs: 2.1 10*3/uL (ref 0.7–4.0)
MCH: 27.6 pg (ref 26.0–34.0)
MCHC: 32.7 g/dL (ref 30.0–36.0)
MCV: 84.5 fL (ref 78.0–100.0)
MONO ABS: 0.8 10*3/uL (ref 0.1–1.0)
Monocytes Relative: 7 % (ref 3–12)
Neutro Abs: 8.2 10*3/uL — ABNORMAL HIGH (ref 1.7–7.7)
Neutrophils Relative %: 74 % (ref 43–77)
Platelets: 148 10*3/uL — ABNORMAL LOW (ref 150–400)
RBC: 4.45 MIL/uL (ref 4.22–5.81)
RDW: 13 % (ref 11.5–15.5)
WBC: 11.1 10*3/uL — ABNORMAL HIGH (ref 4.0–10.5)

## 2014-06-11 LAB — HEPATIC FUNCTION PANEL
ALT: 12 U/L (ref 0–53)
AST: 16 U/L (ref 0–37)
Albumin: 3.6 g/dL (ref 3.5–5.2)
Alkaline Phosphatase: 32 U/L — ABNORMAL LOW (ref 39–117)
Bilirubin, Direct: <0.2 mg/dL (ref 0.0–0.3)
Total Bilirubin: 0.8 mg/dL (ref 0.3–1.2)
Total Protein: 6.4 g/dL (ref 6.0–8.3)

## 2014-06-11 LAB — LIPID PANEL
Cholesterol: 119 mg/dL (ref 0–200)
HDL: 63 mg/dL (ref >39)
LDL CALC: 46 mg/dL (ref 0–99)
Total CHOL/HDL Ratio: 1.9 RATIO
Triglycerides: 50 mg/dL (ref <150)
VLDL: 10 mg/dL (ref 0–40)

## 2014-06-11 LAB — CBG MONITORING, ED: GLUCOSE-CAPILLARY: 137 mg/dL — AB (ref 70–99)

## 2014-06-11 MED ORDER — PANTOPRAZOLE SODIUM 40 MG PO TBEC: 40.0000 mg | DELAYED_RELEASE_TABLET | Freq: Every day | ORAL | Status: DC

## 2014-06-11 MED ORDER — INSULIN ASPART 100 UNIT/ML ~~LOC~~ SOLN: 0.0000 [IU] | Freq: Three times a day (TID) | SUBCUTANEOUS | Status: DC

## 2014-06-11 MED ORDER — HYDRALAZINE HCL 20 MG/ML IJ SOLN: 10.0000 mg | INTRAMUSCULAR | Status: DC | PRN

## 2014-06-11 MED ORDER — ACETAMINOPHEN 325 MG PO TABS: 650.0000 mg | ORAL_TABLET | Freq: Four times a day (QID) | ORAL | Status: DC | PRN

## 2014-06-11 MED ORDER — METOPROLOL TARTRATE 1 MG/ML IV SOLN
2.5000 mg | Freq: Four times a day (QID) | INTRAVENOUS | Status: DC
  Administered 2014-06-11 – 2014-06-12 (×5): 2.5 mg via INTRAVENOUS
  Filled 2014-06-11 (×11): qty 5

## 2014-06-11 MED ORDER — ONDANSETRON HCL 4 MG/2ML IJ SOLN
4.0000 mg | Freq: Four times a day (QID) | INTRAMUSCULAR | Status: DC | PRN
Start: 2014-06-11 — End: 2014-06-13

## 2014-06-11 MED ORDER — ACETAMINOPHEN 650 MG RE SUPP: 650.0000 mg | Freq: Four times a day (QID) | RECTAL | Status: DC | PRN

## 2014-06-11 MED ORDER — HYDROMORPHONE HCL PF 1 MG/ML IJ SOLN
1.0000 mg | INTRAMUSCULAR | Status: DC | PRN
  Administered 2014-06-11 (×4): 1 mg via INTRAVENOUS
  Filled 2014-06-11 (×4): qty 1

## 2014-06-11 MED ORDER — INSULIN ASPART 100 UNIT/ML ~~LOC~~ SOLN
0.0000 [IU] | SUBCUTANEOUS | Status: DC
  Administered 2014-06-12 (×2): 1 [IU] via SUBCUTANEOUS

## 2014-06-11 MED ORDER — ENOXAPARIN SODIUM 40 MG/0.4ML ~~LOC~~ SOLN
40.0000 mg | SUBCUTANEOUS | Status: DC
  Administered 2014-06-11: 40 mg via SUBCUTANEOUS
  Filled 2014-06-11 (×2): qty 0.4

## 2014-06-11 MED ORDER — PANTOPRAZOLE SODIUM 40 MG IV SOLR
40.0000 mg | INTRAVENOUS | Status: DC
  Administered 2014-06-11 – 2014-06-12 (×2): 40 mg via INTRAVENOUS
  Filled 2014-06-11 (×3): qty 40

## 2014-06-11 MED ORDER — ONDANSETRON HCL 4 MG PO TABS: 4.0000 mg | ORAL_TABLET | Freq: Four times a day (QID) | ORAL | Status: DC | PRN

## 2014-06-11 MED ORDER — SODIUM CHLORIDE 0.9 % IV SOLN
INTRAVENOUS | Status: DC
  Administered 2014-06-11: 03:00:00 via INTRAVENOUS

## 2014-06-11 MED ORDER — DEXTROSE-NACL 5-0.9 % IV SOLN
INTRAVENOUS | Status: DC
  Administered 2014-06-11 – 2014-06-12 (×2): via INTRAVENOUS

## 2014-06-11 NOTE — Telephone Encounter (Signed)
Called to check on pt and was informed he had been admitted to our service.

## 2014-06-11 NOTE — Progress Notes (Signed)
Received pt report from [place].

## 2014-06-11 NOTE — Telephone Encounter (Signed)
Please have pt follow up with me in 2-4 weeks after discharge and have him get MRI/MRCP prior to visit.

## 2014-06-11 NOTE — H&P (Signed)
[facility] History and Physical  Hector Pugh ZOX:[id number] DOB: 06-30-1961 DOA: 06/10/2014  Referring physician: Patient was transferred from [facility]. PCP: [name], MD   Chief Complaint: Abdominal pain.  HPI: Hector Pugh is a 53 y.o. male with history of ampullary carcinoma status post Whipple procedure, diabetes mellitus, hypertension presents to the ER because of sudden onset of abdominal pain since yesterday morning. Pain is mostly in the left upper quadrant and epigastrium. Patient has nausea with diarrhea denies any vomiting. Denies any chest pain or shortness of breath. Has been having some nonproductive cough. In the ER patient's labs show elevated lipase and CT abdomen and pelvis shows features consistent with acute pancreatitis. Patient has been admitted for further management. In addition patient has been having frontal headache since coming to the ER. Denies any focal deficits visual symptoms. Patient states his metformin dose was recently increased 2 months ago and he has had taken some Lyrica and NSAIDs couple of days ago for low back pain. He was mildly febrile in the ER otherwise patient did not have any fever chills.  Review of Systems: As presented in the history of presenting illness, rest negative.  Past Medical History  Diagnosis Date  . Hypertension   . HTN (hypertension) 11/10/2012  . Pancreatitis 11/10/2012  . 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 Biliary stricture 11/11/2012    S/p biliary stent 11/08/12  . Seasonal allergies   . Diabetes mellitus without complication     diet controlled  . Diabetes mellitus 11/10/2012  . Cancer   . Pancreatic cancer    Past Surgical History  Procedure Laterality Date  . Circucision    . Hernia repair      RIGHT  . Left foot surgery    . Vasectomy    . Circumsision    . Left foot surgery    . Eus  11/08/2012    Procedure: UPPER ENDOSCOPIC ULTRASOUND (EUS) LINEAR;  Surgeon:  Arta Silence, MD;  Location: [facility];  Service: Endoscopy;  Laterality: N/A;  . Fine needle aspiration  11/08/2012    Procedure: FINE NEEDLE ASPIRATION (FNA) LINEAR;  Surgeon: Arta Silence, MD;  Location: [facility];  Service: Endoscopy;  Laterality: N/A;  . Ercp  11/08/2012    Procedure: ENDOSCOPIC RETROGRADE CHOLANGIOPANCREATOGRAPHY (ERCP);  Surgeon: Jeryl Columbia, MD;  Location: [facility];  Service: Gastroenterology;  Laterality: N/A;  . Laparoscopy  12/12/2012    Procedure: LAPAROSCOPY DIAGNOSTIC;  Surgeon: Stark Klein, MD;  Location: [facility];  Service: General;  Laterality: N/A;  . Whipple procedure  12/12/2012    Procedure: WHIPPLE PROCEDURE;  Surgeon: Stark Klein, MD;  Location: [facility];  Service: General;  Laterality: N/A;   Social History:  reports that he has never smoked. He has never used smokeless tobacco. He reports that he does not drink alcohol or use illicit drugs. Where does patient live at home. Can patient participate in ADLs? Yes.  No Known Allergies  Family History:  Family History  Problem Relation Age of Onset  . Cancer Mother     esophageal carcinoma      Prior to Admission medications   Medication Sig Start Date End Date Taking? Authorizing Provider  acetaminophen (TYLENOL) 500 MG tablet Take 500 mg by mouth every 4 (four) hours as needed.    Historical Provider, MD  CREON 12000 UNITS CPEP capsule take 2 capsules wuth small meals and snacks; take 3-4 capsules with large/fatty meals  Stark Klein, MD  Docusate Sodium (DSS) 100 MG CAPS Take 100 mg by mouth 2 (two) times daily as needed. 12/23/12   Stark Klein, MD  feeding supplement (GLUCERNA SHAKE) LIQD Take 237 mLs by mouth 3 (three) times daily between meals. 12/23/12   Stark Klein, MD  metFORMIN (GLUCOPHAGE) 500 MG tablet Take 500 mg by mouth 2 (two) times daily. 11/17/13   Historical Provider, MD  metoprolol succinate (TOPROL-XL) 100 MG 24 hr tablet Take 100 mg by mouth daily before breakfast. Take  with or immediately following a meal.    Historical Provider, MD  ONETOUCH VERIO test strip  01/28/13   Historical Provider, MD  traMADol (ULTRAM) 50 MG tablet Take 1 tablet (50 mg total) by mouth every 6 (six) hours as needed. 12/23/12   Stark Klein, MD    Physical Exam: Filed Vitals:   06/10/14 2219 06/10/14 2318 06/11/14 0019 06/11/14 0057  BP:  162/87 131/78 150/90  Pulse:  94 101 91  Temp: 100.1 F (37.8 C) 100 F (37.8 C)  99.6 F (37.6 C)  TempSrc: Oral Oral  Oral  Resp:  24 20 22   Height:    5\' 2"  (1.575 m)  Weight:    70.6 kg (155 lb 10.3 oz)  SpO2:  97% 97% 97%     General:  Well-developed and nourished.  Eyes: Anicteric no pallor.  ENT: No discharge from the ears eyes nose mouth.  Neck: No mass felt.  Cardiovascular: S1-S2 heard.  Respiratory: No rhonchi or crepitations.  Abdomen: Soft mild tenderness in epigastric area no guarding or rigidity.  Skin: No rash.  Musculoskeletal: No edema.  Psychiatric: Appears normal.  Neurologic: Alert awake oriented to time place and person. Moves all extremities 5 x 5.  Labs on Admission:  Basic Metabolic Panel:  Recent Labs Lab 06/10/14 2100  NA 140  K 4.2  CL 102  CO2 25  GLUCOSE 146*  BUN 10  CREATININE 0.90  CALCIUM 9.8   Liver Function Tests:  Recent Labs Lab 06/10/14 2100  AST 20  ALT 15  ALKPHOS 41  BILITOT 0.5  PROT 7.4  ALBUMIN 4.6    Recent Labs Lab 06/10/14 2100  LIPASE 1142*   No results found for this basename: AMMONIA,  in the last 168 hours CBC:  Recent Labs Lab 06/10/14 2100  WBC 10.5  NEUTROABS 8.9*  HGB 13.9  HCT 41.8  MCV 85.0  PLT 149*   Cardiac Enzymes: No results found for this basename: CKTOTAL, CKMB, CKMBINDEX, TROPONINI,  in the last 168 hours  BNP (last 3 results) No results found for this basename: PROBNP,  in the last 8760 hours CBG:  Recent Labs Lab 06/11/14 0019  GLUCAP 137*    Radiological Exams on Admission: Ct Abdomen Pelvis W  Contrast  06/10/2014   CLINICAL DATA:  Left upper quadrant pain. Diarrhea. Pancreatitis. Previous Whipple procedure for ampullary carcinoma.  EXAM: CT ABDOMEN AND PELVIS WITH CONTRAST  TECHNIQUE: Multidetector CT imaging of the abdomen and pelvis was performed using the standard protocol following bolus administration of intravenous contrast.  CONTRAST:  12mL OMNIPAQUE IOHEXOL 300 MG/ML  SOLN  COMPARISON:  01/27/2014  FINDINGS: Postop changes again seen from previous Whipple procedure and gastrojejunostomy. Mild diffuse edema and peripancreatic inflammatory changes are seen involving the body and tail, consistent with acute pancreatitis. No evidence of pancreatic soft tissue mass or pseudocysts. No evidence of biliary ductal dilatation.  The liver, spleen, adrenal glands, and kidneys are normal in appearance.  No evidence of hydronephrosis. No evidence of lymphadenopathy within the abdomen or pelvis.  Normal appendix visualized. No evidence of bowel wall thickening or obstruction. No abnormal fluid collections seen. No suspicious bone lesions identified.  IMPRESSION: Mild-to-moderate acute pancreatitis. No evidence of pancreatic necrosis, pseudocyst formation, or other complication.  Stable postop changes from previous Whipple procedure and gastrojejunostomy. No evidence of recurrent or metastatic carcinoma.   Electronically Signed   By: Earle Gell M.D.   On: 06/10/2014 23:29     Assessment/Plan Principal Problem:   Acute pancreatitis Active Problems:   Diabetes mellitus   HTN (hypertension)   Headache   1. Acute pancreatitis in a patient with known history of Whipple's procedure for ampullary carcinoma - cause for pancreatitis is not clear. Patient will be kept n.p.o. with IV fluids and pain relief medications. Check lipid panel specifically for triglycerides. They started by Dr. Barry Dienes, patient's surgeon in a.m. about patient's admission. Closely follow intake output and metabolic panel. 2. Headache  - patient states he has significant headache but patient is nonfocal. CT head is pending. 3. Thrombocytopenia - closely follow CBC. 4. Diabetes mellitus - presently patient is placed on per scale coverage. 5. Hypertension - since patient is n.p.o. I have placed patient on scheduled dose of metoprolol 2.5 mg IV every 6 hourly with when necessary IV hydralazine.  Chest x-ray is pending.    Code Status: Full code.  Family Communication: None.  Disposition Plan: Admit to inpatient.    Sila Sarsfield N. [facility] Pager [phone].  If 7PM-7AM, please contact night-coverage [email or web address] Password [email or web address] 06/11/2014, 2:58 AM

## 2014-06-11 NOTE — Progress Notes (Signed)
PROGRESS NOTE  Deronte Solis WUJ:[id number] DOB: 02-Jun-1961 DOA: 06/10/2014 PCP: [name], MD  Brief history  53 y.o. male with history of ampullary carcinoma status post Whipple procedure, diabetes mellitus, hypertension presents to the ER because of sudden onset of abdominal pain since 7/7 am. Denied any n/v/d, no chest pain or sob. Workup revealed elevated lipase with CT consistent with acute pancreatitis.   Subjective Requesting ice chips or something to drink. Denies any headache, n/v/d. Still with some left sided abdominal pain   Assessment/Plan:  Acute pancreatitis -unclear etiology at this point. TG normal, no gallstones or CBD dilitation on CT, no causative medications -continue NPO for now. Will advance diet slowly. Add PPI -spoke with Dr. Barry Dienes who follows closely outpt. At discharge will arrange for MRI/MRCP ~3 weeks out to further assess of cause. F/u with surgery after scan  Headache -CT head negative. No focal deficits on exam. Now resolved -supportive care  Thrombocytopenia, mild -? Related to acute illness -monitor closely   Htn -well controlled  -continue IV BB while NPO with prn hydralazine -resume home meds when able  DM -cbgs stable -continue SSI    DVT Prophylaxis:  SQ Lovenox  Code Status: full Family Communication: wife at bedside Disposition Plan: remain inpt   Consultants:  none  Procedures:  none  Antibiotics: Anti-infectives   None         Objective: Filed Vitals:   06/11/14 0057 06/11/14 0401 06/11/14 0627 06/11/14 1301  BP: 150/90 127/72 130/74 133/76  Pulse: 91 86 77 68  Temp: 99.6 F (37.6 C) 99.6 F (37.6 C)  98.9 F (37.2 C)  TempSrc: Oral Oral  Oral  Resp: 22 20  18   Height: 5\' 2"  (1.575 m)     Weight: 70.6 kg (155 lb 10.3 oz)     SpO2: 97% 97%  96%   No intake or output data in the 24 hours ending 06/11/14 1518 Filed Weights   06/10/14 2016 06/11/14 0057  Weight: 69.4 kg (153 lb) 70.6 kg  (155 lb 10.3 oz)    Exam: General: Well developed, well nourished, NAD, appears stated age  Cardiovascular: RRR, S1 S2 auscultated, no rubs, murmurs or gallops.   Respiratory: Clear to auscultation bilaterally with equal chest rise  Abdomen: Soft, BS+, tenderness on palpation of upper abdomen, no rebound or guarding Extremities: warm dry without cyanosis clubbing or edema.  Neuro: AAOx3, cranial nerves grossly intact. Strength 5/5 in upper and lower extremities  Skin: Without rashes exudates or nodules.   Psych: Normal affect and demeanor with intact judgement and insight   Data Reviewed: Basic Metabolic Panel:  Recent Labs Lab 06/10/14 2100 06/11/14 0550  NA 140 139  K 4.2 3.9  CL 102 101  CO2 25 22  GLUCOSE 146* 115*  BUN 10 9  CREATININE 0.90 0.83  CALCIUM 9.8 8.6   Liver Function Tests:  Recent Labs Lab 06/10/14 2100 06/11/14 0550  AST 20 16  ALT 15 12  ALKPHOS 41 32*  BILITOT 0.5 0.8  PROT 7.4 6.4  ALBUMIN 4.6 3.6    Recent Labs Lab 06/10/14 2100  LIPASE 1142*   No results found for this basename: AMMONIA,  in the last 168 hours CBC:  Recent Labs Lab 06/10/14 2100 06/11/14 0550  WBC 10.5 11.1*  NEUTROABS 8.9* 8.2*  HGB 13.9 12.3*  HCT 41.8 37.6*  MCV 85.0 84.5  PLT 149* 148*   Cardiac Enzymes: No results found for this basename: CKTOTAL, CKMB, CKMBINDEX, TROPONINI,  in the last 168 hours BNP (last 3 results) No results found for this basename: PROBNP,  in the last 8760 hours CBG:  Recent Labs Lab 06/11/14 0019 06/11/14 0402 06/11/14 0810 06/11/14 1150  GLUCAP 137* 126* 109* 86    No results found for this or any previous visit (from the past 240 hour(s)).   Studies: Dg Chest 1 View  06/11/2014   CLINICAL DATA:  Cough.  EXAM: CHEST - 1 VIEW  COMPARISON:  PA and lateral chest 01/22/2014 and 09/21/2010. CT chest 11/10/2012.  FINDINGS: Subsegmental atelectasis is seen in the left lung base. The lungs are otherwise clear. Heart size is  normal. There is no pneumothorax or pleural effusion.  IMPRESSION: No acute findings.  Subsegmental atelectasis left lung base.   Electronically Signed   By: Inge Rise M.D.   On: 06/11/2014 07:07   Ct Head Wo Contrast  06/11/2014   CLINICAL DATA:  Headache  EXAM: CT HEAD WITHOUT CONTRAST  TECHNIQUE: Contiguous axial images were obtained from the base of the skull through the vertex without intravenous contrast.  COMPARISON:  None.  FINDINGS: There is no acute intracranial hemorrhage or infarct. No mass lesion or midline shift. Gray-white matter differentiation is well maintained. Ventricles are normal in size without evidence of hydrocephalus. CSF containing spaces are within normal limits. No extra-axial fluid collection.  The calvarium is intact.  Orbital soft tissues are within normal limits.  There is partial opacification of the left posterior ethmoidal air cells. Paranasal sinuses are otherwise clear. No mastoid effusion.  Scalp soft tissues are unremarkable.  IMPRESSION: 1. Unremarkable head CT with no acute intracranial process identified. 2. Mild left ethmoidal sinus disease.   Electronically Signed   By: Jeannine Boga M.D.   On: 06/11/2014 04:50   Ct Abdomen Pelvis W Contrast  06/10/2014   CLINICAL DATA:  Left upper quadrant pain. Diarrhea. Pancreatitis. Previous Whipple procedure for ampullary carcinoma.  EXAM: CT ABDOMEN AND PELVIS WITH CONTRAST  TECHNIQUE: Multidetector CT imaging of the abdomen and pelvis was performed using the standard protocol following bolus administration of intravenous contrast.  CONTRAST:  159mL OMNIPAQUE IOHEXOL 300 MG/ML  SOLN  COMPARISON:  01/27/2014  FINDINGS: Postop changes again seen from previous Whipple procedure and gastrojejunostomy. Mild diffuse edema and peripancreatic inflammatory changes are seen involving the body and tail, consistent with acute pancreatitis. No evidence of pancreatic soft tissue mass or pseudocysts. No evidence of biliary ductal  dilatation.  The liver, spleen, adrenal glands, and kidneys are normal in appearance. No evidence of hydronephrosis. No evidence of lymphadenopathy within the abdomen or pelvis.  Normal appendix visualized. No evidence of bowel wall thickening or obstruction. No abnormal fluid collections seen. No suspicious bone lesions identified.  IMPRESSION: Mild-to-moderate acute pancreatitis. No evidence of pancreatic necrosis, pseudocyst formation, or other complication.  Stable postop changes from previous Whipple procedure and gastrojejunostomy. No evidence of recurrent or metastatic carcinoma.   Electronically Signed   By: Earle Gell M.D.   On: 06/10/2014 23:29    Scheduled Meds: . enoxaparin (LOVENOX) injection  40 mg Subcutaneous Q24H  . insulin aspart  0-9 Units Subcutaneous TID WC  . metoprolol  2.5 mg Intravenous 4 times per day   Continuous Infusions: . sodium chloride 100 mL/hr at 06/11/14 0320    Principal Problem:   Acute pancreatitis Active Problems:   Diabetes mellitus   HTN (hypertension)   Headache   Patrici Ranks, NP-C [place]  pgr [phone]  If 7PM-7AM, please contact night-coverage at [email or web address], password [facility] 06/11/2014, 3:18 PM  LOS: 1 day

## 2014-06-11 NOTE — Progress Notes (Signed)
Utilization review completed.  

## 2014-06-11 NOTE — Progress Notes (Signed)
Pt seen and examined. Agree with assessment and plan per Ms. Jonathon Bellows, NP. Briefly, pt presents with acute pancreatitis. Slowly improving. Thus far, LFT's unremarkable and without signs of biliary obstruction/stones. Cont NPO status with analgesics as tolerated. Hopefully begin to advance diet soon.

## 2014-06-11 NOTE — Care Management Note (Signed)
    Page 1 of 1   06/11/2014     4:34:41 PM CARE MANAGEMENT NOTE 06/11/2014  Patient:  Hector Pugh, Hector Pugh   Account Number:  [id number]  Date Initiated:  06/11/2014  Documentation initiated by:  Tomi Bamberger  Subjective/Objective Assessment:   dx pancreatitis  admit- lives with family.     Action/Plan:   advance diet   Anticipated DC Date:  06/13/2014   Anticipated DC Plan:  [place]  CM consult      Choice offered to / List presented to:             Status of service:  In process, will continue to follow Medicare Important Message given?   (If response is "NO", the following Medicare IM given date fields will be blank) Date Medicare IM given:   Medicare IM given by:   Date Additional Medicare IM given:   Additional Medicare IM given by:    Discharge Disposition:    Per UR Regulation:  Reviewed for med. necessity/level of care/duration of stay  If discussed at [place] of Stay Meetings, dates discussed:    Comments:

## 2014-06-11 NOTE — Progress Notes (Signed)
Pt arrived on unit, alert and oriented x4. Appeared to be in no distress, no SOB noted. RAC IV site clean, dry and intact. Able to make needs known. Vital signs taken and stable. Skin intact as assessed. Pt care guide provided. Oriented to room and staff. Bed place at its lowest position. Call light within reached. Paged admitting doctor. We will continue to monitor.

## 2014-06-12 ENCOUNTER — Other Ambulatory Visit (INDEPENDENT_AMBULATORY_CARE_PROVIDER_SITE_OTHER): Payer: Self-pay | Admitting: General Surgery

## 2014-06-12 DIAGNOSIS — C241 Malignant neoplasm of ampulla of Vater: Secondary | ICD-10-CM

## 2014-06-12 LAB — GLUCOSE, CAPILLARY
GLUCOSE-CAPILLARY: 104 mg/dL — AB (ref 70–99)
Glucose-Capillary: 101 mg/dL — ABNORMAL HIGH (ref 70–99)
Glucose-Capillary: 124 mg/dL — ABNORMAL HIGH (ref 70–99)
Glucose-Capillary: 130 mg/dL — ABNORMAL HIGH (ref 70–99)
Glucose-Capillary: 133 mg/dL — ABNORMAL HIGH (ref 70–99)
Glucose-Capillary: 98 mg/dL (ref 70–99)

## 2014-06-12 LAB — BASIC METABOLIC PANEL
Anion gap: 14 (ref 5–15)
BUN: 11 mg/dL (ref 6–23)
CO2: 22 meq/L (ref 19–32)
Calcium: 8.5 mg/dL (ref 8.4–10.5)
Chloride: 104 mEq/L (ref 96–112)
Creatinine, Ser: 0.87 mg/dL (ref 0.50–1.35)
GFR calc Af Amer: >90 mL/min (ref >90)
GFR calc non Af Amer: >90 mL/min (ref >90)
GLUCOSE: 128 mg/dL — AB (ref 70–99)
Potassium: 4.5 mEq/L (ref 3.7–5.3)
SODIUM: 140 meq/L (ref 137–147)

## 2014-06-12 LAB — CBC
HCT: 37.6 % — ABNORMAL LOW (ref 39.0–52.0)
HEMOGLOBIN: 12.1 g/dL — AB (ref 13.0–17.0)
MCH: 27.5 pg (ref 26.0–34.0)
MCHC: 32.2 g/dL (ref 30.0–36.0)
MCV: 85.5 fL (ref 78.0–100.0)
Platelets: 116 10*3/uL — ABNORMAL LOW (ref 150–400)
RBC: 4.4 MIL/uL (ref 4.22–5.81)
RDW: 13 % (ref 11.5–15.5)
WBC: 4.4 10*3/uL (ref 4.0–10.5)

## 2014-06-12 MED ORDER — INSULIN ASPART 100 UNIT/ML ~~LOC~~ SOLN
0.0000 [IU] | Freq: Three times a day (TID) | SUBCUTANEOUS | Status: DC
  Administered 2014-06-12: 1 [IU] via SUBCUTANEOUS
  Administered 2014-06-13: 2 [IU] via SUBCUTANEOUS

## 2014-06-12 MED ORDER — SODIUM CHLORIDE 0.9 % IV SOLN
INTRAVENOUS | Status: DC
  Administered 2014-06-12 – 2014-06-13 (×2): via INTRAVENOUS

## 2014-06-12 MED ORDER — INSULIN ASPART 100 UNIT/ML ~~LOC~~ SOLN: 0.0000 [IU] | Freq: Three times a day (TID) | SUBCUTANEOUS | Status: DC

## 2014-06-12 MED ORDER — PNEUMOCOCCAL VAC POLYVALENT 25 MCG/0.5ML IJ INJ
0.5000 mL | INJECTION | INTRAMUSCULAR | Status: AC
  Administered 2014-06-13: 0.5 mL via INTRAMUSCULAR
  Filled 2014-06-12: qty 0.5

## 2014-06-12 MED ORDER — METOPROLOL SUCCINATE ER 100 MG PO TB24
100.0000 mg | ORAL_TABLET | Freq: Every day | ORAL | Status: DC
  Administered 2014-06-12 – 2014-06-13 (×2): 100 mg via ORAL
  Filled 2014-06-12 (×2): qty 1

## 2014-06-12 NOTE — Progress Notes (Signed)
Pt seen and examined. Agree with above assessment and plan per G[place]on Folk, NP. Briefly, pt presents with acute pancreatitis. Pt to follow up as outpt for MRCP. Otherwise, pt to continue to advance diet as tolerated.

## 2014-06-12 NOTE — Progress Notes (Signed)
PROGRESS NOTE  Hector Pugh DPO:[id number] DOB: 1961-05-17 DOA: 06/10/2014 PCP: [name], MD  HPI/Subjective:       53 y.o. male with history of ampullary carcinoma status post Whipple procedure, diabetes mellitus, hypertension presents to the ER because of sudden onset of abdominal pain since 7/7 am. Denied any n/v/d, no chest pain or sob. Workup revealed elevated lipase with CT consistent with acute pancreatitis. Spoke with pt's surgeon, Dr. Barry Dienes. Plan is for d/c home when ready with follow-up MRI in 3-4 weeks.      Pt feeling better today with decreased abdominal pain. Anxious to eat.  Assessment/Plan:  Acute pancreatitis  -unclear etiology at this point. TG normal, no gallstones or CBD dilitation on CT, no causative medications  -advance to full liquids. Continue PPI -spoke with Dr. Barry Dienes who follows closely outpt. At discharge will arrange for MRI/MRCP ~4 weeks out to further assess of cause. F/u with surgery after scan   Headache  -CT head negative. No focal deficits on exam. Now resolved  -supportive care   Thrombocytopenia, mild  -? Related to acute illness. Down to 116 today.  -d/c Lovenox. SCDs. Consider HIT panel based on am labs -monitor closely   Htn  -well controlled  -continue IV BB while NPO with prn hydralazine  -resume home meds when able   DM  -cbgs stable. Will d/c D5 when tolerating diet  -continue SSI   DVT Prophylaxis:  scds  Code Status: full  Disposition Plan: home when tolerating pos  Consultants:  none  Procedures:  none  Antibiotics: Antibiotics Given (last 72 hours)   None      Objective: Filed Vitals:   06/12/14 0025 06/12/14 0542 06/12/14 0617 06/12/14 1243  BP: 132/74 148/79 151/81 147/80  Pulse: 70 62  60  Temp:  98 F (36.7 C)    TempSrc:  Oral    Resp:  18    Height:      Weight:      SpO2:  99%      Intake/Output Summary (Last 24 hours) at 06/12/14 1315 Last data filed at 06/12/14 1200  Gross per  24 hour  Intake   2400 ml  Output      0 ml  Net   2400 ml   Filed Weights   06/10/14 2016 06/11/14 0057  Weight: 69.4 kg (153 lb) 70.6 kg (155 lb 10.3 oz)    Exam: General: Well developed, well nourished, NAD, appears stated age  CV: S1S2 RRR, no m/r/g Resp: CTAB GI: abdomen soft, NT/ND, BS+ Psych: Normal affect and demeanor with intact judgement and insight   Data Reviewed: Basic Metabolic Panel:  Recent Labs Lab 06/10/14 2100 06/11/14 0550 06/12/14 0742  NA 140 139 140  K 4.2 3.9 4.5  CL 102 101 104  CO2 25 22 22   GLUCOSE 146* 115* 128*  BUN 10 9 11   CREATININE 0.90 0.83 0.87  CALCIUM 9.8 8.6 8.5   Liver Function Tests:  Recent Labs Lab 06/10/14 2100 06/11/14 0550  AST 20 16  ALT 15 12  ALKPHOS 41 32*  BILITOT 0.5 0.8  PROT 7.4 6.4  ALBUMIN 4.6 3.6    Recent Labs Lab 06/10/14 2100  LIPASE 1142*   No results found for this basename: AMMONIA,  in the last 168 hours CBC:  Recent Labs Lab 06/10/14 2100 06/11/14 0550 06/12/14 0742  WBC 10.5 11.1* 4.4  NEUTROABS 8.9* 8.2*  --   HGB 13.9 12.3* 12.1*  HCT 41.8 37.6*  37.6*  MCV 85.0 84.5 85.5  PLT 149* 148* 116*   Cardiac Enzymes: No results found for this basename: CKTOTAL, CKMB, CKMBINDEX, TROPONINI,  in the last 168 hours BNP (last 3 results) No results found for this basename: PROBNP,  in the last 8760 hours CBG:  Recent Labs Lab 06/11/14 2012 06/12/14 0011 06/12/14 0408 06/12/14 0754 06/12/14 1152  GLUCAP 84 101* 104* 130* 133*    No results found for this or any previous visit (from the past 240 hour(s)).   Studies: Dg Chest 1 View  06/11/2014   CLINICAL DATA:  Cough.  EXAM: CHEST - 1 VIEW  COMPARISON:  PA and lateral chest 01/22/2014 and 09/21/2010. CT chest 11/10/2012.  FINDINGS: Subsegmental atelectasis is seen in the left lung base. The lungs are otherwise clear. Heart size is normal. There is no pneumothorax or pleural effusion.  IMPRESSION: No acute findings.  Subsegmental  atelectasis left lung base.   Electronically Signed   By: Inge Rise M.D.   On: 06/11/2014 07:07   Ct Head Wo Contrast  06/11/2014   CLINICAL DATA:  Headache  EXAM: CT HEAD WITHOUT CONTRAST  TECHNIQUE: Contiguous axial images were obtained from the base of the skull through the vertex without intravenous contrast.  COMPARISON:  None.  FINDINGS: There is no acute intracranial hemorrhage or infarct. No mass lesion or midline shift. Gray-white matter differentiation is well maintained. Ventricles are normal in size without evidence of hydrocephalus. CSF containing spaces are within normal limits. No extra-axial fluid collection.  The calvarium is intact.  Orbital soft tissues are within normal limits.  There is partial opacification of the left posterior ethmoidal air cells. Paranasal sinuses are otherwise clear. No mastoid effusion.  Scalp soft tissues are unremarkable.  IMPRESSION: 1. Unremarkable head CT with no acute intracranial process identified. 2. Mild left ethmoidal sinus disease.   Electronically Signed   By: Jeannine Boga M.D.   On: 06/11/2014 04:50   Ct Abdomen Pelvis W Contrast  06/10/2014   CLINICAL DATA:  Left upper quadrant pain. Diarrhea. Pancreatitis. Previous Whipple procedure for ampullary carcinoma.  EXAM: CT ABDOMEN AND PELVIS WITH CONTRAST  TECHNIQUE: Multidetector CT imaging of the abdomen and pelvis was performed using the standard protocol following bolus administration of intravenous contrast.  CONTRAST:  163mL OMNIPAQUE IOHEXOL 300 MG/ML  SOLN  COMPARISON:  01/27/2014  FINDINGS: Postop changes again seen from previous Whipple procedure and gastrojejunostomy. Mild diffuse edema and peripancreatic inflammatory changes are seen involving the body and tail, consistent with acute pancreatitis. No evidence of pancreatic soft tissue mass or pseudocysts. No evidence of biliary ductal dilatation.  The liver, spleen, adrenal glands, and kidneys are normal in appearance. No evidence  of hydronephrosis. No evidence of lymphadenopathy within the abdomen or pelvis.  Normal appendix visualized. No evidence of bowel wall thickening or obstruction. No abnormal fluid collections seen. No suspicious bone lesions identified.  IMPRESSION: Mild-to-moderate acute pancreatitis. No evidence of pancreatic necrosis, pseudocyst formation, or other complication.  Stable postop changes from previous Whipple procedure and gastrojejunostomy. No evidence of recurrent or metastatic carcinoma.   Electronically Signed   By: Earle Gell M.D.   On: 06/10/2014 23:29    Scheduled Meds: . enoxaparin (LOVENOX) injection  40 mg Subcutaneous Q24H  . insulin aspart  0-9 Units Subcutaneous Q4H  . metoprolol succinate  100 mg Oral Daily  . pantoprazole (PROTONIX) IV  40 mg Intravenous Q24H  . [START ON 06/13/2014] pneumococcal 23 valent vaccine  0.5 mL Intramuscular  Tomorrow-1000   Continuous Infusions: . dextrose 5 % and 0.9% NaCl 100 mL/hr at 06/12/14 0840    Principal Problem:   Acute pancreatitis Active Problems:   Diabetes mellitus   HTN (hypertension)   Headache   Patrici Ranks, NP-C [place]  pgr [phone]   [facility] Pager [phone]. If 7PM-7AM, please contact night-coverage at [email or web address], password [facility] 06/12/2014, 1:15 PM  LOS: 2 days

## 2014-06-13 LAB — CBC
HCT: 37.4 % — ABNORMAL LOW (ref 39.0–52.0)
Hemoglobin: 12.1 g/dL — ABNORMAL LOW (ref 13.0–17.0)
MCH: 27.6 pg (ref 26.0–34.0)
MCHC: 32.4 g/dL (ref 30.0–36.0)
MCV: 85.2 fL (ref 78.0–100.0)
PLATELETS: 123 10*3/uL — AB (ref 150–400)
RBC: 4.39 MIL/uL (ref 4.22–5.81)
RDW: 13 % (ref 11.5–15.5)
WBC: 5.2 10*3/uL (ref 4.0–10.5)

## 2014-06-13 LAB — LIPASE, BLOOD: Lipase: 25 U/L (ref 11–59)

## 2014-06-13 LAB — GLUCOSE, CAPILLARY
GLUCOSE-CAPILLARY: 193 mg/dL — AB (ref 70–99)
Glucose-Capillary: 130 mg/dL — ABNORMAL HIGH (ref 70–99)

## 2014-06-13 MED ORDER — PANTOPRAZOLE SODIUM 40 MG PO TBEC: 40.0000 mg | DELAYED_RELEASE_TABLET | Freq: Every day | ORAL | Status: DC

## 2014-06-13 NOTE — Progress Notes (Signed)
Patient discharge teaching given, including activity, diet, follow-up appoints, and medications. Patient verbalized understanding of all discharge instructions. IV access was d/c'd. Vitals are stable. Skin is intact except as charted in most recent assessments. Pt to be escorted out by NT, to be driven home by family. 

## 2014-06-13 NOTE — Discharge Summary (Signed)
Physician Discharge Summary  Hector Pugh KGY:[id number] DOB: 01-19-1961 DOA: 06/10/2014  PCP: Hector Spanish, MD  Admit date: 06/10/2014 Discharge date: 06/13/2014  Time spent: 35 minutes  Recommendations for Outpatient Follow-up:  1. Follow up with PCP in 1-2 weeks 2. Follow up with surgery as scheduled 3. Follow up on MRI of abd to be arranged by surgery  Discharge Diagnoses:  Principal Problem:   Acute pancreatitis Active Problems:   Diabetes mellitus   HTN (hypertension)   Headache   Discharge Condition: Improved  Diet recommendation: Low fat, diabetic  Filed Weights   06/10/14 2016 06/11/14 0057  Weight: 153 lb (69.4 kg) 155 lb 10.3 oz (70.6 kg)    History of present illness:  Please see admit h and p from 7/9 for more details. Briefly, pt presents with acute abd pain, found to have an elevated lipase and admitted for acute pancreatitis.  Hospital Course:  Acute pancreatitis  -TG normal, no gallstones or CBD dilitation on CT, no causative medications  -advance to regular diet  -spoke with Dr. Barry Dienes who follows closely outpt. At discharge will arrange for MRI/MRCP ~4 weeks out to further assess of cause. F/u with surgery after scan  Headache  -CT head negative. No focal deficits on exam. Resolved  Thrombocytopenia, mild  -? Related to acute illness. -d/c'd Lovenox. SCDs.  -Follow as outpatient Htn  -well controlled  -continue IV BB while NPO with prn hydralazine  -resume home meds when able  DM  -cbgs stable. -continue SSI while inpatient -to resume home meds on discharge  Discharge Exam: Filed Vitals:   06/12/14 1320 06/12/14 2112 06/13/14 0419 06/13/14 1016  BP: 145/82 135/75 133/80 162/90  Pulse: 52 56 70 61  Temp: 97.3 F (36.3 C) 97.7 F (36.5 C) 98.4 F (36.9 C)   TempSrc: Oral Oral Oral   Resp: 18 17 20    Height:      Weight:      SpO2: 100% 100% 98%     General: Awake, in nad Cardiovascular: regular, s1, s2 Respiratory: normal resp  effort, no wheezing  Discharge Instructions     Medication List         acetaminophen 500 MG tablet  Commonly known as:  TYLENOL  Take 500 mg by mouth every 4 (four) hours as needed.     CREON 12000 UNITS Cpep capsule  Generic drug:  lipase/protease/amylase  take 2 capsules wuth small meals and snacks; take 3-4 capsules with large/fatty meals     metFORMIN 500 MG 24 hr tablet  Commonly known as:  GLUCOPHAGE-XR  Take 1,000 mg by mouth daily with breakfast.     metoprolol succinate 100 MG 24 hr tablet  Commonly known as:  TOPROL-XL  Take 100 mg by mouth daily before breakfast. Take with or immediately following a meal.     ONETOUCH VERIO test strip  Generic drug:  glucose blood     pregabalin 75 MG capsule  Commonly known as:  LYRICA  Take 75 mg by mouth daily.     traMADol 50 MG tablet  Commonly known as:  ULTRAM  Take 1 tablet (50 mg total) by mouth every 6 (six) hours as needed.       No Known Allergies Follow-up Information   Follow up with Hector Spanish, MD. Schedule an appointment as soon as possible for a visit in 1 week.   Specialty:  Internal Medicine   Contact information:   [address] [place] [address]  Follow up with [facility], MD. (2-4 weeks. You will be scheduled an MRI prior to this follow up visit.)    Specialty:  General Surgery   Contact information:   [address] [place] [address] [phone]        The results of significant diagnostics from this hospitalization (including imaging, microbiology, ancillary and laboratory) are listed below for reference.    Significant Diagnostic Studies: Dg Chest 1 View  06/11/2014   CLINICAL DATA:  Cough.  EXAM: CHEST - 1 VIEW  COMPARISON:  PA and lateral chest 01/22/2014 and 09/21/2010. CT chest 11/10/2012.  FINDINGS: Subsegmental atelectasis is seen in the left lung base. The lungs are otherwise clear. Heart size is normal. There is no pneumothorax or pleural  effusion.  IMPRESSION: No acute findings.  Subsegmental atelectasis left lung base.   Electronically Signed   By: Inge Rise M.D.   On: 06/11/2014 07:07   Ct Head Wo Contrast  06/11/2014   CLINICAL DATA:  Headache  EXAM: CT HEAD WITHOUT CONTRAST  TECHNIQUE: Contiguous axial images were obtained from the base of the skull through the vertex without intravenous contrast.  COMPARISON:  None.  FINDINGS: There is no acute intracranial hemorrhage or infarct. No mass lesion or midline shift. Gray-white matter differentiation is well maintained. Ventricles are normal in size without evidence of hydrocephalus. CSF containing spaces are within normal limits. No extra-axial fluid collection.  The calvarium is intact.  Orbital soft tissues are within normal limits.  There is partial opacification of the left posterior ethmoidal air cells. Paranasal sinuses are otherwise clear. No mastoid effusion.  Scalp soft tissues are unremarkable.  IMPRESSION: 1. Unremarkable head CT with no acute intracranial process identified. 2. Mild left ethmoidal sinus disease.   Electronically Signed   By: Jeannine Boga M.D.   On: 06/11/2014 04:50   Ct Abdomen Pelvis W Contrast  06/10/2014   CLINICAL DATA:  Left upper quadrant pain. Diarrhea. Pancreatitis. Previous Whipple procedure for ampullary carcinoma.  EXAM: CT ABDOMEN AND PELVIS WITH CONTRAST  TECHNIQUE: Multidetector CT imaging of the abdomen and pelvis was performed using the standard protocol following bolus administration of intravenous contrast.  CONTRAST:  116mL OMNIPAQUE IOHEXOL 300 MG/ML  SOLN  COMPARISON:  01/27/2014  FINDINGS: Postop changes again seen from previous Whipple procedure and gastrojejunostomy. Mild diffuse edema and peripancreatic inflammatory changes are seen involving the body and tail, consistent with acute pancreatitis. No evidence of pancreatic soft tissue mass or pseudocysts. No evidence of biliary ductal dilatation.  The liver, spleen, adrenal  glands, and kidneys are normal in appearance. No evidence of hydronephrosis. No evidence of lymphadenopathy within the abdomen or pelvis.  Normal appendix visualized. No evidence of bowel wall thickening or obstruction. No abnormal fluid collections seen. No suspicious bone lesions identified.  IMPRESSION: Mild-to-moderate acute pancreatitis. No evidence of pancreatic necrosis, pseudocyst formation, or other complication.  Stable postop changes from previous Whipple procedure and gastrojejunostomy. No evidence of recurrent or metastatic carcinoma.   Electronically Signed   By: Earle Gell M.D.   On: 06/10/2014 23:29    Microbiology: No results found for this or any previous visit (from the past 240 hour(s)).   Labs: Basic Metabolic Panel:  Recent Labs Lab 06/10/14 2100 06/11/14 0550 06/12/14 0742  NA 140 139 140  K 4.2 3.9 4.5  CL 102 101 104  CO2 25 22 22   GLUCOSE 146* 115* 128*  BUN 10 9 11   CREATININE 0.90 0.83 0.87  CALCIUM 9.8 8.6 8.5   Liver Function Tests:  Recent Labs Lab 06/10/14 2100 06/11/14 0550  AST 20 16  ALT 15 12  ALKPHOS 41 32*  BILITOT 0.5 0.8  PROT 7.4 6.4  ALBUMIN 4.6 3.6    Recent Labs Lab 06/10/14 2100 06/13/14 0947  LIPASE 1142* 25   No results found for this basename: AMMONIA,  in the last 168 hours CBC:  Recent Labs Lab 06/10/14 2100 06/11/14 0550 06/12/14 0742 06/13/14 0506  WBC 10.5 11.1* 4.4 5.2  NEUTROABS 8.9* 8.2*  --   --   HGB 13.9 12.3* 12.1* 12.1*  HCT 41.8 37.6* 37.6* 37.4*  MCV 85.0 84.5 85.5 85.2  PLT 149* 148* 116* 123*   Cardiac Enzymes: No results found for this basename: CKTOTAL, CKMB, CKMBINDEX, TROPONINI,  in the last 168 hours BNP: BNP (last 3 results) No results found for this basename: PROBNP,  in the last 8760 hours CBG:  Recent Labs Lab 06/12/14 0754 06/12/14 1152 06/12/14 1649 06/12/14 2127 06/13/14 0813  GLUCAP 130* 133* 98 124* 193*   Signed:  Claus Silvestro K  [facility] 06/13/2014, 11:55 AM

## 2014-06-13 NOTE — Discharge Instructions (Signed)
Low-Fat Diet for Pancreatitis or Gallbladder Conditions A low-fat diet can be helpful if you have pancreatitis or a gallbladder condition. With these conditions, your pancreas and gallbladder have trouble digesting fats. A healthy eating plan with less fat will help rest your pancreas and gallbladder and reduce your symptoms. WHAT DO I NEED TO KNOW ABOUT THIS DIET?  Eat a low-fat diet.  Reduce your fat intake to less than 20-30% of your total daily calories. This is less than 50-60 g of fat per day.  Remember that you need some fat in your diet. Ask your dietician what your daily goal should be.  Choose nonfat and low-fat healthy foods. Look for the words "nonfat," "low fat," or "fat free."  As a guide, look on the label and choose foods with less than 3 g of fat per serving. Eat only one serving.  Avoid alcohol.  Do not smoke. If you need help quitting, talk with your health care provider.  Eat small frequent meals instead of three large heavy meals. WHAT FOODS CAN I EAT? Grains Include healthy grains and starches such as potatoes, wheat bread, fiber-rich cereal, and brown rice. Choose whole grain options whenever possible. In adults, whole grains should account for 45-65% of your daily calories.  Fruits and Vegetables Eat plenty of fruits and vegetables. Fresh fruits and vegetables add fiber to your diet. Meats and Other Protein Sources Eat lean meat such as chicken and pork. Trim any fat off of meat before cooking it. Eggs, fish, and beans are other sources of protein. In adults, these foods should account for 10-35% of your daily calories. Dairy Choose low-fat milk and dairy options. Dairy includes fat and protein, as well as calcium.  Fats and Oils Limit high-fat foods such as fried foods, sweets, baked goods, sugary drinks.  Other Creamy sauces and condiments, such as mayonnaise, can add extra fat. Think about whether or not you need to use them, or use smaller amounts or low fat  options. WHAT FOODS ARE NOT RECOMMENDED?  High fat foods, such as:  [employer].  Ice cream.  [place] toast.  Sweet rolls.  Pizza.  Cheese bread.  Foods covered with batter, butter, creamy sauces, or cheese.  Fried foods.  Sugary drinks and desserts.  Foods that cause gas or bloating Document Released: 11/25/2013 Document Reviewed: 11/03/2013 [facility] Patient Information 2015 [place], [place]. This information is not intended to replace advice given to you by your health care provider. Make sure you discuss any questions you have with your health care provider.

## 2014-06-15 ENCOUNTER — Telehealth (INDEPENDENT_AMBULATORY_CARE_PROVIDER_SITE_OTHER): Payer: Self-pay | Admitting: *Deleted

## 2014-06-15 NOTE — Telephone Encounter (Signed)
LMOM for pt regarding MR Abdomen.  Advise pt it is scheduled for 06-30-14 at [facility] at 7:45 a.m.  Let pt know NPO 4 hours prior to his test.  Hector Pugh

## 2014-06-15 NOTE — Telephone Encounter (Signed)
Informed pt of message below

## 2014-06-30 ENCOUNTER — Other Ambulatory Visit (INDEPENDENT_AMBULATORY_CARE_PROVIDER_SITE_OTHER): Payer: Self-pay | Admitting: General Surgery

## 2014-06-30 ENCOUNTER — Telehealth (INDEPENDENT_AMBULATORY_CARE_PROVIDER_SITE_OTHER): Payer: Self-pay

## 2014-06-30 ENCOUNTER — Ambulatory Visit (HOSPITAL_COMMUNITY)
Admission: RE | Admit: 2014-06-30 | Discharge: 2014-06-30 | Disposition: A | Discharge status: Home / Self Care | Payer: 59 | Source: Ambulatory Visit | Attending: General Surgery | Admitting: General Surgery

## 2014-06-30 DIAGNOSIS — Z9089 Acquired absence of other organs: Secondary | ICD-10-CM | POA: Diagnosis not present

## 2014-06-30 DIAGNOSIS — C241 Malignant neoplasm of ampulla of Vater: Secondary | ICD-10-CM | POA: Insufficient documentation

## 2014-06-30 MED ORDER — GADOBENATE DIMEGLUMINE 529 MG/ML IV SOLN
14.0000 mL | Freq: Once | INTRAVENOUS | Status: AC | PRN
  Administered 2014-06-30: 14 mL via INTRAVENOUS

## 2014-06-30 NOTE — Telephone Encounter (Addendum)
MRI results show no recurrent cancer and no obstruction of pancreatic duct.

## 2014-06-30 NOTE — Progress Notes (Signed)
Quick Note:  Please tell patient that MRI looks good. No evidence of recurrent cancer or pancreatic duct obstruction ______

## 2014-06-30 NOTE — Telephone Encounter (Signed)
Pt returned call. I gave him the message below from [place] that the MRI results show no recurrent cancer and no obstruction of pancreatic duct. Pt understands. Pt will still keep the appt for Monday.

## 2014-07-06 ENCOUNTER — Ambulatory Visit (INDEPENDENT_AMBULATORY_CARE_PROVIDER_SITE_OTHER): Payer: 59 | Admitting: General Surgery

## 2014-07-06 ENCOUNTER — Encounter (INDEPENDENT_AMBULATORY_CARE_PROVIDER_SITE_OTHER): Payer: Self-pay | Admitting: General Surgery

## 2014-07-06 VITALS — BP 144/84 | HR 60 | Temp 97.3°F | Resp 16 | Ht 62.0 in | Wt 155.0 lb

## 2014-07-06 DIAGNOSIS — K859 Acute pancreatitis without necrosis or infection, unspecified: Secondary | ICD-10-CM

## 2014-07-06 DIAGNOSIS — K8500 Idiopathic acute pancreatitis without necrosis or infection: Secondary | ICD-10-CM

## 2014-07-06 NOTE — Progress Notes (Signed)
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  He is doing well.  He had to be admitted in the last month for pancreatitis. There was no clear cause for the inflammation.  This resolved with supportive care quickly.  I had him go for MRCP to rule out anatomic issues for the pancreatitis.  After the episode, he got switched from metformin to glipizide.    PERTINENT REVIEW OF SYSTEMS: Otherwise negative x 11  Filed Vitals:   07/06/14 0919  BP: 144/84  Pulse: 60  Temp: 97.3 F (36.3 C)  Resp: 16   Filed Weights   07/06/14 0919  Weight: 155 lb (70.308 kg)    EXAM: No change Head: Normocephalic and atraumatic.  Eyes:  Conjunctivae are normal. Pupils are equal, round, and reactive to light. No scleral icterus.   Resp: No respiratory distress, normal effort. Abd:  Abdomen is soft, non distended and non tender. No masses are palpable.  There is no rebound and no guarding. No hernias. Midline scar tender.   Neurological: Alert and oriented to person, place, and time. Coordination normal.  Skin: Skin is warm and dry. No rash noted. No diaphoretic. No erythema. No pallor.  Psychiatric: Normal mood and affect. Normal behavior. Judgment and thought content normal.      ASSESSMENT AND PLAN:   Acute pancreatitis Unclear source of pancreatitis, but now resolved.    MRCP negative for any evidence of pancreatic ductal obstruction which, if present, would make me concerned for recurrence.    Continue diabetic diet.  Continue avoiding alcohol.  Follow up in 6 months.         Milus Height, MD Surgical Oncology, [facility]  [name], MD [name], MD

## 2014-07-06 NOTE — Patient Instructions (Signed)
Follow up in 6 months 

## 2014-07-06 NOTE — Assessment & Plan Note (Signed)
Unclear source of pancreatitis, but now resolved.    MRCP negative for any evidence of pancreatic ductal obstruction which, if present, would make me concerned for recurrence.    Continue diabetic diet.  Continue avoiding alcohol.  Follow up in 6 months.

## 2014-09-21 ENCOUNTER — Telehealth: Payer: Self-pay | Admitting: Oncology

## 2014-09-21 NOTE — Telephone Encounter (Signed)
Per 10/19 POF, r/s 11/02 to 11/06, mailed sch to pt .Marland Kitchen... KJ

## 2014-10-05 ENCOUNTER — Ambulatory Visit: Payer: 59 | Admitting: Oncology

## 2014-10-05 ENCOUNTER — Other Ambulatory Visit: Payer: 59

## 2014-10-09 ENCOUNTER — Other Ambulatory Visit: Payer: 59

## 2014-10-09 ENCOUNTER — Ambulatory Visit (HOSPITAL_BASED_OUTPATIENT_CLINIC_OR_DEPARTMENT_OTHER): Payer: 59 | Admitting: Oncology

## 2014-10-09 ENCOUNTER — Other Ambulatory Visit (HOSPITAL_BASED_OUTPATIENT_CLINIC_OR_DEPARTMENT_OTHER): Payer: 59

## 2014-10-09 ENCOUNTER — Telehealth: Payer: Self-pay | Admitting: Oncology

## 2014-10-09 ENCOUNTER — Ambulatory Visit: Payer: 59 | Admitting: Oncology

## 2014-10-09 VITALS — BP 141/86 | HR 57 | Temp 98.7°F | Resp 18 | Ht 62.0 in | Wt 163.8 lb

## 2014-10-09 DIAGNOSIS — C241 Malignant neoplasm of ampulla of Vater: Secondary | ICD-10-CM

## 2014-10-09 LAB — CANCER ANTIGEN 19-9: CA 19-9: 25.7 U/mL (ref <35.0)

## 2014-10-09 NOTE — Patient Instructions (Signed)
[facility] Discharge Instructions  RECOMMENDATIONS MADE BY THE CONSULTANT AND ANY TEST RESULTS WILL BE SENT TO YOUR REFERRING PHYSICIAN.  MEDICATIONS PRESCRIBED:    INSTRUCTIONS GIVEN AND DISCUSSED:   SPECIAL INSTRUCTIONS/FOLLOW-UP: Please provide the office with a copy of your Advanced Directive at next opportunity to scan into your record. Thank you.  Thank you for choosing [facility] to provide your oncology and hematology care.  To afford each patient quality time with our providers, please arrive at least 30 minutes before your scheduled appointment time.  With your help, our goal is to use those 30 minutes to complete the necessary work-up to ensure our physicians have the information they need to help with your evaluation and healthcare recommendations.     ___________________  Should you have questions after your visit to [facility], please contact our office at [phone] between the hours of 8:30 a.m. and 4:30 p.m.  Voicemails left after 4:00 p.m. will not be returned until the following business day.  For prescription refill requests, have your pharmacy contact our office with your prescription refill request. We request 24 hour notice for all refill requests.

## 2014-10-09 NOTE — Progress Notes (Signed)
  [place] OFFICE PROGRESS NOTE   Diagnosis: ampullary carcinoma  INTERVAL HISTORY:   He was admitted with acute abdominal pain in July. He was diagnosed with pancreatitis.a CT showed no evidence of recurrent carcinoma. An MRI 06/30/2014 revealed no evidence of metastatic disease. The previously noted pancreatitis had resolved. He denies recurrent abdominal pain. He feels well. Good appetite. No diarrhea. He currently has a "sinus "headache. He saw his primary physician yesterday and reports plain x-rays were negative.  Objective:  Vital signs in last 24 hours:  There were no vitals taken for this visit.    HEENT: Neck without mass Lymphatics: no cervical, supra-clavicular, axillary, or inguinal nodes Resp: lungs clear bilaterally Cardio: regular rate and rhythm GI: no hepatosplenomegaly, no mass, tender to superficial palpation surrounding the midline and right abdominal scar. Vascular: no leg edema  Lab Results:   Imaging:  No results found.  Medications: I have reviewed the patient's current medications.  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 Obstructive jaundice secondary to #1, status post placement of a bile duct stent on 11/08/2013  3. Post ERCP pancreatitis  4. Elevated CA 19-9 on 11/11/2012, persistent mild elevation  5. Small posterior cervical lymph nodes on exam to 01/09/2013  6. Acute pancreatitis July 2015    Disposition:  He remains in clinical  remission from ampullary carcinoma. We will follow-up on the CA 19-9 from today. He will return for an office and lab visit in 6 months. He will schedule an appointment with Dr. Barry Dienes for 3 months.  Betsy Coder, MD  10/09/2014  7:34 AM

## 2014-10-09 NOTE — Telephone Encounter (Signed)
gv pt appt schedule for may 2016.  °

## 2014-10-13 ENCOUNTER — Encounter (INDEPENDENT_AMBULATORY_CARE_PROVIDER_SITE_OTHER): Payer: Self-pay | Admitting: General Surgery

## 2015-04-16 ENCOUNTER — Encounter: Payer: Self-pay | Admitting: Oncology

## 2015-04-16 ENCOUNTER — Ambulatory Visit (HOSPITAL_BASED_OUTPATIENT_CLINIC_OR_DEPARTMENT_OTHER): Payer: BLUE CROSS/BLUE SHIELD | Admitting: Oncology

## 2015-04-16 ENCOUNTER — Other Ambulatory Visit (HOSPITAL_BASED_OUTPATIENT_CLINIC_OR_DEPARTMENT_OTHER): Payer: BLUE CROSS/BLUE SHIELD

## 2015-04-16 VITALS — BP 154/71 | HR 49 | Temp 97.8°F | Resp 20 | Ht 62.0 in | Wt 166.1 lb

## 2015-04-16 DIAGNOSIS — C241 Malignant neoplasm of ampulla of Vater: Secondary | ICD-10-CM | POA: Diagnosis not present

## 2015-04-16 NOTE — Progress Notes (Signed)
  [facility] OFFICE PROGRESS NOTE   Diagnosis: Ampullary carcinoma  INTERVAL HISTORY:   [name] returns as scheduled. He feels well. Good appetite. No difficulty with bowel function. No diarrhea. He is working.  Objective:  Vital signs in last 24 hours:  Blood pressure 154/71, pulse 49, temperature 97.8 F (36.6 C), temperature source Oral, resp. rate 20, height 5\' 2"  (1.575 m), weight 166 lb 1.6 oz (75.342 kg), SpO2 100 %.    HEENT: Neck without mass Lymphatics: No cervical, supraclavicular, axillary, or inguinal nodes Resp: Lungs clear bilaterally Cardio: Regular rate and rhythm GI: No hepatosplenomegaly, nontender, no mass Vascular: No leg edema   Lab Results:  Lab Results  Component Value Date   WBC 5.2 06/13/2014   HGB 12.1* 06/13/2014   HCT 37.4* 06/13/2014   MCV 85.2 06/13/2014   PLT 123* 06/13/2014   NEUTROABS 8.2* 06/11/2014    CA 19-9 on 10/09/2014-25.7 Medications: I have reviewed the patient's current medications.  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 Obstructive jaundice secondary to #1, status post placement of a bile duct stent on 11/08/2013  3. Post ERCP pancreatitis  4. Elevated CA 19-9 on 11/11/2012 5. Small posterior cervical lymph nodes on exam  01/09/2013  6. Acute pancreatitis July 2015    Disposition:  [name] remains in clinical remission from ampullary carcinoma. We will follow-up on the CA 19-9 from today. He will return for  an office visit in 6 months.  Betsy Coder, MD  04/16/2015  8:26 AM

## 2015-04-19 ENCOUNTER — Telehealth: Payer: Self-pay | Admitting: Oncology

## 2015-04-19 LAB — CANCER ANTIGEN 19-9: CA 19 9: 33.1 U/mL (ref <35.0)

## 2015-04-19 NOTE — Telephone Encounter (Signed)
Called patient with 11/7 appointment

## 2015-04-21 ENCOUNTER — Telehealth: Payer: Self-pay | Admitting: *Deleted

## 2015-04-21 NOTE — Telephone Encounter (Signed)
Attempted to call pt with CA19-9 results. No answer, no voicemail. Noted pt viewed result on MyChart.

## 2015-04-21 NOTE — Telephone Encounter (Signed)
-----   Message from Ladell Pier, MD sent at 04/19/2015  2:38 PM EDT ----- Please call patient, ca19-9 is normal

## 2015-10-11 ENCOUNTER — Ambulatory Visit (HOSPITAL_BASED_OUTPATIENT_CLINIC_OR_DEPARTMENT_OTHER): Payer: BLUE CROSS/BLUE SHIELD | Admitting: Oncology

## 2015-10-11 ENCOUNTER — Other Ambulatory Visit: Payer: BLUE CROSS/BLUE SHIELD

## 2015-10-11 ENCOUNTER — Telehealth: Payer: Self-pay | Admitting: Oncology

## 2015-10-11 VITALS — BP 135/81 | HR 55 | Temp 98.7°F | Resp 16 | Ht 62.0 in | Wt 165.7 lb

## 2015-10-11 DIAGNOSIS — C241 Malignant neoplasm of ampulla of Vater: Secondary | ICD-10-CM | POA: Diagnosis not present

## 2015-10-11 LAB — CANCER ANTIGEN 19-9: CA 19-9: 36.3 U/mL — ABNORMAL HIGH (ref <35.0)

## 2015-10-11 NOTE — Progress Notes (Signed)
  [place] OFFICE PROGRESS NOTE   Diagnosis: Ampullary carcinoma  INTERVAL HISTORY:   Mr. Casalino returns as scheduled. He feels well. Good appetite and energy level. He has an occasional abdominal pain, but this is not consistent. No other complaint. No diarrhea.  Objective:  Vital signs in last 24 hours:  Blood pressure 135/81, pulse 55, temperature 98.7 F (37.1 C), temperature source Oral, resp. rate 16, height 5\' 2"  (1.575 m), weight 165 lb 11.2 oz (75.161 kg), SpO2 100 %.    HEENT: Neck without mass Lymphatics: No cervical, supra-clavicular, axillary, or inguinal nodes Resp: Lungs clear bilaterally Cardio: Regular rate and rhythm GI: No hepatomegaly, no mass, nontender Vascular: No leg edema   Lab Results:  CA 19-9 on 04/16/2015: 33.1  Medications: I have reviewed the patient's current medications.  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 Obstructive jaundice secondary to #1, status post placement of a bile duct stent on 11/08/2013  3. Post ERCP pancreatitis  4. Elevated CA 19-9 on 11/11/2012 5. Small posterior cervical lymph nodes on exam 01/09/2013 , not noted on exam 10/11/2015 6. Acute pancreatitis July 2015    Disposition:  Hector Pugh remains in clinical remission from anti-allergic carcinoma. We will follow-up on the CA 19-9 from today. He will return for an office visit in 6 months. He plans to schedule an appointment with Dr. Barry Dienes.  Betsy Coder,  MD  10/11/2015  8:39 AM

## 2015-10-11 NOTE — Telephone Encounter (Signed)
Gave and printed appt sched and avs fo rpt for may 2017 °

## 2015-10-12 ENCOUNTER — Telehealth: Payer: Self-pay | Admitting: *Deleted

## 2015-10-12 ENCOUNTER — Telehealth: Payer: Self-pay | Admitting: Oncology

## 2015-10-12 DIAGNOSIS — C241 Malignant neoplasm of ampulla of Vater: Secondary | ICD-10-CM

## 2015-10-12 NOTE — Telephone Encounter (Signed)
Left message for patient re lab 01/12/16 and also confirmed May 2017 appointments. Schedule mailed.

## 2015-10-12 NOTE — Telephone Encounter (Signed)
-----   Message from Ladell Pier, MD sent at 10/11/2015  7:20 PM EST ----- Please call patient, ca19-9 is at upper normal, repeat 3 months

## 2015-10-12 NOTE — Telephone Encounter (Signed)
Per Dr. Benay Spice; notified pt that ca19-9 is at upper normal; MD wants to repeat in 3 months.  Pt verbalized understanding and expressed appreciation for call.

## 2016-01-12 ENCOUNTER — Other Ambulatory Visit (HOSPITAL_BASED_OUTPATIENT_CLINIC_OR_DEPARTMENT_OTHER): Payer: BLUE CROSS/BLUE SHIELD

## 2016-01-12 ENCOUNTER — Other Ambulatory Visit: Payer: BLUE CROSS/BLUE SHIELD

## 2016-01-12 DIAGNOSIS — C241 Malignant neoplasm of ampulla of Vater: Secondary | ICD-10-CM | POA: Diagnosis not present

## 2016-01-13 LAB — CANCER ANTIGEN 19-9: CA 19-9: 21 U/mL (ref 0–35)

## 2016-01-13 LAB — CANCER ANTIGEN 19-9 (PARALLEL TESTING): CA 19-9: 39 U/mL — ABNORMAL HIGH (ref <34)

## 2016-01-14 ENCOUNTER — Telehealth: Payer: Self-pay | Admitting: *Deleted

## 2016-01-14 NOTE — Telephone Encounter (Signed)
Per Dr. Benay Spice; notified pt that ca19-9 is stable, f/u as scheduled.  Pt verbalized understanding and confirmed appt for 04/10/16.

## 2016-01-14 NOTE — Telephone Encounter (Signed)
-----   Message from Ladell Pier, MD sent at 01/13/2016  6:37 PM EST ----- Please call patient, ca19-9 is stable, f/u as scheduled

## 2016-01-22 ENCOUNTER — Encounter: Payer: Self-pay | Admitting: Oncology

## 2016-02-01 ENCOUNTER — Telehealth: Payer: Self-pay | Admitting: *Deleted

## 2016-02-18 NOTE — Telephone Encounter (Signed)
Error in opening chart

## 2016-04-10 ENCOUNTER — Other Ambulatory Visit: Payer: BLUE CROSS/BLUE SHIELD

## 2016-04-10 ENCOUNTER — Telehealth: Payer: Self-pay | Admitting: Oncology

## 2016-04-10 ENCOUNTER — Ambulatory Visit: Payer: BLUE CROSS/BLUE SHIELD | Admitting: Oncology

## 2016-04-10 ENCOUNTER — Ambulatory Visit (HOSPITAL_BASED_OUTPATIENT_CLINIC_OR_DEPARTMENT_OTHER): Payer: BLUE CROSS/BLUE SHIELD | Admitting: Oncology

## 2016-04-10 VITALS — BP 133/64 | HR 59 | Temp 97.8°F | Resp 20 | Ht 62.0 in | Wt 164.1 lb

## 2016-04-10 DIAGNOSIS — C241 Malignant neoplasm of ampulla of Vater: Secondary | ICD-10-CM

## 2016-04-10 NOTE — Telephone Encounter (Signed)
per pof to sch pt appt-gave pt copy of avs °

## 2016-04-10 NOTE — Progress Notes (Signed)
  [facility] OFFICE PROGRESS NOTE   Diagnosis: Ampullary carcinoma  INTERVAL HISTORY:   Mr. Painton returns as scheduled. He currently has a cold. He otherwise feels well. No abdominal pain.  Objective:  Vital signs in last 24 hours:  Blood pressure 133/64, pulse 59, temperature 97.8 F (36.6 C), temperature source Oral, resp. rate 20, height 5\' 2"  (1.575 m), weight 164 lb 1.6 oz (74.435 kg), SpO2 100 %.    HEENT: Neck without mass Lymphatics: No cervical, supraclavicular, axillary, or inguinal nodes Resp: Lungs clear bilaterally Cardio: Regular rate and rhythm GI: No hepatomegaly, no mass, nontender Vascular: No leg edema  Lab Results:  Lab Results  Component Value Date   WBC 5.2 06/13/2014   HGB 12.1* 06/13/2014   HCT 37.4* 06/13/2014   MCV 85.2 06/13/2014   PLT 123* 06/13/2014   NEUTROABS 8.2* 06/11/2014    CA 19-9 on 01/11/1999 17-39 ([facility]), 21 at [facility]  Medications: I have reviewed the patient's current medications.  Assessment/Plan: 1. 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 Obstructive jaundice secondary to #1, status post placement of a bile duct stent on 11/08/2013  3. Post ERCP pancreatitis  4. Elevated CA 19-9 on 11/11/2012 5. Small posterior cervical lymph nodes on exam 01/09/2013 , not noted on exam 10/11/2015 6. Acute pancreatitis July 2015    Disposition:  Mr. Latimer appears stable. No clinical evidence for recurrence of the ampullary carcinoma. We will  follow-up on the CA 19-9 from today. He will return for an office visit in 6 months.  Betsy Coder, MD  04/10/2016  8:48 AM

## 2016-04-11 ENCOUNTER — Telehealth: Payer: Self-pay | Admitting: *Deleted

## 2016-04-11 LAB — CANCER ANTIGEN 19-9: CAN 19-9: 19 U/mL (ref 0–35)

## 2016-04-11 LAB — CANCER ANTIGEN 19-9 (PARALLEL TESTING): CA 19 9: 34 U/mL — AB (ref <34)

## 2016-04-11 NOTE — Telephone Encounter (Signed)
Per Dr. Benay Spice, pt notified that ca19-9 is stable.  Pt is appreciative of call and has no questions at this time.

## 2016-04-11 NOTE — Telephone Encounter (Signed)
-----   Message from Ladell Pier, MD sent at 04/11/2016  4:10 PM EDT ----- Please call patient, ca19-9 is stable, f/u as scheduled

## 2016-04-25 ENCOUNTER — Ambulatory Visit: Payer: BLUE CROSS/BLUE SHIELD | Admitting: Oncology

## 2016-04-25 ENCOUNTER — Other Ambulatory Visit: Payer: BLUE CROSS/BLUE SHIELD

## 2016-06-15 ENCOUNTER — Ambulatory Visit
Admission: RE | Admit: 2016-06-15 | Discharge: 2016-06-15 | Disposition: A | Discharge status: Home / Self Care | Payer: BLUE CROSS/BLUE SHIELD | Source: Ambulatory Visit | Attending: General Surgery | Admitting: General Surgery

## 2016-06-15 ENCOUNTER — Other Ambulatory Visit: Payer: Self-pay | Admitting: General Surgery

## 2016-06-15 DIAGNOSIS — R109 Unspecified abdominal pain: Secondary | ICD-10-CM

## 2016-06-15 MED ORDER — IOPAMIDOL (ISOVUE-300) INJECTION 61%
100.0000 mL | Freq: Once | INTRAVENOUS | Status: AC | PRN
  Administered 2016-06-15: 100 mL via INTRAVENOUS

## 2016-07-11 ENCOUNTER — Encounter: Payer: Self-pay | Admitting: Physical Therapy

## 2016-07-11 ENCOUNTER — Ambulatory Visit: Payer: BLUE CROSS/BLUE SHIELD | Attending: General Surgery | Admitting: Physical Therapy

## 2016-07-11 DIAGNOSIS — M62838 Other muscle spasm: Secondary | ICD-10-CM | POA: Insufficient documentation

## 2016-07-11 DIAGNOSIS — M6281 Muscle weakness (generalized): Secondary | ICD-10-CM | POA: Insufficient documentation

## 2016-07-11 NOTE — Patient Instructions (Signed)
Scar Massage  Scar massage is done to improve the mobility of scar, decrease scar tissue from building up, reduce adhesions, and prevent Keloids from forming. Start scar massage after scabs have fallen off by themselves and no open areas. The first few weeks after surgery, it is normal for a scar to appear pink or red and slightly raised. Scars can itch or have areas of numbness. Some scars may be sensitive.   Direct Scar massage: after scar is healed, no opening, no scab 1.  Place pads of two fingers together directly on the scar starting at one end of the scar. Move the fingers up and down across the scar holding 5 seconds one direction.  Then go opposite direction hold 5 seconds.  2. Move over to the next section of the scar and repeat.  Work your way along the entire length of the scar.   3. Next make diagonal movements along the scar holding 5 seconds at one direction. 4. Next movement is side to side. 5. Do not rub fingers over the scar.  Instead keep firm pressure and move scar over the tissue it is on top   Scar Lift and Roll 12 weeks after surgery. 1. Pinch a small amount of the scar between your first two fingers and thumb.  2. Roll the scar between your fingers for 5 to 15 seconds. 3. Move along the scar and repeat until you have massaged the entire length of scar.   Stop the massage and call your doctor if you notice: 1. Increased redness 2. Bleeding from scar 3. Seepage coming from the scar 4. Scar is warmer and has increased pain   [facility] [address], [address] [place], [place] [address] Phone # [phone] Fax [phone]  

## 2016-07-11 NOTE — Therapy (Signed)
[facility] [address] [place], [place], [address] Phone: [phone]   Fax:  [phone]  Physical Therapy Evaluation  Patient Details  Name: Hector Pugh MRN: [id number] Date of Birth: 14-Dec-1960 Referring Provider: Dr. Stark Klein  Encounter Date: 07/11/2016      PT End of Session - 07/11/16 0922    Visit Number 1   Date for PT Re-Evaluation 09/10/16   PT Start Time 0845   PT Stop Time 0925   PT Time Calculation (min) 40 min   Activity Tolerance Patient tolerated treatment well   Behavior During Therapy [facility] for tasks assessed/performed      Past Medical History:  Diagnosis Date  . 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 Cancer ([place])   . Diabetes mellitus ([place]) 11/10/2012  . Diabetes mellitus without complication (HCC)    diet controlled  . HTN (hypertension) 11/10/2012  . Hypertension   . 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 Pancreatic cancer ([place])   . Pancreatitis 11/10/2012  . Seasonal allergies     Past Surgical History:  Procedure Laterality Date  . circucision    . circumsision    . ERCP  11/08/2012   Procedure: ENDOSCOPIC RETROGRADE CHOLANGIOPANCREATOGRAPHY (ERCP);  Surgeon: Jeryl Columbia, MD;  Location: [facility];  Service: Gastroenterology;  Laterality: N/A;  . EUS  11/08/2012   Procedure: UPPER ENDOSCOPIC ULTRASOUND (EUS) LINEAR;  Surgeon: Arta Silence, MD;  [facility];  Service: Endoscopy;  Laterality: N/A;  . FINE NEEDLE ASPIRATION  11/08/2012   Procedure: FINE NEEDLE ASPIRATION (FNA) LINEAR;  Surgeon: Arta Silence, MD;  [facility];  Service: Endoscopy;  Laterality: N/A;  . HERNIA REPAIR     RIGHT  . LAPAROSCOPY  12/12/2012   Procedure: LAPAROSCOPY DIAGNOSTIC;  Surgeon: Stark Klein, MD;  Location: [facility];  Service: General;  Laterality: N/A;  . Left Foot Surgery    . Left Foot Surgery    . VASECTOMY    . WHIPPLE  PROCEDURE  12/12/2012   Procedure: WHIPPLE PROCEDURE;  Surgeon: Stark Klein, MD;  [facility];  Service: General;  Laterality: N/A;    There were no vitals filed for this visit.       Subjective Assessment - 07/11/16 0849    Subjective I had back problem prior to surgery.  I have a pain in the right upper abdominal area since surgery.  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  CAT scan was negative   Limitations Lifting   How long can you sit comfortably? when have pain sitting will hurt   Patient Stated Goals reduce pain in abdomen   Currently in Pain? Yes   Pain Score 6    Pain Location Abdomen   Pain Orientation Right;Upper   Pain Descriptors / Indicators Sore   Pain Type Chronic pain   Pain Onset More than a month ago   Pain Frequency Intermittent   Aggravating Factors  pressure on area; lifting after the next day   Pain Relieving Factors tramadol   Multiple Pain Sites No            [facility] PT Assessment - 07/11/16 0001      Assessment   Medical Diagnosis R10.9 unspecified abdominal pain   Referring Provider Dr. Stark Klein   Prior Therapy for back     Precautions   Precautions Other (comment)   Precaution Comments cancer precautions  Restrictions   Weight Bearing Restrictions No     Balance Screen   Has the patient fallen in the past 6 months No   Has the patient had a decrease in activity level because of a fear of falling?  No   Is the patient reluctant to leave their home because of a fear of falling?  No     Home Ecologist residence     Prior Function   Level of Independence Independent   Vocation Full time employment   Vocation Requirements standing, sitting, lifting   Leisure walk on weekends     Observation/Other Assessments   Focus on Therapeutic Outcomes (FOTO)  37% limitation  27% limitation     Posture/Postural Control   Posture/Postural Control Postural limitations   Postural Limitations Forward  head;Rounded Shoulders;Flexed trunk     ROM / Strength   AROM / PROM / Strength AROM;Strength     AROM   Lumbar Extension decreased by 25%   Lumbar - Left Side Bend decreased by 25% wiht pulling on right lumbar     Strength   Overall Strength Comments abdominal strength is 2/5; bil. hip strength is 4/5     Palpation   Palpation comment thickness of scar on abdomen; thichness of the right diaphgram, upper abdomen, tenderness located in right abdomnal area                   [facility] Adult PT Treatment/Exercise - 07/11/16 0001      Manual Therapy   Manual Therapy Soft tissue mobilization   Soft tissue mobilization scar massage to abdomen, soft tissue work to right diaphgram, right abdominal wall, right lateral and posterior abdominal wall                PT Education - 07/11/16 0921    Education provided Yes   Education Details scar massage   Person(s) Educated Patient   Methods Explanation;Demonstration;Verbal cues;Handout   Comprehension Returned demonstration;Verbalized understanding          PT Short Term Goals - 07/11/16 0933      PT SHORT TERM GOAL #1   Title independent with initial HEP   Time 1   Period Months   Status New     PT SHORT TERM GOAL #2   Title gas pain after eating decreased >/= 25% due to improve mobility of tissue   Time 1   Period Months   Status New     PT SHORT TERM GOAL #3   Title lifting with >/= 25% decreased in pain   Time 1   Period Months   Status New           PT Long Term Goals - 07/11/16 [id number]      PT LONG TERM GOAL #1   Title independent with HEP   Time 2   Period Months   Status New     PT LONG TERM GOAL #2   Title reduction in abdominal pain with lifitng the following day   Time 3   Period Months   Status New     PT LONG TERM GOAL #3   Title FOTO score </= 27% limitation   Time 2   Period Months   Status New     PT LONG TERM GOAL #4   Title ability to do heavy work with pain decreased >/=  75%   Time 2   Period Months   Status New  PT LONG TERM GOAL #5   Title after eating the gas pain decreased >/= 50% due to improve mobiity of intestines   Time 2   Period Months   Status New               Plan - 07/11/16 0928    Clinical Impression Statement Patient is a 55 year old male with diagnosis of abdominal pain.  Patient has had a whipple surgery on 12/12/2012 and has had abdominal pain at level 5/10 intermittently.  Pain is worse with eating, lifting, and increased activity.  Lumbar extension and left sidebending decreased by 25%. Bilateral hip strength is 4/5 and abdominal strength is 2/5. Abdominal scar is raised and decreased mobility.  Decreased mobilty of right diaphgram and right transvers abdominus.  Patient is of  moderate complexity.  Patient will benefit from skilled therapy to improve strength and tissue mobility to decrease pain.    Rehab Potential Excellent   Clinical Impairments Affecting Rehab Potential None   PT Frequency 1x / week   PT Duration Other (comment)  2 months   PT Treatment/Interventions Cryotherapy;Electrical Stimulation;Therapeutic activities;Therapeutic exercise;Neuromuscular re-education;Patient/family education;Scar mobilization;Manual techniques;Dry needling   PT Next Visit Plan soft tissue work, abdominal bracing, hip flexibility and strength   PT Home Exercise Plan abdominal massage   Recommended Other Services none   Consulted and Agree with Plan of Care Patient      Patient will benefit from skilled therapeutic intervention in order to improve the following deficits and impairments:  Decreased range of motion, Increased fascial restricitons, Pain, Decreased endurance, Decreased activity tolerance, Decreased strength, Decreased mobility  Visit Diagnosis: Other muscle spasm - Plan: PT plan of care cert/re-cert  Muscle weakness (generalized) - Plan: PT plan of care cert/re-cert     Problem List Patient Active Problem List    Diagnosis Date Noted  . Acute pancreatitis 06/11/2014  . Headache 06/11/2014  . 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 Obstructive jaundice 11/11/2012  . Biliary stricture 11/11/2012  . Diabetes mellitus ([place]) 11/10/2012  . HTN (hypertension) 11/10/2012  . Pancreatitis 11/10/2012  . Abdominal pain 11/10/2012    Earlie Counts, PT 07/11/16 9:39 AM   [facility] [address] [place], [place], [address] Phone: [phone]   Fax:  [phone]  Name: Hector Pugh MRN: [id number] Date of Birth: Nov 08, 1961

## 2016-08-09 ENCOUNTER — Encounter: Payer: Self-pay | Admitting: Physical Therapy

## 2016-08-09 ENCOUNTER — Ambulatory Visit: Payer: BLUE CROSS/BLUE SHIELD | Attending: General Surgery | Admitting: Physical Therapy

## 2016-08-09 DIAGNOSIS — M62838 Other muscle spasm: Secondary | ICD-10-CM | POA: Insufficient documentation

## 2016-08-09 DIAGNOSIS — M6281 Muscle weakness (generalized): Secondary | ICD-10-CM | POA: Diagnosis present

## 2016-08-09 NOTE — Patient Instructions (Addendum)
Supine Knee-to-Chest, Unilateral    Lie on back, hands clasped behind one knee. Pull knee in toward chest until a comfortable stretch is felt in lower back and buttocks. Hold _30__ seconds.  Repeat _2__ times per session. Do _1__ sessions per day.  Copyright  VHI. All rights reserved.   Piriformis Stretch, Supine    Lie supine, legs bent, feet flat. Raise one bent leg and, grasping ankle with both hands, pull leg toward opposite shoulder. Hold _30__ seconds.  Repeat _2__ times per session. Do __1_ sessions per day. Perform with other leg straight.  Copyright  VHI. All rights reserved.   Supine With Rotation    Lie, back flat, legs bent, feet together. Rotate knees to one side. Hold _30__ seconds. Repeat to other side. Repeat _2__ times per session. Do _1__ sessions per day.  Copyright  VHI. All rights reserved.    Quads / HF, Side-Lying    Lie on one side, legs bent. Hold foot of top leg with same-side hand. Raise leg. Hold _30__ seconds.  Repeat _2__ times per session. Do _1__ sessions per day.  Copyright  VHI. All rights reserved.  Press-Up    Press upper body upward, keeping hips in contact with floor. Keep lower back and buttocks relaxed. Hold __5__ seconds. Repeat __5__ times per set. Do __1__ sets per session. Do __1__ sessions per day.  http://orth.exer.us/94   Copyright  VHI. All rights reserved.    Chair Sitting    Sit at edge of seat, spine straight, one leg extended. Put a hand on each thigh and bend forward from the hip, keeping spine straight. Allow hand on extended leg to reach toward toes. Support upper body with other arm. Hold _30__ seconds. Repeat _2__ times per session. Do __1_ sessions per day.  Copyright  VHI. All rights reserved.  Hook-Lying    Lie with hips and knees bent. Allow body's muscles to relax. Place hands on belly. Inhale slowly and deeply for __3_ seconds, so hands move up. Then take _3__ seconds to exhale. Repeat  _5__ times. Do __2_ times a day.   Copyright  VHI. All rights reserved.  [facility] [address] [place], [place] [address] Phone # [phone] Fax [phone]

## 2016-08-09 NOTE — Therapy (Signed)
[facility] [address], [place], [place], [address] Phone: [phone]   Fax:  [phone]  Physical Therapy Treatment  Patient Details  Name: Hector Pugh MRN: [id number] Date of Birth: 10/04/1961 Referring Provider: Dr. Stark Klein  Encounter Date: 08/09/2016      PT End of Session - 08/09/16 0840    Visit Number 2   Date for PT Re-Evaluation 09/10/16   PT Start Time 0800   PT Stop Time 0840   PT Time Calculation (min) 40 min   Activity Tolerance Patient tolerated treatment well   Behavior During Therapy [facility] for tasks assessed/performed      Past Medical History:  Diagnosis Date  . 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 Cancer ([place])   . Diabetes mellitus ([place]) 11/10/2012  . Diabetes mellitus without complication (HCC)    diet controlled  . HTN (hypertension) 11/10/2012  . Hypertension   . 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 Pancreatic cancer ([place])   . Pancreatitis 11/10/2012  . Seasonal allergies     Past Surgical History:  Procedure Laterality Date  . circucision    . circumsision    . ERCP  11/08/2012   Procedure: ENDOSCOPIC RETROGRADE CHOLANGIOPANCREATOGRAPHY (ERCP);  Surgeon: Jeryl Columbia, MD;  Location: [facility];  Service: Gastroenterology;  Laterality: N/A;  . EUS  11/08/2012   Procedure: UPPER ENDOSCOPIC ULTRASOUND (EUS) LINEAR;  Surgeon: Arta Silence, MD;  [facility];  Service: Endoscopy;  Laterality: N/A;  . FINE NEEDLE ASPIRATION  11/08/2012   Procedure: FINE NEEDLE ASPIRATION (FNA) LINEAR;  Surgeon: Arta Silence, MD;  [facility];  Service: Endoscopy;  Laterality: N/A;  . HERNIA REPAIR     RIGHT  . LAPAROSCOPY  12/12/2012   Procedure: LAPAROSCOPY DIAGNOSTIC;  Surgeon: Stark Klein, MD;  [facility];  Service: General;  Laterality: N/A;  . Left Foot Surgery    . Left Foot Surgery    . VASECTOMY    . WHIPPLE  PROCEDURE  12/12/2012   Procedure: WHIPPLE PROCEDURE;  Surgeon: Stark Klein, MD;  [facility];  Service: General;  Laterality: N/A;    There were no vitals filed for this visit.      Subjective Assessment - 08/09/16 0805    Subjective I only have pain on the right lateral trunk. Massage is helping.    How long can you sit comfortably? when have pain sitting will hurt   Patient Stated Goals reduce pain in abdomen   Currently in Pain? Yes   Pain Score 5    Pain Location Abdomen   Pain Orientation Right;Upper   Pain Descriptors / Indicators Sore   Pain Type Chronic pain   Pain Onset More than a month ago   Pain Frequency Intermittent   Aggravating Factors  pressure on area; lifting after the next day   Pain Relieving Factors tramadol   Multiple Pain Sites No                         [facility] Adult PT Treatment/Exercise - 08/09/16 0001      Manual Therapy   Manual Therapy Soft tissue mobilization;Myofascial release   Manual therapy comments rib cage mobilization with breathing to assist in downward movement   Soft tissue mobilization scar massage to abdomen, soft tissue work to right diaphgram, right abdominal wall, right lateral and posterior abdominal wall   Myofascial Release transverse  abdomen; lateral abdominal wall with bringing hands upward                PT Education - 08/09/16 0838    Education provided Yes   Education Details diaphgramatic breathing, flexibility   Person(s) Educated Patient   Methods Explanation;Demonstration;Handout   Comprehension Verbalized understanding;Returned demonstration          PT Short Term Goals - 08/09/16 0841      PT SHORT TERM GOAL #1   Title independent with initial HEP   Time 1   Period Months   Status Achieved     PT SHORT TERM GOAL #2   Title gas pain after eating decreased >/= 25% due to improve mobility of tissue   Time 1   Period Months   Status On-going     PT SHORT TERM GOAL #3    Title lifting with >/= 25% decreased in pain   Time 1   Period Months   Status On-going           PT Long Term Goals - 07/11/16 5790      PT LONG TERM GOAL #1   Title independent with HEP   Time 2   Period Months   Status New     PT LONG TERM GOAL #2   Title reduction in abdominal pain with lifitng the following day   Time 3   Period Months   Status New     PT LONG TERM GOAL #3   Title FOTO score </= 27% limitation   Time 2   Period Months   Status New     PT LONG TERM GOAL #4   Title ability to do heavy work with pain decreased >/= 75%   Time 2   Period Months   Status New     PT LONG TERM GOAL #5   Title after eating the gas pain decreased >/= 50% due to improve mobiity of intestines   Time 2   Period Months   Status New               Plan - 08/09/16 0841    Clinical Impression Statement Patient has not met goals due to just starting therapy.  Patient feels pain has decreased a little.  Patient had increased abodminal tissue and rib cage mobility after therapy.  Patient still has thickness in abdominal scar.  Patient will benefit from physical therapy to improve tissue mobility and decrease pain.    Rehab Potential Excellent   Clinical Impairments Affecting Rehab Potential None   PT Frequency 1x / week   PT Duration Other (comment)  2 months   PT Treatment/Interventions Cryotherapy;Electrical Stimulation;Therapeutic activities;Therapeutic exercise;Neuromuscular re-education;Patient/family education;Scar mobilization;Manual techniques;Dry needling   PT Next Visit Plan soft tissue work, abdominal bracing,  strength   PT Home Exercise Plan abdominal massage   Consulted and Agree with Plan of Care Patient      Patient will benefit from skilled therapeutic intervention in order to improve the following deficits and impairments:  Decreased range of motion, Increased fascial restricitons, Pain, Decreased endurance, Decreased activity tolerance, Decreased  strength, Decreased mobility  Visit Diagnosis: Other muscle spasm  Muscle weakness (generalized)     Problem List Patient Active Problem List   Diagnosis Date Noted  . Acute pancreatitis 06/11/2014  . Headache 06/11/2014  . 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 Obstructive jaundice 11/11/2012  . Biliary stricture 11/11/2012  . Diabetes mellitus (  [place]) 11/10/2012  . HTN (hypertension) 11/10/2012  . Pancreatitis 11/10/2012  . Abdominal pain 11/10/2012    Earlie Counts, PT 08/09/16 8:44 AM   [facility] [address] [place], [place], [address] Phone: [phone]   Fax:  [phone]  Name: Duard Spiewak MRN: [id number] Date of Birth: 03-03-1961

## 2016-08-17 ENCOUNTER — Encounter: Payer: Self-pay | Admitting: Physical Therapy

## 2016-08-17 ENCOUNTER — Ambulatory Visit: Payer: BLUE CROSS/BLUE SHIELD | Admitting: Physical Therapy

## 2016-08-17 DIAGNOSIS — M62838 Other muscle spasm: Secondary | ICD-10-CM

## 2016-08-17 DIAGNOSIS — M6281 Muscle weakness (generalized): Secondary | ICD-10-CM

## 2016-08-17 NOTE — Patient Instructions (Addendum)
Isometric Hold (Hook-Lying)    Lie with hips and knees bent. Slowly inhale, and then exhale. Pull navel toward spine and Hold for __5_ seconds. Continue to breathe in and out during hold. Rest for _5__ seconds. Repeat 10___ times. Do _1__ times a day.   Copyright  VHI. All rights reserved.   Bracing With Knee Fallout (Hook-Lying)    With neutral spine, tighten pelvic floor and abdominals and hold. Alternating legs, drop knee out to side. Do not let pelvis move.  Keep opposite hip still. Repeat _10__ times. Do _1__ times a day.   Copyright  VHI. All rights reserved.  Bracing With Leg March (Hook-Lying)    With neutral spine, tighten pelvic floor and abdominals and hold. Alternating legs, lift foot __6_ inches and return to floor. Repeat _10__ times. Do __1_ times a day.   Copyright  VHI. All rights reserved.  Bracing With Heel Slides (Supine)    With neutral spine, tighten pelvic floor and abdominals and hold. Alternating legs, slide heel to bottom. Repeat _10__ times. Do _1__ times a day.   Copyright  VHI. All rights reserved.  [facility] [address], [place], [place] [address] Phone # [phone] Fax [phone]

## 2016-08-17 NOTE — Therapy (Signed)
[facility] [address], [place], [place], [address] Phone: [phone]   Fax:  [phone]  Physical Therapy Treatment  Patient Details  Name: Hector Pugh MRN: [id number] Date of Birth: 16-Aug-1961 Referring Provider: Dr. Stark Klein  Encounter Date: 08/17/2016      PT End of Session - 08/17/16 0806    Visit Number 3   Date for PT Re-Evaluation 09/10/16   PT Start Time 0800   PT Stop Time 0840   PT Time Calculation (min) 40 min   Activity Tolerance Patient tolerated treatment well   Behavior During Therapy [facility] for tasks assessed/performed      Past Medical History:  Diagnosis Date  . 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 Cancer ([place])   . Diabetes mellitus ([place]) 11/10/2012  . Diabetes mellitus without complication (HCC)    diet controlled  . HTN (hypertension) 11/10/2012  . Hypertension   . 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 Pancreatic cancer ([place])   . Pancreatitis 11/10/2012  . Seasonal allergies     Past Surgical History:  Procedure Laterality Date  . circucision    . circumsision    . ERCP  11/08/2012   Procedure: ENDOSCOPIC RETROGRADE CHOLANGIOPANCREATOGRAPHY (ERCP);  Surgeon: Jeryl Columbia, MD;  Location: [facility];  Service: Gastroenterology;  Laterality: N/A;  . EUS  11/08/2012   Procedure: UPPER ENDOSCOPIC ULTRASOUND (EUS) LINEAR;  Surgeon: Arta Silence, MD;  [facility]: [facility];  Service: Endoscopy;  Laterality: N/A;  . FINE NEEDLE ASPIRATION  11/08/2012   Procedure: FINE NEEDLE ASPIRATION (FNA) LINEAR;  Surgeon: Arta Silence, MD;  [facility]: [facility];  Service: Endoscopy;  Laterality: N/A;  . HERNIA REPAIR     RIGHT  . LAPAROSCOPY  12/12/2012   Procedure: LAPAROSCOPY DIAGNOSTIC;  Surgeon: Stark Klein, MD;  [facility]: [facility];  Service: General;  Laterality: N/A;  . Left Foot Surgery    . Left Foot Surgery    . VASECTOMY    . WHIPPLE  PROCEDURE  12/12/2012   Procedure: WHIPPLE PROCEDURE;  Surgeon: Stark Klein, MD;  [facility];  Service: General;  Laterality: N/A;    There were no vitals filed for this visit.      Subjective Assessment - 08/17/16 0803    Subjective Pain in abdomen is 70% better. Pain is slowly going away. Pain in righ tupper quadrant.    How long can you sit comfortably? when have pain sitting will hurt   Patient Stated Goals reduce pain in abdomen   Currently in Pain? Yes   Pain Score 3    Pain Location Abdomen   Pain Orientation Right;Upper   Pain Descriptors / Indicators Sore   Pain Type Chronic pain   Pain Onset More than a month ago   Pain Frequency Intermittent   Aggravating Factors  pressure on area;    Pain Relieving Factors tramadol   Multiple Pain Sites No                         [facility] Adult PT Treatment/Exercise - 08/17/16 0001      Manual Therapy   Manual Therapy Soft tissue mobilization   Manual therapy comments rib cage mobilization with breathing to assist in downward movement   Soft tissue mobilization scar massage to abdomen, soft tissue work to right diaphgram, right abdominal wall, right lateral and posterior abdominal wall  PT Education - 08/17/16 0839    Education provided Yes   Education Details abdominal strengthening   Person(s) Educated Patient   Methods Explanation;Demonstration;Verbal cues;Handout   Comprehension Returned demonstration;Verbalized understanding          PT Short Term Goals - 08/17/16 0807      PT SHORT TERM GOAL #1   Title independent with initial HEP   Time 1   Period Months   Status Achieved     PT SHORT TERM GOAL #2   Title gas pain after eating decreased >/= 25% due to improve mobility of tissue   Time 1   Period Months   Status Achieved     PT SHORT TERM GOAL #3   Title lifting with >/= 25% decreased in pain   Time 1   Period Months   Status Achieved           PT Long  Term Goals - 08/17/16 6720      PT LONG TERM GOAL #1   Title independent with HEP   Time 2   Period Months   Status On-going     PT LONG TERM GOAL #2   Title reduction in abdominal pain with lifitng the following day   Time 3   Period Months   Status On-going  50% better     PT LONG TERM GOAL #3   Title FOTO score </= 27% limitation   Time 2   Period Months   Status New     PT LONG TERM GOAL #4   Title ability to do heavy work with pain decreased >/= 75%   Time 2   Period Months   Status On-going  50% better     PT LONG TERM GOAL #5   Title after eating the gas pain decreased >/= 50% due to improve mobiity of intestines   Time 2   Period Months   Status Achieved               Plan - 08/17/16 0840    Clinical Impression Statement Patient reports his pain is 75% better and localized in the right upper quadrant by the rib cage.  Patient has learned abdominal exercises to increase core strength. Patient has met all of his STG's.  Patient pain with lifting has improve by 50%.  Patient will benefit form soft tissue work to increase scar and tissue  mobility and body mechanics.    Rehab Potential Excellent   Clinical Impairments Affecting Rehab Potential None   PT Frequency 1x / week   PT Duration Other (comment)  2 months   PT Treatment/Interventions Cryotherapy;Electrical Stimulation;Therapeutic activities;Therapeutic exercise;Neuromuscular re-education;Patient/family education;Scar mobilization;Manual techniques;Dry needling   PT Next Visit Plan soft tissue work, Economist with lifting, Discharge next visit   PT Home Exercise Plan update for discharge   Consulted and Agree with Plan of Care Patient      Patient will benefit from skilled therapeutic intervention in order to improve the following deficits and impairments:  Decreased range of motion, Increased fascial restricitons, Pain, Decreased endurance, Decreased activity tolerance, Decreased strength,  Decreased mobility  Visit Diagnosis: Other muscle spasm  Muscle weakness (generalized)     Problem List Patient Active Problem List   Diagnosis Date Noted  . Acute pancreatitis 06/11/2014  . Headache 06/11/2014  . 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 Obstructive jaundice 11/11/2012  . Biliary stricture 11/11/2012  . Diabetes mellitus ([place]) 11/10/2012  .  HTN (hypertension) 11/10/2012  . Pancreatitis 11/10/2012  . Abdominal pain 11/10/2012    Earlie Counts, PT 08/17/16 8:43 AM   [facility] [address], [place], [place], [address] Phone: [phone]   Fax:  [phone]  Name: Hector Pugh MRN: [id number] Date of Birth: 1960/12/10

## 2016-08-21 ENCOUNTER — Encounter: Payer: BLUE CROSS/BLUE SHIELD | Admitting: Physical Therapy

## 2016-08-21 ENCOUNTER — Encounter: Payer: Self-pay | Admitting: Physical Therapy

## 2016-08-21 ENCOUNTER — Ambulatory Visit: Payer: BLUE CROSS/BLUE SHIELD | Admitting: Physical Therapy

## 2016-08-21 DIAGNOSIS — M6281 Muscle weakness (generalized): Secondary | ICD-10-CM

## 2016-08-21 DIAGNOSIS — M62838 Other muscle spasm: Secondary | ICD-10-CM

## 2016-08-21 NOTE — Therapy (Signed)
[facility] [address] [place], [place], [address] Phone: [phone]   Fax:  [phone]  Physical Therapy Treatment  Patient Details  Name: Hector Pugh MRN: [id number] Date of Birth: 1961-06-02 Referring Provider: Dr. Stark Klein  Encounter Date: 08/21/2016      PT End of Session - 08/21/16 0831    Visit Number 4   Date for PT Re-Evaluation 09/10/16   PT Start Time 0800   PT Stop Time 0838   PT Time Calculation (min) 38 min   Activity Tolerance Patient tolerated treatment well   Behavior During Therapy [facility] for tasks assessed/performed      Past Medical History:  Diagnosis Date  . 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 Cancer ([place])   . Diabetes mellitus ([place]) 11/10/2012  . Diabetes mellitus without complication (HCC)    diet controlled  . HTN (hypertension) 11/10/2012  . Hypertension   . 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 Pancreatic cancer ([place])   . Pancreatitis 11/10/2012  . Seasonal allergies     Past Surgical History:  Procedure Laterality Date  . circucision    . circumsision    . ERCP  11/08/2012   Procedure: ENDOSCOPIC RETROGRADE CHOLANGIOPANCREATOGRAPHY (ERCP);  Surgeon: Jeryl Columbia, MD;  Location: [facility];  Service: Gastroenterology;  Laterality: N/A;  . EUS  11/08/2012   Procedure: UPPER ENDOSCOPIC ULTRASOUND (EUS) LINEAR;  Surgeon: Arta Silence, MD;  [facility];  Service: Endoscopy;  Laterality: N/A;  . FINE NEEDLE ASPIRATION  11/08/2012   Procedure: FINE NEEDLE ASPIRATION (FNA) LINEAR;  Surgeon: Arta Silence, MD;  [facility];  Service: Endoscopy;  Laterality: N/A;  . HERNIA REPAIR     RIGHT  . LAPAROSCOPY  12/12/2012   Procedure: LAPAROSCOPY DIAGNOSTIC;  Surgeon: Stark Klein, MD;  [facility];  Service: General;  Laterality: N/A;  . Left Foot Surgery    . Left Foot Surgery    . VASECTOMY    . WHIPPLE  PROCEDURE  12/12/2012   Procedure: WHIPPLE PROCEDURE;  Surgeon: Stark Klein, MD;  [facility];  Service: General;  Laterality: N/A;    There were no vitals filed for this visit.      Subjective Assessment - 08/21/16 0805    Subjective I had an upset stomach on Saturday and Sunday.    Limitations Lifting   How long can you sit comfortably? when have pain sitting will hurt   Patient Stated Goals reduce pain in abdomen   Currently in Pain? No/denies   Multiple Pain Sites No            [facility] PT Assessment - 08/21/16 0001      Assessment   Medical Diagnosis R10.9 unspecified abdominal pain   Referring Provider Dr. Stark Klein   Prior Therapy for back     Precautions   Precautions Other (comment)   Precaution Comments cancer precautions     Restrictions   Weight Bearing Restrictions No     Balance Screen   Has the patient fallen in the past 6 months No   Has the patient had a decrease in activity level because of a fear of falling?  No   Is the patient reluctant to leave their home because of a fear of falling?  No     Home Ecologist residence     Prior Function   Level of Independence Independent  Vocation Full time employment   Vocation Requirements standing, sitting, lifting   Leisure walk on weekends     Observation/Other Assessments   Focus on Therapeutic Outcomes (FOTO)  27% limitation     Posture/Postural Control   Posture/Postural Control No significant limitations     AROM   Lumbar Extension full   Lumbar - Left Side Bend full     Strength   Overall Strength Comments abdominal strength is 3/5; bil. hip strength is 4+/5                     [facility] Adult PT Treatment/Exercise - 08/21/16 0001      Manual Therapy   Manual Therapy Soft tissue mobilization   Manual therapy comments rib cage mobilization with breathing to assist in downward movement   Soft tissue mobilization scar massage to abdomen, soft  tissue work to right diaphgram, right abdominal wall, right lateral and posterior abdominal wall                PT Education - 08/21/16 0830    Education provided Yes   Education Details reviewed HEP   Person(s) Educated Patient   Methods Explanation   Comprehension Verbalized understanding          PT Short Term Goals - 08/21/16 0836      PT SHORT TERM GOAL #1   Title independent with initial HEP   Time 1   Period Months   Status Achieved     PT SHORT TERM GOAL #2   Title gas pain after eating decreased >/= 25% due to improve mobility of tissue   Time 1   Period Months   Status Achieved           PT Long Term Goals - 08/21/16 0831      PT LONG TERM GOAL #1   Title independent with HEP   Time 2   Period Months   Status Achieved     PT LONG TERM GOAL #2   Title reduction in abdominal pain with lifitng the following day   Time 3   Period Months   Status Achieved     PT LONG TERM GOAL #3   Title FOTO score </= 27% limitation   Time 2   Period Months   Status Achieved     PT LONG TERM GOAL #4   Title ability to do heavy work with pain decreased >/= 75%     PT LONG TERM GOAL #5   Title after eating the gas pain decreased >/= 50% due to improve mobiity of intestines   Time 2   Period Months   Status Achieved               Plan - 08/21/16 0836    Clinical Impression Statement Patient has met all of his goals.  Patient has increased hip and abdominal strength. Patient has full lumbar ROM. He had occasional pain.  The abdominal scar is not as thick and patient is independent with scar massage. Patient is independent with his HEP.  Patient is ready for discharge.    Rehab Potential Excellent   Clinical Impairments Affecting Rehab Potential None   PT Treatment/Interventions Cryotherapy;Electrical Stimulation;Therapeutic activities;Therapeutic exercise;Neuromuscular re-education;Patient/family education;Scar mobilization;Manual techniques;Dry  needling   PT Next Visit Plan Discharge to HEP   PT Home Exercise Plan Current HEP   Consulted and Agree with Plan of Care Patient      Patient will benefit from skilled therapeutic  intervention in order to improve the following deficits and impairments:  Decreased range of motion, Increased fascial restricitons, Pain, Decreased endurance, Decreased activity tolerance, Decreased strength, Decreased mobility  Visit Diagnosis: Other muscle spasm  Muscle weakness (generalized)     Problem List Patient Active Problem List   Diagnosis Date Noted  . Acute pancreatitis 06/11/2014  . Headache 06/11/2014  . 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 Obstructive jaundice 11/11/2012  . Biliary stricture 11/11/2012  . Diabetes mellitus ([place]) 11/10/2012  . HTN (hypertension) 11/10/2012  . Pancreatitis 11/10/2012  . Abdominal pain 11/10/2012    Earlie Counts, PT 08/21/16 8:41 AM    [facility] [address] [place], [place], [address] Phone: [phone]   Fax:  [phone]  Name: Hector Pugh MRN: [id number] Date of Birth: 11-27-1961  PHYSICAL THERAPY DISCHARGE SUMMARY  Visits from Start of Care: 4  Current functional level related to goals / functional outcomes: See above.    Remaining deficits: See above.    Education / Equipment: HEP Plan: Patient agrees to discharge.  Patient goals were met. Patient is being discharged due to meeting the stated rehab goals.  Thank you for the referral. Earlie Counts, PT 08/21/16 8:41 AM  ?????

## 2016-08-30 ENCOUNTER — Encounter: Payer: BLUE CROSS/BLUE SHIELD | Admitting: Physical Therapy

## 2016-10-10 ENCOUNTER — Other Ambulatory Visit (HOSPITAL_BASED_OUTPATIENT_CLINIC_OR_DEPARTMENT_OTHER): Payer: BLUE CROSS/BLUE SHIELD

## 2016-10-10 ENCOUNTER — Ambulatory Visit (HOSPITAL_BASED_OUTPATIENT_CLINIC_OR_DEPARTMENT_OTHER): Payer: BLUE CROSS/BLUE SHIELD | Admitting: Oncology

## 2016-10-10 ENCOUNTER — Telehealth: Payer: Self-pay | Admitting: Oncology

## 2016-10-10 ENCOUNTER — Other Ambulatory Visit: Payer: BLUE CROSS/BLUE SHIELD

## 2016-10-10 VITALS — BP 151/72 | HR 50 | Temp 98.0°F | Resp 18 | Ht 62.0 in | Wt 162.9 lb

## 2016-10-10 DIAGNOSIS — C241 Malignant neoplasm of ampulla of Vater: Secondary | ICD-10-CM | POA: Diagnosis not present

## 2016-10-10 NOTE — Telephone Encounter (Signed)
Appointments scheduled per 10/10/16 los. Appointment letter and appointment schedule mailed to patient, per 10/10/16 los.

## 2016-10-10 NOTE — Progress Notes (Signed)
  [place] OFFICE PROGRESS NOTE   Diagnosis: Ampullary carcinoma  INTERVAL HISTORY:   Mr. Guzzardo returns as scheduled. He feels well. Good appetite. He has occasional "gas" discomfort in the abdomen. He takes one tablet of Creon before meals. No diarrhea. He is working. He saw Dr. Barry Dienes with an episode of abdominal pain in July. A CT of the abdomen was negative for recurrent pancreas cancer or acute abdominal process.  Objective:  Vital signs in last 24 hours:  Blood pressure (!) 151/72, pulse (!) 50, temperature 98 F (36.7 C), temperature source Oral, resp. rate 18, height 5\' 2"  (1.575 m), weight 162 lb 14.4 oz (73.9 kg), SpO2 100 %.    HEENT: Neck without mass Lymphatics: ? Soft 0.5 cm soft left posterior cervical node. No other cervical, supra-clavicular, axillary, or inguinal nodes Resp: Lungs clear bilaterally Cardio: Regular rate and rhythm GI: No hepatosplenomegaly, no mass, nontender Vascular: No leg edema   Lab Results:  CA 19-9 pending   Medications: I have reviewed the patient's current medications.  Assessment/Plan: 1. 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 Obstructive jaundice secondary to #1, status post placement of a bile duct stent on 11/08/2013  3. Post ERCP pancreatitis  4. Elevated CA 19-9 on 11/11/2012 5. Small posterior cervical lymph nodes on exam 01/09/2013 , not noted on exam 10/11/2015 6. Acute pancreatitis July 2015    Disposition:  Mr. Lammie remains  in clinical remission from ampullary carcinoma. We will follow-up on the CA 19-9 from today. He will return for an office visit in 8 months.  Betsy Coder, MD  10/10/2016  8:54 AM

## 2016-10-11 LAB — CANCER ANTIGEN 19-9: CAN 19-9: 21 U/mL (ref 0–35)

## 2017-06-01 ENCOUNTER — Other Ambulatory Visit (HOSPITAL_BASED_OUTPATIENT_CLINIC_OR_DEPARTMENT_OTHER): Payer: 59

## 2017-06-01 ENCOUNTER — Telehealth: Payer: Self-pay | Admitting: Oncology

## 2017-06-01 ENCOUNTER — Ambulatory Visit (HOSPITAL_BASED_OUTPATIENT_CLINIC_OR_DEPARTMENT_OTHER): Payer: 59 | Admitting: Oncology

## 2017-06-01 VITALS — BP 158/73 | HR 62 | Temp 97.8°F | Resp 18 | Ht 62.0 in | Wt 158.2 lb

## 2017-06-01 DIAGNOSIS — C241 Malignant neoplasm of ampulla of Vater: Secondary | ICD-10-CM

## 2017-06-01 NOTE — Progress Notes (Signed)
  [place] OFFICE PROGRESS NOTE   Diagnosis: Ampullary carcinoma  INTERVAL HISTORY:   Mr. Farrelly returns as scheduled. He feels well. Good appetite. He has lost weight with a change in his diet. He is working. He has intermittent discomfort at the right anterior costal margin after eating. He relates this to "gas "discomfort. He takes Gas-X. The discomfort lasted for approximately 1 hour after eating and does not occur daily. No difficulty with bowel function.  Objective:  Vital signs in last 24 hours:  Blood pressure (!) 158/73, pulse 62, temperature 97.8 F (36.6 C), temperature source Oral, resp. rate 18, height 5\' 2"  (1.575 m), weight 158 lb 3.2 oz (71.8 kg), SpO2 100 %.    HEENT: Neck without mass Lymphatics: No cervical, supraclavicular, axillary, or inguinal nodes Resp: Lungs clear bilaterally Cardio: Regular rate and rhythm GI: No hepatosplenomegaly, no mass, mild tenderness at the right costal margin. Vascular: No leg edema   Lab Results: CA 19-9 pending  Medications: I have reviewed the patient's current medications.  Assessment/Plan: 1. 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 Obstructive jaundice secondary to #1, status post placement of a bile duct stent on 11/08/2013  3. Post ERCP pancreatitis  4. Elevated CA 19-9 on 11/11/2012 5. Small posterior cervical lymph nodes on exam 01/09/2013 , not noted on exam 10/11/2015 6. Acute pancreatitis July 2015    Disposition:  Mr.  Pugh remains in clinical remission from ampullary carcinoma. We will follow-up on the CA 19-9 from today. He continues follow-up with Dr. Barry Dienes and would like to continue follow-up here. He will return for an office visit in one year.  The postprandial discomfort at the right costal margin is likely a benign finding. He will seek medical attention if this becomes more consistent or intense.  15 minutes were spent with the patient today. The majority of the time was used for counseling and coordination of care.  Donneta Romberg, MD  06/01/2017  8:33 AM

## 2017-06-01 NOTE — Telephone Encounter (Signed)
Scheduled appt per 6/29 los - Gave patient AVS and calender per los. Lab and f/u in one year.

## 2017-06-02 LAB — CANCER ANTIGEN 19-9: CA 19-9: 18 U/mL (ref 0–35)

## 2017-06-12 ENCOUNTER — Ambulatory Visit: Payer: BLUE CROSS/BLUE SHIELD | Admitting: Oncology

## 2017-06-12 ENCOUNTER — Other Ambulatory Visit: Payer: BLUE CROSS/BLUE SHIELD

## 2018-02-12 ENCOUNTER — Ambulatory Visit: Payer: Self-pay | Admitting: General Surgery

## 2018-04-04 ENCOUNTER — Encounter (HOSPITAL_COMMUNITY)
Admission: RE | Disposition: A | Discharge status: Home / Self Care | Payer: Self-pay | Source: Ambulatory Visit | Attending: General Surgery

## 2018-04-04 ENCOUNTER — Encounter (HOSPITAL_COMMUNITY): Payer: Self-pay | Admitting: *Deleted

## 2018-04-04 ENCOUNTER — Ambulatory Visit (HOSPITAL_COMMUNITY)
Admission: RE | Admit: 2018-04-04 | Discharge: 2018-04-04 | Disposition: A | Discharge status: Home / Self Care | Payer: Managed Care, Other (non HMO) | Source: Ambulatory Visit | Attending: General Surgery | Admitting: General Surgery

## 2018-04-04 ENCOUNTER — Other Ambulatory Visit: Payer: Self-pay

## 2018-04-04 DIAGNOSIS — Z800 Family history of malignant neoplasm of digestive organs: Secondary | ICD-10-CM

## 2018-04-04 DIAGNOSIS — Z90411 Acquired partial absence of pancreas: Secondary | ICD-10-CM | POA: Insufficient documentation

## 2018-04-04 DIAGNOSIS — Z1211 Encounter for screening for malignant neoplasm of colon: Secondary | ICD-10-CM

## 2018-04-04 DIAGNOSIS — Z79899 Other long term (current) drug therapy: Secondary | ICD-10-CM | POA: Insufficient documentation

## 2018-04-04 DIAGNOSIS — Z7984 Long term (current) use of oral hypoglycemic drugs: Secondary | ICD-10-CM

## 2018-04-04 DIAGNOSIS — Q438 Other specified congenital malformations of intestine: Secondary | ICD-10-CM | POA: Insufficient documentation

## 2018-04-04 DIAGNOSIS — I609 Nontraumatic subarachnoid hemorrhage, unspecified: Secondary | ICD-10-CM | POA: Diagnosis not present

## 2018-04-04 DIAGNOSIS — I10 Essential (primary) hypertension: Secondary | ICD-10-CM

## 2018-04-04 DIAGNOSIS — Z8507 Personal history of malignant neoplasm of pancreas: Secondary | ICD-10-CM

## 2018-04-04 DIAGNOSIS — E119 Type 2 diabetes mellitus without complications: Secondary | ICD-10-CM | POA: Insufficient documentation

## 2018-04-04 DIAGNOSIS — G8191 Hemiplegia, unspecified affecting right dominant side: Secondary | ICD-10-CM | POA: Diagnosis not present

## 2018-04-04 HISTORY — PX: COLONOSCOPY: SHX5424

## 2018-04-04 LAB — GLUCOSE, CAPILLARY: GLUCOSE-CAPILLARY: 161 mg/dL — AB (ref 65–99)

## 2018-04-04 SURGERY — COLONOSCOPY
Anesthesia: Moderate Sedation

## 2018-04-04 MED ORDER — FENTANYL CITRATE (PF) 100 MCG/2ML IJ SOLN
INTRAMUSCULAR | Status: DC | PRN
  Administered 2018-04-04 (×2): 25 ug via INTRAVENOUS

## 2018-04-04 MED ORDER — MIDAZOLAM HCL 5 MG/ML IJ SOLN
INTRAMUSCULAR | Status: DC | PRN
  Administered 2018-04-04 (×4): 2 mg via INTRAVENOUS

## 2018-04-04 MED ORDER — MIDAZOLAM HCL 5 MG/ML IJ SOLN
INTRAMUSCULAR | Status: AC
  Filled 2018-04-04: qty 2

## 2018-04-04 MED ORDER — DIPHENHYDRAMINE HCL 50 MG/ML IJ SOLN
INTRAMUSCULAR | Status: AC
  Filled 2018-04-04: qty 1

## 2018-04-04 MED ORDER — SODIUM CHLORIDE 0.9 % IV SOLN
INTRAVENOUS | Status: DC
  Administered 2018-04-04: 500 mL via INTRAVENOUS

## 2018-04-04 MED ORDER — FENTANYL CITRATE (PF) 100 MCG/2ML IJ SOLN
INTRAMUSCULAR | Status: AC
  Filled 2018-04-04: qty 2

## 2018-04-04 NOTE — Op Note (Signed)
[facility] Patient Name: Hector Pugh Procedure Date: 04/04/2018 MRN: [id number] Attending MD: Leighton Ruff , MD Date of Birth: 1961-02-11 CSN: [id number] Age: 57 Admit Type: Outpatient Procedure:                Colonoscopy Indications:              Screening for colorectal malignant neoplasm, unable                            to locate last colonoscopy note (more recent than                            10 years ago) Providers:                Leighton Ruff, MD, Zenon Mayo, RN, Cherylynn Ridges,                            Technician Referring MD:              Medicines:                Fentanyl 50 micrograms IV, Midazolam 8 mg IV Complications:            No immediate complications. Estimated Blood Loss:     Estimated blood loss: none. Procedure:                Pre-Anesthesia Assessment:                           - Prior to the procedure, a History and Physical                            was performed, and patient medications and                            allergies were reviewed. The patient's tolerance of                            previous anesthesia was also reviewed. The risks                            and benefits of the procedure and the sedation                            options and risks were discussed with the patient.                            All questions were answered, and informed consent                            was obtained. Prior Anticoagulants: The patient has                            taken no previous anticoagulant or antiplatelet  agents. ASA Grade Assessment: II - A patient with                            mild systemic disease. After reviewing the risks                            and benefits, the patient was deemed in                            satisfactory condition to undergo the procedure.                           After obtaining informed consent, the colonoscope                            was passed under  direct vision. Throughout the                            procedure, the patient's blood pressure, pulse, and                            oxygen saturations were monitored continuously. The                            EC-[id number] ([id number]) scope was introduced through                            the anus and advanced to the the cecum, identified                            by the ileocecal valve. The colonoscopy was                            technically difficult and complex due to previous                            surgery, significant looping and the patient's poor                            cooperation. Successful completion of the procedure                            was aided by increasing the dose of sedation                            medication, changing the patient to a supine                            position, using manual pressure, withdrawing and                            reinserting the scope and straightening and  shortening the scope to obtain bowel loop                            reduction. The patient tolerated the procedure                            fairly well. The quality of the bowel preparation                            was excellent. Scope In: 8:34:47 AM Scope Out: 9:15:57 AM Scope Withdrawal Time: 0 hours 31 minutes 59 seconds  Total Procedure Duration: 0 hours 41 minutes 10 seconds  Findings:      The perianal and digital rectal examinations were normal. Pertinent       negatives include normal sphincter tone.      The entire examined colon appeared normal. Unable to capture pictures of       IC valve but was visualized. Appendiceal orifice visualized partially       but enable to be captured Impression:               - The entire examined colon is normal.                           - No specimens collected.                           - The colon is normal. Moderate Sedation:      Moderate (conscious) sedation was administered by the  endoscopy nurse       and supervised by the endoscopist. The following parameters were       monitored: oxygen saturation, heart rate, blood pressure, and response       to care. Recommendation:           - Discharge patient to home (ambulatory).                           - Resume previous diet.                           - Continue present medications. Procedure Code(s):        --- Professional ---                           [phone], Colonoscopy, flexible; diagnostic, including                            collection of specimen(s) by brushing or washing,                            when performed (separate procedure) Diagnosis Code(s):        --- Professional ---                           Z12.11, Encounter for screening for malignant                            neoplasm of colon CPT copyright 2017  American Medical Association. All rights reserved. The codes documented in this report are preliminary and upon coder review may  be revised to meet current compliance requirements. Leighton Ruff, MD Leighton Ruff, MD 04/04/8412 9:24:13 AM This report has been signed electronically. Number of Addenda: 0

## 2018-04-04 NOTE — H&P (Signed)
Hector Pugh is an 57 y.o. male.    HPI: 57yo M here for screening colonoscopy.  Pt is s/p whipple for ampullary carcinoma.  Pt states he had a colonoscopy 5-6 yrs ago but cannot remember where.  He states that they did not find any polyps.    Past Medical History:  Diagnosis Date  . 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 Cancer ([place])   . Diabetes mellitus ([place]) 11/10/2012  . Diabetes mellitus without complication (HCC)    diet controlled  . HTN (hypertension) 11/10/2012  . Hypertension   . 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 Pancreatic cancer ([place])   . Pancreatitis 11/10/2012  . Seasonal allergies     Past Surgical History:  Procedure Laterality Date  . circucision    . circumsision    . ERCP  11/08/2012   Procedure: ENDOSCOPIC RETROGRADE CHOLANGIOPANCREATOGRAPHY (ERCP);  Surgeon: Jeryl Columbia, MD;  Location: [facility];  Service: Gastroenterology;  Laterality: N/A;  . EUS  11/08/2012   Procedure: UPPER ENDOSCOPIC ULTRASOUND (EUS) LINEAR;  Surgeon: Arta Silence, MD;  Location: [facility];  Service: Endoscopy;  Laterality: N/A;  . FINE NEEDLE ASPIRATION  11/08/2012   Procedure: FINE NEEDLE ASPIRATION (FNA) LINEAR;  Surgeon: Arta Silence, MD;  Location: [facility];  Service: Endoscopy;  Laterality: N/A;  . HERNIA REPAIR     RIGHT  . LAPAROSCOPY  12/12/2012   Procedure: LAPAROSCOPY DIAGNOSTIC;  Surgeon: Stark Klein, MD;  Location: [facility];  Service: General;  Laterality: N/A;  . Left Foot Surgery    . Left Foot Surgery    . VASECTOMY    . WHIPPLE PROCEDURE  12/12/2012   Procedure: WHIPPLE PROCEDURE;  Surgeon: Stark Klein, MD;  Location: [facility];  Service: General;  Laterality: N/A;    Family History  Problem Relation Age of Onset  . Cancer Mother        esophageal carcinoma   Social History:  reports that he has never smoked. He has never used smokeless tobacco. He reports that he does not drink alcohol or use drugs.  Allergies:  No Known Allergies  Medications Prior to Admission  Medication Sig Dispense Refill  . acetaminophen (TYLENOL) 500 MG tablet Take 500 mg by mouth every 4 (four) hours as needed for moderate pain or headache.     Marland Kitchen glipiZIDE (GLUCOTROL XL) 5 MG 24 hr tablet Take 5 mg by mouth daily with breakfast.     . levothyroxine (SYNTHROID, LEVOTHROID) 50 MCG tablet Take 50 mcg by mouth daily before breakfast.    . losartan (COZAAR) 50 MG tablet Take 50 mg by mouth daily.    . tamsulosin (FLOMAX) 0.4 MG CAPS capsule Take 0.4 mg by mouth daily.  3  . CREON 12000 UNITS CPEP capsule take 2 capsules wuth small meals and snacks; take 3-4 capsules with large/fatty meals (Patient not taking: Reported on 03/28/2018) 380 capsule PRN  . traMADol (ULTRAM) 50 MG tablet Take 1 tablet (50 mg total) by mouth every 6 (six) hours as needed. (Patient not taking: Reported on 03/28/2018) 60 tablet 2    No results found for this or any previous visit (from the past 48 hour(s)). No results found.  Review of Systems  Constitutional: Negative for chills and fever.  HENT: Negative for hearing loss.   Eyes: Negative for blurred vision.  Respiratory: Negative for cough and shortness of breath.   Cardiovascular: Negative for chest pain and palpitations.  Gastrointestinal: Negative  for abdominal pain, nausea and vomiting.  Genitourinary: Negative for dysuria, frequency and urgency.  Neurological: Negative for dizziness and headaches.    Blood pressure (!) 169/97, pulse 81, temperature 97.7 F (36.5 C), temperature source Oral, resp. rate 19, height 5\' 2"  (1.575 m), weight 71.7 kg (158 lb), SpO2 100 %. Physical Exam  Constitutional: He is oriented to person, place, and time. He appears well-developed and well-nourished.  HENT:  Head: Normocephalic and atraumatic.  Eyes: Pupils are equal, round, and reactive to light. Conjunctivae and EOM are normal.  Neck: Normal range of motion. Neck supple.  Cardiovascular: Normal rate and  regular rhythm.  Respiratory: Effort normal. No respiratory distress.  GI: Soft. He exhibits no distension. There is no tenderness.  Musculoskeletal: Normal range of motion.  Neurological: He is alert and oriented to person, place, and time.     Assessment/Plan Pt here for screening colonoscopy.  He is high risk due to his h/o GI cancer.  Risks for procedure including bleeding, perforation, missed pathology and complications from sedation discussed.  All questions answered.    [name]., MD 06/11/2955, 8:00 AM

## 2018-04-04 NOTE — Discharge Instructions (Addendum)
Post Colonoscopy Instructions ° °1. DIET: Follow a light bland diet the first 24 hours after arrival home, such as soup, liquids, crackers, etc.  Be sure to include lots of fluids daily.  Avoid fast food or heavy meals as your are more likely to get nauseated.   °2. You may have some mild rectal bleeding for the first few days after the procedure.  This should get less and less with time.  Resume any blood thinners 2 days after your procedure unless directed otherwise by your physician. °3. Take your usually prescribed home medications unless otherwise directed. °a. If you have any pain, it is helpful to get up and walk around, as it is usually from excess gas. °b. If this is not helpful, you can take an over-the-counter pain medication.  Choose one of the following that works best for you: °i. Naproxen (Aleve, etc)  Two 220mg tabs twice a day °ii. Ibuprofen (Advil, etc) Three 200mg tabs four times a day (every meal & bedtime) °iii. If you still have pain after using one of these, please call the office °4. It is normal to not have a bowel movement for 2-3 days after colonoscopy.   ° °5. ACTIVITIES as tolerated:   °6. You may resume regular (light) daily activities beginning the next day--such as daily self-care, walking, climbing stairs--gradually increasing activities as tolerated.  ° ° °WHEN TO CALL US [phone]: °1. Fever over 101.5 F (38.5 C)  °2. Severe abdominal or chest pain  °3. Large amount of rectal bleeding, passing multiple blood clots  °4. Dizziness or shortness of breath °5. Increasing nausea or vomiting ° ° The clinic staff is available to answer your questions during regular business hours (8:30am-5pm).  Please don’t hesitate to call and ask to speak to one of our nurses for clinical concerns.  ° If you have a medical emergency, go to the nearest emergency room or call 911. ° A surgeon from [facility] is always on call at the hospitals ° ° °[facility] °[address], [address], [place], [place]  [address] ? °MAIN: [phone] ? TOLL FREE: [phone] ?  °FAX [phone] °[email or web address] ° ° °

## 2018-04-05 ENCOUNTER — Emergency Department (HOSPITAL_COMMUNITY): Payer: Managed Care, Other (non HMO)

## 2018-04-05 ENCOUNTER — Inpatient Hospital Stay (HOSPITAL_COMMUNITY): Payer: Managed Care, Other (non HMO) | Admitting: Certified Registered Nurse Anesthetist

## 2018-04-05 ENCOUNTER — Inpatient Hospital Stay (HOSPITAL_COMMUNITY): Payer: Managed Care, Other (non HMO)

## 2018-04-05 ENCOUNTER — Encounter (HOSPITAL_COMMUNITY): Payer: Self-pay | Admitting: General Surgery

## 2018-04-05 ENCOUNTER — Inpatient Hospital Stay (HOSPITAL_COMMUNITY)
Admission: EM | Admit: 2018-04-05 | Discharge: 2018-04-15 | DRG: 023 | Disposition: A | Discharge status: Rehabilitation Facility | Payer: Managed Care, Other (non HMO) | Attending: Neurology | Admitting: Neurology

## 2018-04-05 ENCOUNTER — Inpatient Hospital Stay (HOSPITAL_COMMUNITY)
Admission: EM | Disposition: A | Discharge status: Rehabilitation Facility | Payer: Self-pay | Source: Home / Self Care | Attending: Neurology

## 2018-04-05 DIAGNOSIS — E1159 Type 2 diabetes mellitus with other circulatory complications: Secondary | ICD-10-CM | POA: Diagnosis not present

## 2018-04-05 DIAGNOSIS — Z9911 Dependence on respirator [ventilator] status: Secondary | ICD-10-CM

## 2018-04-05 DIAGNOSIS — G935 Compression of brain: Secondary | ICD-10-CM | POA: Diagnosis present

## 2018-04-05 DIAGNOSIS — A419 Sepsis, unspecified organism: Secondary | ICD-10-CM | POA: Diagnosis not present

## 2018-04-05 DIAGNOSIS — J189 Pneumonia, unspecified organism: Secondary | ICD-10-CM | POA: Diagnosis not present

## 2018-04-05 DIAGNOSIS — I639 Cerebral infarction, unspecified: Secondary | ICD-10-CM

## 2018-04-05 DIAGNOSIS — J9690 Respiratory failure, unspecified, unspecified whether with hypoxia or hypercapnia: Secondary | ICD-10-CM | POA: Diagnosis not present

## 2018-04-05 DIAGNOSIS — R4701 Aphasia: Secondary | ICD-10-CM | POA: Diagnosis present

## 2018-04-05 DIAGNOSIS — D696 Thrombocytopenia, unspecified: Secondary | ICD-10-CM

## 2018-04-05 DIAGNOSIS — J9601 Acute respiratory failure with hypoxia: Secondary | ICD-10-CM | POA: Diagnosis not present

## 2018-04-05 DIAGNOSIS — J690 Pneumonitis due to inhalation of food and vomit: Secondary | ICD-10-CM | POA: Diagnosis not present

## 2018-04-05 DIAGNOSIS — E870 Hyperosmolality and hypernatremia: Secondary | ICD-10-CM | POA: Diagnosis not present

## 2018-04-05 DIAGNOSIS — N400 Enlarged prostate without lower urinary tract symptoms: Secondary | ICD-10-CM | POA: Diagnosis present

## 2018-04-05 DIAGNOSIS — J9811 Atelectasis: Secondary | ICD-10-CM | POA: Diagnosis not present

## 2018-04-05 DIAGNOSIS — I480 Paroxysmal atrial fibrillation: Secondary | ICD-10-CM | POA: Diagnosis present

## 2018-04-05 DIAGNOSIS — E039 Hypothyroidism, unspecified: Secondary | ICD-10-CM | POA: Diagnosis present

## 2018-04-05 DIAGNOSIS — R000 Tachycardia, unspecified: Secondary | ICD-10-CM

## 2018-04-05 DIAGNOSIS — R0989 Other specified symptoms and signs involving the circulatory and respiratory systems: Secondary | ICD-10-CM | POA: Diagnosis not present

## 2018-04-05 DIAGNOSIS — Z79899 Other long term (current) drug therapy: Secondary | ICD-10-CM

## 2018-04-05 DIAGNOSIS — R29724 NIHSS score 24: Secondary | ICD-10-CM | POA: Diagnosis not present

## 2018-04-05 DIAGNOSIS — Z90411 Acquired partial absence of pancreas: Secondary | ICD-10-CM

## 2018-04-05 DIAGNOSIS — R509 Fever, unspecified: Secondary | ICD-10-CM | POA: Diagnosis not present

## 2018-04-05 DIAGNOSIS — Q438 Other specified congenital malformations of intestine: Secondary | ICD-10-CM | POA: Diagnosis not present

## 2018-04-05 DIAGNOSIS — I10 Essential (primary) hypertension: Secondary | ICD-10-CM | POA: Diagnosis present

## 2018-04-05 DIAGNOSIS — Z8507 Personal history of malignant neoplasm of pancreas: Secondary | ICD-10-CM | POA: Diagnosis present

## 2018-04-05 DIAGNOSIS — Z9889 Other specified postprocedural states: Secondary | ICD-10-CM | POA: Diagnosis not present

## 2018-04-05 DIAGNOSIS — I629 Nontraumatic intracranial hemorrhage, unspecified: Secondary | ICD-10-CM | POA: Diagnosis not present

## 2018-04-05 DIAGNOSIS — D62 Acute posthemorrhagic anemia: Secondary | ICD-10-CM | POA: Diagnosis not present

## 2018-04-05 DIAGNOSIS — E871 Hypo-osmolality and hyponatremia: Secondary | ICD-10-CM | POA: Diagnosis present

## 2018-04-05 DIAGNOSIS — G8191 Hemiplegia, unspecified affecting right dominant side: Secondary | ICD-10-CM | POA: Diagnosis present

## 2018-04-05 DIAGNOSIS — D6959 Other secondary thrombocytopenia: Secondary | ICD-10-CM | POA: Diagnosis present

## 2018-04-05 DIAGNOSIS — R29708 NIHSS score 8: Secondary | ICD-10-CM | POA: Diagnosis not present

## 2018-04-05 DIAGNOSIS — R29810 Facial weakness: Secondary | ICD-10-CM | POA: Diagnosis present

## 2018-04-05 DIAGNOSIS — Z4659 Encounter for fitting and adjustment of other gastrointestinal appliance and device: Secondary | ICD-10-CM

## 2018-04-05 DIAGNOSIS — R0689 Other abnormalities of breathing: Secondary | ICD-10-CM

## 2018-04-05 DIAGNOSIS — R0682 Tachypnea, not elsewhere classified: Secondary | ICD-10-CM

## 2018-04-05 DIAGNOSIS — R471 Dysarthria and anarthria: Secondary | ICD-10-CM | POA: Diagnosis present

## 2018-04-05 DIAGNOSIS — I612 Nontraumatic intracerebral hemorrhage in hemisphere, unspecified: Secondary | ICD-10-CM | POA: Diagnosis not present

## 2018-04-05 DIAGNOSIS — N390 Urinary tract infection, site not specified: Secondary | ICD-10-CM | POA: Diagnosis not present

## 2018-04-05 DIAGNOSIS — Z79890 Hormone replacement therapy (postmenopausal): Secondary | ICD-10-CM

## 2018-04-05 DIAGNOSIS — I609 Nontraumatic subarachnoid hemorrhage, unspecified: Secondary | ICD-10-CM | POA: Diagnosis present

## 2018-04-05 DIAGNOSIS — E46 Unspecified protein-calorie malnutrition: Secondary | ICD-10-CM | POA: Diagnosis not present

## 2018-04-05 DIAGNOSIS — I69391 Dysphagia following cerebral infarction: Secondary | ICD-10-CM | POA: Diagnosis not present

## 2018-04-05 DIAGNOSIS — R1310 Dysphagia, unspecified: Secondary | ICD-10-CM | POA: Diagnosis present

## 2018-04-05 DIAGNOSIS — G936 Cerebral edema: Secondary | ICD-10-CM | POA: Diagnosis present

## 2018-04-05 DIAGNOSIS — I4891 Unspecified atrial fibrillation: Secondary | ICD-10-CM | POA: Diagnosis not present

## 2018-04-05 DIAGNOSIS — I82A11 Acute embolism and thrombosis of right axillary vein: Secondary | ICD-10-CM | POA: Diagnosis not present

## 2018-04-05 DIAGNOSIS — D6489 Other specified anemias: Secondary | ICD-10-CM | POA: Diagnosis present

## 2018-04-05 DIAGNOSIS — J954 Chemical pneumonitis due to anesthesia: Secondary | ICD-10-CM | POA: Diagnosis not present

## 2018-04-05 DIAGNOSIS — Z298 Encounter for other specified prophylactic measures: Secondary | ICD-10-CM | POA: Diagnosis not present

## 2018-04-05 DIAGNOSIS — Y839 Surgical procedure, unspecified as the cause of abnormal reaction of the patient, or of later complication, without mention of misadventure at the time of the procedure: Secondary | ICD-10-CM | POA: Diagnosis not present

## 2018-04-05 DIAGNOSIS — R29722 NIHSS score 22: Secondary | ICD-10-CM | POA: Diagnosis not present

## 2018-04-05 DIAGNOSIS — R29718 NIHSS score 18: Secondary | ICD-10-CM | POA: Diagnosis present

## 2018-04-05 DIAGNOSIS — G441 Vascular headache, not elsewhere classified: Secondary | ICD-10-CM | POA: Diagnosis not present

## 2018-04-05 DIAGNOSIS — E119 Type 2 diabetes mellitus without complications: Secondary | ICD-10-CM

## 2018-04-05 DIAGNOSIS — I824Z1 Acute embolism and thrombosis of unspecified deep veins of right distal lower extremity: Secondary | ICD-10-CM | POA: Diagnosis not present

## 2018-04-05 DIAGNOSIS — I615 Nontraumatic intracerebral hemorrhage, intraventricular: Secondary | ICD-10-CM | POA: Diagnosis present

## 2018-04-05 DIAGNOSIS — R5081 Fever presenting with conditions classified elsewhere: Secondary | ICD-10-CM | POA: Diagnosis not present

## 2018-04-05 DIAGNOSIS — T410X5A Adverse effect of inhaled anesthetics, initial encounter: Secondary | ICD-10-CM | POA: Diagnosis not present

## 2018-04-05 DIAGNOSIS — Z800 Family history of malignant neoplasm of digestive organs: Secondary | ICD-10-CM

## 2018-04-05 DIAGNOSIS — R7989 Other specified abnormal findings of blood chemistry: Secondary | ICD-10-CM | POA: Diagnosis not present

## 2018-04-05 DIAGNOSIS — E876 Hypokalemia: Secondary | ICD-10-CM

## 2018-04-05 DIAGNOSIS — I611 Nontraumatic intracerebral hemorrhage in hemisphere, cortical: Secondary | ICD-10-CM | POA: Diagnosis present

## 2018-04-05 DIAGNOSIS — I69151 Hemiplegia and hemiparesis following nontraumatic intracerebral hemorrhage affecting right dominant side: Secondary | ICD-10-CM | POA: Diagnosis not present

## 2018-04-05 DIAGNOSIS — Z7984 Long term (current) use of oral hypoglycemic drugs: Secondary | ICD-10-CM

## 2018-04-05 DIAGNOSIS — Z1211 Encounter for screening for malignant neoplasm of colon: Secondary | ICD-10-CM

## 2018-04-05 DIAGNOSIS — I351 Nonrheumatic aortic (valve) insufficiency: Secondary | ICD-10-CM | POA: Diagnosis not present

## 2018-04-05 DIAGNOSIS — R7309 Other abnormal glucose: Secondary | ICD-10-CM | POA: Diagnosis not present

## 2018-04-05 HISTORY — PX: CRANIOTOMY: SHX93

## 2018-04-05 LAB — POCT I-STAT 3, ART BLOOD GAS (G3+)
Acid-base deficit: 2 mmol/L (ref 0.0–2.0)
Bicarbonate: 23.6 mmol/L (ref 20.0–28.0)
O2 Saturation: 100 %
PCO2 ART: 40.5 mmHg (ref 32.0–48.0)
Patient temperature: 98.6
TCO2: 25 mmol/L (ref 22–32)
pH, Arterial: 7.373 (ref 7.350–7.450)
pO2, Arterial: 363 mmHg — ABNORMAL HIGH (ref 83.0–108.0)

## 2018-04-05 LAB — I-STAT CHEM 8, ED
BUN: 21 mg/dL — ABNORMAL HIGH (ref 6–20)
CALCIUM ION: 0.94 mmol/L — AB (ref 1.15–1.40)
Chloride: 99 mmol/L — ABNORMAL LOW (ref 101–111)
Creatinine, Ser: 0.7 mg/dL (ref 0.61–1.24)
Glucose, Bld: 216 mg/dL — ABNORMAL HIGH (ref 65–99)
HEMATOCRIT: 47 % (ref 39.0–52.0)
HEMOGLOBIN: 16 g/dL (ref 13.0–17.0)
Potassium: 3.9 mmol/L (ref 3.5–5.1)
SODIUM: 132 mmol/L — AB (ref 135–145)
TCO2: 22 mmol/L (ref 22–32)

## 2018-04-05 LAB — APTT: aPTT: 26 seconds (ref 24–36)

## 2018-04-05 LAB — DIFFERENTIAL
Basophils Absolute: 0 10*3/uL (ref 0.0–0.1)
Basophils Relative: 0 %
EOS ABS: 0 10*3/uL (ref 0.0–0.7)
EOS PCT: 0 %
LYMPHS PCT: 9 %
Lymphs Abs: 1.3 10*3/uL (ref 0.7–4.0)
Monocytes Absolute: 0.8 10*3/uL (ref 0.1–1.0)
Monocytes Relative: 6 %
Neutro Abs: 12 10*3/uL — ABNORMAL HIGH (ref 1.7–7.7)
Neutrophils Relative %: 85 %

## 2018-04-05 LAB — CBC
HCT: 45.3 % (ref 39.0–52.0)
Hemoglobin: 15.2 g/dL (ref 13.0–17.0)
MCH: 28.4 pg (ref 26.0–34.0)
MCHC: 33.6 g/dL (ref 30.0–36.0)
MCV: 84.7 fL (ref 78.0–100.0)
PLATELETS: 159 10*3/uL (ref 150–400)
RBC: 5.35 MIL/uL (ref 4.22–5.81)
RDW: 12.8 % (ref 11.5–15.5)
WBC: 14.2 10*3/uL — ABNORMAL HIGH (ref 4.0–10.5)

## 2018-04-05 LAB — PROTIME-INR
INR: 1.18
PROTHROMBIN TIME: 14.9 s (ref 11.4–15.2)

## 2018-04-05 LAB — I-STAT TROPONIN, ED: Troponin i, poc: 0 ng/mL (ref 0.00–0.08)

## 2018-04-05 LAB — COMPREHENSIVE METABOLIC PANEL
ALK PHOS: 44 U/L (ref 38–126)
ALT: 20 U/L (ref 17–63)
ANION GAP: 13 (ref 5–15)
AST: 23 U/L (ref 15–41)
Albumin: 4 g/dL (ref 3.5–5.0)
BUN: 17 mg/dL (ref 6–20)
CALCIUM: 9 mg/dL (ref 8.9–10.3)
CO2: 21 mmol/L — AB (ref 22–32)
Chloride: 99 mmol/L — ABNORMAL LOW (ref 101–111)
Creatinine, Ser: 0.94 mg/dL (ref 0.61–1.24)
GFR calc Af Amer: >60 mL/min (ref >60)
GFR calc non Af Amer: >60 mL/min (ref >60)
Glucose, Bld: 217 mg/dL — ABNORMAL HIGH (ref 65–99)
POTASSIUM: 4 mmol/L (ref 3.5–5.1)
SODIUM: 133 mmol/L — AB (ref 135–145)
TOTAL PROTEIN: 6.7 g/dL (ref 6.5–8.1)
Total Bilirubin: 1.4 mg/dL — ABNORMAL HIGH (ref 0.3–1.2)

## 2018-04-05 LAB — TRIGLYCERIDES: Triglycerides: 99 mg/dL (ref <150)

## 2018-04-05 LAB — GLUCOSE, CAPILLARY
GLUCOSE-CAPILLARY: 202 mg/dL — AB (ref 65–99)
GLUCOSE-CAPILLARY: 192 mg/dL — AB (ref 65–99)
Glucose-Capillary: 131 mg/dL — ABNORMAL HIGH (ref 65–99)
Glucose-Capillary: 110 mg/dL — ABNORMAL HIGH (ref 65–99)

## 2018-04-05 LAB — HEMOGLOBIN AND HEMATOCRIT, BLOOD
HEMATOCRIT: 38.6 % — AB (ref 39.0–52.0)
Hemoglobin: 13.1 g/dL (ref 13.0–17.0)

## 2018-04-05 LAB — MRSA PCR SCREENING: MRSA by PCR: NEGATIVE

## 2018-04-05 LAB — ABO/RH: ABO/RH(D): O POS

## 2018-04-05 LAB — PREPARE RBC (CROSSMATCH)

## 2018-04-05 LAB — ETHANOL

## 2018-04-05 SURGERY — CRANIOTOMY HEMATOMA EVACUATION SUBDURAL
Anesthesia: General | Laterality: Left

## 2018-04-05 MED ORDER — BACITRACIN ZINC 500 UNIT/GM EX OINT
TOPICAL_OINTMENT | CUTANEOUS | Status: DC | PRN
  Administered 2018-04-05: 1 via TOPICAL

## 2018-04-05 MED ORDER — FENTANYL CITRATE (PF) 100 MCG/2ML IJ SOLN
100.0000 ug | INTRAMUSCULAR | Status: DC | PRN
  Administered 2018-04-05 – 2018-04-06 (×6): 100 ug via INTRAVENOUS
  Filled 2018-04-05 (×5): qty 2

## 2018-04-05 MED ORDER — FAMOTIDINE IN NACL 20-0.9 MG/50ML-% IV SOLN
20.0000 mg | Freq: Two times a day (BID) | INTRAVENOUS | Status: DC
  Administered 2018-04-05 – 2018-04-08 (×6): 20 mg via INTRAVENOUS
  Filled 2018-04-05 (×6): qty 50

## 2018-04-05 MED ORDER — FENTANYL CITRATE (PF) 250 MCG/5ML IJ SOLN
INTRAMUSCULAR | Status: AC
  Filled 2018-04-05: qty 5

## 2018-04-05 MED ORDER — ONDANSETRON HCL 4 MG/2ML IJ SOLN
INTRAMUSCULAR | Status: DC | PRN
  Administered 2018-04-05: 4 mg via INTRAVENOUS

## 2018-04-05 MED ORDER — EMPTY CONTAINERS FLEXIBLE MISC
37.5000 g | Freq: Once | Status: AC
  Administered 2018-04-05: 375 mL/h via INTRAVENOUS
  Filled 2018-04-05: qty 188

## 2018-04-05 MED ORDER — MANNITOL 25 % IV SOLN
37.5000 g | Freq: Once | INTRAVENOUS | Status: DC
  Filled 2018-04-05: qty 150

## 2018-04-05 MED ORDER — PROPOFOL 1000 MG/100ML IV EMUL
0.0000 ug/kg/min | INTRAVENOUS | Status: DC
  Administered 2018-04-06: 25 ug/kg/min via INTRAVENOUS
  Administered 2018-04-06 (×2): 20 ug/kg/min via INTRAVENOUS
  Administered 2018-04-06: 30 ug/kg/min via INTRAVENOUS
  Administered 2018-04-07 (×2): 25 ug/kg/min via INTRAVENOUS
  Filled 2018-04-05 (×7): qty 100

## 2018-04-05 MED ORDER — PROPOFOL 10 MG/ML IV BOLUS
INTRAVENOUS | Status: DC | PRN
  Administered 2018-04-05 (×2): 100 mg via INTRAVENOUS

## 2018-04-05 MED ORDER — DOCUSATE SODIUM 50 MG/5ML PO LIQD: 100.0000 mg | Freq: Two times a day (BID) | ORAL | Status: DC | PRN

## 2018-04-05 MED ORDER — THROMBIN (RECOMBINANT) 5000 UNITS EX SOLR
CUTANEOUS | Status: DC | PRN
  Administered 2018-04-05 (×2): via TOPICAL

## 2018-04-05 MED ORDER — ACETAMINOPHEN 325 MG PO TABS
650.0000 mg | ORAL_TABLET | ORAL | Status: DC | PRN
Start: 2018-04-05 — End: 2018-04-15
  Administered 2018-04-09 – 2018-04-15 (×11): 650 mg via ORAL
  Filled 2018-04-05 (×13): qty 2

## 2018-04-05 MED ORDER — ONDANSETRON HCL 4 MG/2ML IJ SOLN
INTRAMUSCULAR | Status: AC
  Filled 2018-04-05: qty 2

## 2018-04-05 MED ORDER — SODIUM CHLORIDE 0.9 % IV SOLN: Freq: Once | INTRAVENOUS | Status: DC

## 2018-04-05 MED ORDER — SODIUM CHLORIDE 0.9 % IV SOLN
500.0000 mg | INTRAVENOUS | Status: AC
  Administered 2018-04-05: 500 mg via INTRAVENOUS
  Filled 2018-04-05: qty 5

## 2018-04-05 MED ORDER — SURGIFOAM 100 EX MISC
CUTANEOUS | Status: DC | PRN
  Administered 2018-04-05: 11:00:00 via TOPICAL

## 2018-04-05 MED ORDER — ONDANSETRON HCL 4 MG/2ML IJ SOLN: 4.0000 mg | INTRAMUSCULAR | Status: DC | PRN

## 2018-04-05 MED ORDER — LIDOCAINE 2% (20 MG/ML) 5 ML SYRINGE
INTRAMUSCULAR | Status: AC
  Filled 2018-04-05: qty 5

## 2018-04-05 MED ORDER — IOPAMIDOL (ISOVUE-370) INJECTION 76%
50.0000 mL | Freq: Once | INTRAVENOUS | Status: AC | PRN
  Administered 2018-04-05: 50 mL via INTRAVENOUS

## 2018-04-05 MED ORDER — HYDROCODONE-ACETAMINOPHEN 5-325 MG PO TABS: 1.0000 | ORAL_TABLET | ORAL | Status: DC | PRN

## 2018-04-05 MED ORDER — LEVOTHYROXINE SODIUM 100 MCG IV SOLR
25.0000 ug | Freq: Every day | INTRAVENOUS | Status: DC
  Administered 2018-04-06 – 2018-04-08 (×3): 25 ug via INTRAVENOUS
  Filled 2018-04-05 (×3): qty 5

## 2018-04-05 MED ORDER — NALOXONE HCL 0.4 MG/ML IJ SOLN: 0.0800 mg | INTRAMUSCULAR | Status: DC | PRN

## 2018-04-05 MED ORDER — MANNITOL 20 % IV SOLN: 37.5000 g | Freq: Once | Status: DC

## 2018-04-05 MED ORDER — ACETAMINOPHEN 160 MG/5ML PO SOLN: 650.0000 mg | ORAL | Status: DC | PRN

## 2018-04-05 MED ORDER — MORPHINE SULFATE (PF) 4 MG/ML IV SOLN: 1.0000 mg | INTRAVENOUS | Status: DC | PRN

## 2018-04-05 MED ORDER — NICARDIPINE HCL IN NACL 20-0.86 MG/200ML-% IV SOLN: 0.0000 mg/h | INTRAVENOUS | Status: DC

## 2018-04-05 MED ORDER — FENTANYL CITRATE (PF) 100 MCG/2ML IJ SOLN
100.0000 ug | INTRAMUSCULAR | Status: DC | PRN
  Filled 2018-04-05: qty 2

## 2018-04-05 MED ORDER — CEFAZOLIN SODIUM-DEXTROSE 1-4 GM/50ML-% IV SOLN
INTRAVENOUS | Status: DC | PRN
  Administered 2018-04-05: 2 g via INTRAVENOUS

## 2018-04-05 MED ORDER — PROMETHAZINE HCL 25 MG PO TABS: 12.5000 mg | ORAL_TABLET | ORAL | Status: DC | PRN

## 2018-04-05 MED ORDER — SUCCINYLCHOLINE CHLORIDE 200 MG/10ML IV SOSY
PREFILLED_SYRINGE | INTRAVENOUS | Status: AC
  Filled 2018-04-05: qty 10

## 2018-04-05 MED ORDER — LIDOCAINE HCL (CARDIAC) PF 100 MG/5ML IV SOSY
PREFILLED_SYRINGE | INTRAVENOUS | Status: DC | PRN
  Administered 2018-04-05: 100 mg via INTRAVENOUS

## 2018-04-05 MED ORDER — IOPAMIDOL (ISOVUE-370) INJECTION 76%
INTRAVENOUS | Status: AC
  Filled 2018-04-05: qty 50

## 2018-04-05 MED ORDER — ARTIFICIAL TEARS OPHTHALMIC OINT
TOPICAL_OINTMENT | OPHTHALMIC | Status: DC | PRN
  Administered 2018-04-05: 1 via OPHTHALMIC

## 2018-04-05 MED ORDER — THROMBIN 5000 UNITS EX SOLR
CUTANEOUS | Status: AC
  Filled 2018-04-05: qty 10000

## 2018-04-05 MED ORDER — ROCURONIUM BROMIDE 100 MG/10ML IV SOLN
INTRAVENOUS | Status: DC | PRN
  Administered 2018-04-05 (×2): 50 mg via INTRAVENOUS

## 2018-04-05 MED ORDER — BACITRACIN ZINC 500 UNIT/GM EX OINT
TOPICAL_OINTMENT | CUTANEOUS | Status: AC
  Filled 2018-04-05: qty 28.35

## 2018-04-05 MED ORDER — FENTANYL CITRATE (PF) 100 MCG/2ML IJ SOLN
INTRAMUSCULAR | Status: DC | PRN
  Administered 2018-04-05: 250 ug via INTRAVENOUS
  Administered 2018-04-05 (×2): 50 ug via INTRAVENOUS

## 2018-04-05 MED ORDER — STROKE: EARLY STAGES OF RECOVERY BOOK
Freq: Once | Status: DC
  Filled 2018-04-05 (×2): qty 1

## 2018-04-05 MED ORDER — LABETALOL HCL 5 MG/ML IV SOLN
10.0000 mg | INTRAVENOUS | Status: DC | PRN
  Filled 2018-04-05: qty 4

## 2018-04-05 MED ORDER — SODIUM CHLORIDE 0.9 % IV SOLN
INTRAVENOUS | Status: DC | PRN
  Administered 2018-04-05: 11:00:00 via INTRAVENOUS

## 2018-04-05 MED ORDER — ORAL CARE MOUTH RINSE
15.0000 mL | OROMUCOSAL | Status: DC
  Administered 2018-04-05 – 2018-04-08 (×22): 15 mL via OROMUCOSAL

## 2018-04-05 MED ORDER — HEMOSTATIC AGENTS (NO CHARGE) OPTIME
TOPICAL | Status: DC | PRN
  Administered 2018-04-05: 1 via TOPICAL

## 2018-04-05 MED ORDER — CHLORHEXIDINE GLUCONATE 0.12% ORAL RINSE (MEDLINE KIT)
15.0000 mL | Freq: Two times a day (BID) | OROMUCOSAL | Status: DC
  Administered 2018-04-05 – 2018-04-07 (×5): 15 mL via OROMUCOSAL

## 2018-04-05 MED ORDER — INSULIN ASPART 100 UNIT/ML ~~LOC~~ SOLN
2.0000 [IU] | SUBCUTANEOUS | Status: DC
  Administered 2018-04-05: 2 [IU] via SUBCUTANEOUS
  Administered 2018-04-05: 4 [IU] via SUBCUTANEOUS
  Administered 2018-04-06 – 2018-04-07 (×5): 2 [IU] via SUBCUTANEOUS
  Administered 2018-04-07: 4 [IU] via SUBCUTANEOUS
  Administered 2018-04-07: 2 [IU] via SUBCUTANEOUS
  Administered 2018-04-08: 4 [IU] via SUBCUTANEOUS
  Administered 2018-04-08: 2 [IU] via SUBCUTANEOUS
  Administered 2018-04-08: 4 [IU] via SUBCUTANEOUS
  Administered 2018-04-08: 2 [IU] via SUBCUTANEOUS

## 2018-04-05 MED ORDER — PROPOFOL 10 MG/ML IV BOLUS
INTRAVENOUS | Status: AC
Start: 2018-04-05 — End: ?
  Filled 2018-04-05: qty 20

## 2018-04-05 MED ORDER — ONDANSETRON HCL 4 MG PO TABS: 4.0000 mg | ORAL_TABLET | ORAL | Status: DC | PRN

## 2018-04-05 MED ORDER — MANNITOL 20 % IV SOLN
37.5000 g | Freq: Once | Status: DC
  Administered 2018-04-05: 37.5 g via INTRAVENOUS

## 2018-04-05 MED ORDER — SENNOSIDES-DOCUSATE SODIUM 8.6-50 MG PO TABS: 1.0000 | ORAL_TABLET | Freq: Two times a day (BID) | ORAL | Status: DC

## 2018-04-05 MED ORDER — ACETAMINOPHEN 650 MG RE SUPP
650.0000 mg | RECTAL | Status: DC | PRN
  Administered 2018-04-06 (×2): 650 mg via RECTAL
  Filled 2018-04-05 (×2): qty 1

## 2018-04-05 MED ORDER — POTASSIUM CHLORIDE IN NACL 20-0.9 MEQ/L-% IV SOLN
INTRAVENOUS | Status: DC
  Administered 2018-04-05 – 2018-04-06 (×2): via INTRAVENOUS
  Filled 2018-04-05 (×2): qty 1000

## 2018-04-05 MED ORDER — SUCCINYLCHOLINE CHLORIDE 20 MG/ML IJ SOLN
INTRAMUSCULAR | Status: DC | PRN
Start: 2018-04-05 — End: 2018-04-05
  Administered 2018-04-05: 120 mg via INTRAVENOUS

## 2018-04-05 MED ORDER — SUCCINYLCHOLINE 20MG/ML (10ML) SYRINGE FOR MEDFUSION PUMP - OPTIME
INTRAMUSCULAR | Status: DC | PRN
  Administered 2018-04-05: 120 mg via INTRAVENOUS

## 2018-04-05 MED ORDER — THROMBIN (RECOMBINANT) 20000 UNITS EX SOLR
CUTANEOUS | Status: AC
  Filled 2018-04-05: qty 20000

## 2018-04-05 MED ORDER — ROCURONIUM BROMIDE 50 MG/5ML IV SOLN
INTRAVENOUS | Status: AC
  Filled 2018-04-05: qty 1

## 2018-04-05 MED ORDER — LEVETIRACETAM IN NACL 500 MG/100ML IV SOLN
500.0000 mg | Freq: Two times a day (BID) | INTRAVENOUS | Status: DC
  Administered 2018-04-05 – 2018-04-08 (×6): 500 mg via INTRAVENOUS
  Filled 2018-04-05 (×6): qty 100

## 2018-04-05 MED ORDER — 0.9 % SODIUM CHLORIDE (POUR BTL) OPTIME
TOPICAL | Status: DC | PRN
  Administered 2018-04-05 (×3): 1000 mL

## 2018-04-05 MED ORDER — NICARDIPINE HCL IN NACL 20-0.86 MG/200ML-% IV SOLN
3.0000 mg/h | INTRAVENOUS | Status: DC
Start: 2018-04-05 — End: 2018-04-09
  Administered 2018-04-05: 5 mg/h via INTRAVENOUS
  Administered 2018-04-07 (×2): 3 mg/h via INTRAVENOUS
  Administered 2018-04-07: 9 mg/h via INTRAVENOUS
  Administered 2018-04-08: 5 mg/h via INTRAVENOUS
  Administered 2018-04-08: 2 mg/h via INTRAVENOUS
  Administered 2018-04-08: 5 mg/h via INTRAVENOUS
  Administered 2018-04-09: 6 mg/h via INTRAVENOUS
  Administered 2018-04-09 (×2): 5 mg/h via INTRAVENOUS
  Filled 2018-04-05 (×2): qty 200
  Filled 2018-04-05: qty 400
  Filled 2018-04-05 (×8): qty 200

## 2018-04-05 MED ORDER — DEXAMETHASONE SODIUM PHOSPHATE 10 MG/ML IJ SOLN
INTRAMUSCULAR | Status: AC
  Filled 2018-04-05: qty 1

## 2018-04-05 SURGICAL SUPPLY — 67 items
BENZOIN TINCTURE PRP APPL 2/3 (GAUZE/BANDAGES/DRESSINGS) IMPLANT
BLADE CLIPPER SURG (BLADE) ×3 IMPLANT
BLADE ULTRA TIP 2M (BLADE) ×3 IMPLANT
BNDG GAUZE ELAST 4 BULKY (GAUZE/BANDAGES/DRESSINGS) ×2 IMPLANT
BUR ACORN 6.0 PRECISION (BURR) ×2 IMPLANT
BUR ACORN 6.0MM PRECISION (BURR) ×1
BUR MATCHSTICK NEURO 3.0 LAGG (BURR) ×2 IMPLANT
BUR SPIRAL ROUTER 2.3 (BUR) ×1 IMPLANT
BUR SPIRAL ROUTER 2.3MM (BUR) ×1
CANISTER SUCT 3000ML PPV (MISCELLANEOUS) ×3 IMPLANT
CARTRIDGE OIL MAESTRO DRILL (MISCELLANEOUS) ×1 IMPLANT
CLIP VESOCCLUDE MED 6/CT (CLIP) IMPLANT
DIFFUSER DRILL AIR PNEUMATIC (MISCELLANEOUS) ×3 IMPLANT
DRAPE INCISE IOBAN 66X45 STRL (DRAPES) ×2 IMPLANT
DRAPE NEUROLOGICAL W/INCISE (DRAPES) ×3 IMPLANT
DRAPE SURG 17X23 STRL (DRAPES) IMPLANT
DRAPE WARM FLUID 44X44 (DRAPE) ×3 IMPLANT
DRSG OPSITE POSTOP 4X6 (GAUZE/BANDAGES/DRESSINGS) ×2 IMPLANT
DURAPREP 6ML APPLICATOR 50/CS (WOUND CARE) ×3 IMPLANT
ELECT REM PT RETURN 9FT ADLT (ELECTROSURGICAL) ×3
ELECTRODE REM PT RTRN 9FT ADLT (ELECTROSURGICAL) ×1 IMPLANT
EVACUATOR 1/8 PVC DRAIN (DRAIN) IMPLANT
EVACUATOR SILICONE 100CC (DRAIN) IMPLANT
FORCEPS BIPOLAR SPETZLER 8 1.0 (NEUROSURGERY SUPPLIES) ×2 IMPLANT
GAUZE SPONGE 4X4 12PLY STRL (GAUZE/BANDAGES/DRESSINGS) ×3 IMPLANT
GAUZE SPONGE 4X4 16PLY XRAY LF (GAUZE/BANDAGES/DRESSINGS) IMPLANT
GLOVE ECLIPSE 6.5 STRL STRAW (GLOVE) ×3 IMPLANT
GLOVE EXAM NITRILE LRG STRL (GLOVE) IMPLANT
GLOVE EXAM NITRILE XL STR (GLOVE) IMPLANT
GLOVE EXAM NITRILE XS STR PU (GLOVE) IMPLANT
GLOVE SURG SS PI 6.0 STRL IVOR (GLOVE) ×6 IMPLANT
GOWN STRL REUS W/ TWL LRG LVL3 (GOWN DISPOSABLE) ×2 IMPLANT
GOWN STRL REUS W/ TWL XL LVL3 (GOWN DISPOSABLE) IMPLANT
GOWN STRL REUS W/TWL 2XL LVL3 (GOWN DISPOSABLE) IMPLANT
GOWN STRL REUS W/TWL LRG LVL3 (GOWN DISPOSABLE) ×4
GOWN STRL REUS W/TWL XL LVL3 (GOWN DISPOSABLE)
GRAFT DURAGEN MATRIX 1WX1L (Tissue) ×2 IMPLANT
HEMOSTAT SURGICEL 2X14 (HEMOSTASIS) ×2 IMPLANT
KIT BASIN OR (CUSTOM PROCEDURE TRAY) ×3 IMPLANT
KIT TURNOVER KIT B (KITS) ×3 IMPLANT
NDL HYPO 25X1 1.5 SAFETY (NEEDLE) ×1 IMPLANT
NEEDLE HYPO 25X1 1.5 SAFETY (NEEDLE) ×3 IMPLANT
NS IRRIG 1000ML POUR BTL (IV SOLUTION) ×9 IMPLANT
OIL CARTRIDGE MAESTRO DRILL (MISCELLANEOUS) ×3
PACK CRANIOTOMY CUSTOM (CUSTOM PROCEDURE TRAY) ×3 IMPLANT
PATTIES SURGICAL .5 X.5 (GAUZE/BANDAGES/DRESSINGS) IMPLANT
PATTIES SURGICAL .5 X3 (DISPOSABLE) ×2 IMPLANT
PATTIES SURGICAL 1X1 (DISPOSABLE) ×4 IMPLANT
PLATE 1.5  2HOLE MED NEURO (Plate) ×6 IMPLANT
PLATE 1.5 2HOLE MED NEURO (Plate) IMPLANT
SCREW SELF DRILL HT 1.5/4MM (Screw) ×12 IMPLANT
SPONGE NEURO XRAY DETECT 1X3 (DISPOSABLE) IMPLANT
SPONGE SURGIFOAM ABS GEL 100 (HEMOSTASIS) ×3 IMPLANT
STAPLER VISISTAT 35W (STAPLE) ×3 IMPLANT
SUT ETHILON 3 0 FSL (SUTURE) IMPLANT
SUT ETHILON 3 0 PS 1 (SUTURE) ×2 IMPLANT
SUT NURALON 4 0 TR CR/8 (SUTURE) ×7 IMPLANT
SUT STEEL 0 (SUTURE)
SUT STEEL 0 18XMFL TIE 17 (SUTURE) IMPLANT
SUT VIC AB 2-0 CT2 18 VCP726D (SUTURE) ×6 IMPLANT
TOWEL GREEN STERILE (TOWEL DISPOSABLE) ×3 IMPLANT
TOWEL GREEN STERILE FF (TOWEL DISPOSABLE) ×3 IMPLANT
TRAY FOLEY MTR SLVR 16FR STAT (SET/KITS/TRAYS/PACK) ×3 IMPLANT
TUBE CONNECTING 12'X1/4 (SUCTIONS) ×1
TUBE CONNECTING 12X1/4 (SUCTIONS) ×2 IMPLANT
UNDERPAD 30X30 (UNDERPADS AND DIAPERS) ×3 IMPLANT
WATER STERILE IRR 1000ML POUR (IV SOLUTION) ×3 IMPLANT

## 2018-04-05 NOTE — Progress Notes (Signed)
RT NOTE:  Pt transported to CT and back to 4N25 without event.  

## 2018-04-05 NOTE — H&P (Signed)
Neurology Consultation Reason for Consult: Right sided weakness Referring Physician: Maryan Rued, W  CC: Right sided weakness  History is obtained from: Daughter  HPI: Hector Pugh is a 57 y.o. male with a history of DM, s/p whipple in 2012 who was last well before he went to bed last night around 8:30 PM.  When his wife left for work, she noticed that he was slurring his speech, however he did not have any clear deficits he went downstairs sometime between 5 and 8:30 AM and made tea.  Subsequently when his daughter arrived home about 8:30 AM he was in bed with vomit and not moving his right side as well.  He has brought to the emergency department as a code stroke where CT revealed large temporal hemorrhage.   LKW: 8:30 PM tpa given?: no, ICH Premorbid modified rankin scale: 0 ICH Score: 0    ROS: Unable to obtain due to altered mental status.   Past Medical History:  Diagnosis Date  . 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 Cancer ([place])   . Diabetes mellitus ([place]) 11/10/2012  . Diabetes mellitus without complication (HCC)    diet controlled  . HTN (hypertension) 11/10/2012  . Hypertension   . 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 Pancreatic cancer ([place])   . Pancreatitis 11/10/2012  . Seasonal allergies      Family History  Problem Relation Age of Onset  . Cancer Mother        esophageal carcinoma     Social History:  reports that he has never smoked. He has never used smokeless tobacco. He reports that he does not drink alcohol or use drugs.   Exam: Current vital signs: Wt 70.5 kg (155 lb 6.8 oz)   BMI 28.43 kg/m  Vital signs in last 24 hours: Pulse Rate:  [72-79] 73 (05/02 1010) Resp:  [14-26] 15 (05/02 1010) BP: (135-159)/(65-96) 159/96 (05/02 1010) SpO2:  [96 %-100 %] 99 % (05/02 1010) Weight:  [70.5 kg (155 lb 6.8 oz)] 70.5 kg (155 lb 6.8 oz) (05/03 0900)   Physical Exam  Constitutional: Appears well-developed and  well-nourished.  Psych: Affect appropriate to situation Eyes: No scleral injection HENT: No OP obstrucion Head: Normocephalic.  Cardiovascular: Normal rate and regular rhythm.  Respiratory: Effort normal, non-labored breathing GI: Soft.  No distension. There is no tenderness.  Skin: WDI  Neuro: Mental Status: Patient is awake, alert, he is able to count fingers, and answer some simple questions and follow commands, but does say some inappropriate words. Cranial Nerves: II: He counts fingers in both hemi-fields left pupil is slightly larger than the right III,IV, VI: He has a clear left gaze preference, slightly disconjugate at times V,VII: He has a left facial droop VIII: hearing is intact to voice Motor: He has a right hemiparesis, initially 4/5 and is able to hold it with drift, subsequently worsening to 2/5 in both the arm and leg Sensory: Sensation is  diminished on the right compared to the left Cerebellar: He does not perform a nuclear ataxia on the left  I have reviewed labs in epic and the results pertinent to this consultation are: Chem-8 elevated glucose at 216  I have reviewed the images obtained: CT head-large temporal hemorrhage with intraventricular extension  Impression:  57 year old male with temporal hemorrhage with intraventricular extension, I have consulted with neurosurgery for consideration of craniectomy with clot evacuation.  Recommendations: 1) Admit to ICU 2) no antiplatelets or anticoagulants  3) blood pressure control with goal systolic 903 [phone] 833 4) Frequent neuro checks 5) If symptoms worsen or there is decreased mental status, repeat stat head CT 6) PT,OT,ST 7) appreciate neurosurgical assistance 8) ICU hyperglycemia protocol    This patient is critically ill and at significant risk of neurological worsening, death and care requires constant monitoring of vital signs, hemodynamics,respiratory and cardiac monitoring, neurological assessment,  discussion with family, other specialists and medical decision making of high complexity. I spent 55 minutes of neurocritical care time  in the care of  this patient.  Roland Rack, MD [facility] [phone]  If 7pm- 7am, please page neurology on call as listed in [place]. 04/05/2018  10:24 AM

## 2018-04-05 NOTE — Anesthesia Preprocedure Evaluation (Addendum)
Anesthesia Evaluation  Patient identified by MRN, date of birth, ID band Patient unresponsive    Reviewed: Allergy & Precautions, NPO status , Patient's Chart, lab work & pertinent test resultsPreop documentation limited or incomplete due to emergent nature of procedure.  Airway Mallampati: I  TM Distance: >3 FB Neck ROM: Full    Dental   Pulmonary    Pulmonary exam normal        Cardiovascular hypertension, Pt. on medications Normal cardiovascular exam     Neuro/Psych Large intracranial bleed    GI/Hepatic CA Pancreas s/p whipple 2012   Endo/Other  diabetes, Type 2  Renal/GU      Musculoskeletal   Abdominal   Peds  Hematology   Anesthesia Other Findings   Reproductive/Obstetrics                           Anesthesia Physical Anesthesia Plan  ASA: IV and emergent  Anesthesia Plan: General   Post-op Pain Management:    Induction: Intravenous, Rapid sequence and Cricoid pressure planned  PONV Risk Score and Plan: 2 and Treatment may vary due to age or medical condition  Airway Management Planned: Oral ETT  Additional Equipment:   Intra-op Plan:   Post-operative Plan: Post-operative intubation/ventilation  Informed Consent: I have reviewed the patients History and Physical, chart, labs and discussed the procedure including the risks, benefits and alternatives for the proposed anesthesia with the patient or authorized representative who has indicated his/her understanding and acceptance.     Plan Discussed with: CRNA and Surgeon  Anesthesia Plan Comments:        Anesthesia Quick Evaluation

## 2018-04-05 NOTE — ED Notes (Signed)
Dr. Christella Noa is coming to see patient. Dr. Leonel Ramsay at bedside to update family.

## 2018-04-05 NOTE — Code Documentation (Signed)
Patient had colonoscopy yesterday at [facility], drowsy but oriented and moving around the house without weakness or deficit.  He went to bed about 2045 last night.  When his wife woke him at 0500 this am, before she left for work, he had some slurred speech.  His wife tried to reach him about 0800 and he did not answer the phone.  His daughter found him about 0830 with right side weakness, facial droop and very slurred speech.  He had also vomited.  EMS was called.  Code Stroke called en route.  Dr Leonel Ramsay met patient on arrival to hospital at [phone].  Stat labs and head CT done.  Patient with left temporal bleed per CT results.  NIHSS 18  Drowsy, left gaze preference, facial droop, right arm and leg weakness,  Slurred speech, aphasia, neglect, sensory loss.  (patient speaking in his native tongue, family at bedside).  Cardene started for BP control.   Dr Christella Noa at bedside to assess patient.  CTA head done, Mannitol given. Patient transported to OR.

## 2018-04-05 NOTE — H&P (Signed)
Hector Pugh is an 57 y.o. male.   Chief Complaint: left temporal lobe iCH, impending herniation HPI: Hector Pugh was found by his daughter approximately 0800 this morning slumped over in his bed. He was last seen normal approximately 0500 this morning. He was minimally communicative, but was following commands.  Found with vomitus around him. Was noted to be plegic on the right by his daughter. Brought to [facility] ED where a head ct revealed a large left temporal lobe ICH. I was called for surgical evaluation Past Medical History:  Diagnosis Date  . 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 Cancer ([place])   . Diabetes mellitus ([place]) 11/10/2012  . Diabetes mellitus without complication (HCC)    diet controlled  . HTN (hypertension) 11/10/2012  . Hypertension   . 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 Pancreatic cancer ([place])   . Pancreatitis 11/10/2012  . Seasonal allergies     Past Surgical History:  Procedure Laterality Date  . circucision    . circumsision    . COLONOSCOPY N/A 04/04/2018   Procedure: COLONOSCOPY;  Surgeon: Leighton Ruff, MD;  Location: [facility] ENDOSCOPY;  Service: Endoscopy;  Laterality: N/A;  . ERCP  11/08/2012   Procedure: ENDOSCOPIC RETROGRADE CHOLANGIOPANCREATOGRAPHY (ERCP);  Surgeon: Jeryl Columbia, MD;  Location: [facility];  Service: Gastroenterology;  Laterality: N/A;  . EUS  11/08/2012   Procedure: UPPER ENDOSCOPIC ULTRASOUND (EUS) LINEAR;  Surgeon: Arta Silence, MD;  Location: [facility]OS[facility];  Service: Endoscopy;  Laterality: N/A;  . FINE NEEDLE ASPIRATION  11/08/2012   Procedure: FINE NEEDLE ASPIRATION (FNA) LINEAR;  Surgeon: Arta Silence, MD;  Location: [facility] ENDOSCOPY;  Service: Endoscopy;  Laterality: N/A;  . HERNIA REPAIR     RIGHT  . LAPAROSCOPY  12/12/2012   Procedure: LAPAROSCOPY DIAGNOSTIC;  Surgeon: Stark Klein, MD;  Location: [facility] ORS;  Service: General;  Laterality: N/A;  . Left Foot Surgery    . Left Foot Surgery    .  VASECTOMY    . WHIPPLE PROCEDURE  12/12/2012   Procedure: WHIPPLE PROCEDURE;  Surgeon: Stark Klein, MD;  Location: [facility] ORS;  Service: General;  Laterality: N/A;    Family History  Problem Relation Age of Onset  . Cancer Mother        esophageal carcinoma   Social History:  reports that he has never smoked. He has never used smokeless tobacco. He reports that he does not drink alcohol or use drugs.  Allergies: No Known Allergies  Medications Prior to Admission  Medication Sig Dispense Refill  . acetaminophen (TYLENOL) 500 MG tablet Take 500 mg by mouth every 4 (four) hours as needed for moderate pain or headache.     Marland Kitchen glipiZIDE (GLUCOTROL XL) 5 MG 24 hr tablet Take 5 mg by mouth daily with breakfast.     . levothyroxine (SYNTHROID, LEVOTHROID) 50 MCG tablet Take 50 mcg by mouth daily before breakfast.    . losartan (COZAAR) 50 MG tablet Take 50 mg by mouth daily.    . tamsulosin (FLOMAX) 0.4 MG CAPS capsule Take 0.4 mg by mouth daily.  3    Results for orders placed or performed during the hospital encounter of 04/05/18 (from the past 48 hour(s))  Ethanol     Status: None   Collection Time: 04/05/18  9:25 AM  Result Value Ref Range   Alcohol, Ethyl (B) <10 <10 mg/dL    Comment:        LOWEST DETECTABLE LIMIT FOR  SERUM ALCOHOL IS 10 mg/dL FOR MEDICAL PURPOSES ONLY Performed at [facility], [place] [address], [place], [place] [address]   Protime-INR     Status: None   Collection Time: 04/05/18  9:25 AM  Result Value Ref Range   Prothrombin Time 14.9 11.4 - 15.2 seconds   INR 1.18     Comment: Performed at [facility] [address] [place], [place] [address]  APTT     Status: None   Collection Time: 04/05/18  9:25 AM  Result Value Ref Range   aPTT 26 24 - 36 seconds    Comment: Performed at [facility] [address], [place], [place] [address]  CBC     Status: Abnormal   Collection Time: 04/05/18  9:25 AM  Result Value Ref Range   WBC 14.2 (H) 4.0 -  10.5 K/uL   RBC 5.35 4.22 - 5.81 MIL/uL   Hemoglobin 15.2 13.0 - 17.0 g/dL   HCT 45.3 39.0 - 52.0 %   MCV 84.7 78.0 - 100.0 fL   MCH 28.4 26.0 - 34.0 pg   MCHC 33.6 30.0 - 36.0 g/dL   RDW 12.8 11.5 - 15.5 %   Platelets 159 150 - 400 K/uL    Comment: Performed at [facility], [place] [address], [place], [place] [address]  Differential     Status: Abnormal   Collection Time: 04/05/18  9:25 AM  Result Value Ref Range   Neutrophils Relative % 85 %   Neutro Abs 12.0 (H) 1.7 - 7.7 K/uL   Lymphocytes Relative 9 %   Lymphs Abs 1.3 0.7 - 4.0 K/uL   Monocytes Relative 6 %   Monocytes Absolute 0.8 0.1 - 1.0 K/uL   Eosinophils Relative 0 %   Eosinophils Absolute 0.0 0.0 - 0.7 K/uL   Basophils Relative 0 %   Basophils Absolute 0.0 0.0 - 0.1 K/uL    Comment: Performed at [facility] [address], [place], [place] [address]  Comprehensive metabolic panel     Status: Abnormal   Collection Time: 04/05/18  9:25 AM  Result Value Ref Range   Sodium 133 (L) 135 - 145 mmol/L   Potassium 4.0 3.5 - 5.1 mmol/L   Chloride 99 (L) 101 - 111 mmol/L   CO2 21 (L) 22 - 32 mmol/L   Glucose, Bld 217 (H) 65 - 99 mg/dL   BUN 17 6 - 20 mg/dL   Creatinine, Ser 0.94 0.61 - 1.24 mg/dL   Calcium 9.0 8.9 - 10.3 mg/dL   Total Protein 6.7 6.5 - 8.1 g/dL   Albumin 4.0 3.5 - 5.0 g/dL   AST 23 15 - 41 U/L   ALT 20 17 - 63 U/L   Alkaline Phosphatase 44 38 - 126 U/L   Total Bilirubin 1.4 (H) 0.3 - 1.2 mg/dL   GFR calc non Af Amer >60 >60 mL/min   GFR calc Af Amer >60 >60 mL/min    Comment: (NOTE) The eGFR has been calculated using the CKD EPI equation. This calculation has not been validated in all clinical situations. eGFR's persistently <60 mL/min signify possible Chronic Kidney Disease.    Anion gap 13 5 - 15    Comment: Performed at [facility] [address] [place], [place] [address]  Glucose, capillary     Status: Abnormal   Collection Time: 04/05/18  9:27 AM  Result Value Ref Range    Glucose-Capillary 202 (H)  65 - 99 mg/dL  I-stat troponin, ED     Status: None   Collection Time: 04/05/18  9:33 AM  Result Value Ref Range   Troponin i, poc 0.00 0.00 - 0.08 ng/mL   Comment 3            Comment: Due to the release kinetics of cTnI, a negative result within the first hours of the onset of symptoms does not rule out myocardial infarction with certainty. If myocardial infarction is still suspected, repeat the test at appropriate intervals.   I-Stat Chem 8, ED     Status: Abnormal   Collection Time: 04/05/18  9:35 AM  Result Value Ref Range   Sodium 132 (L) 135 - 145 mmol/L   Potassium 3.9 3.5 - 5.1 mmol/L   Chloride 99 (L) 101 - 111 mmol/L   BUN 21 (H) 6 - 20 mg/dL   Creatinine, Ser 0.70 0.61 - 1.24 mg/dL   Glucose, Bld 216 (H) 65 - 99 mg/dL   Calcium, Ion 0.94 (L) 1.15 - 1.40 mmol/L   TCO2 22 22 - 32 mmol/L   Hemoglobin 16.0 13.0 - 17.0 g/dL   HCT 47.0 39.0 - 52.0 %  Type and screen     Status: None (Preliminary result)   Collection Time: 04/05/18 11:43 AM  Result Value Ref Range   ABO/RH(D) O POS    Antibody Screen NEG    Sample Expiration 04/08/2018    Unit Number [id number]    Blood Component Type RED CELLS,LR    Unit division 00    Status of Unit ALLOCATED    Transfusion Status OK TO TRANSFUSE    Crossmatch Result      Compatible Performed at [facility], [address] [place], [place] [address]    Unit Number [id number]    Blood Component Type RED CELLS,LR    Unit division 00    Status of Unit ALLOCATED    Transfusion Status OK TO TRANSFUSE    Crossmatch Result Compatible   Prepare RBC     Status: None   Collection Time: 04/05/18 11:43 AM  Result Value Ref Range   Order Confirmation      ORDER PROCESSED BY BLOOD BANK Performed at [facility], [place] [address] [place], [place] [address]   ABO/Rh     Status: None (Preliminary result)   Collection Time: 04/05/18 11:43 AM  Result Value Ref Range   ABO/RH(D)      O  POS Performed at [facility] [address], [place], [place] [address]    Ct Angio Head W Or Wo Contrast  Result Date: 04/05/2018 CLINICAL DATA:  Acute LEFT temporal lobe hemorrhage. Patient has history of hypertension. EXAM: CT ANGIOGRAPHY HEAD TECHNIQUE: Multidetector CT imaging of the head was performed using the standard protocol during bolus administration of intravenous contrast. Multiplanar CT image reconstructions and MIPs were obtained to evaluate the vascular anatomy. CONTRAST:  53m ISOVUE-370 IOPAMIDOL (ISOVUE-370) INJECTION 76% COMPARISON:  Code stroke CT earlier today. FINDINGS: CTA HEAD Anterior circulation: Large LEFT temporal lobe hemorrhage, with intraventricular extension, appears to be stable from prior code stroke CT. Cross-section of 37 x 59 x 33 mm, volume ~36 mL, of the hematoma proper, not including the intraventricular component. LEFT-to-RIGHT shift, 8 mm, including uncal herniation. No visible proximal vascular occlusion, embolus, arteriovenous malformation, saccular aneurysm, RCVS, vasculitis, active extravasation, or venous thrombosis. Posterior circulation: No significant stenosis, proximal occlusion, aneurysm, or vascular malformation. Venous sinuses: As  permitted by contrast timing, patent. Anatomic variants: None. Delayed phase: Not performed. IMPRESSION: Stable LEFT temporal lobe hemorrhage, with LEFT-to-RIGHT midline shift. Lobar hypertensive bleed is favored. No underlying vascular anomaly, or arterial/venous thrombosis/embolus. Findings discussed with ordering provider Khara Renaud MD at approximately 10:40 a.m. Electronically Signed   By: Staci Righter M.D.   On: 04/05/2018 11:12   Ct Head Code Stroke Wo Contrast  Result Date: 04/05/2018 CLINICAL DATA:  Code stroke. Acute focal neuro deficit. Right facial droop. Left facial weakness. Slurred speech. EXAM: CT HEAD WITHOUT CONTRAST TECHNIQUE: Contiguous axial images were obtained from the base of the skull through the  vertex without intravenous contrast. COMPARISON:  CT head 06/11/2014 FINDINGS: Brain: Large hemorrhage in the left temporal lobe measuring 6.6 by 3.8 cm. Surrounding hypodense edema with moderate mass-effect. Intraventricular extension is present with early hydrocephalus. 13 mm midline shift to the right. No subdural or subarachnoid hemorrhage. No other areas of hemorrhage mass or acute infarction identified. Vascular: Negative for hyperdense vessel Skull: Negative Sinuses/Orbits: Negative Other: None ASPECTS ([place] Stroke Program Early CT Score) - Ganglionic level infarction (caudate, lentiform nuclei, internal capsule, insula, M1-M3 cortex): 7 - Supraganglionic infarction (M4-M6 cortex): 3 Total score (0-10 with 10 being normal): 10 IMPRESSION: 1. Large hemorrhage left temporal lobe with surrounding edema and mass-effect. Intraventricular hemorrhage with mild hydrocephalus. 13 mm midline shift. Etiologies of hemorrhage are not determined on this study but could be due to hypertension, cerebral amyloid, vascular malformation, or less likely tumor. 2. ASPECTS is 10 3. These results were called by telephone at the time of interpretation on 04/05/2018 at 9:42 am to Dr. Leonel Ramsay, who verbally acknowledged these results. Electronically Signed   By: Franchot Gallo M.D.   On: 04/05/2018 09:44    ROS  Blood pressure 140/72, pulse 91, temperature 98 F (36.7 C), temperature source Temporal, resp. rate 15, height 5' 6"  (1.676 m), weight 72.5 kg (159 lb 13.3 oz), SpO2 100 %. Physical Exam   Assessment/Plan OR emergently for hematoma evacuation, left temporal. I have explained the rationale to the family. Risks and benefits were explained, the daughter is a rapid response nurse and is well versed in the situation. They have agreed and wish to proceed.   Demarlo Riojas L, MD 04/05/2018, 2:07 PM

## 2018-04-05 NOTE — Anesthesia Postprocedure Evaluation (Signed)
Anesthesia Post Note  Patient: Hector Pugh  Procedure(s) Performed: Left Temporal CRANIOTOMY HEMATOMA EVACUATION of Intracranial Hemorrhage (Left )     Patient location during evaluation: SICU Anesthesia Type: General Level of consciousness: sedated Pain management: pain level controlled Vital Signs Assessment: post-procedure vital signs reviewed and stable Respiratory status: patient remains intubated per anesthesia plan Cardiovascular status: stable Postop Assessment: no apparent nausea or vomiting Anesthetic complications: no    Last Vitals:  Vitals:   04/05/18 1439 04/05/18 1528  BP:  112/66  Pulse:  89  Resp:  15  Temp:    SpO2: 100% 100%    Last Pain:  Vitals:   04/05/18 0948  TempSrc: Temporal  PainSc: 0-No pain                 Keishawna Carranza DAVID

## 2018-04-05 NOTE — ED Notes (Addendum)
In CT with patient, Dr. Christella Noa in [place]. Will be going up to OR, bay # 34, will give bedside report. Mannitol started in CT, with RR RN, Kelli Churn.

## 2018-04-05 NOTE — Transfer of Care (Signed)
Immediate Anesthesia Transfer of Care Note  Patient: Hector Pugh  Procedure(s) Performed: Left Temporal CRANIOTOMY HEMATOMA EVACUATION of Intracranial Hemorrhage (Left )  Patient Location: PACU and NICU  Anesthesia Type:General  Level of Consciousness: sedated  Airway & Oxygen Therapy: Patient remains intubated per anesthesia plan  Post-op Assessment: Report given to RN and Post -op Vital signs reviewed and stable  Post vital signs: Reviewed and stable  Last Vitals:  Vitals Value Taken Time  BP 113/81 04/05/2018  1:15 PM  Temp    Pulse 90 04/05/2018  1:20 PM  Resp 17 04/05/2018  1:20 PM  SpO2 100 % 04/05/2018  1:20 PM  Vitals shown include unvalidated device data.  Last Pain:  Vitals:   04/05/18 0948  TempSrc: Temporal  PainSc: 0-No pain         Complications: No apparent anesthesia complications

## 2018-04-05 NOTE — Consult Note (Addendum)
PULMONARY / CRITICAL CARE MEDICINE   Name: Hector Pugh MRN: [id number] DOB: 08-23-1961    ADMISSION DATE:  04/05/2018 CONSULTATION DATE:  04/05/18  REFERRING MD:  Dr. Christella Noa   CHIEF COMPLAINT:  Headache, AMS   HISTORY OF PRESENT ILLNESS:   57 y/o M who presented to [facility] on 5/3 with reports of headache, vomiting and altered mental status.  The patient was in his usual state of health until 5/3 am.  He carries a history of pancreatic cancer s/p whipple (2013 / considered cure) with subsequent development of DM.  Prior to admit, he was functional / working full time.  He had a screening colonoscopy on 5/2 which was reportedly clean.  Family reports that the patient complained of headache on the am of 5/2 which they thought was associated with a lack of caffeine intake while NPO for procedure.  During the procedure, he reportedly received 8 mg versed and 50 mcg of fentanyl.  After the procedure, he was sleepy but appropriate.  The patient woke around 0500 and was appropriate.  His wife went to work as usual.  At some point between 0500 and 0800, he went to the kitchen to make tea and went back up stairs.  His wife called to check on him around 0800 and he did not answer the phone.  She called her daughter, who is an ICU nurse, to check on the patient.  On arrival, the patient was reportedly aphasic and had emesis on his shirt and in the bed.  EMS was activated.  He vomited again with EMS transport.  He presented with 4/5 strength per Neurology but did not make it to the OR with 4/5.  Work up in [place] revealed a large temporal hemorrhage.  The patient was taken emergently to the OR for evacuation.  He was returned to ICU on vent postoperatively.   PCCM consulted for post operative ICU management.   PAST MEDICAL HISTORY :  He  has a past medical history of Biliary stricture (11/11/2012), Cancer ([place]), Diabetes mellitus ([place]) (11/10/2012), Diabetes mellitus without complication ([place]), HTN (hypertension)  (11/10/2012), Hypertension, Obstructive jaundice (11/11/2012), Pancreatic cancer ([place]), Pancreatitis (11/10/2012), and Seasonal allergies.  PAST SURGICAL HISTORY: He  has a past surgical history that includes circucision; Hernia repair; Left Foot Surgery; Vasectomy; circumsision; Left Foot Surgery; EUS (11/08/2012); Fine needle aspiration (11/08/2012); ERCP (11/08/2012); laparoscopy (12/12/2012); Whipple procedure (12/12/2012); and Colonoscopy (N/A, 04/04/2018).  No Known Allergies  No current facility-administered medications on file prior to encounter.    Current Outpatient Medications on File Prior to Encounter  Medication Sig  . acetaminophen (TYLENOL) 500 MG tablet Take 500 mg by mouth every 4 (four) hours as needed for moderate pain or headache.   Marland Kitchen glipiZIDE (GLUCOTROL XL) 5 MG 24 hr tablet Take 5 mg by mouth daily with breakfast.   . levothyroxine (SYNTHROID, LEVOTHROID) 50 MCG tablet Take 50 mcg by mouth daily before breakfast.  . losartan (COZAAR) 50 MG tablet Take 50 mg by mouth daily.  . tamsulosin (FLOMAX) 0.4 MG CAPS capsule Take 0.4 mg by mouth daily.    FAMILY HISTORY:  His indicated that his mother is deceased. He indicated that his father is deceased.   SOCIAL HISTORY: He  reports that he has never smoked. He has never used smokeless tobacco. He reports that he does not drink alcohol or use drugs.  REVIEW OF SYSTEMS:   As above per family.  Unable to complete with patient due to AMS.    SUBJECTIVE:  VITAL SIGNS: BP 140/72   Pulse 91   Temp 98 F (36.7 C) (Temporal)   Resp 15   Ht 5\' 6"  (1.676 m)   Wt 159 lb 13.3 oz (72.5 kg)   SpO2 100%   BMI 25.80 kg/m   HEMODYNAMICS:    VENTILATOR SETTINGS: Vent Mode: PRVC FiO2 (%):  [100 %] 100 % Set Rate:  [15 bmp] 15 bmp Vt Set:  [510 mL] 510 mL PEEP:  [5 cmH20] 5 cmH20 Plateau Pressure:  [17 cmH20] 17 cmH20  INTAKE / OUTPUT: No intake/output data recorded.  PHYSICAL EXAMINATION: General:  Well developed adult male  on vent in NAD Neuro:  Sedate on propofol, pupils 60mm / sluggish HEENT:  MM pink/moist, ETT Cardiovascular:  s1s2 rrr, no m/r/g  Lungs:  Even/non-labored, lungs bilaterally clear  Abdomen:  Soft, bsx4 active  Musculoskeletal:  No acute deformities  Skin:  Warm/dry, no edema, rashes or lesions  LABS:  BMET Recent Labs  Lab 04/05/18 0925 04/05/18 0935  NA 133* 132*  K 4.0 3.9  CL 99* 99*  CO2 21*  --   BUN 17 21*  CREATININE 0.94 0.70  GLUCOSE 217* 216*    Electrolytes Recent Labs  Lab 04/05/18 0925  CALCIUM 9.0    CBC Recent Labs  Lab 04/05/18 0925 04/05/18 0935  WBC 14.2*  --   HGB 15.2 16.0  HCT 45.3 47.0  PLT 159  --     Coag's Recent Labs  Lab 04/05/18 0925  APTT 26  INR 1.18    Sepsis Markers No results for input(s): LATICACIDVEN, PROCALCITON, O2SATVEN in the last 168 hours.  ABG No results for input(s): PHART, PCO2ART, PO2ART in the last 168 hours.  Liver Enzymes Recent Labs  Lab 04/05/18 0925  AST 23  ALT 20  ALKPHOS 44  BILITOT 1.4*  ALBUMIN 4.0    Cardiac Enzymes No results for input(s): TROPONINI, PROBNP in the last 168 hours.  Glucose Recent Labs  Lab 04/04/18 0805 04/05/18 0927  GLUCAP 161* 202*    Imaging Ct Angio Head W Or Wo Contrast  Result Date: 04/05/2018 CLINICAL DATA:  Acute LEFT temporal lobe hemorrhage. Patient has history of hypertension. EXAM: CT ANGIOGRAPHY HEAD TECHNIQUE: Multidetector CT imaging of the head was performed using the standard protocol during bolus administration of intravenous contrast. Multiplanar CT image reconstructions and MIPs were obtained to evaluate the vascular anatomy. CONTRAST:  16mL ISOVUE-370 IOPAMIDOL (ISOVUE-370) INJECTION 76% COMPARISON:  Code stroke CT earlier today. FINDINGS: CTA HEAD Anterior circulation: Large LEFT temporal lobe hemorrhage, with intraventricular extension, appears to be stable from prior code stroke CT. Cross-section of 37 x 59 x 33 mm, volume ~36 mL, of the  hematoma proper, not including the intraventricular component. LEFT-to-RIGHT shift, 8 mm, including uncal herniation. No visible proximal vascular occlusion, embolus, arteriovenous malformation, saccular aneurysm, RCVS, vasculitis, active extravasation, or venous thrombosis. Posterior circulation: No significant stenosis, proximal occlusion, aneurysm, or vascular malformation. Venous sinuses: As permitted by contrast timing, patent. Anatomic variants: None. Delayed phase: Not performed. IMPRESSION: Stable LEFT temporal lobe hemorrhage, with LEFT-to-RIGHT midline shift. Lobar hypertensive bleed is favored. No underlying vascular anomaly, or arterial/venous thrombosis/embolus. Findings discussed with ordering provider Cabbell MD at approximately 10:40 a.m. Electronically Signed   By: Staci Righter M.D.   On: 04/05/2018 11:12   Ct Head Code Stroke Wo Contrast  Result Date: 04/05/2018 CLINICAL DATA:  Code stroke. Acute focal neuro deficit. Right facial droop. Left facial weakness. Slurred speech. EXAM: CT HEAD WITHOUT  CONTRAST TECHNIQUE: Contiguous axial images were obtained from the base of the skull through the vertex without intravenous contrast. COMPARISON:  CT head 06/11/2014 FINDINGS: Brain: Large hemorrhage in the left temporal lobe measuring 6.6 by 3.8 cm. Surrounding hypodense edema with moderate mass-effect. Intraventricular extension is present with early hydrocephalus. 13 mm midline shift to the right. No subdural or subarachnoid hemorrhage. No other areas of hemorrhage mass or acute infarction identified. Vascular: Negative for hyperdense vessel Skull: Negative Sinuses/Orbits: Negative Other: None ASPECTS ([place] Stroke Program Early CT Score) - Ganglionic level infarction (caudate, lentiform nuclei, internal capsule, insula, M1-M3 cortex): 7 - Supraganglionic infarction (M4-M6 cortex): 3 Total score (0-10 with 10 being normal): 10 IMPRESSION: 1. Large hemorrhage left temporal lobe with surrounding  edema and mass-effect. Intraventricular hemorrhage with mild hydrocephalus. 13 mm midline shift. Etiologies of hemorrhage are not determined on this study but could be due to hypertension, cerebral amyloid, vascular malformation, or less likely tumor. 2. ASPECTS is 10 3. These results were called by telephone at the time of interpretation on 04/05/2018 at 9:42 am to Dr. Leonel Ramsay, who verbally acknowledged these results. Electronically Signed   By: Franchot Gallo M.D.   On: 04/05/2018 09:44     STUDIES:  CTA Head 5/3 >> stable left temporal lobe hemorrhage with left to right midline shift. Lobar hypertensive bleed is favored. No underlying vascular anomaly, or arterial / venous thrombosis/embolus.   CULTURES:   ANTIBIOTICS:   SIGNIFICANT EVENTS: 5/03  Admit with AMS, headache, vomiting.  L ICH / hypertensive bleed   LINES/TUBES: ETT 5/3 >>   DISCUSSION: 57 y/o M admitted with large left ICH.  To OR 5/3 for evacuation of bleed.   ASSESSMENT / PLAN:   NEUROLOGIC A:   Large Left ICH - suspect hypertensive in nature, pt was NPO for procedure P:   RASS goal: 0 to -1  Further imaging per NSGY / Neurology Frequent neuro exams  PT/OT/ST when able  Repeat CT head this evening per Neurology   PULMONARY A: Acute Respiratory Insuffiencey  P:   PRVC 8 cc/kg  Assess ABG, CXR now  Hold extubation until am neuro review  CARDIOVASCULAR A:  Hypertension  P:  Cardene gtt as needed for SBP 120-140 Propofol gtt for sedation / BP effect  ICU monitoring   RENAL A:   Hyponatremia P:   Trend BMP / urinary output Replace electrolytes as indicated Avoid nephrotoxic agents, ensure adequate renal perfusion NS with 20 mEq KCL at 110ml/hr  GASTROINTESTINAL A:   Vomiting - preadmit, in setting of ICH  Hx Pancreatic CA s/p Whipple 2013) P:   NPO  OGT PRN zofran   HEMATOLOGIC A:   Leukocytosis  P:  Trend CBC SCD's for DVT prophylaxis  Hold previously ordered transfusion for  now.  Reassess CBC.   INFECTIOUS A:   Vomiting with possible Aspiration  P:   Monitor CXR > no evidence of aspiration on initial film No role abx at this time  Follow fever curve / WBC trend   ENDOCRINE A:   DM - s/p Whipple    P:   SSI    FAMILY  - Updates: Family updated at bedside by Dr. Lynetta Mare.     CC Time:  30 minutes   Noe Gens, NP-C  Pulmonary & Critical Care Pgr: [phone] or if no answer [phone] 04/05/2018, 2:17 PM

## 2018-04-05 NOTE — Anesthesia Procedure Notes (Signed)
Procedure Name: Intubation Date/Time: 04/05/2018 11:00 AM Performed by: Scheryl Darter, CRNA Pre-anesthesia Checklist: Patient identified, Emergency Drugs available, Suction available, Patient being monitored and Timeout performed Patient Re-evaluated:Patient Re-evaluated prior to induction Oxygen Delivery Method: Circle system utilized Preoxygenation: Pre-oxygenation with 100% oxygen Induction Type: IV induction, Rapid sequence and Cricoid Pressure applied Laryngoscope Size: Mac and 4 Grade View: Grade I Tube type: Oral Tube size: 7.5 mm Number of attempts: 1 Airway Equipment and Method: Stylet Placement Confirmation: ETT inserted through vocal cords under direct vision,  positive ETCO2 and breath sounds checked- equal and bilateral Secured at: 22 cm Tube secured with: Tape Dental Injury: Teeth and Oropharynx as per pre-operative assessment

## 2018-04-05 NOTE — ED Provider Notes (Signed)
[facility] EMERGENCY DEPARTMENT Provider Note   CSN: [id number] Arrival date & time: 04/05/18  6433     History   Chief Complaint Chief Complaint  Patient presents with  . Code Stroke    HPI Hector Pugh is a 57 y.o. male.  Patient is a 57 year old male with a history of biliary cancer status post Whipple, diabetes, hypertension presenting today with altered mental status, left-sided facial droop and right-sided weakness that started between 5 AM and 8 AM this morning.  Daughter states that patient had a colonoscopy done yesterday morning where he received 8 mg of Versed and 50 mcg of fentanyl.  After returning home from the colonoscopy which was clear he was drowsy for the rest of the day.  However he was able to get up and walk and answer questions he was just very sleepy.  This morning before his wife went to work around 5 AM he was drowsy but answering questions appropriately.  Sometime between 5 and 8 AM he went downstairs and move some things around in the kitchen but his daughter found him in bed slumped over with emesis around him and weakness in the right arm and left-sided facial droop.  She called 911 immediately.  Patient is able to answer some questions here but is groggy.  He is denying any pain.  Patient does not take anticoagulation and normally does take blood pressure medication but it was held yesterday before the colonoscopy and he has not taken anything today.  Daughter reports that before leaving colonoscopy yesterday blood pressures were in the 295 systolic.  The history is provided by the spouse and a relative.    Past Medical History:  Diagnosis Date  . 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 Cancer ([place])   . Diabetes mellitus ([place]) 11/10/2012  . Diabetes mellitus without complication (HCC)    diet controlled  . HTN (hypertension) 11/10/2012  . Hypertension   . 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  Pancreatic cancer ([place])   . Pancreatitis 11/10/2012  . Seasonal allergies     Patient Active Problem List   Diagnosis Date Noted  . Acute pancreatitis 06/11/2014  . Headache 06/11/2014  . 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 Obstructive jaundice 11/11/2012  . Biliary stricture 11/11/2012  . Diabetes mellitus ([place]) 11/10/2012  . HTN (hypertension) 11/10/2012  . Pancreatitis 11/10/2012  . Abdominal pain 11/10/2012    Past Surgical History:  Procedure Laterality Date  . circucision    . circumsision    . ERCP  11/08/2012   Procedure: ENDOSCOPIC RETROGRADE CHOLANGIOPANCREATOGRAPHY (ERCP);  Surgeon: Jeryl Columbia, MD;  Location: [facility];  Service: Gastroenterology;  Laterality: N/A;  . EUS  11/08/2012   Procedure: UPPER ENDOSCOPIC ULTRASOUND (EUS) LINEAR;  Surgeon: Arta Silence, MD;  Location: [facility];  Service: Endoscopy;  Laterality: N/A;  . FINE NEEDLE ASPIRATION  11/08/2012   Procedure: FINE NEEDLE ASPIRATION (FNA) LINEAR;  Surgeon: Arta Silence, MD;  Location: [facility];  Service: Endoscopy;  Laterality: N/A;  . HERNIA REPAIR     RIGHT  . LAPAROSCOPY  12/12/2012   Procedure: LAPAROSCOPY DIAGNOSTIC;  Surgeon: Stark Klein, MD;  Location: [facility] ORS;  Service: General;  Laterality: N/A;  . Left Foot Surgery    . Left Foot Surgery    . VASECTOMY    . WHIPPLE PROCEDURE  12/12/2012   Procedure: WHIPPLE PROCEDURE;  Surgeon: Stark Klein,  MD;  Location: [facility]S;  Service: General;  Laterality: N/A;        Home Medications    Prior to Admission medications   Medication Sig Start Date End Date Taking? Authorizing Provider  acetaminophen (TYLENOL) 500 MG tablet Take 500 mg by mouth every 4 (four) hours as needed for moderate pain or headache.     [provider]  glipiZIDE (GLUCOTROL XL) 5 MG 24 hr tablet Take 5 mg by mouth daily with breakfast.  06/15/14   [provider]  levothyroxine (SYNTHROID,  LEVOTHROID) 50 MCG tablet Take 50 mcg by mouth daily before breakfast.    [provider]  losartan (COZAAR) 50 MG tablet Take 50 mg by mouth daily.    [provider]  tamsulosin (FLOMAX) 0.4 MG CAPS capsule Take 0.4 mg by mouth daily. 03/11/18   [provider]    Family History Family History  Problem Relation Age of Onset  . Cancer Mother        esophageal carcinoma    Social History Social History   Tobacco Use  . Smoking status: Never Smoker  . Smokeless tobacco: Never Used  Substance Use Topics  . Alcohol use: No  . Drug use: No     Allergies   Patient has no known allergies.   Review of Systems Review of Systems  All other systems reviewed and are negative.    Physical Exam Updated Vital Signs BP 112/66   Pulse 89   Temp 98 F (36.7 C) (Temporal)   Resp 15   Ht 5\' 6"  (1.676 m)   Wt 72.5 kg (159 lb 13.3 oz)   SpO2 100%   BMI 25.80 kg/m   Physical Exam  Constitutional: He appears well-developed and well-nourished. He appears lethargic. He appears ill.  HENT:  Head: Normocephalic and atraumatic.  Mouth/Throat: Oropharynx is clear and moist.  Eyes: Pupils are equal, round, and reactive to light. Conjunctivae and EOM are normal.  Neck: Normal range of motion. Neck supple.  Cardiovascular: Normal rate, regular rhythm and intact distal pulses. Frequent extrasystoles are present.  No murmur heard. Pulmonary/Chest: Effort normal and breath sounds normal. No respiratory distress. He has no wheezes. He has no rales.  Abdominal: Soft. He exhibits no distension. There is no tenderness. There is no rebound and no guarding.  Well healed surgical scars  Musculoskeletal: Normal range of motion. He exhibits no edema or tenderness.  Neurological: He appears lethargic. A cranial nerve deficit is present.  Left sided facial droop.  Able to follow commands and answer yes/no questions.  4/5 right upper/lower ext strength.  Pupils are equal.   Speech is without aphasia.  Pt cannot ambulate  Skin: Skin is warm and dry. No rash noted. No erythema.  Psychiatric: He has a normal mood and affect. His behavior is normal.  Nursing note and vitals reviewed.    ED Treatments / Results  Labs (all labs ordered are listed, but only abnormal results are displayed) Labs Reviewed  CBC - Abnormal; Notable for the following components:      Result Value   WBC 14.2 (*)    All other components within normal limits  DIFFERENTIAL - Abnormal; Notable for the following components:   Neutro Abs 12.0 (*)    All other components within normal limits  COMPREHENSIVE METABOLIC PANEL - Abnormal; Notable for the following components:   Sodium 133 (*)    Chloride 99 (*)    CO2 21 (*)  Glucose, Bld 217 (*)    Total Bilirubin 1.4 (*)    All other components within normal limits  GLUCOSE, CAPILLARY - Abnormal; Notable for the following components:   Glucose-Capillary 202 (*)    All other components within normal limits  GLUCOSE, CAPILLARY - Abnormal; Notable for the following components:   Glucose-Capillary 192 (*)    All other components within normal limits  I-STAT CHEM 8, ED - Abnormal; Notable for the following components:   Sodium 132 (*)    Chloride 99 (*)    BUN 21 (*)    Glucose, Bld 216 (*)    Calcium, Ion 0.94 (*)    All other components within normal limits  POCT I-STAT 3, ART BLOOD GAS (G3+) - Abnormal; Notable for the following components:   pO2, Arterial 363.0 (*)    All other components within normal limits  MRSA PCR SCREENING  ETHANOL  PROTIME-INR  APTT  HIV ANTIBODY (ROUTINE TESTING)  BLOOD GAS, ARTERIAL  TRIGLYCERIDES  HEMOGLOBIN AND HEMATOCRIT, BLOOD  I-STAT TROPONIN, ED  TYPE AND SCREEN  PREPARE RBC (CROSSMATCH)  ABO/RH    EKG EKG Interpretation  Date/Time:  Friday Apr 05 2018 09:39:22 EDT Ventricular Rate:  60 PR Interval:    QRS Duration: 90 QT Interval:  422 QTC Calculation: 422 R Axis:   -20 Text  Interpretation:  Sinus rhythm new Multiple premature complexes, vent & supraven Left ventricular hypertrophy Confirmed by Blanchie Dessert [id number]) on 04/05/2018 9:56:10 AM   Radiology Ct Head Code Stroke Wo Contrast  Result Date: 04/05/2018 CLINICAL DATA:  Code stroke. Acute focal neuro deficit. Right facial droop. Left facial weakness. Slurred speech. EXAM: CT HEAD WITHOUT CONTRAST TECHNIQUE: Contiguous axial images were obtained from the base of the skull through the vertex without intravenous contrast. COMPARISON:  CT head 06/11/2014 FINDINGS: Brain: Large hemorrhage in the left temporal lobe measuring 6.6 by 3.8 cm. Surrounding hypodense edema with moderate mass-effect. Intraventricular extension is present with early hydrocephalus. 13 mm midline shift to the right. No subdural or subarachnoid hemorrhage. No other areas of hemorrhage mass or acute infarction identified. Vascular: Negative for hyperdense vessel Skull: Negative Sinuses/Orbits: Negative Other: None ASPECTS ([place] Stroke Program Early CT Score) - Ganglionic level infarction (caudate, lentiform nuclei, internal capsule, insula, M1-M3 cortex): 7 - Supraganglionic infarction (M4-M6 cortex): 3 Total score (0-10 with 10 being normal): 10 IMPRESSION: 1. Large hemorrhage left temporal lobe with surrounding edema and mass-effect. Intraventricular hemorrhage with mild hydrocephalus. 13 mm midline shift. Etiologies of hemorrhage are not determined on this study but could be due to hypertension, cerebral amyloid, vascular malformation, or less likely tumor. 2. ASPECTS is 10 3. These results were called by telephone at the time of interpretation on 04/05/2018 at 9:42 am to Dr. Leonel Ramsay, who verbally acknowledged these results. Electronically Signed   By: Franchot Gallo M.D.   On: 04/05/2018 09:44    Procedures Procedures (including critical care time)  Medications Ordered in ED Medications  nicardipine (CARDENE) 20mg  in 0.86% saline 280ml IV  infusion (0.1 mg/ml) (5 mg/hr Intravenous New Bag/Given 04/05/18 0941)     Initial Impression / Assessment and Plan / ED Course  I have reviewed the triage vital signs and the nursing notes.  Pertinent labs & imaging results that were available during my care of the patient were reviewed by me and considered in my medical decision making (see chart for details).     57 year old gentleman presenting today with altered mental status, left-sided facial droop and right-sided  weakness.  Patient was last seen normal between 5 and 8 AM this morning and came in as a code stroke.  Patient is currently protecting his airway, following commands and answering questions.  Stroke team at bedside.  CT shows large intracranial hemorrhage in the left temporal lobe with surrounding edema and mass-effect.  Patient started on cardepine gtt for blood pressure control.  Frequent monitoring to ensure no decline in airway status.  Patient does not take any anticoagulation.  CTA to evaluate for AVM.  Neurosurgery consulted for further evaluation.  Patient will go to the neuro ICU.  Labs with mild leukocytosis of 14,000, blood sugar 200 normal renal function. Pt went to the OR for clot evacuation  CRITICAL CARE Performed by: [place] Weekly Total critical care time: 30 minutes Critical care time was exclusive of separately billable procedures and treating other patients. Critical care was necessary to treat or prevent imminent or life-threatening deterioration. Critical care was time spent personally by me on the following activities: development of treatment plan with patient and/or surrogate as well as nursing, discussions with consultants, evaluation of patient's response to treatment, examination of patient, obtaining history from patient or surrogate, ordering and performing treatments and interventions, ordering and review of laboratory studies, ordering and review of radiographic studies, pulse oximetry and  re-evaluation of patient's condition.   Final Clinical Impressions(s) / ED Diagnoses   Final diagnoses:  Intracranial hemorrhage [facility])    ED Discharge Orders    None       Blanchie Dessert, MD 04/05/18 1610

## 2018-04-05 NOTE — ED Triage Notes (Addendum)
Per ems- pt had colonoscopy yesterday at [facility], he was drowsy yesterday from procedure. This morning at 0500, he was alert, had slurred speech, LSN 1600 yesterday. At 43, daughter found him with vomit, altered, left sided gaze, right weakness. BP 164/86.

## 2018-04-05 NOTE — Op Note (Signed)
04/05/2018  12:59 PM  PATIENT:  Hector Pugh  57 y.o. male with a spontaneous intracerebral hemorrhage in the left temporal lobe with early transtentorial herniation. He will be taken to the operating room emergently for hematoma evacuation  PRE-OPERATIVE DIAGNOSIS:  Intracranial Hemorrhage  POST-OPERATIVE DIAGNOSIS:  Intracranial Hemorrhage  PROCEDURE:  Procedure(s): Left Temporal CRANIOTOMY for HEMATOMA EVACUATION of Intracranial Hemorrhage  SURGEON: Surgeon(s): Ashok Pall, MD Kary Kos, MD  ASSISTANTS:cram, gary  ANESTHESIA:   general  EBL:  Total I/O In: -  Out: 740 [Urine:640; Blood:100]  BLOOD ADMINISTERED:none  CELL SAVER GIVEN:none  COUNT:per nursing  DRAINS: none   SPECIMEN:  No Specimen  DICTATION: [name] was taken to the operating room, intubated, and placed under a general anesthetic without difficulty. He was positioned with his head turned towards the left on a donu. His head was shaved then it was prepped and draped in a sterile manner. I made a coronal incision just above the tragus. I placed a cerebellar retractor, divided the temporalis muscle with monopolar cautery, and turned a craniotomy in the Temporal bone. I removed the bone flap, and opened the dura in a cruciate fashion. I cauterized the surface the brain then used suction and the bipolar to dissect into the clot. I evacuated the clot with suction. I controlled bleeding points with cautery, and hemostatic agents. I irrigated copiously until I had hemostasis. I then laid some surgicel on the  brain surface, duragen over the brain, and replaced the bone flap with plates and screws. Dr. Saintclair Halsted assisted with the entire closure. We approximated the temporalis muscle and fascia, and galea with vicryl sutures. The scalp edges were approximated with staples. I applied a sterile dressing. He was taken directly to the ICU post op.   PLAN OF CARE: Admit to inpatient   PATIENT DISPOSITION:  ICU -  intubated and critically ill.   Delay start of Pharmacological VTE agent (>24hrs) due to surgical blood loss or risk of bleeding:  yes

## 2018-04-05 NOTE — Progress Notes (Addendum)
Initial Nutrition Assessment  DOCUMENTATION CODES:   Not applicable  INTERVENTION:   If unable to extubate pt and TF warranted, recommend placing Cortrak tube and initiating: - Vital High Protein @ 55 ml/hr (1320 kcal/day)  Tube feeding regimen provides 1320 kcal, 116 grams of protein, and 1102 ml of H2O.   Tube feeding regimen and current propofol provides 1543 total kcal (96% of estimated energy needs and 100% estimated protein needs).  NUTRITION DIAGNOSIS:   Inadequate oral intake related to inability to eat as evidenced by NPO status.  GOAL:   Patient will meet greater than or equal to 90% of their needs  MONITOR:   Vent status, I & O's, Labs, Weight trends  REASON FOR ASSESSMENT:   Ventilator    ASSESSMENT:   57 year old male who presented to ED on 04/05/18 with headache, vomiting, and AMS and was found to have large left ICH. Pt had colonoscopy the previous day at [facility] and was drowsy from procedure. PMH significant for biliary stricture s/p biliary stent in 2013, diabetes mellitus, hypertension, and pancreatic cancer s/p Whipple in 2013.  04/05/18 - intubated, emergent hematoma evacuation  Spoke with family at bedside who reports pt has a good appetite and typically eats 3 meals daily. Pt is vegetarian. Pt is also active at baseline with gardening and other activities. Per pt's family, pt has maintained his weight for the last several years.  Discussed pt with RN who states that the plan is to extubate pt tomorrow if he weans well.  Pt does not have enteral feeding access at present. If needed, recommend placing Cortrak feeding tube.  Patient is currently intubated on ventilator support MV: 8.5 L/min Temp (24hrs), Avg:98 F (36.7 C), Min:98 F (36.7 C), Max:98 F (36.7 C)  Propofol: 8.4 ml/hr (provides 223 kcal/day)  UOP: 740 ml since admission  Medications reviewed and include: sliding scale Novolog, Senokot-S daily, 25 mcg levothyroxine daily, 20 mg IV Pepcid  BID Drips: KCl Cardene  Labs reviewed: sodium 132 (L), BUN 21(H), chloride 99 (L), CO2 21 (L) CBG's: 202, 161 since admission  NUTRITION - FOCUSED PHYSICAL EXAM:    Most Recent Value  Orbital Region  Mild depletion  Upper Arm Region  No depletion  Thoracic and Lumbar Region  No depletion  Buccal Region  Unable to assess  Temple Region  Mild depletion  Clavicle Bone Region  No depletion  Clavicle and Acromion Bone Region  Mild depletion  Scapular Bone Region  Unable to assess  Dorsal Hand  No depletion  Patellar Region  No depletion  Anterior Thigh Region  Mild depletion  Posterior Calf Region  No depletion  Edema (RD Assessment)  None  Hair  Reviewed  Eyes  Unable to assess  Mouth  Unable to assess  Skin  Reviewed  Nails  Reviewed       Diet Order:   Diet Order           Diet NPO time specified  Diet effective now          EDUCATION NEEDS:   No education needs have been identified at this time  Skin:  Skin Assessment: Skin Integrity Issues: Skin Integrity Issues:: Incisions Incisions: head  Last BM:  unknown/PTA  Height:   Ht Readings from Last 1 Encounters:  04/05/18 5\' 6"  (1.676 m)    Weight:   Wt Readings from Last 1 Encounters:  04/05/18 159 lb 13.3 oz (72.5 kg)    Ideal Body Weight:  64.5 kg  BMI:  Body mass index is 25.8 kg/m.  Estimated Nutritional Needs:   Kcal:  1612 kcal/day (PSU 2003b)  Protein:  110-125 grams/day (1.5-1.7 g/kg)  Fluid:  1.8-2.0 L/day    Gaynell Face, MS, RD, LDN Pager: [phone] Weekend/After Hours: [phone]

## 2018-04-06 ENCOUNTER — Inpatient Hospital Stay (HOSPITAL_COMMUNITY): Payer: Managed Care, Other (non HMO)

## 2018-04-06 ENCOUNTER — Inpatient Hospital Stay: Payer: Self-pay

## 2018-04-06 DIAGNOSIS — J690 Pneumonitis due to inhalation of food and vomit: Secondary | ICD-10-CM

## 2018-04-06 DIAGNOSIS — A419 Sepsis, unspecified organism: Secondary | ICD-10-CM

## 2018-04-06 DIAGNOSIS — G936 Cerebral edema: Secondary | ICD-10-CM

## 2018-04-06 DIAGNOSIS — R7989 Other specified abnormal findings of blood chemistry: Secondary | ICD-10-CM

## 2018-04-06 DIAGNOSIS — J9601 Acute respiratory failure with hypoxia: Secondary | ICD-10-CM

## 2018-04-06 LAB — BASIC METABOLIC PANEL
Anion gap: 8 (ref 5–15)
BUN: 17 mg/dL (ref 6–20)
CALCIUM: 7.6 mg/dL — AB (ref 8.9–10.3)
CO2: 24 mmol/L (ref 22–32)
CREATININE: 0.94 mg/dL (ref 0.61–1.24)
Chloride: 105 mmol/L (ref 101–111)
GFR calc Af Amer: >60 mL/min (ref >60)
GFR calc non Af Amer: >60 mL/min (ref >60)
GLUCOSE: 128 mg/dL — AB (ref 65–99)
Potassium: 3.9 mmol/L (ref 3.5–5.1)
SODIUM: 137 mmol/L (ref 135–145)

## 2018-04-06 LAB — URINALYSIS, ROUTINE W REFLEX MICROSCOPIC
Bacteria, UA: NONE SEEN
Bilirubin Urine: NEGATIVE
Glucose, UA: NEGATIVE mg/dL
Hgb urine dipstick: NEGATIVE
Ketones, ur: 5 mg/dL — AB
Nitrite: NEGATIVE
Protein, ur: NEGATIVE mg/dL
Specific Gravity, Urine: 1.028 (ref 1.005–1.030)
pH: 5 (ref 5.0–8.0)

## 2018-04-06 LAB — LACTIC ACID, PLASMA: Lactic Acid, Venous: 1.1 mmol/L (ref 0.5–1.9)

## 2018-04-06 LAB — CBC
HCT: 34.6 % — ABNORMAL LOW (ref 39.0–52.0)
Hemoglobin: 11.3 g/dL — ABNORMAL LOW (ref 13.0–17.0)
MCH: 28 pg (ref 26.0–34.0)
MCHC: 32.7 g/dL (ref 30.0–36.0)
MCV: 85.9 fL (ref 78.0–100.0)
PLATELETS: 159 10*3/uL (ref 150–400)
RBC: 4.03 MIL/uL — ABNORMAL LOW (ref 4.22–5.81)
RDW: 13.2 % (ref 11.5–15.5)
WBC: 11.1 10*3/uL — ABNORMAL HIGH (ref 4.0–10.5)

## 2018-04-06 LAB — BLOOD GAS, ARTERIAL
ACID-BASE EXCESS: 0.4 mmol/L (ref 0.0–2.0)
Bicarbonate: 24.3 mmol/L (ref 20.0–28.0)
DRAWN BY: 517021
FIO2: 0.3
MECHVT: 510 mL
O2 Saturation: 99.1 %
PCO2 ART: 37.8 mmHg (ref 32.0–48.0)
PEEP: 5 cmH2O
PO2 ART: 141 mmHg — AB (ref 83.0–108.0)
Patient temperature: 98.6
RATE: 15 resp/min
pH, Arterial: 7.425 (ref 7.350–7.450)

## 2018-04-06 LAB — RAPID URINE DRUG SCREEN, HOSP PERFORMED
Amphetamines: NOT DETECTED
BARBITURATES: NOT DETECTED
BENZODIAZEPINES: POSITIVE — AB
COCAINE: NOT DETECTED
Opiates: NOT DETECTED
TETRAHYDROCANNABINOL: NOT DETECTED

## 2018-04-06 LAB — GLUCOSE, CAPILLARY
GLUCOSE-CAPILLARY: 125 mg/dL — AB (ref 65–99)
GLUCOSE-CAPILLARY: 121 mg/dL — AB (ref 65–99)
GLUCOSE-CAPILLARY: 82 mg/dL (ref 65–99)
Glucose-Capillary: 113 mg/dL — ABNORMAL HIGH (ref 65–99)
Glucose-Capillary: 97 mg/dL (ref 65–99)

## 2018-04-06 LAB — HEMOGLOBIN A1C
HEMOGLOBIN A1C: 6.8 % — AB (ref 4.8–5.6)
MEAN PLASMA GLUCOSE: 148.46 mg/dL

## 2018-04-06 LAB — LIPID PANEL
Cholesterol: 99 mg/dL (ref 0–200)
HDL: 34 mg/dL — ABNORMAL LOW (ref >40)
LDL Cholesterol: 45 mg/dL (ref 0–99)
Total CHOL/HDL Ratio: 2.9 RATIO
Triglycerides: 98 mg/dL (ref <150)
VLDL: 20 mg/dL (ref 0–40)

## 2018-04-06 LAB — SODIUM
SODIUM: 137 mmol/L (ref 135–145)
Sodium: 141 mmol/L (ref 135–145)
Sodium: 143 mmol/L (ref 135–145)

## 2018-04-06 LAB — HIV ANTIBODY (ROUTINE TESTING W REFLEX): HIV Screen 4th Generation wRfx: NONREACTIVE

## 2018-04-06 MED ORDER — FENTANYL CITRATE (PF) 100 MCG/2ML IJ SOLN: 25.0000 ug | INTRAMUSCULAR | Status: DC | PRN

## 2018-04-06 MED ORDER — SENNOSIDES 8.8 MG/5ML PO SYRP
5.0000 mL | ORAL_SOLUTION | Freq: Every day | ORAL | Status: DC
  Administered 2018-04-07 – 2018-04-12 (×2): 5 mL
  Filled 2018-04-06 (×4): qty 5

## 2018-04-06 MED ORDER — DOCUSATE SODIUM 50 MG/5ML PO LIQD
100.0000 mg | Freq: Two times a day (BID) | ORAL | Status: DC
  Administered 2018-04-07 (×2): 100 mg
  Filled 2018-04-06 (×2): qty 10

## 2018-04-06 MED ORDER — LACTATED RINGERS IV SOLN
INTRAVENOUS | Status: DC
  Administered 2018-04-06: 22:00:00 via INTRAVENOUS

## 2018-04-06 MED ORDER — SODIUM CHLORIDE 3 % IV SOLN
INTRAVENOUS | Status: DC
  Administered 2018-04-06 – 2018-04-07 (×2): 50 mL/h via INTRAVENOUS
  Administered 2018-04-08 – 2018-04-09 (×3): 75 mL/h via INTRAVENOUS
  Administered 2018-04-09: 50 mL/h via INTRAVENOUS
  Administered 2018-04-09 – 2018-04-10 (×2): 75 mL/h via INTRAVENOUS
  Administered 2018-04-10: 37.5 mL/h via INTRAVENOUS
  Filled 2018-04-06 (×26): qty 500

## 2018-04-06 MED ORDER — SODIUM CHLORIDE 0.9 % IV BOLUS
500.0000 mL | Freq: Once | INTRAVENOUS | Status: AC
  Administered 2018-04-06: 500 mL via INTRAVENOUS

## 2018-04-06 NOTE — Progress Notes (Signed)
Pharmacy Antibiotic Note  Hector Pugh is a 57 y.o. male admitted on 04/05/2018 with pneumonia.  Pharmacy has been consulted for zosyn dosing. -WBC= 11.1, tmax= 100.8 -CrCl ~ 80  Plan: -Zosyn 3.375gm IV q8h -Will follow renal function, cultures and clinical progress   Height: 5\' 6"  (167.6 cm) Weight: 159 lb 13.3 oz (72.5 kg) IBW/kg (Calculated) : 63.8  Temp (24hrs), Avg:100.1 F (37.8 C), Min:99.6 F (37.6 C), Max:100.8 F (38.2 C)  Recent Labs  Lab 04/05/18 0925 04/05/18 0935 04/06/18 0312  WBC 14.2*  --  11.1*  CREATININE 0.94 0.70 0.94    Estimated Creatinine Clearance: 77.3 mL/min (by C-G formula based on SCr of 0.94 mg/dL).    No Known Allergies  Antimicrobials this admission: 5/4 zosyn>>  Dose adjustments this admission:  Microbiology results: 5/4 blood x2 5/3 MRSA PCR- neg   Thank you for allowing pharmacy to be a part of this patient's care.  Hildred Laser, PharmD Clinical Pharmacist 04/06/2018 9:33 PM

## 2018-04-06 NOTE — Plan of Care (Signed)
Patient responding to pain and withdraws on all extremities. Intermittently opens eyes spontaneously and attempts to pull at gown/wires, but will not follow commands. Family at bedside.

## 2018-04-06 NOTE — Progress Notes (Signed)
PULMONARY / CRITICAL CARE MEDICINE   Name: Hector Pugh MRN: [id number] DOB: June 10, 1961    ADMISSION DATE:  04/05/2018   CHIEF COMPLAINT:  ICH  HISTORY OF PRESENT ILLNESS:   57 y/o M who presented to [facility] on 5/3 with reports of headache, vomiting and altered mental status.  The patient was in his usual state of health until 5/3 am.  He carries a history of pancreatic cancer s/p whipple (2013 / considered cure) with subsequent development of DM.  Prior to admit, he was functional / working full time.  He had a screening colonoscopy on 5/2 which was reportedly clean.  Family reports that the patient complained of headache on the am of 5/2 which they thought was associated with a lack of caffeine intake while NPO for procedure.  During the procedure, he reportedly received 8 mg versed and 50 mcg of fentanyl.  After the procedure, he was sleepy but appropriate.  The patient woke around 0500 and was appropriate.  His wife went to work as usual.  At some point between 0500 and 0800, he went to the kitchen to make tea and went back up stairs.  His wife called to check on him around 0800 and he did not answer the phone.  She called her daughter, who is an ICU nurse, to check on the patient.  On arrival, the patient was reportedly aphasic and had emesis on his shirt and in the bed.  EMS was activated.  He vomited again with EMS transport.  He presented with 4/5 strength per Neurology but did not make it to the OR with 4/5.  Work up in [place] revealed a large temporal hemorrhage.  The patient was taken emergently to the OR for evacuation.  He was returned to ICU on vent postoperatively.   5/4: Patient was seen and examined bedside. [place] reviewed with neurology. Discussed the case with neurology and with family at bedside. Will attempt to extubate   PAST MEDICAL HISTORY :  He  has a past medical history of Biliary stricture (11/11/2012), Cancer ([place]), Diabetes mellitus ([place]) (11/10/2012), Diabetes mellitus without  complication ([place]), HTN (hypertension) (11/10/2012), Hypertension, Obstructive jaundice (11/11/2012), Pancreatic cancer ([place]), Pancreatitis (11/10/2012), and Seasonal allergies.  PAST SURGICAL HISTORY: He  has a past surgical history that includes circucision; Hernia repair; Left Foot Surgery; Vasectomy; circumsision; Left Foot Surgery; EUS (11/08/2012); Fine needle aspiration (11/08/2012); ERCP (11/08/2012); laparoscopy (12/12/2012); Whipple procedure (12/12/2012); and Colonoscopy (N/A, 04/04/2018).  No Known Allergies  No current facility-administered medications on file prior to encounter.    Current Outpatient Medications on File Prior to Encounter  Medication Sig  . acetaminophen (TYLENOL) 500 MG tablet Take 500 mg by mouth every 4 (four) hours as needed for moderate pain or headache.   Marland Kitchen glipiZIDE (GLUCOTROL XL) 5 MG 24 hr tablet Take 5 mg by mouth daily with breakfast.   . levothyroxine (SYNTHROID, LEVOTHROID) 50 MCG tablet Take 50 mcg by mouth daily before breakfast.  . losartan (COZAAR) 50 MG tablet Take 50 mg by mouth daily.  . tamsulosin (FLOMAX) 0.4 MG CAPS capsule Take 0.4 mg by mouth daily.    FAMILY HISTORY:  His indicated that his mother is deceased. He indicated that his father is deceased.   SOCIAL HISTORY: He  reports that he has never smoked. He has never used smokeless tobacco. He reports that he does not drink alcohol or use drugs.  REVIEW OF SYSTEMS:   unattainable   SUBJECTIVE:  Elderly male appropriate for age lying  in bed comfortably with fentanyl and propofol  VITAL SIGNS: BP 132/83   Pulse 64   Temp 99.6 F (37.6 C) (Axillary)   Resp 15   Ht 5\' 6"  (1.676 m)   Wt 159 lb 13.3 oz (72.5 kg)   SpO2 100%   BMI 25.80 kg/m   HEMODYNAMICS:    VENTILATOR SETTINGS: Vent Mode: PRVC FiO2 (%):  [30 %-100 %] 30 % Set Rate:  [15 bmp] 15 bmp Vt Set:  [510 mL] 510 mL PEEP:  [5 cmH20] 5 cmH20 Plateau Pressure:  [14 cmH20-17 cmH20] 14 cmH20  INTAKE / OUTPUT: I/O  last 3 completed shifts: In: 3101.4 [I.V.:2451.2; IV Piggyback:650.1] Out: 2140 [Urine:2040; Blood:100]  PHYSICAL EXAMINATION: General: Elderly male patient lying in bed comfortably on fentanyl and propofol. Neuro: Opens eyes spontaneously.  Moves head spontaneously.  Moves bilateral lower extremities spontaneously.  Pupils are equal round reactive to light.  Patient withdraws left upper extremity bilateral lower extremities to pain.  Patient does not withdraw right upper extremity to pain. Cardiovascular: S1-S2 regular rate and rhythm Lungs: On mechanical ventilator.  Good air entry bilaterally.  No wheezes rales or rhonchi. Abdomen: Abdomen soft nontender positive bowel sounds in all quadrants Musculoskeletal: Positive pulses in all 4 extremities.  No lower extremity edema noted. Skin: Intact  LABS:  BMET Recent Labs  Lab 04/05/18 0925 04/05/18 0935 04/06/18 0312 04/06/18 1036  NA 133* 132* 137 137  K 4.0 3.9 3.9  --   CL 99* 99* 105  --   CO2 21*  --  24  --   BUN 17 21* 17  --   CREATININE 0.94 0.70 0.94  --   GLUCOSE 217* 216* 128*  --     Electrolytes Recent Labs  Lab 04/05/18 0925 04/06/18 0312  CALCIUM 9.0 7.6*    CBC Recent Labs  Lab 04/05/18 0925 04/05/18 0935 04/05/18 1741 04/06/18 0312  WBC 14.2*  --   --  11.1*  HGB 15.2 16.0 13.1 11.3*  HCT 45.3 47.0 38.6* 34.6*  PLT 159  --   --  159    Coag's Recent Labs  Lab 04/05/18 0925  APTT 26  INR 1.18    Sepsis Markers No results for input(s): LATICACIDVEN, PROCALCITON, O2SATVEN in the last 168 hours.  ABG Recent Labs  Lab 04/05/18 1433  PHART 7.373  PCO2ART 40.5  PO2ART 363.0*    Liver Enzymes Recent Labs  Lab 04/05/18 0925  AST 23  ALT 20  ALKPHOS 44  BILITOT 1.4*  ALBUMIN 4.0    Cardiac Enzymes No results for input(s): TROPONINI, PROBNP in the last 168 hours.  Glucose Recent Labs  Lab 04/05/18 1538 04/05/18 2003 04/05/18 2318 04/06/18 0340 04/06/18 0718  04/06/18 1130  GLUCAP 192* 131* 110* 125* 121* 82    Imaging Ct Head Wo Contrast  Result Date: 04/06/2018 CLINICAL DATA:  Craniotomy for evacuation of left temporal lobe hematoma 04/05/2018 EXAM: CT HEAD WITHOUT CONTRAST TECHNIQUE: Contiguous axial images were obtained from the base of the skull through the vertex without intravenous contrast. COMPARISON:  Pre and postoperative head CTs 04/05/2018. FINDINGS: Brain: Residual blood products in the left temporal lobe are similar to the postoperative exam. Intraventricular hemorrhage is again noted, left greater than right. There is no hydrocephalus. Midline shift just above the foramen of Monro measures 6 mm, unchanged. No new hemorrhage is present. Edema surrounding the left temporal hemorrhage is stable. Minimal extra-axial blood products are stable. Brainstem and cerebellum are within normal limits.  Vascular: Atherosclerotic calcifications are present at the dural margin of both vertebral arteries and within the cavernous internal carotid arteries without a hyperdense vessel. Skull: Left temporoparietal craniotomy is again noted. Calvarium is otherwise intact. No focal lytic or blastic lesions are present. Sinuses/Orbits: The paranasal sinuses and mastoid air cells are clear. Globes and orbits are within normal limits. IMPRESSION: 1. Similar postoperative appearance of residual blood products in the left temporal lobe following partial evacuation of the hematoma. 2. Stable intraventricular hemorrhage without hydrocephalus. 3. 6 mm left-to-right midline shift is stable. Electronically Signed   By: San Morelle M.D.   On: 04/06/2018 09:13   Ct Head Wo Contrast  Result Date: 04/05/2018 CLINICAL DATA:  57 y/o  M; intracranial hemorrhage for follow-up. EXAM: CT HEAD WITHOUT CONTRAST TECHNIQUE: Contiguous axial images were obtained from the base of the skull through the vertex without intravenous contrast. COMPARISON:  03/06/2018 CT head and CTA head.  FINDINGS: Brain: Interval partial evacuation of hematoma within the left temporal lobe residual hemorrhage measures 4.4 x 2.8 x 3.1 cm (volume = 20 cm^3). There are small foci of air within the evacuation cavity and extra-axial space from the evacuation. Surrounding edema and mass effect is decreased with improved patency of the lateral ventricles and decreased midline shift with 6 mm residual left-to-right midline shift. There is a small volume of subarachnoid hemorrhage predominantly over the left cerebral convexity and stable volume of intraventricular hemorrhage within the lateral ventricles. No new acute brain parenchymal hemorrhage, stroke, or focus of mass effect is identified. Vascular: No hyperdense vessel or unexpected calcification. Skull: Postsurgical changes related to a left lateral craniotomy with edema in the overlying scalp. Sinuses/Orbits: No acute finding. Other: None. IMPRESSION: 1. Postsurgical changes from interval partial evacuation of hematoma within the left temporal lobe with approximately 20 cc residual. 2. Stable small volume of intraventricular hemorrhage and small volume of subarachnoid hemorrhage predominantly over left convexity. 3. Decreased mass effect with 6 mm left-to-right midline shift, previously 13 mm. Improved patency of lateral ventricles. 4. No new acute intracranial abnormality identified. Electronically Signed   By: Kristine Garbe M.D.   On: 04/05/2018 22:28   Dg Chest Port 1 View  Result Date: 04/06/2018 CLINICAL DATA:  Acute respiratory failure with hypoxia EXAM: PORTABLE CHEST 1 VIEW COMPARISON:  Chest radiograph from one day prior. FINDINGS: Endotracheal tube tip is 3.7 cm above the carina. Stable cardiomediastinal silhouette with normal heart size. No pneumothorax. No pleural effusion. Lungs appear clear, with no acute consolidative airspace disease and no pulmonary edema. IMPRESSION: Well-positioned endotracheal tube. No active cardiopulmonary disease.  Electronically Signed   By: Ilona Sorrel M.D.   On: 04/06/2018 09:13   Dg Chest Port 1 View  Result Date: 04/05/2018 CLINICAL DATA:  Acute respiratory insufficiency EXAM: PORTABLE CHEST 1 VIEW COMPARISON:  06/11/2014 FINDINGS: Endotracheal tube terminates 3 cm above the carina. Lungs are clear.  No pleural effusion or pneumothorax. The heart is normal in size. IMPRESSION: Endotracheal tube terminates 3 cm above the carina. Electronically Signed   By: Julian Hy M.D.   On: 04/05/2018 14:59     STUDIES:   CTA Head 5/3 >> stable left temporal lobe hemorrhage with left to right midline shift. Lobar hypertensive bleed is favored. No underlying vascular anomaly, or arterial / venous thrombosis/embolus.      SIGNIFICANT EVENTS: 5/03  Admit with AMS, headache, vomiting.  L ICH / hypertensive bleed     LINES/TUBES: ETT 5/3 >>    DISCUSSION: 56  y/o M admitted with large left ICH.  To OR 5/3 for evacuation of bleed.     ASSESSMENT / PLAN:  PULMONARY A: Ventilator dependent respiratory failure secondary to intracranial hemorrhage  P:   Vap precautions PRVC 8 cc/kg Patient saturating well on minimal vent settings Attempt wean and extubation today  CARDIOVASCULAR A:  Hypertension P:  Goal systolic blood pressure [phone] Cardene drip as needed We will replace arterial line for closer blood pressure monitoring  RENAL A:   No acute issues P:   Trend ins and outs Replace electrolytes as needed Every 6 hour sodium while patient is on hypertonic saline  GASTROINTESTINAL A:   History of pancreatic cancer status post Whipple 2013 P:   N.p.o. OGT  HEMATOLOGIC A:   Leukocytosis secondary to reactive Thrombocytopenia secondary to consumption most likely P:  Trend daily CBC SCDs for DVT prophylaxis  INFECTIOUS A:   No acute issues at this time P:   We will continue to trend WBC. No antibiotics at this time Trend fever curve  ENDOCRINE A:   Diabetes  secondary to Whipple P:   Insulin sliding scale Follow-up TSH  NEUROLOGIC A:   Large left intracranial hemorrhage P:   Rest goal 0 to -1 CT head from 04/06/2018 reviewed.  Similar to previous CT head done.  No enlargement of bleed.  Mild to moderate edema We will start the patient on hypertonic saline. Keppra 500 mg every 12 for seizure prophylaxis Repeat CT head in 24 hours PT OT speech therapy when able   FAMILY  - Updates: Family is at bedside.  Discussed the case extensively with family.  We will try to extubate the patient today.  Patient family very engaged and supportive.    Rojelio Brenner Pulmonary Critical Care Pager: [phone]  04/06/2018, 12:25 PM

## 2018-04-06 NOTE — Progress Notes (Signed)
Called for central line insertion by Neurology.  For infusions  less than 23ml/hr of 3% saline can be safely administered via PIV.  Will hold central line for now.  Reviewed with family at bedside.  Please call if plan of care changes. Plan discussed with Dr. Marcello Moores.   Noe Gens, NP-C  Pulmonary & Critical Care Pgr: [phone] or if no answer [phone] 04/06/2018, 12:21 PM

## 2018-04-06 NOTE — Progress Notes (Signed)
STROKE TEAM PROGRESS NOTE   SUBJECTIVE (INTERVAL HISTORY) His daughter is at the bedside.  Overall he feels his condition is rapidly improving. He is awake alert, eyes open, still intubated, not following commands though. Not in acute distress. Still on low dose propofol, BP on the low side, not on cardene. Repeat CT head showed stable hematoma after evacuation and stable midline shift. On waning trial, and plan for extubation today.    OBJECTIVE Temp:  [97.2 F (36.2 C)-100.1 F (37.8 C)] 99.6 F (37.6 C) (05/04 0800) Pulse Rate:  [62-101] 84 (05/04 1000) Cardiac Rhythm: Normal sinus rhythm (05/04 0800) Resp:  [14-26] 17 (05/04 1000) BP: (93-145)/(23-83) 129/79 (05/04 1000) SpO2:  [100 %] 100 % (05/04 1000) Arterial Line BP: (93-171)/(53-98) 150/80 (05/04 1000) FiO2 (%):  [30 %-100 %] 30 % (05/04 0800) Weight:  [159 lb 13.3 oz (72.5 kg)] 159 lb 13.3 oz (72.5 kg) (05/03 1313)  Recent Labs  Lab 04/05/18 0927 04/05/18 1538 04/05/18 2003 04/05/18 2318 04/06/18 0340  GLUCAP 202* 192* 131* 110* 125*   Recent Labs  Lab 04/05/18 0925 04/05/18 0935 04/06/18 0312  NA 133* 132* 137  K 4.0 3.9 3.9  CL 99* 99* 105  CO2 21*  --  24  GLUCOSE 217* 216* 128*  BUN 17 21* 17  CREATININE 0.94 0.70 0.94  CALCIUM 9.0  --  7.6*   Recent Labs  Lab 04/05/18 0925  AST 23  ALT 20  ALKPHOS 44  BILITOT 1.4*  PROT 6.7  ALBUMIN 4.0   Recent Labs  Lab 04/05/18 0925 04/05/18 0935 04/05/18 1741 04/06/18 0312  WBC 14.2*  --   --  11.1*  NEUTROABS 12.0*  --   --   --   HGB 15.2 16.0 13.1 11.3*  HCT 45.3 47.0 38.6* 34.6*  MCV 84.7  --   --  85.9  PLT 159  --   --  159   No results for input(s): CKTOTAL, CKMB, CKMBINDEX, TROPONINI in the last 168 hours. Recent Labs    04/05/18 0925  LABPROT 14.9  INR 1.18   Recent Labs    04/06/18 0930  COLORURINE YELLOW  LABSPEC 1.028  PHURINE 5.0  GLUCOSEU NEGATIVE  HGBUR NEGATIVE  BILIRUBINUR NEGATIVE  KETONESUR 5*  PROTEINUR NEGATIVE   NITRITE NEGATIVE  LEUKOCYTESUR TRACE*       Component Value Date/Time   CHOL 99 04/06/2018 0325   TRIG 98 04/06/2018 0325   HDL 34 (L) 04/06/2018 0325   CHOLHDL 2.9 04/06/2018 0325   VLDL 20 04/06/2018 0325   LDLCALC 45 04/06/2018 0325   Lab Results  Component Value Date   HGBA1C 6.8 (H) 04/06/2018      Component Value Date/Time   LABOPIA NONE DETECTED 04/06/2018 0504   COCAINSCRNUR NONE DETECTED 04/06/2018 0504   LABBENZ POSITIVE (A) 04/06/2018 0504   AMPHETMU NONE DETECTED 04/06/2018 0504   THCU NONE DETECTED 04/06/2018 0504   LABBARB NONE DETECTED 04/06/2018 0504    Recent Labs  Lab 04/05/18 0925  ETH <10    I have personally reviewed the radiological images below and agree with the radiology interpretations.  Ct Angio Head W Or Wo Contrast  Result Date: 04/05/2018 CLINICAL DATA:  Acute LEFT temporal lobe hemorrhage. Patient has history of hypertension. EXAM: CT ANGIOGRAPHY HEAD TECHNIQUE: Multidetector CT imaging of the head was performed using the standard protocol during bolus administration of intravenous contrast. Multiplanar CT image reconstructions and MIPs were obtained to evaluate the vascular anatomy. CONTRAST:  18mL ISOVUE-370 IOPAMIDOL (ISOVUE-370) INJECTION 76% COMPARISON:  Code stroke CT earlier today. FINDINGS: CTA HEAD Anterior circulation: Large LEFT temporal lobe hemorrhage, with intraventricular extension, appears to be stable from prior code stroke CT. Cross-section of 37 x 59 x 33 mm, volume ~36 mL, of the hematoma proper, not including the intraventricular component. LEFT-to-RIGHT shift, 8 mm, including uncal herniation. No visible proximal vascular occlusion, embolus, arteriovenous malformation, saccular aneurysm, RCVS, vasculitis, active extravasation, or venous thrombosis. Posterior circulation: No significant stenosis, proximal occlusion, aneurysm, or vascular malformation. Venous sinuses: As permitted by contrast timing, patent. Anatomic variants:  None. Delayed phase: Not performed. IMPRESSION: Stable LEFT temporal lobe hemorrhage, with LEFT-to-RIGHT midline shift. Lobar hypertensive bleed is favored. No underlying vascular anomaly, or arterial/venous thrombosis/embolus. Findings discussed with ordering provider Cabbell MD at approximately 10:40 a.m. Electronically Signed   By: Staci Righter M.D.   On: 04/05/2018 11:12   Ct Head Wo Contrast  Result Date: 04/06/2018 CLINICAL DATA:  Craniotomy for evacuation of left temporal lobe hematoma 04/05/2018 EXAM: CT HEAD WITHOUT CONTRAST TECHNIQUE: Contiguous axial images were obtained from the base of the skull through the vertex without intravenous contrast. COMPARISON:  Pre and postoperative head CTs 04/05/2018. FINDINGS: Brain: Residual blood products in the left temporal lobe are similar to the postoperative exam. Intraventricular hemorrhage is again noted, left greater than right. There is no hydrocephalus. Midline shift just above the foramen of Monro measures 6 mm, unchanged. No new hemorrhage is present. Edema surrounding the left temporal hemorrhage is stable. Minimal extra-axial blood products are stable. Brainstem and cerebellum are within normal limits. Vascular: Atherosclerotic calcifications are present at the dural margin of both vertebral arteries and within the cavernous internal carotid arteries without a hyperdense vessel. Skull: Left temporoparietal craniotomy is again noted. Calvarium is otherwise intact. No focal lytic or blastic lesions are present. Sinuses/Orbits: The paranasal sinuses and mastoid air cells are clear. Globes and orbits are within normal limits. IMPRESSION: 1. Similar postoperative appearance of residual blood products in the left temporal lobe following partial evacuation of the hematoma. 2. Stable intraventricular hemorrhage without hydrocephalus. 3. 6 mm left-to-right midline shift is stable. Electronically Signed   By: San Morelle M.D.   On: 04/06/2018 09:13    Ct Head Wo Contrast  Result Date: 04/05/2018 CLINICAL DATA:  57 y/o  M; intracranial hemorrhage for follow-up. EXAM: CT HEAD WITHOUT CONTRAST TECHNIQUE: Contiguous axial images were obtained from the base of the skull through the vertex without intravenous contrast. COMPARISON:  03/06/2018 CT head and CTA head. FINDINGS: Brain: Interval partial evacuation of hematoma within the left temporal lobe residual hemorrhage measures 4.4 x 2.8 x 3.1 cm (volume = 20 cm^3). There are small foci of air within the evacuation cavity and extra-axial space from the evacuation. Surrounding edema and mass effect is decreased with improved patency of the lateral ventricles and decreased midline shift with 6 mm residual left-to-right midline shift. There is a small volume of subarachnoid hemorrhage predominantly over the left cerebral convexity and stable volume of intraventricular hemorrhage within the lateral ventricles. No new acute brain parenchymal hemorrhage, stroke, or focus of mass effect is identified. Vascular: No hyperdense vessel or unexpected calcification. Skull: Postsurgical changes related to a left lateral craniotomy with edema in the overlying scalp. Sinuses/Orbits: No acute finding. Other: None. IMPRESSION: 1. Postsurgical changes from interval partial evacuation of hematoma within the left temporal lobe with approximately 20 cc residual. 2. Stable small volume of intraventricular hemorrhage and small volume of subarachnoid hemorrhage predominantly  over left convexity. 3. Decreased mass effect with 6 mm left-to-right midline shift, previously 13 mm. Improved patency of lateral ventricles. 4. No new acute intracranial abnormality identified. Electronically Signed   By: Kristine Garbe M.D.   On: 04/05/2018 22:28   Dg Chest Port 1 View  Result Date: 04/06/2018 CLINICAL DATA:  Acute respiratory failure with hypoxia EXAM: PORTABLE CHEST 1 VIEW COMPARISON:  Chest radiograph from one day prior. FINDINGS:  Endotracheal tube tip is 3.7 cm above the carina. Stable cardiomediastinal silhouette with normal heart size. No pneumothorax. No pleural effusion. Lungs appear clear, with no acute consolidative airspace disease and no pulmonary edema. IMPRESSION: Well-positioned endotracheal tube. No active cardiopulmonary disease. Electronically Signed   By: Ilona Sorrel M.D.   On: 04/06/2018 09:13   Dg Chest Port 1 View  Result Date: 04/05/2018 CLINICAL DATA:  Acute respiratory insufficiency EXAM: PORTABLE CHEST 1 VIEW COMPARISON:  06/11/2014 FINDINGS: Endotracheal tube terminates 3 cm above the carina. Lungs are clear.  No pleural effusion or pneumothorax. The heart is normal in size. IMPRESSION: Endotracheal tube terminates 3 cm above the carina. Electronically Signed   By: Julian Hy M.D.   On: 04/05/2018 14:59   Ct Head Code Stroke Wo Contrast  Result Date: 04/05/2018 CLINICAL DATA:  Code stroke. Acute focal neuro deficit. Right facial droop. Left facial weakness. Slurred speech. EXAM: CT HEAD WITHOUT CONTRAST TECHNIQUE: Contiguous axial images were obtained from the base of the skull through the vertex without intravenous contrast. COMPARISON:  CT head 06/11/2014 FINDINGS: Brain: Large hemorrhage in the left temporal lobe measuring 6.6 by 3.8 cm. Surrounding hypodense edema with moderate mass-effect. Intraventricular extension is present with early hydrocephalus. 13 mm midline shift to the right. No subdural or subarachnoid hemorrhage. No other areas of hemorrhage mass or acute infarction identified. Vascular: Negative for hyperdense vessel Skull: Negative Sinuses/Orbits: Negative Other: None ASPECTS (Theod[facility]ia Stroke Program Early CT Score) - Ganglionic level infarction (caudate, lentiform nuclei, internal capsule, insula, M1-M3 cortex): 7 - Supraganglionic infarction (M4-M6 cortex): 3 Total score (0-10 with 10 being normal): 10 IMPRESSION: 1. Large hemorrhage left temporal lobe with surrounding edema and  mass-effect. Intraventricular hemorrhage with mild hydrocephalus. 13 mm midline shift. Etiologies of hemorrhage are not determined on this study but could be due to hypertension, cerebral amyloid, vascular malformation, or less likely tumor. 2. ASPECTS is 10 3. These results were called by telephone at the time of interpretation on 04/05/2018 at 9:42 am to Dr. Leonel Ramsay, who verbally acknowledged these results. Electronically Signed   By: Franchot Gallo M.D.   On: 04/05/2018 09:44    TTE pending   PHYSICAL EXAM  Temp:  [97.2 F (36.2 C)-100.1 F (37.8 C)] 99.6 F (37.6 C) (05/04 0800) Pulse Rate:  [62-101] 84 (05/04 1000) Resp:  [14-26] 17 (05/04 1000) BP: (93-145)/(23-83) 129/79 (05/04 1000) SpO2:  [100 %] 100 % (05/04 1000) Arterial Line BP: (93-171)/(53-98) 150/80 (05/04 1000) FiO2 (%):  [30 %-100 %] 30 % (05/04 0800) Weight:  [159 lb 13.3 oz (72.5 kg)] 159 lb 13.3 oz (72.5 kg) (05/03 1313)  General - Well nourished, well developed, in no apparent distress, intubated.  Ophthalmologic - fundi not visualized due to noncooperation.  Cardiovascular - Regular rate and rhythm.  Neuro - awake alert, eyes open, intubated, not in distress, however, not following commands. PERRL, left gaze preference, did not cross midline, left gaze incomplete. Left mild ptosis, not tracking, blinking to visual threat on the left, but not on the right. LUE spontaneous  movement, but not cooperative on strength testing, drift to bed. LLE 3/5 on pain stimulation. RLE 2/5 on pain and RUE mild withdraw. DTR 1+ and no babinski. Sensation, coordination and gait not tested.   ASSESSMENT/PLAN [name] is a 57 y.o. male with history of pancreatic endocrine tumor s/p wipple procedure in 2012, DM, HTN admitted for slurry speech, N/V and right sided weakness. No tPA given due to [place].    ICH - left temporal ICH s/p hematoma evacuation, etiology unclear  Resultant right hemiparesis, ? Right hemianopia, ?  aphasia  CT head left temporal large ICH, with severe midline shift  CTA no AVM or aneurysm  Repeat CT 04/06/18 - stable hematoma s/p evacuation, midline much improved  2D Echo  pending  LDL 45  HgbA1c 6.8  SCDs for VTE prophylaxis  NPO  No antithrombotic prior to admission, now on No antithrombotic.   Continue keppra for seizure prevention  Ongoing aggressive stroke risk factor management  Therapy recommendations:  Pending   Disposition:  Pending   Cerebral edema  CT 04/05/18 left temporal large ICH with severe midline shift  S/p hematoma evacation  CT repeat 04/06/18 midline shift 5.73mm, much improved  Will start 3% saline today  Na goal 150-155  Na Q6h monitoring  Diabetes  HgbA1c 6.8 goal < 7.0  Controlled  CBG monitoring  SSI  Hypertension BP on the low end  BP goal < 140  Not on cardene  Other Stroke Risk Factors    Other Active Problems  Pancreatic endocrine tumor s/p wipple procedure in 2012  Hospital day # 1  This patient is critically ill due to Solomon s/p hematoma evacuation, brain edema, intubated and at significant risk of neurological worsening, death form hematoma expansion, cerebral edema, brain herniation. This patient's care requires constant monitoring of vital signs, hemodynamics, respiratory and cardiac monitoring, review of multiple databases, neurological assessment, discussion with family, other specialists and medical decision making of high complexity. I spent 40 minutes of neurocritical care time in the care of this patient. I had long discussion with daughter at bedside, updated pt current condition, treatment plan and potential prognosis.     Rosalin Hawking, MD PhD Stroke Neurology 04/06/2018 10:27 AM    To contact Stroke Continuity provider, please refer to [email or web address] After hours, contact General Neurology

## 2018-04-06 NOTE — Procedures (Signed)
Arterial Catheter Insertion Procedure Note Joshawn Crissman [id number] Jul 23, 1961  Procedure: Insertion of Arterial Catheter  Indications: Blood pressure monitoring and Frequent blood sampling  Procedure Details Consent: Risks of procedure as well as the alternatives and risks of each were explained to the (patient/caregiver).  Consent for procedure obtained. Time Out: Verified patient identification, verified procedure, site/side was marked, verified correct patient position, special equipment/implants available, medications/allergies/relevent history reviewed, required imaging and test results available.  Performed  Maximum sterile technique was used including antiseptics, cap, gloves, gown, hand hygiene, mask and sheet. Skin prep: Chlorhexidine; local anesthetic administered 20 gauge catheter was inserted into right radial artery using the Seldinger technique. ULTRASOUND GUIDANCE USED: NO Evaluation Blood flow good; BP tracing good. Complications: No apparent complications.  Arterial line placed with 1 attempt without incident.   Mali M Geanie Pacifico 04/06/2018

## 2018-04-06 NOTE — Progress Notes (Signed)
Patient ID: Hector Pugh, male   DOB: [birth year], 57 y.o.   MRN: [id number] Repeat ct shows less shift, partial clot evacuation. Much better overall.

## 2018-04-06 NOTE — Progress Notes (Signed)
Was asked to check in on patient by his daughter [name]. Reviewed chart. On exam patient intubated and sedated, awakens to painful stimuli, good cough to ETT suction, small amount creamy tan secretions present in ETT on suctioning. Lungs CTA b/l. Abd soft NTND. No LE edema. Foley catheter present. 2-PIV's. Aline present but is very positional with poor waveform. Propofol infusion and 3% Hypertonic saline (@ 50cc/hr) are infusing. Chart shows low grade fever up to 100.8 today, hemodynamically stable. Labs show UA negative for infxn. CBC and BMP unremarkable. Last ABG was 5/3 @ 1433. UOP 25cc/hr per RN.   - place OG tube as will need to start nutrition in AM; do not think he will be ready to safely extubate within the next day.  - obtain new ABG now - will need new Aline at some point soon as current Aline has very poor waveline - change fentanyl IV PRN from 147mcg to 25-37mcg per RN request as she says the 170mcg oversedates him. Offered fentanyl infusion, but she doesn't think he needs it.  - start gentle IVF's given the low UOP - concern for developing aspiration pneumonia, given the fever and purulent ETT secretions, although no visible infiltrates as of yet on my review of today's CXR. Will obtain sputum culture and blood cultures, check lactate and procalcitonin. Start Zosyn. MRSA nares is negative.  - place new CXR order for the AM  45 minutes critical care time  Vernie Murders, MD Pulmonary & Critical Care Medicine Pager: [phone]

## 2018-04-06 NOTE — Progress Notes (Signed)
While this nurse was in the neighboring room, a loud noise was heard from patient's room, 4N25. Upon arrival, a patient's family member was lying on the floor surrounded by Dr. Elvera Bicker, the respiratory therapist, and other family members. The family member was able to stand on his own and ambulate to the chair in the corner of the room with assistance. Rapid response was called and he was given something to drink and eat. When rapid response arrived, he declined going to the emergency department. He left shortly after with family.

## 2018-04-06 NOTE — Progress Notes (Signed)
Pt transported to and from 4N25 to CT 1 on ventilator. Pt stable throughout with no complications. VS within normal limits.

## 2018-04-06 NOTE — Progress Notes (Signed)
RT NOTE:  Sputum culture obtained via ETT per MD order. Sample labeled and sent to Main Lab.

## 2018-04-06 NOTE — Progress Notes (Signed)
SLP Cancellation Note  Patient Details Name: Hector Pugh MRN: [id number] DOB: 03-Jan-1961   Cancelled treatment:       Reason Eval/Treat Not Completed: Patient not medically ready  Deneise Lever, [place], [place] Speech-Language Pathologist [place] 04/06/2018, 8:43 AM

## 2018-04-07 ENCOUNTER — Inpatient Hospital Stay (HOSPITAL_COMMUNITY): Payer: Managed Care, Other (non HMO)

## 2018-04-07 DIAGNOSIS — I351 Nonrheumatic aortic (valve) insufficiency: Secondary | ICD-10-CM

## 2018-04-07 DIAGNOSIS — E1159 Type 2 diabetes mellitus with other circulatory complications: Secondary | ICD-10-CM

## 2018-04-07 LAB — POCT I-STAT 7, (LYTES, BLD GAS, ICA,H+H)
Acid-base deficit: 2 mmol/L (ref 0.0–2.0)
Bicarbonate: 23.6 mmol/L (ref 20.0–28.0)
Calcium, Ion: 1.08 mmol/L — ABNORMAL LOW (ref 1.15–1.40)
HEMATOCRIT: 41 % (ref 39.0–52.0)
HEMOGLOBIN: 13.9 g/dL (ref 13.0–17.0)
O2 SAT: 100 %
PCO2 ART: 41.5 mmHg (ref 32.0–48.0)
POTASSIUM: 4 mmol/L (ref 3.5–5.1)
Sodium: 129 mmol/L — ABNORMAL LOW (ref 135–145)
TCO2: 25 mmol/L (ref 22–32)
pH, Arterial: 7.363 (ref 7.350–7.450)
pO2, Arterial: 347 mmHg — ABNORMAL HIGH (ref 83.0–108.0)

## 2018-04-07 LAB — GLUCOSE, CAPILLARY
GLUCOSE-CAPILLARY: 129 mg/dL — AB (ref 65–99)
GLUCOSE-CAPILLARY: 168 mg/dL — AB (ref 65–99)
GLUCOSE-CAPILLARY: 134 mg/dL — AB (ref 65–99)
Glucose-Capillary: 105 mg/dL — ABNORMAL HIGH (ref 65–99)
Glucose-Capillary: 126 mg/dL — ABNORMAL HIGH (ref 65–99)
Glucose-Capillary: 126 mg/dL — ABNORMAL HIGH (ref 65–99)
Glucose-Capillary: 141 mg/dL — ABNORMAL HIGH (ref 65–99)

## 2018-04-07 LAB — POCT I-STAT 4, (NA,K, GLUC, HGB,HCT)
Glucose, Bld: 212 mg/dL — ABNORMAL HIGH (ref 65–99)
HCT: 43 % (ref 39.0–52.0)
Hemoglobin: 14.6 g/dL (ref 13.0–17.0)
Potassium: 3.9 mmol/L (ref 3.5–5.1)
SODIUM: 131 mmol/L — AB (ref 135–145)

## 2018-04-07 LAB — CBC
HCT: 35.2 % — ABNORMAL LOW (ref 39.0–52.0)
Hemoglobin: 11.2 g/dL — ABNORMAL LOW (ref 13.0–17.0)
MCH: 28.4 pg (ref 26.0–34.0)
MCHC: 31.8 g/dL (ref 30.0–36.0)
MCV: 89.3 fL (ref 78.0–100.0)
Platelets: 113 10*3/uL — ABNORMAL LOW (ref 150–400)
RBC: 3.94 MIL/uL — ABNORMAL LOW (ref 4.22–5.81)
RDW: 13.2 % (ref 11.5–15.5)
WBC: 7.4 10*3/uL (ref 4.0–10.5)

## 2018-04-07 LAB — ECHOCARDIOGRAM COMPLETE
HEIGHTINCHES: 66 in
Weight: 2557.34 oz

## 2018-04-07 LAB — COMPREHENSIVE METABOLIC PANEL
ALT: 12 U/L — ABNORMAL LOW (ref 17–63)
AST: 16 U/L (ref 15–41)
Albumin: 3 g/dL — ABNORMAL LOW (ref 3.5–5.0)
Alkaline Phosphatase: 32 U/L — ABNORMAL LOW (ref 38–126)
Anion gap: 7 (ref 5–15)
BUN: 20 mg/dL (ref 6–20)
CO2: 26 mmol/L (ref 22–32)
Calcium: 7.9 mg/dL — ABNORMAL LOW (ref 8.9–10.3)
Chloride: 113 mmol/L — ABNORMAL HIGH (ref 101–111)
Creatinine, Ser: 0.94 mg/dL (ref 0.61–1.24)
GFR calc Af Amer: >60 mL/min (ref >60)
GFR calc non Af Amer: >60 mL/min (ref >60)
Glucose, Bld: 138 mg/dL — ABNORMAL HIGH (ref 65–99)
Potassium: 3.7 mmol/L (ref 3.5–5.1)
Sodium: 146 mmol/L — ABNORMAL HIGH (ref 135–145)
Total Bilirubin: 0.8 mg/dL (ref 0.3–1.2)
Total Protein: 5.5 g/dL — ABNORMAL LOW (ref 6.5–8.1)

## 2018-04-07 LAB — PROCALCITONIN: Procalcitonin: <0.1 ng/mL

## 2018-04-07 LAB — TSH: TSH: 1.216 u[IU]/mL (ref 0.350–4.500)

## 2018-04-07 LAB — BASIC METABOLIC PANEL
Anion gap: 5 (ref 5–15)
BUN: 16 mg/dL (ref 6–20)
CALCIUM: 7.4 mg/dL — AB (ref 8.9–10.3)
CO2: 24 mmol/L (ref 22–32)
CREATININE: 0.76 mg/dL (ref 0.61–1.24)
Chloride: 118 mmol/L — ABNORMAL HIGH (ref 101–111)
GFR calc Af Amer: >60 mL/min (ref >60)
GLUCOSE: 133 mg/dL — AB (ref 65–99)
Potassium: 3.2 mmol/L — ABNORMAL LOW (ref 3.5–5.1)
Sodium: 147 mmol/L — ABNORMAL HIGH (ref 135–145)

## 2018-04-07 LAB — SODIUM
SODIUM: 146 mmol/L — AB (ref 135–145)
SODIUM: 148 mmol/L — AB (ref 135–145)
Sodium: 146 mmol/L — ABNORMAL HIGH (ref 135–145)

## 2018-04-07 LAB — PROTIME-INR
INR: 1.4
Prothrombin Time: 17.1 seconds — ABNORMAL HIGH (ref 11.4–15.2)

## 2018-04-07 LAB — PHOSPHORUS: Phosphorus: 1.9 mg/dL — ABNORMAL LOW (ref 2.5–4.6)

## 2018-04-07 LAB — MAGNESIUM
Magnesium: 2 mg/dL (ref 1.7–2.4)
Magnesium: 2 mg/dL (ref 1.7–2.4)

## 2018-04-07 LAB — TROPONIN I: Troponin I: <0.03 ng/mL (ref <0.03)

## 2018-04-07 LAB — APTT: aPTT: 30 seconds (ref 24–36)

## 2018-04-07 MED ORDER — AMIODARONE LOAD VIA INFUSION
150.0000 mg | Freq: Once | INTRAVENOUS | Status: AC
  Administered 2018-04-07: 150 mg via INTRAVENOUS
  Filled 2018-04-07: qty 83.34

## 2018-04-07 MED ORDER — TAMSULOSIN HCL 0.4 MG PO CAPS
0.4000 mg | ORAL_CAPSULE | Freq: Every day | ORAL | Status: DC
  Administered 2018-04-07 – 2018-04-15 (×9): 0.4 mg via ORAL
  Filled 2018-04-07 (×9): qty 1

## 2018-04-07 MED ORDER — SODIUM CHLORIDE 0.9 % IV SOLN
20.0000 mmol | Freq: Once | INTRAVENOUS | Status: AC
  Administered 2018-04-07: 20 mmol via INTRAVENOUS
  Filled 2018-04-07: qty 6.67

## 2018-04-07 MED ORDER — SODIUM CHLORIDE 0.9% FLUSH: 10.0000 mL | INTRAVENOUS | Status: DC | PRN

## 2018-04-07 MED ORDER — CHLORHEXIDINE GLUCONATE CLOTH 2 % EX PADS
6.0000 | MEDICATED_PAD | Freq: Every day | CUTANEOUS | Status: DC
  Administered 2018-04-08 – 2018-04-14 (×7): 6 via TOPICAL

## 2018-04-07 MED ORDER — AMIODARONE HCL IN DEXTROSE 360-4.14 MG/200ML-% IV SOLN
60.0000 mg/h | INTRAVENOUS | Status: AC
  Administered 2018-04-07 – 2018-04-08 (×2): 60 mg/h via INTRAVENOUS
  Filled 2018-04-07: qty 200

## 2018-04-07 MED ORDER — ACETAMINOPHEN 10 MG/ML IV SOLN
1000.0000 mg | Freq: Four times a day (QID) | INTRAVENOUS | Status: AC
  Administered 2018-04-07 – 2018-04-08 (×4): 1000 mg via INTRAVENOUS
  Filled 2018-04-07 (×4): qty 100

## 2018-04-07 MED ORDER — DEXMEDETOMIDINE HCL IN NACL 200 MCG/50ML IV SOLN
0.4000 ug/kg/h | INTRAVENOUS | Status: DC
  Administered 2018-04-07: 0.4 ug/kg/h via INTRAVENOUS
  Filled 2018-04-07: qty 50

## 2018-04-07 MED ORDER — PNEUMOCOCCAL VAC POLYVALENT 25 MCG/0.5ML IJ INJ: 0.5000 mL | INJECTION | INTRAMUSCULAR | Status: DC

## 2018-04-07 MED ORDER — METOPROLOL TARTRATE 5 MG/5ML IV SOLN
5.0000 mg | Freq: Four times a day (QID) | INTRAVENOUS | Status: DC | PRN
  Administered 2018-04-07 – 2018-04-12 (×5): 5 mg via INTRAVENOUS
  Filled 2018-04-07 (×5): qty 5

## 2018-04-07 MED ORDER — METOPROLOL TARTRATE 5 MG/5ML IV SOLN
5.0000 mg | INTRAVENOUS | Status: AC | PRN
  Administered 2018-04-07: 5 mg via INTRAVENOUS
  Filled 2018-04-07: qty 5

## 2018-04-07 MED ORDER — PIPERACILLIN-TAZOBACTAM 3.375 G IVPB
3.3750 g | Freq: Three times a day (TID) | INTRAVENOUS | Status: DC
  Administered 2018-04-07 – 2018-04-09 (×5): 3.375 g via INTRAVENOUS
  Filled 2018-04-07 (×6): qty 50

## 2018-04-07 MED ORDER — AMIODARONE HCL IN DEXTROSE 360-4.14 MG/200ML-% IV SOLN
30.0000 mg/h | INTRAVENOUS | Status: DC
  Administered 2018-04-08 (×3): 30 mg/h via INTRAVENOUS
  Filled 2018-04-07 (×2): qty 200

## 2018-04-07 MED ORDER — AMIODARONE HCL IN DEXTROSE 360-4.14 MG/200ML-% IV SOLN
INTRAVENOUS | Status: AC
  Filled 2018-04-07: qty 200

## 2018-04-07 MED ORDER — LABETALOL HCL 5 MG/ML IV SOLN
10.0000 mg | Freq: Once | INTRAVENOUS | Status: AC
  Administered 2018-04-07: 10 mg via INTRAVENOUS

## 2018-04-07 MED ORDER — SODIUM CHLORIDE 0.9% FLUSH
10.0000 mL | Freq: Two times a day (BID) | INTRAVENOUS | Status: DC
  Administered 2018-04-07: 20 mL
  Administered 2018-04-08: 30 mL
  Administered 2018-04-09: 10 mL
  Administered 2018-04-09 – 2018-04-10 (×2): 20 mL
  Administered 2018-04-10: 10 mL
  Administered 2018-04-11: 20 mL
  Administered 2018-04-11 – 2018-04-12 (×3): 10 mL
  Administered 2018-04-13: 20 mL
  Administered 2018-04-13: 10 mL
  Administered 2018-04-14: 20 mL
  Administered 2018-04-15: 10 mL

## 2018-04-07 MED ORDER — SODIUM CHLORIDE 0.9 % IV SOLN
INTRAVENOUS | Status: DC
  Administered 2018-04-07: 02:00:00 via INTRAVENOUS

## 2018-04-07 MED ORDER — POTASSIUM PHOSPHATES 15 MMOLE/5ML IV SOLN
20.0000 mmol | Freq: Once | INTRAVENOUS | Status: DC
  Filled 2018-04-07: qty 6.67

## 2018-04-07 MED ORDER — LOSARTAN POTASSIUM 50 MG PO TABS
50.0000 mg | ORAL_TABLET | Freq: Every day | ORAL | Status: DC
  Administered 2018-04-07 – 2018-04-08 (×2): 50 mg via ORAL
  Filled 2018-04-07 (×2): qty 1

## 2018-04-07 NOTE — Progress Notes (Addendum)
Peripherally Inserted Central Catheter/Midline Placement  The IV Nurse has discussed with the patient and/or persons authorized to consent for the patient, the purpose of this procedure and the potential benefits and risks involved with this procedure.  The benefits include less needle sticks, lab draws from the catheter, and the patient may be discharged home with the catheter. Risks include, but not limited to, infection, bleeding, blood clot (thrombus formation), and puncture of an artery; nerve damage and irregular heartbeat and possibility to perform a PICC exchange if needed/ordered by physician.  Alternatives to this procedure were also discussed.  Bard Power PICC patient education guide, fact sheet on infection prevention and patient information card has been provided to patient /or left at bedside.  Procedure explained to wife and daughter. Both expressed understanding and agreement. Daughter signed consent. Patient is vented and sedated.   PICC/Midline Placement Documentation  PICC Double Lumen 86/57/84 PICC Left Basilic 44 cm 0 cm (Active)  Indication for Insertion or Continuance of Line Poor Vasculature-patient has had multiple peripheral attempts or PIVs lasting less than 24 hours 04/07/2018 10:00 AM  Exposed Catheter (cm) 0 cm 04/07/2018 10:00 AM  Site Assessment Clean;Dry;Intact 04/07/2018 10:00 AM  Lumen #1 Status Flushed;No blood return;Saline locked 04/07/2018 10:00 AM  Lumen #2 Status Flushed;Blood return noted;Saline locked 04/07/2018 10:00 AM  Dressing Type Transparent 04/07/2018 10:00 AM  Dressing Status Clean;Dry;Intact;Antimicrobial disc in place 04/07/2018 10:00 AM  Dressing Change Due 04/14/18 04/07/2018 10:00 AM       Alanson Puls, Keenan Bachelor 04/07/2018, 10:07 AM

## 2018-04-07 NOTE — Progress Notes (Signed)
RT transported pt on vent to CT on full vent support. Vitals remained stable throughout.  Pt placed back on wean 5/5 upon return to 4N25.  Pt tolerating wean well at this time. RN at bedside.

## 2018-04-07 NOTE — Progress Notes (Signed)
PULMONARY / CRITICAL CARE MEDICINE   Name: Hector Pugh MRN: [id number] DOB: 1961-05-09    ADMISSION DATE:  04/05/2018   CHIEF COMPLAINT:  ICH  HISTORY OF PRESENT ILLNESS:   57 y/o M who presented to [facility] on 5/3 with reports of headache, vomiting and altered mental status.  The patient was in his usual state of health until 5/3 am.  He carries a history of pancreatic cancer s/p whipple (2013 / considered cure) with subsequent development of DM.  Prior to admit, he was functional / working full time.  He had a screening colonoscopy on 5/2 which was reportedly clean.  Family reports that the patient complained of headache on the am of 5/2 which they thought was associated with a lack of caffeine intake while NPO for procedure.  During the procedure, he reportedly received 8 mg versed and 50 mcg of fentanyl.  After the procedure, he was sleepy but appropriate.  The patient woke around 0500 and was appropriate.  His wife went to work as usual.  At some point between 0500 and 0800, he went to the kitchen to make tea and went back up stairs.  His wife called to check on him around 0800 and he did not answer the phone.  She called her daughter, who is an ICU nurse, to check on the patient.  On arrival, the patient was reportedly aphasic and had emesis on his shirt and in the bed.  EMS was activated.  He vomited again with EMS transport.  He presented with 4/5 strength per Neurology but did not make it to the OR with 4/5.  Work up in [place] revealed a large temporal hemorrhage.  The patient was taken emergently to the OR for evacuation.  He was returned to ICU on vent postoperatively.   5/4: Patient was seen and examined bedside. Superior reviewed with neurology. Discussed the case with neurology and with family at bedside. Will attempt to extubate   PAST MEDICAL HISTORY :  He  has a past medical history of Biliary stricture (11/11/2012), Cancer ([place]), Diabetes mellitus ([place]) (11/10/2012), Diabetes mellitus without  complication ([place]), HTN (hypertension) (11/10/2012), Hypertension, Obstructive jaundice (11/11/2012), Pancreatic cancer ([place]), Pancreatitis (11/10/2012), and Seasonal allergies.  PAST SURGICAL HISTORY: He  has a past surgical history that includes circucision; Hernia repair; Left Foot Surgery; Vasectomy; circumsision; Left Foot Surgery; EUS (11/08/2012); Fine needle aspiration (11/08/2012); ERCP (11/08/2012); laparoscopy (12/12/2012); Whipple procedure (12/12/2012); and Colonoscopy (N/A, 04/04/2018).  No Known Allergies  No current facility-administered medications on file prior to encounter.    Current Outpatient Medications on File Prior to Encounter  Medication Sig  . acetaminophen (TYLENOL) 500 MG tablet Take 500 mg by mouth every 4 (four) hours as needed for moderate pain or headache.   Marland Kitchen glipiZIDE (GLUCOTROL XL) 5 MG 24 hr tablet Take 5 mg by mouth daily with breakfast.   . levothyroxine (SYNTHROID, LEVOTHROID) 50 MCG tablet Take 50 mcg by mouth daily before breakfast.  . losartan (COZAAR) 50 MG tablet Take 50 mg by mouth daily.  . tamsulosin (FLOMAX) 0.4 MG CAPS capsule Take 0.4 mg by mouth daily.    FAMILY HISTORY:  His indicated that his mother is deceased. He indicated that his father is deceased.   SOCIAL HISTORY: He  reports that he has never smoked. He has never used smokeless tobacco. He reports that he does not drink alcohol or use drugs.  REVIEW OF SYSTEMS:   unattainable   SUBJECTIVE:  Elderly male appropriate for age lying  in bed comfortably on propofol  VITAL SIGNS: BP 126/73   Pulse 79   Temp 99.5 F (37.5 C) (Axillary)   Resp 16   Ht 5\' 6"  (1.676 m)   Wt 159 lb 13.3 oz (72.5 kg)   SpO2 100%   BMI 25.80 kg/m   HEMODYNAMICS:    VENTILATOR SETTINGS: Vent Mode: PRVC FiO2 (%):  [30 %] 30 % Set Rate:  [15 bmp] 15 bmp Vt Set:  [510 mL] 510 mL PEEP:  [5 cmH20] 5 cmH20 Plateau Pressure:  [13 cmH20-15 cmH20] 13 cmH20  INTAKE / OUTPUT: I/O last 3 completed  shifts: In: 3773.7 [I.V.:2783.6; NG/GT:40; IV Piggyback:950.1] Out: 5176 [Urine:1655]  PHYSICAL EXAMINATION: General: Elderly male patient lying in bed comfortably on propofol. Neuro: Neuro exam limited while on 20 mg of propofol . opens eyes spontaneously.  Moves head spontaneously.  Moves bilateral lower extremities spontaneously.  5 out of 5 muscle strength in the bilateral lower extremities.  Moves left hand spontaneously more frequently than right upper extremity . pupils are equal round reactive to light.  Patient withdraws all 4 extremities to pain Cardiovascular: S1-S2 regular rate and rhythm Lungs: On mechanical ventilator.  Good air entry bilaterally.  No wheezes rales or rhonchi. Abdomen: Abdomen soft nontender positive bowel sounds in all quadrants Musculoskeletal: Positive pulses in all 4 extremities.  No lower extremity edema noted. Skin: Intact  LABS:  BMET Recent Labs  Lab 04/05/18 0925 04/05/18 0935 04/06/18 0312  04/06/18 1624 04/06/18 2254 04/07/18 0454  NA 133* 132* 137   < > 141 143 146*  K 4.0 3.9 3.9  --   --   --  3.7  CL 99* 99* 105  --   --   --  113*  CO2 21*  --  24  --   --   --  26  BUN 17 21* 17  --   --   --  20  CREATININE 0.94 0.70 0.94  --   --   --  0.94  GLUCOSE 217* 216* 128*  --   --   --  138*   < > = values in this interval not displayed.    Electrolytes Recent Labs  Lab 04/05/18 0925 04/06/18 0312 04/07/18 0454  CALCIUM 9.0 7.6* 7.9*  MG  --   --  2.0  PHOS  --   --  1.9*    CBC Recent Labs  Lab 04/05/18 0925  04/05/18 1741 04/06/18 0312 04/07/18 0454  WBC 14.2*  --   --  11.1* 7.4  HGB 15.2   < > 13.1 11.3* 11.2*  HCT 45.3   < > 38.6* 34.6* 35.2*  PLT 159  --   --  159 113*   < > = values in this interval not displayed.    Coag's Recent Labs  Lab 04/05/18 0925 04/07/18 0454  APTT 26 30  INR 1.18 1.40    Sepsis Markers Recent Labs  Lab 04/06/18 2253 04/06/18 2254  LATICACIDVEN 1.1  --   PROCALCITON  --   <0.10    ABG Recent Labs  Lab 04/05/18 1433 04/06/18 2205  PHART 7.373 7.425  PCO2ART 40.5 37.8  PO2ART 363.0* 141*    Liver Enzymes Recent Labs  Lab 04/05/18 0925 04/07/18 0454  AST 23 16  ALT 20 12*  ALKPHOS 44 32*  BILITOT 1.4* 0.8  ALBUMIN 4.0 3.0*    Cardiac Enzymes No results for input(s): TROPONINI, PROBNP in the last 168 hours.  Glucose Recent Labs  Lab 04/06/18 0718 04/06/18 1130 04/06/18 1601 04/06/18 2018 04/07/18 0016 04/07/18 0343  GLUCAP 121* 82 113* 97 105* 126*    Imaging Ct Head Wo Contrast  Result Date: 04/06/2018 CLINICAL DATA:  Craniotomy for evacuation of left temporal lobe hematoma 04/05/2018 EXAM: CT HEAD WITHOUT CONTRAST TECHNIQUE: Contiguous axial images were obtained from the base of the skull through the vertex without intravenous contrast. COMPARISON:  Pre and postoperative head CTs 04/05/2018. FINDINGS: Brain: Residual blood products in the left temporal lobe are similar to the postoperative exam. Intraventricular hemorrhage is again noted, left greater than right. There is no hydrocephalus. Midline shift just above the foramen of Monro measures 6 mm, unchanged. No new hemorrhage is present. Edema surrounding the left temporal hemorrhage is stable. Minimal extra-axial blood products are stable. Brainstem and cerebellum are within normal limits. Vascular: Atherosclerotic calcifications are present at the dural margin of both vertebral arteries and within the cavernous internal carotid arteries without a hyperdense vessel. Skull: Left temporoparietal craniotomy is again noted. Calvarium is otherwise intact. No focal lytic or blastic lesions are present. Sinuses/Orbits: The paranasal sinuses and mastoid air cells are clear. Globes and orbits are within normal limits. IMPRESSION: 1. Similar postoperative appearance of residual blood products in the left temporal lobe following partial evacuation of the hematoma. 2. Stable intraventricular hemorrhage  without hydrocephalus. 3. 6 mm left-to-right midline shift is stable. Electronically Signed   By: San Morelle M.D.   On: 04/06/2018 09:13   Dg Abd Portable 1v  Result Date: 04/06/2018 CLINICAL DATA:  OG tube placement EXAM: PORTABLE ABDOMEN - 1 VIEW COMPARISON:  06/15/2016 FINDINGS: Esophageal tube tip is in the left upper quadrant. Surgical changes in the right hemiabdomen. Visible gas pattern nonobstructed IMPRESSION: Esophageal tube tip is in the left upper quadrant of the abdomen Electronically Signed   By: Donavan Foil M.D.   On: 04/06/2018 22:58   [place] Ekg Site Rite  Result Date: 04/06/2018 If Site Rite image not attached, placement could not be confirmed due to current cardiac rhythm.    STUDIES:   CTA Head 5/3 >> stable left temporal lobe hemorrhage with left to right midline shift. Lobar hypertensive bleed is favored. No underlying vascular anomaly, or arterial / venous thrombosis/embolus.   [place] 04/06/18:  Residual blood products in the left temporal lobe are similar to the postoperative exam. Intraventricular hemorrhage is again noted, left greater than right. There is no hydrocephalus. Midline shift just above the foramen of Monro measures 6 mm, unchanged. No new hemorrhage is present. Edema surrounding the left temporal hemorrhage is stable.    SIGNIFICANT EVENTS: 5/03  Admit with AMS, headache, vomiting.  L ICH / hypertensive bleed  5/4: Attempted to extubate.  Patient has periods of apnea most likely secondary to fentanyl    LINES/TUBES: ETT 5/3 >>    DISCUSSION: 57 y/o M admitted with large left ICH status post evacuation on 04/05/2018.    ASSESSMENT / PLAN:  PULMONARY A: Ventilator dependent respiratory failure secondary to intracranial hemorrhage  P:   Vap precautions PRVC 8 cc/kg Patient saturating well on minimal vent settings Attempt wean and extubation today.  Patient had periods of apnea yesterday most likely secondary to fentanyl.  Patient has  history of slow clearance of opioids as per family.  Patient has not had fentanyl overnight.  He is currently only on propofol.  Patient becomes agitated we will start Precedex and try to extubate with Precedex Cream-colored sputum overnight.  No  fevers.  Procalcitonin negative lactate normal.  Chest x-ray ordered last night reviewed personally by me and it is clear.  No signs of aspiration pneumonia.  Patient was started on Zosyn overnight.  CARDIOVASCULAR A:  Hypertension P:  Goal systolic blood pressure [phone] Cardene drip as needed Map on blood pressure cuff is accurate.  Will assess blood pressure using cuff  RENAL A:   No acute issues P:   Trend ins and outs Replace electrolytes as needed Every 6 hour sodium while patient is on hypertonic saline  GASTROINTESTINAL A:   History of pancreatic cancer status post Whipple 2013 P:   N.p.o. OGT  If unable to extubate the patient today we will start patient on tube feeds.  HEMATOLOGIC A:   Leukocytosis secondary to reactive Thrombocytopenia secondary to consumption most likely P:  Trend daily CBC SCDs for DVT prophylaxis  INFECTIOUS A:   No acute issues at this time P:   We will continue to trend WBC. Started on Zosyn overnight for possible aspiration pneumonia.  Very low evidence of aspiration pneumonia at this time most likely chemical pneumonitis.  Will monitor patient on antibiotics for the next 24 to 48 hours.  If patient improves will DC antibiotics. Trend fever curve  ENDOCRINE A:   Diabetes secondary to Whipple P:   Insulin sliding scale   NEUROLOGIC A:   Large left intracranial hemorrhage P:   RASS goal 0 to -1 CT head from 04/06/2018 reviewed.  Similar to previous CT head done.  No enlargement of bleed.  Mild to moderate edema Patient started on hypertonic saline yesterday.  Will repeat CT head today for further evaluation of edema and midline shift. Keppra 500 mg every 12 for seizure prophylaxis PT OT  speech therapy when able   FAMILY  - Updates: Family is at bedside.  Discussed the case extensively with family.  We will try to extubate the patient today.  Patient family very supportive.  Patient's daughter works here at [employer] and the charge nurse overnight.  Daughter and family are very engaged in his medical care.    Rojelio Brenner Pulmonary Critical Care Pager: [phone]  04/07/2018, 7:37 AM

## 2018-04-07 NOTE — Progress Notes (Signed)
Patient ID: Keyonte Cookston, male   DOB: Oct 15, 1961, 57 y.o.   MRN: [id number] BP 112/72   Pulse 80   Temp 99.5 F (37.5 C) (Axillary)   Resp 18   Ht 5\' 6"  (1.676 m)   Wt 72.5 kg (159 lb 13.3 oz)   SpO2 100%   BMI 25.80 kg/m  Sedated, intubated Awaiting new CT today Perrl, +cough, +gag Stable exam. Still weaning Wound is clean, and dry

## 2018-04-07 NOTE — Progress Notes (Signed)
  Echocardiogram 2D Echocardiogram has been performed.  Darlina Sicilian M 04/07/2018, 10:42 AM

## 2018-04-07 NOTE — Progress Notes (Signed)
STROKE TEAM PROGRESS NOTE   SUBJECTIVE (INTERVAL HISTORY) His daughter is at the bedside.  Overall his condition is stable. Still intubated, just had PICC line procedure for IV access. Pending TTE and CT repeat. Plan to extubate today. On weaning. Slightly drowsy but able to arouse, still on propofol. Left sided hemiparesis not changed.    OBJECTIVE Temp:  [99.4 F (37.4 C)-100.8 F (38.2 C)] 99.5 F (37.5 C) (05/05 0400) Pulse Rate:  [64-88] 84 (05/05 0948) Cardiac Rhythm: Normal sinus rhythm (05/05 0800) Resp:  [0-34] 18 (05/05 0930) BP: (105-154)/(65-83) 133/80 (05/05 0948) SpO2:  [100 %] 100 % (05/05 0948) Arterial Line BP: (101-137)/(70-104) 101/93 (05/05 0430) FiO2 (%):  [30 %] 30 % (05/05 0948)  Recent Labs  Lab 04/06/18 1601 04/06/18 2018 04/07/18 0016 04/07/18 0343 04/07/18 0812  GLUCAP 113* 97 105* 126* 129*   Recent Labs  Lab 04/05/18 0925 04/05/18 0935 04/06/18 0312 04/06/18 1036 04/06/18 1624 04/06/18 2254 04/07/18 0454  NA 133* 132* 137 137 141 143 146*  K 4.0 3.9 3.9  --   --   --  3.7  CL 99* 99* 105  --   --   --  113*  CO2 21*  --  24  --   --   --  26  GLUCOSE 217* 216* 128*  --   --   --  138*  BUN 17 21* 17  --   --   --  20  CREATININE 0.94 0.70 0.94  --   --   --  0.94  CALCIUM 9.0  --  7.6*  --   --   --  7.9*  MG  --   --   --   --   --   --  2.0  PHOS  --   --   --   --   --   --  1.9*   Recent Labs  Lab 04/05/18 0925 04/07/18 0454  AST 23 16  ALT 20 12*  ALKPHOS 44 32*  BILITOT 1.4* 0.8  PROT 6.7 5.5*  ALBUMIN 4.0 3.0*   Recent Labs  Lab 04/05/18 0925 04/05/18 0935 04/05/18 1741 04/06/18 0312 04/07/18 0454  WBC 14.2*  --   --  11.1* 7.4  NEUTROABS 12.0*  --   --   --   --   HGB 15.2 16.0 13.1 11.3* 11.2*  HCT 45.3 47.0 38.6* 34.6* 35.2*  MCV 84.7  --   --  85.9 89.3  PLT 159  --   --  159 113*   No results for input(s): CKTOTAL, CKMB, CKMBINDEX, TROPONINI in the last 168 hours. Recent Labs    04/05/18 0925  04/07/18 0454  LABPROT 14.9 17.1*  INR 1.18 1.40   Recent Labs    04/06/18 0930  COLORURINE YELLOW  LABSPEC 1.028  PHURINE 5.0  GLUCOSEU NEGATIVE  HGBUR NEGATIVE  BILIRUBINUR NEGATIVE  KETONESUR 5*  PROTEINUR NEGATIVE  NITRITE NEGATIVE  LEUKOCYTESUR TRACE*       Component Value Date/Time   CHOL 99 04/06/2018 0325   TRIG 98 04/06/2018 0325   HDL 34 (L) 04/06/2018 0325   CHOLHDL 2.9 04/06/2018 0325   VLDL 20 04/06/2018 0325   LDLCALC 45 04/06/2018 0325   Lab Results  Component Value Date   HGBA1C 6.8 (H) 04/06/2018      Component Value Date/Time   LABOPIA NONE DETECTED 04/06/2018 0504   COCAINSCRNUR NONE DETECTED 04/06/2018 0504   LABBENZ POSITIVE (A) 04/06/2018 0504  AMPHETMU NONE DETECTED 04/06/2018 0504   THCU NONE DETECTED 04/06/2018 0504   LABBARB NONE DETECTED 04/06/2018 0504    Recent Labs  Lab 04/05/18 0925  ETH <10    I have personally reviewed the radiological images below and agree with the radiology interpretations.  Ct Angio Head W Or Wo Contrast  Result Date: 04/05/2018 CLINICAL DATA:  Acute LEFT temporal lobe hemorrhage. Patient has history of hypertension. EXAM: CT ANGIOGRAPHY HEAD TECHNIQUE: Multidetector CT imaging of the head was performed using the standard protocol during bolus administration of intravenous contrast. Multiplanar CT image reconstructions and MIPs were obtained to evaluate the vascular anatomy. CONTRAST:  7mL ISOVUE-370 IOPAMIDOL (ISOVUE-370) INJECTION 76% COMPARISON:  Code stroke CT earlier today. FINDINGS: CTA HEAD Anterior circulation: Large LEFT temporal lobe hemorrhage, with intraventricular extension, appears to be stable from prior code stroke CT. Cross-section of 37 x 59 x 33 mm, volume ~36 mL, of the hematoma proper, not including the intraventricular component. LEFT-to-RIGHT shift, 8 mm, including uncal herniation. No visible proximal vascular occlusion, embolus, arteriovenous malformation, saccular aneurysm, RCVS,  vasculitis, active extravasation, or venous thrombosis. Posterior circulation: No significant stenosis, proximal occlusion, aneurysm, or vascular malformation. Venous sinuses: As permitted by contrast timing, patent. Anatomic variants: None. Delayed phase: Not performed. IMPRESSION: Stable LEFT temporal lobe hemorrhage, with LEFT-to-RIGHT midline shift. Lobar hypertensive bleed is favored. No underlying vascular anomaly, or arterial/venous thrombosis/embolus. Findings discussed with ordering provider Cabbell MD at approximately 10:40 a.m. Electronically Signed   By: Staci Righter M.D.   On: 04/05/2018 11:12   Ct Head Wo Contrast  Result Date: 04/06/2018 CLINICAL DATA:  Craniotomy for evacuation of left temporal lobe hematoma 04/05/2018 EXAM: CT HEAD WITHOUT CONTRAST TECHNIQUE: Contiguous axial images were obtained from the base of the skull through the vertex without intravenous contrast. COMPARISON:  Pre and postoperative head CTs 04/05/2018. FINDINGS: Brain: Residual blood products in the left temporal lobe are similar to the postoperative exam. Intraventricular hemorrhage is again noted, left greater than right. There is no hydrocephalus. Midline shift just above the foramen of Monro measures 6 mm, unchanged. No new hemorrhage is present. Edema surrounding the left temporal hemorrhage is stable. Minimal extra-axial blood products are stable. Brainstem and cerebellum are within normal limits. Vascular: Atherosclerotic calcifications are present at the dural margin of both vertebral arteries and within the cavernous internal carotid arteries without a hyperdense vessel. Skull: Left temporoparietal craniotomy is again noted. Calvarium is otherwise intact. No focal lytic or blastic lesions are present. Sinuses/Orbits: The paranasal sinuses and mastoid air cells are clear. Globes and orbits are within normal limits. IMPRESSION: 1. Similar postoperative appearance of residual blood products in the left temporal lobe  following partial evacuation of the hematoma. 2. Stable intraventricular hemorrhage without hydrocephalus. 3. 6 mm left-to-right midline shift is stable. Electronically Signed   By: San Morelle M.D.   On: 04/06/2018 09:13   Ct Head Wo Contrast  Result Date: 04/05/2018 CLINICAL DATA:  57 y/o  M; intracranial hemorrhage for follow-up. EXAM: CT HEAD WITHOUT CONTRAST TECHNIQUE: Contiguous axial images were obtained from the base of the skull through the vertex without intravenous contrast. COMPARISON:  03/06/2018 CT head and CTA head. FINDINGS: Brain: Interval partial evacuation of hematoma within the left temporal lobe residual hemorrhage measures 4.4 x 2.8 x 3.1 cm (volume = 20 cm^3). There are small foci of air within the evacuation cavity and extra-axial space from the evacuation. Surrounding edema and mass effect is decreased with improved patency of the lateral ventricles  and decreased midline shift with 6 mm residual left-to-right midline shift. There is a small volume of subarachnoid hemorrhage predominantly over the left cerebral convexity and stable volume of intraventricular hemorrhage within the lateral ventricles. No new acute brain parenchymal hemorrhage, stroke, or focus of mass effect is identified. Vascular: No hyperdense vessel or unexpected calcification. Skull: Postsurgical changes related to a left lateral craniotomy with edema in the overlying scalp. Sinuses/Orbits: No acute finding. Other: None. IMPRESSION: 1. Postsurgical changes from interval partial evacuation of hematoma within the left temporal lobe with approximately 20 cc residual. 2. Stable small volume of intraventricular hemorrhage and small volume of subarachnoid hemorrhage predominantly over left convexity. 3. Decreased mass effect with 6 mm left-to-right midline shift, previously 13 mm. Improved patency of lateral ventricles. 4. No new acute intracranial abnormality identified. Electronically Signed   By: Kristine Garbe M.D.   On: 04/05/2018 22:28   Ct Head Code Stroke Wo Contrast  Result Date: 04/05/2018 CLINICAL DATA:  Code stroke. Acute focal neuro deficit. Right facial droop. Left facial weakness. Slurred speech. EXAM: CT HEAD WITHOUT CONTRAST TECHNIQUE: Contiguous axial images were obtained from the base of the skull through the vertex without intravenous contrast. COMPARISON:  CT head 06/11/2014 FINDINGS: Brain: Large hemorrhage in the left temporal lobe measuring 6.6 by 3.8 cm. Surrounding hypodense edema with moderate mass-effect. Intraventricular extension is present with early hydrocephalus. 13 mm midline shift to the right. No subdural or subarachnoid hemorrhage. No other areas of hemorrhage mass or acute infarction identified. Vascular: Negative for hyperdense vessel Skull: Negative Sinuses/Orbits: Negative Other: None ASPECTS ([place] Stroke Program Early CT Score) - Ganglionic level infarction (caudate, lentiform nuclei, internal capsule, insula, M1-M3 cortex): 7 - Supraganglionic infarction (M4-M6 cortex): 3 Total score (0-10 with 10 being normal): 10 IMPRESSION: 1. Large hemorrhage left temporal lobe with surrounding edema and mass-effect. Intraventricular hemorrhage with mild hydrocephalus. 13 mm midline shift. Etiologies of hemorrhage are not determined on this study but could be due to hypertension, cerebral amyloid, vascular malformation, or less likely tumor. 2. ASPECTS is 10 3. These results were called by telephone at the time of interpretation on 04/05/2018 at 9:42 am to Dr. Leonel Ramsay, who verbally acknowledged these results. Electronically Signed   By: Franchot Gallo M.D.   On: 04/05/2018 09:44   TTE pending  CT repeat pending   PHYSICAL EXAM  Temp:  [99.4 F (37.4 C)-100.8 F (38.2 C)] 99.5 F (37.5 C) (05/05 0400) Pulse Rate:  [64-88] 84 (05/05 0948) Resp:  [0-34] 18 (05/05 0930) BP: (105-154)/(65-83) 133/80 (05/05 0948) SpO2:  [100 %] 100 % (05/05 0948) Arterial Line  BP: (101-137)/(70-104) 101/93 (05/05 0430) FiO2 (%):  [30 %] 30 % (05/05 0948)  General - Well nourished, well developed, in no apparent distress, intubated on sedation.  Ophthalmologic - fundi not visualized due to noncooperation.  Cardiovascular - Regular rate and rhythm.  Neuro - sleepy drowsy, but able to eyes opens with voice, intubated on sedation, not following commands. PERRL, left gaze preference, did not cross midline, left gaze incomplete. Left mild ptosis, not tracking, blinking to visual threat on the left, but not on the right. LUE spontaneous movement, but not cooperative on strength testing, drift to bed. LLE 3/5 on pain stimulation. RLE 2/5 on pain and RUE mild withdraw. DTR 1+ and no babinski. Sensation, coordination and gait not tested.   ASSESSMENT/PLAN [name] is a 57 y.o. male with history of pancreatic endocrine tumor s/p wipple procedure in 2012, DM, HTN admitted  for slurry speech, N/V and right sided weakness. No tPA given due to [place].    ICH - left temporal ICH s/p hematoma evacuation, etiology unclear  Resultant right hemiparesis, ? Right hemianopia, ? aphasia  CT head left temporal large ICH, with severe midline shift  CTA no AVM or aneurysm  Repeat CT 04/06/18 - stable hematoma s/p evacuation, midline much improved  Repeat CT 04/07/18 pending  2D Echo  pending  LDL 45  HgbA1c 6.8  SCDs for VTE prophylaxis  NPO  No antithrombotic prior to admission, now on No antithrombotic.   Continue keppra for seizure prevention  Ongoing aggressive stroke risk factor management  Therapy recommendations:  Pending   Disposition:  Pending   Cerebral edema  CT 04/05/18 left temporal large ICH with severe midline shift  S/p hematoma evacation  CT repeat 04/06/18 midline shift 5.37mm, much improved  on 3% saline  Na goal 150-155  Na Q6h monitoring 143->146  Diabetes  HgbA1c 6.8 goal < 7.0  Controlled  CBG  monitoring  SSI  Hypertension BP stable   BP goal < 140  On cardene and propofol  Resume home losartan   Other Stroke Risk Factors    Other Active Problems  Pancreatic endocrine tumor s/p wipple procedure in 2012  Hospital day # 2  This patient is critically ill due to [place] s/p hematoma evacuation, brain edema, intubated and at significant risk of neurological worsening, death form hematoma expansion, cerebral edema, brain herniation. This patient's care requires constant monitoring of vital signs, hemodynamics, respiratory and cardiac monitoring, review of multiple databases, neurological assessment, discussion with family, other specialists and medical decision making of high complexity. I spent 35 minutes of neurocritical care time in the care of this patient. I had long discussion with daughter at bedside, updated pt current condition, treatment plan and potential prognosis.     Rosalin Hawking, MD PhD Stroke Neurology 04/07/2018 11:02 AM    To contact Stroke Continuity provider, please refer to [email or web address] After hours, contact General Neurology

## 2018-04-07 NOTE — Progress Notes (Signed)
Ordered Zosyn overnight to cover aspiration pneumonia. See that the order was d/c by an RN and patient was never given any antibiotics. Sputum gram stain shows abundant GPC's and GNR's. Have discussed with day PCCM Dr Marcello Moores who is in agreement. Will resume antibiotics.

## 2018-04-07 NOTE — Progress Notes (Addendum)
eLink Physician-Brief Progress Note Patient Name: Joshwa Hemric DOB: [birth year] MRN: [id number]   Date of Service  04/07/2018  HPI/Events of Note  AFIB with RVR - Ventricular rate = 110's to 150's. BP = 96/58.  eICU Interventions  Will order: 1. BMP and Mg++ level STAT.  2. Amiodarone IV load and infusion.  3. 12 Lead EKG STAT. 4. Cycle Troponin.      Intervention Category Major Interventions: Arrhythmia - evaluation and management  Kadyn Guild [place] 04/07/2018, 9:41 PM

## 2018-04-07 NOTE — Progress Notes (Signed)
SLP Cancellation Note  Patient Details Name: Hector Pugh MRN: [id number] DOB: 1961/02/23   Cancelled treatment:       Reason Eval/Treat Not Completed: Patient not medically ready  Gabriel Rainwater [place], [place] [phone]   [place] 04/07/2018, 8:08 AM

## 2018-04-07 NOTE — Progress Notes (Deleted)
PEA cardiac arrest 5:44-5:46am, CPR initiated and 1 Epi given. ROSC attained. Patient remains gravely ill. Family was called and updated.

## 2018-04-08 ENCOUNTER — Encounter (HOSPITAL_COMMUNITY): Payer: Self-pay | Admitting: Neurosurgery

## 2018-04-08 ENCOUNTER — Inpatient Hospital Stay (HOSPITAL_COMMUNITY): Payer: Managed Care, Other (non HMO)

## 2018-04-08 DIAGNOSIS — Z9889 Other specified postprocedural states: Secondary | ICD-10-CM

## 2018-04-08 DIAGNOSIS — I615 Nontraumatic intracerebral hemorrhage, intraventricular: Principal | ICD-10-CM

## 2018-04-08 LAB — TYPE AND SCREEN
ABO/RH(D): O POS
ANTIBODY SCREEN: NEGATIVE
Unit division: 0
Unit division: 0

## 2018-04-08 LAB — TROPONIN I: Troponin I: <0.03 ng/mL (ref <0.03)

## 2018-04-08 LAB — BASIC METABOLIC PANEL
Anion gap: 8 (ref 5–15)
BUN: 14 mg/dL (ref 6–20)
CO2: 22 mmol/L (ref 22–32)
Calcium: 7.4 mg/dL — ABNORMAL LOW (ref 8.9–10.3)
Chloride: 118 mmol/L — ABNORMAL HIGH (ref 101–111)
Creatinine, Ser: 0.79 mg/dL (ref 0.61–1.24)
GFR calc Af Amer: >60 mL/min (ref >60)
GFR calc non Af Amer: >60 mL/min (ref >60)
Glucose, Bld: 155 mg/dL — ABNORMAL HIGH (ref 65–99)
Potassium: 3.1 mmol/L — ABNORMAL LOW (ref 3.5–5.1)
Sodium: 148 mmol/L — ABNORMAL HIGH (ref 135–145)

## 2018-04-08 LAB — BPAM RBC
BLOOD PRODUCT EXPIRATION DATE: 201905282359
Blood Product Expiration Date: 201905282359
ISSUE DATE / TIME: 201905050931
UNIT TYPE AND RH: 5100
Unit Type and Rh: 5100

## 2018-04-08 LAB — PROCALCITONIN: Procalcitonin: <0.1 ng/mL

## 2018-04-08 LAB — GLUCOSE, CAPILLARY
GLUCOSE-CAPILLARY: 156 mg/dL — AB (ref 65–99)
GLUCOSE-CAPILLARY: 147 mg/dL — AB (ref 65–99)
Glucose-Capillary: 124 mg/dL — ABNORMAL HIGH (ref 65–99)
Glucose-Capillary: 159 mg/dL — ABNORMAL HIGH (ref 65–99)
Glucose-Capillary: 125 mg/dL — ABNORMAL HIGH (ref 65–99)
Glucose-Capillary: 199 mg/dL — ABNORMAL HIGH (ref 65–99)

## 2018-04-08 LAB — SODIUM
SODIUM: 151 mmol/L — AB (ref 135–145)
Sodium: 151 mmol/L — ABNORMAL HIGH (ref 135–145)
Sodium: 149 mmol/L — ABNORMAL HIGH (ref 135–145)

## 2018-04-08 LAB — MAGNESIUM: Magnesium: 2 mg/dL (ref 1.7–2.4)

## 2018-04-08 MED ORDER — ORAL CARE MOUTH RINSE
15.0000 mL | Freq: Two times a day (BID) | OROMUCOSAL | Status: DC
  Administered 2018-04-08 – 2018-04-10 (×7): 15 mL via OROMUCOSAL

## 2018-04-08 MED ORDER — LEVOTHYROXINE SODIUM 50 MCG PO TABS
50.0000 ug | ORAL_TABLET | Freq: Every day | ORAL | Status: DC
  Administered 2018-04-09 – 2018-04-15 (×7): 50 ug via ORAL
  Filled 2018-04-08 (×7): qty 1

## 2018-04-08 MED ORDER — PANTOPRAZOLE SODIUM 40 MG PO TBEC
40.0000 mg | DELAYED_RELEASE_TABLET | Freq: Every day | ORAL | Status: DC
  Administered 2018-04-09 – 2018-04-15 (×7): 40 mg via ORAL
  Filled 2018-04-08 (×7): qty 1

## 2018-04-08 MED ORDER — INSULIN ASPART 100 UNIT/ML ~~LOC~~ SOLN
0.0000 [IU] | Freq: Three times a day (TID) | SUBCUTANEOUS | Status: DC
Start: 2018-04-08 — End: 2018-04-15
  Administered 2018-04-08: 1 [IU] via SUBCUTANEOUS
  Administered 2018-04-09 – 2018-04-10 (×4): 2 [IU] via SUBCUTANEOUS
  Administered 2018-04-10: 1 [IU] via SUBCUTANEOUS
  Administered 2018-04-10 – 2018-04-11 (×2): 2 [IU] via SUBCUTANEOUS
  Administered 2018-04-11: 1 [IU] via SUBCUTANEOUS
  Administered 2018-04-12: 2 [IU] via SUBCUTANEOUS
  Administered 2018-04-12: 1 [IU] via SUBCUTANEOUS
  Administered 2018-04-12: 2 [IU] via SUBCUTANEOUS
  Administered 2018-04-13 (×2): 3 [IU] via SUBCUTANEOUS
  Administered 2018-04-13: 1 [IU] via SUBCUTANEOUS
  Administered 2018-04-14: 2 [IU] via SUBCUTANEOUS
  Administered 2018-04-14: 3 [IU] via SUBCUTANEOUS
  Administered 2018-04-14: 2 [IU] via SUBCUTANEOUS
  Administered 2018-04-15: 3 [IU] via SUBCUTANEOUS
  Administered 2018-04-15: 1 [IU] via SUBCUTANEOUS

## 2018-04-08 MED ORDER — MAGNESIUM SULFATE 2 GM/50ML IV SOLN
2.0000 g | Freq: Once | INTRAVENOUS | Status: AC
  Administered 2018-04-08: 2 g via INTRAVENOUS
  Filled 2018-04-08: qty 50

## 2018-04-08 MED ORDER — LEVETIRACETAM 500 MG PO TABS
500.0000 mg | ORAL_TABLET | Freq: Two times a day (BID) | ORAL | Status: DC
Start: 2018-04-08 — End: 2018-04-15
  Administered 2018-04-08 – 2018-04-15 (×14): 500 mg via ORAL
  Filled 2018-04-08 (×14): qty 1

## 2018-04-08 MED ORDER — IBUPROFEN 200 MG PO TABS
400.0000 mg | ORAL_TABLET | Freq: Once | ORAL | Status: AC
  Administered 2018-04-08: 400 mg via ORAL
  Filled 2018-04-08: qty 2

## 2018-04-08 MED ORDER — POTASSIUM CHLORIDE 10 MEQ/100ML IV SOLN
10.0000 meq | INTRAVENOUS | Status: AC
  Administered 2018-04-08 (×4): 10 meq via INTRAVENOUS
  Filled 2018-04-08 (×4): qty 100

## 2018-04-08 MED ORDER — ENSURE ENLIVE PO LIQD
237.0000 mL | Freq: Two times a day (BID) | ORAL | Status: DC
  Administered 2018-04-09 – 2018-04-13 (×6): 237 mL via ORAL

## 2018-04-08 MED ORDER — INSULIN ASPART 100 UNIT/ML ~~LOC~~ SOLN
0.0000 [IU] | Freq: Every day | SUBCUTANEOUS | Status: DC
  Administered 2018-04-13: 2 [IU] via SUBCUTANEOUS

## 2018-04-08 MED ORDER — LABETALOL HCL 5 MG/ML IV SOLN
10.0000 mg | INTRAVENOUS | Status: DC | PRN
  Administered 2018-04-08: 20 mg via INTRAVENOUS
  Administered 2018-04-08 – 2018-04-09 (×2): 10 mg via INTRAVENOUS
  Administered 2018-04-09 – 2018-04-10 (×3): 20 mg via INTRAVENOUS
  Filled 2018-04-08 (×5): qty 4

## 2018-04-08 MED ORDER — LOSARTAN POTASSIUM 50 MG PO TABS
50.0000 mg | ORAL_TABLET | Freq: Two times a day (BID) | ORAL | Status: DC
  Administered 2018-04-08 – 2018-04-15 (×14): 50 mg via ORAL
  Filled 2018-04-08 (×14): qty 1

## 2018-04-08 MED FILL — Thrombin For Soln 5000 Unit: CUTANEOUS | Qty: 5000 | Status: AC

## 2018-04-08 MED FILL — Gelatin Absorbable MT Powder: OROMUCOSAL | Qty: 1 | Status: AC

## 2018-04-08 NOTE — Progress Notes (Addendum)
STROKE TEAM PROGRESS NOTE   SUBJECTIVE (INTERVAL HISTORY) His daughter is at the bedside.  He is awake and alert. Now extubated. For swallow evaluation today. Remains on cardene and 3%. Getting magnesium and potassium bolus. Has R hemiparesis arm > leg and aphasia (language better in native tongue). Discussed onset and family copig with daughter, support offered.   OBJECTIVE Temp:  [98.3 F (36.8 C)-99.5 F (37.5 C)] 98.3 F (36.8 C) (05/06 0400) Pulse Rate:  [42-132] 89 (05/06 1000) Cardiac Rhythm: Sinus tachycardia (05/06 0800) Resp:  [0-30] 18 (05/06 1000) BP: (94-163)/(56-91) 131/69 (05/06 0900) SpO2:  [93 %-100 %] 99 % (05/06 1000) FiO2 (%):  [30 %] 30 % (05/05 1329)  Recent Labs  Lab 04/07/18 1701 04/07/18 1931 04/07/18 2352 04/08/18 0338 04/08/18 0829  GLUCAP 168* 134* 126* 156* 124*   Recent Labs  Lab 04/05/18 0925 04/05/18 0935 04/05/18 1054 04/05/18 1117 04/06/18 0312  04/07/18 0454 04/07/18 1120 04/07/18 1705 04/07/18 2140 04/07/18 2226 04/08/18 0354  NA 133* 132* 131* 129* 137   < > 146* 146* 146* 147* 148* 148*  K 4.0 3.9 3.9 4.0 3.9  --  3.7  --   --  3.2*  --  3.1*  CL 99* 99*  --   --  105  --  113*  --   --  118*  --  118*  CO2 21*  --   --   --  24  --  26  --   --  24  --  22  GLUCOSE 217* 216* 212*  --  128*  --  138*  --   --  133*  --  155*  BUN 17 21*  --   --  17  --  20  --   --  16  --  14  CREATININE 0.94 0.70  --   --  0.94  --  0.94  --   --  0.76  --  0.79  CALCIUM 9.0  --   --   --  7.6*  --  7.9*  --   --  7.4*  --  7.4*  MG  --   --   --   --   --   --  2.0  --   --  2.0  --  2.0  PHOS  --   --   --   --   --   --  1.9*  --   --   --   --   --    < > = values in this interval not displayed.   Recent Labs  Lab 04/05/18 0925 04/07/18 0454  AST 23 16  ALT 20 12*  ALKPHOS 44 32*  BILITOT 1.4* 0.8  PROT 6.7 5.5*  ALBUMIN 4.0 3.0*   Recent Labs  Lab 04/05/18 0925  04/05/18 1054 04/05/18 1117 04/05/18 1741 04/06/18 0312  04/07/18 0454  WBC 14.2*  --   --   --   --  11.1* 7.4  NEUTROABS 12.0*  --   --   --   --   --   --   HGB 15.2   < > 14.6 13.9 13.1 11.3* 11.2*  HCT 45.3   < > 43.0 41.0 38.6* 34.6* 35.2*  MCV 84.7  --   --   --   --  85.9 89.3  PLT 159  --   --   --   --  159 113*   < > =  values in this interval not displayed.   Recent Labs  Lab 04/07/18 2140 04/08/18 0343  TROPONINI <0.03 <0.03   Recent Labs    04/07/18 0454  LABPROT 17.1*  INR 1.40   Recent Labs    04/06/18 0930  COLORURINE YELLOW  LABSPEC 1.028  PHURINE 5.0  GLUCOSEU NEGATIVE  HGBUR NEGATIVE  BILIRUBINUR NEGATIVE  KETONESUR 5*  PROTEINUR NEGATIVE  NITRITE NEGATIVE  LEUKOCYTESUR TRACE*       Component Value Date/Time   CHOL 99 04/06/2018 0325   TRIG 98 04/06/2018 0325   HDL 34 (L) 04/06/2018 0325   CHOLHDL 2.9 04/06/2018 0325   VLDL 20 04/06/2018 0325   LDLCALC 45 04/06/2018 0325   Lab Results  Component Value Date   HGBA1C 6.8 (H) 04/06/2018      Component Value Date/Time   LABOPIA NONE DETECTED 04/06/2018 0504   COCAINSCRNUR NONE DETECTED 04/06/2018 0504   LABBENZ POSITIVE (A) 04/06/2018 0504   AMPHETMU NONE DETECTED 04/06/2018 0504   THCU NONE DETECTED 04/06/2018 0504   LABBARB NONE DETECTED 04/06/2018 0504    Recent Labs  Lab 04/05/18 [place] <10    Ct Head Code Stroke Wo Contrast 04/05/2018 0944 1. Large hemorrhage left temporal lobe with surrounding edema and mass-effect. Intraventricular hemorrhage with mild hydrocephalus. 13 mm midline shift. Etiologies of hemorrhage are not determined on this study but could be due to hypertension, cerebral amyloid, vascular malformation, or less likely tumor. 2. ASPECTS is 10   Ct Angio Head W Or Wo Contrast 04/05/2018  1112 Stable LEFT temporal lobe hemorrhage, with LEFT-to-RIGHT midline shift. Lobar hypertensive bleed is favored. No underlying vascular anomaly, or arterial/venous thrombosis/embolus.   Ct Head Wo Contrast 04/05/2018 2228 1.  Postsurgical changes from interval partial evacuation of hematoma within the left temporal lobe with approximately 20 cc residual. 2. Stable small volume of intraventricular hemorrhage and small volume of subarachnoid hemorrhage predominantly over left convexity. 3. Decreased mass effect with 6 mm left-to-right midline shift, previously 13 mm. Improved patency of lateral ventricles. 4. No new acute intracranial abnormality identified.   Ct Head Wo Contrast 04/06/2018 0913 1. Similar postoperative appearance of residual blood products in the left temporal lobe following partial evacuation of the hematoma. 2. Stable intraventricular hemorrhage without hydrocephalus. 3. 6 mm left-to-right midline shift is stable.   Ct Head Wo Contrast 04/07/2018 1552 1. Stable to slight decrease in size of residual blood products in the left temporal lobe following partial vacuo a shin. 2. Slight decrease in midline shift. 3. Stable interventricular hemorrhage without hydrocephalus.   TTE  04/07/2018 - Left ventricle: The cavity size was normal. Wall thickness was normal. Systolic function was normal. The estimated ejection fraction was in the range of 60% to 65%. Wall motion was normal; there were no regional wall motion abnormalities. Left ventricular diastolic function parameters were normal. - Aortic valve: There was mild regurgitation. - Atrial septum: No defect or patent foramen ovale was identified. - Systemic veins: Patient ventilated so unable to accurately assess CVP with IVC assessment.  PHYSICAL EXAM General - Well nourished, well developed, in no apparent distress, extubated off sedation.  Cardiovascular - Regular rate and rhythm. Respiratory - clear to auscultation  Neuro - awake, alert. Knows self, not place or time. Will mimic (stick out tongue, lick lips). Inconsistently follows simple midline commands (smile, close eyes). Cannot show 2 fingers nor mimic 2 fingers. PERRL, left gaze preference, did not  cross midline, left gaze incomplete. Left mild ptosis, will  tracking buy not cross midline, blinking to visual threat on the left, but not on the right. LUE and LLE 5/5. RLE 4/5 and RUE 0/5 with mild withdraw. DTR 1+ and R babinski. Sensation intact bilaterally. coordination and gait not tested.   ASSESSMENT/PLAN Hector Pugh is a 57 y.o. male with history of pancreatic endocrine tumor s/p wipple procedure in 2012, DM, HTN admitted for slurry speech, N/V and right sided weakness. No tPA given due to [place].    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 Decreased hemorrhage and slight decrease midline shift. Stable IVH.no hydro  2D Echo  EF 60-65%. No source of embolus   LDL 45  HgbA1c 6.8  SCDs for VTE prophylaxis  NPO. 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  HgbA1c 6.8 goal < 7.0  Controlled  CBG monitoring  SSI  Hypertension BP stable   BP goal < 140  On cardene and propofol  Resume home losartan once able to swallow  AFIB w/ RVR  Hx post Whipple AF  Repeat onset 04/07/2018   Started on amiodarone  Other Active Problems  Pancreatic endocrine tumor s/p wipple procedure in 2012  Hypomagnesemia, replaced  Hypokalemia, replaced  Repeat  labs in am  Hospital day # [place], MSN, APRN, ANVP-BC, AGPCNP-BC Advanced Practice Stroke Nurse [place] for Schedule & Pager information 04/08/2018 10:36 AM   ATTENDING NOTE: I reviewed above note and agree with the assessment and plan. I have made any additions or clarifications directly to the above note. Pt was seen and examined.   Pt was seen with daughter and other family members. He also later walked with PT/OT in the hallway with assistance. Na 148 today, on 3% saline. Extubated yesterday and tolerating well. BP stable on low dose cardene, will taper off if able. Passed swallow this am, on dys 3 diet with thin liquid.  On exam, pt awake alert, following limited simple commands.  He was able to answer questions better when questions were given in his native language. However, he did state a couple of short sentences in [place] with mild dysarthria. Left gaze preference, not able to cross midline, not blinking to visual threat on the right. PERRL, right facial droop, tongue midline in mouth. LUE and LLE 5/5, RUE 3/5 proximal and 0/5 distal. RLE 3/5 proximal and distal. Positive babinski. DTR 1+. Sensation, coordination and gait not tested.  Had tachycardia episode last night, concerning for afib but EKG showed sinus tachy. He was put on amiodarone IV. CCM on board. This am he was in sinus. As per daughter, he did have an hour afib post Wipple procedure. Not sure if true afib overnight, will need continue tele.   Since he passed swallow, will  increase po BP meds and try to wean off cardene. Decrease IVF. Change keppra and synthroid and protonix from IV to po. Supplement K for hypokalemia. Continue 3% saline. Will repeat CT head in am. Continue PT/OT.   Rosalin Hawking, MD PhD Stroke Neurology 04/08/2018 2:17 PM  This patient is critically ill due to [place] s/p hematoma evacuation, brain edema, intubated and at significant risk of neurological worsening, death  form hematoma expansion, cerebral edema, brain herniation. This patient's care requires constant monitoring of vital signs, hemodynamics, respiratory and cardiac monitoring, review of multiple databases, neurological assessment, discussion with family, other specialists and medical decision making of high complexity. I spent 35 minutes of neurocritical care time in the care of this patient. I had long discussion with daughter at bedside, updated pt current condition, treatment plan and potential prognosis.      To contact Stroke Continuity provider, please refer to [email or web address] After hours, contact General Neurology

## 2018-04-08 NOTE — Evaluation (Signed)
Occupational Therapy Evaluation Patient Details Name: Hector Pugh MRN: [id number] DOB: 1960-12-06 Today's Date: 04/08/2018    History of Present Illness This 57 y.o. male admitted with c/o HA, vomiting, AMS, and aphasia.  CT of head showed large temporal hemorrhage.  He underwent emergent crani for evacuation.   PMH includes:  pancreatic CA, s/p whipple procedure and subsequent DM.  HTN,   Clinical Impression   Pt admitted with above. He demonstrates the below listed deficits and will benefit from continued OT to maximize safety and independence with BADLs.  Pt presents to OT with Rt inattention, Rt hemiparesis, impaired balance, impaired communication.   Cognition difficult to accurately determine due to communication deficits.  He currently requires min A - total A for ADLs and mod A +2 for functional mobility.   He lives with wife at home, was fully independent and works full time as a English as a second language teacher.  Feel he will benefit from [place] to allow him to return to highest level of functioning possible.       Follow Up Recommendations  CIR;Supervision/Assistance - 24 hour    Equipment Recommendations  None recommended by OT    Recommendations for Other Services Rehab consult     Precautions / Restrictions Precautions Precautions: Fall Precaution Comments: Rt inattention       Mobility Bed Mobility Overal bed mobility: Needs Assistance Bed Mobility: Supine to Sit     Supine to sit: Mod assist     General bed mobility comments: assist to initiate moving LEs off bed and to scoot to EOB   Transfers Overall transfer level: Needs assistance   Transfers: Sit to/from Stand;Stand Pivot Transfers Sit to Stand: Min assist;+2 physical assistance;+2 safety/equipment Stand pivot transfers: Mod assist;+2 physical assistance;+2 safety/equipment       General transfer comment: Pt requires assist to steady - demonstrates posterior bias when standing.  He requires assist/facilitation to weight  shift and to advance Rt LE as well as facilitation to maintain Lt  hip extension when advancing Rt LE     Balance Overall balance assessment: Needs assistance Sitting-balance support: Feet supported Sitting balance-Leahy Scale: Fair Sitting balance - Comments: static sitting with close min guard assist    Standing balance support: Single extremity supported Standing balance-Leahy Scale: Poor Standing balance comment: requires mod A for static standing                            ADL either performed or assessed with clinical judgement   ADL Overall ADL's : Needs assistance/impaired Eating/Feeding: Set up;Supervision/ safety;Sitting Eating/Feeding Details (indicate cue type and reason): Pt primarily uses Lt UE.  Per daughter, in their culture, people always feed themselves with their Rt UE  Grooming: Wash/dry hands;Wash/dry face;Minimal assistance;Sitting   Upper Body Bathing: Moderate assistance;Sitting   Lower Body Bathing: Maximal assistance;Sit to/from stand   Upper Body Dressing : Maximal assistance;Sitting   Lower Body Dressing: Maximal assistance;Sit to/from stand   Toilet Transfer: +2 for safety/equipment;+2 for physical assistance;Moderate assistance Toilet Transfer Details (indicate cue type and reason): assist for balance and to facillitate weight shift  Toileting- Clothing Manipulation and Hygiene: Maximal assistance;Sit to/from stand       Functional mobility during ADLs: Moderate assistance;+2 for physical assistance;+2 for safety/equipment       Vision   Additional Comments: Pt with Lt gaze preference.  He will look to Rt when auditory stimuli provided      Perception Perception Perception Tested?: Yes Perception  Deficits: Inattention/neglect Inattention/Neglect: Does not attend to right visual field;Does not attend to right side of body Spatial deficits: Rt inattention.    Praxis      Pertinent Vitals/Pain Pain Assessment: Faces Faces Pain  Scale: No hurt     Hand Dominance Right   Extremity/Trunk Assessment Upper Extremity Assessment Upper Extremity Assessment: RUE deficits/detail RUE Deficits / Details: Pt able to flex shoulder to ~120*, full finger flex/ext.   Pt with Rt inattention, and fails to incorporate Rt UE into activity consistently.  He will at times, spontaneously attempt to use it for self feeding ` RUE Sensation: decreased proprioception RUE Coordination: decreased fine motor;decreased gross motor   Lower Extremity Assessment Lower Extremity Assessment: Defer to PT evaluation   Cervical / Trunk Assessment Cervical / Trunk Assessment: Other exceptions Cervical / Trunk Exceptions: Pt maintains posterior pelvic tilt and thoracic flexion    Communication Communication Communication: Expressive difficulties;Receptive difficulties   Cognition Arousal/Alertness: Awake/alert;Lethargic Behavior During Therapy: Flat affect;Impulsive Overall Cognitive Status: Difficult to assess Area of Impairment: Attention;Awareness;Problem solving                   Current Attention Level: Sustained;Selective         Problem Solving: Slow processing;Decreased initiation;Requires verbal cues General Comments: Pt with Rt inattention. mildly impulsive.  Communication deficits make accurate assessment of cognition difficult    General Comments  VSS.  Daughter present and assisted with lines.  Family was instructed to talk to him on his Rt side and to stand on his Rt to facillitate attention to the Rt     Exercises     Shoulder Instructions      Home Living Family/patient expects to be discharged to:: Private residence Living Arrangements: Spouse/significant other Available Help at Discharge: Family;Available 24 hours/day Type of Home: House Home Access: Stairs to enter [employer] of Steps: 2 Entrance Stairs-Rails: None Home Layout: Multi-level;Able to live on main level with bedroom/bathroom      Bathroom Shower/Tub: Occupational psychologist: Standard     Home Equipment: None          Prior Functioning/Environment Level of Independence: Independent        Comments: 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  He has two MS degrees         OT Problem List: Decreased strength;Decreased range of motion;Decreased activity tolerance;Impaired balance (sitting and/or standing);Impaired vision/perception;Decreased coordination;Decreased cognition;Decreased safety awareness;Decreased knowledge of use of DME or AE;Impaired UE functional use      OT Treatment/Interventions: Self-care/ADL training;Neuromuscular education;DME and/or AE instruction;Therapeutic activities;Cognitive remediation/compensation;Visual/perceptual remediation/compensation;Patient/family education;Balance training    OT Goals(Current goals can be found in the care plan section) Acute Rehab OT Goals Patient Stated Goal: per daugther, to regain as much independence as possible  OT Goal Formulation: With patient/family Time For Goal Achievement: 04/22/18 Potential to Achieve Goals: Good ADL Goals Pt Will Perform Eating: with set-up;with supervision;sitting Pt Will Perform Grooming: with min assist;standing Pt Will Perform Upper Body Bathing: with supervision;sitting Pt Will Perform Lower Body Bathing: with min assist;sit to/from stand Pt Will Perform Upper Body Dressing: with min assist;sitting Pt Will Perform Lower Body Dressing: with min assist;sit to/from stand Pt Will Transfer to Toilet: with min assist;ambulating;regular height toilet;bedside commode Pt Will Perform Toileting - Clothing Manipulation and hygiene: with min assist;sit to/from stand Additional ADL Goal #1: Pt will locate items on Rt with min cues during ADLs  OT Frequency: Min 3X/week   Barriers to D/C:  Co-evaluation PT/OT/SLP Co-Evaluation/Treatment: Yes Reason for Co-Treatment: Complexity of the patient's impairments  (multi-system involvement);For patient/therapist safety;Necessary to address cognition/behavior during functional activity;To address functional/ADL transfers   OT goals addressed during session: ADL's and self-care      AM-PAC PT "6 Clicks" Daily Activity     Outcome Measure Help from another person eating meals?: A Little Help from another person taking care of personal grooming?: A Little Help from another person toileting, which includes using toliet, bedpan, or urinal?: A Lot Help from another person bathing (including washing, rinsing, drying)?: A Lot Help from another person to put on and taking off regular upper body clothing?: A Lot Help from another person to put on and taking off regular lower body clothing?: Total 6 Click Score: 13   End of Session Nurse Communication: Mobility status  Activity Tolerance: Patient tolerated treatment well Patient left: in chair;with call bell/phone within reach;with chair alarm set;with family/visitor present;with nursing/sitter in room  OT Visit Diagnosis: Unsteadiness on feet (R26.81);Hemiplegia and hemiparesis;Cognitive communication deficit (R41.841) Symptoms and signs involving cognitive functions: Other Nontraumatic ICH Hemiplegia - Right/Left: Right Hemiplegia - dominant/non-dominant: Dominant Hemiplegia - caused by: Other Nontraumatic intracranial hemorrhage                Time: 1225-1301 OT Time Calculation (min): 36 min Charges:  OT General Charges $OT Visit: 1 Visit OT Evaluation $OT Eval Moderate Complexity: 1 Mod G-Codes:     [facility], OTR/L [phone]   Lucille Passy M 04/08/2018, 2:32 PM

## 2018-04-08 NOTE — Progress Notes (Signed)
PT Cancellation Note  Patient Details Name: Hector Pugh MRN: [id number] DOB: 03/29/1961   Cancelled Treatment:    Reason Eval/Treat Not Completed: Patient not medically ready;Active bedrest order   Duncan Dull 04/08/2018, 7:59 AM Alben Deeds, PT DPT  Board Certified Neurologic Specialist [phone]

## 2018-04-08 NOTE — Progress Notes (Signed)
repleted 60MEQ KCL IV and 2g Mg IV.

## 2018-04-08 NOTE — Evaluation (Signed)
Clinical/Bedside Swallow Evaluation Patient Details  Name: Hector Pugh MRN: [id number] Date of Birth: 1961-07-02  Today's Date: 04/08/2018 Time: SLP Start Time (ACUTE ONLY): 0900 SLP Stop Time (ACUTE ONLY): 0916 SLP Time Calculation (min) (ACUTE ONLY): 16 min  Past Medical History:  Past Medical History:  Diagnosis Date  . 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 Cancer ([place])   . Diabetes mellitus ([place]) 11/10/2012  . Diabetes mellitus without complication ([facility])    diet controlled  . HTN (hypertension) 11/10/2012  . Hypertension   . 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 Pancreatic cancer ([place])   . Pancreatitis 11/10/2012  . Seasonal allergies    Past Surgical History:  Past Surgical History:  Procedure Laterality Date  . circucision    . circumsision    . COLONOSCOPY N/A 04/04/2018   Procedure: COLONOSCOPY;  Surgeon: Leighton Ruff, MD;  Location: [facility];  Service: Endoscopy;  Laterality: N/A;  . ERCP  11/08/2012   Procedure: ENDOSCOPIC RETROGRADE CHOLANGIOPANCREATOGRAPHY (ERCP);  Surgeon: Jeryl Columbia, MD;  Location: [facility];  Service: Gastroenterology;  Laterality: N/A;  . EUS  11/08/2012   Procedure: UPPER ENDOSCOPIC ULTRASOUND (EUS) LINEAR;  Surgeon: Arta Silence, MD;  Location: [facility];  Service: Endoscopy;  Laterality: N/A;  . FINE NEEDLE ASPIRATION  11/08/2012   Procedure: FINE NEEDLE ASPIRATION (FNA) LINEAR;  Surgeon: Arta Silence, MD;  Location: [facility];  Service: Endoscopy;  Laterality: N/A;  . HERNIA REPAIR     RIGHT  . LAPAROSCOPY  12/12/2012   Procedure: LAPAROSCOPY DIAGNOSTIC;  Surgeon: Stark Klein, MD;  Location: [facility];  Service: General;  Laterality: N/A;  . Left Foot Surgery    . Left Foot Surgery    . VASECTOMY    . WHIPPLE PROCEDURE  12/12/2012   Procedure: WHIPPLE PROCEDURE;  Surgeon: Stark Klein, MD;  Location: [facility];  Service: General;  Laterality: N/A;   HPI:  [name] is a 57 y.o.  male with history of pancreatic endocrine tumor s/p wipple procedure in 2012, DM, HTN admitted for slurred speech, N/V and right sided weakness. Dx with left temporal ICH s/p hematoma evacuation 5/3. Remained intubated and on the vent until 5/5.    Assessment / Plan / Recommendation Clinical Impression  Patient presents with evidence of decreased airway protection across consistencies characterized by both immediate and delayed throat clearing and cough post swallow. What appears to be a primarily pharyngeal phase dysphagia is likely a combination of effects of ICH and 2 day intubation. Recommend MBS to more objectively evaluate function and determine potential to initiate a po diet. MBS planned for 1000.  SLP Visit Diagnosis: Dysphagia, oropharyngeal phase (R13.12)    Aspiration Risk       Diet Recommendation NPO   Medication Administration: Via alternative means    Other  Recommendations Oral Care Recommendations: Oral care QID   Follow up Recommendations Inpatient Rehab        Swallow Study   General HPI: [name] is a 57 y.o. male with history of pancreatic endocrine tumor s/p wipple procedure in 2012, DM, HTN admitted for slurred speech, N/V and right sided weakness. Dx with left temporal ICH s/p hematoma evacuation 5/3. Remained intubated and on the vent until 5/5.  Type of Study: Bedside Swallow Evaluation Previous Swallow Assessment: none Diet Prior to this Study: NPO Temperature Spikes Noted: No Respiratory Status: Room air History of Recent Intubation: Yes Length of Intubations (days): 2 days  Date extubated: 04/07/18 Behavior/Cognition: Alert;Cooperative;Pleasant mood(global aphasia) Oral Cavity Assessment: Within Functional Limits Oral Care Completed by SLP: Yes Oral Cavity - Dentition: Adequate natural dentition Vision: Functional for self-feeding Self-Feeding Abilities: Able to feed self;Needs assist Patient Positioning: Upright in bed Baseline Vocal  Quality: Hoarse;Low vocal intensity Volitional Cough: Congested;Strong Volitional Swallow: Able to elicit    Oral/Motor/Sensory Function Overall Oral Motor/Sensory Function: Mild impairment Facial ROM: Reduced right;Suspected CN VII (facial) dysfunction(decreased left eye opening) Facial Symmetry: Within Functional Limits Facial Strength: Reduced right;Suspected CN VII (facial) dysfunction Facial Sensation: (TBD) Lingual ROM: Reduced right;Reduced left(suspect aphasia impacting ability to follow commands) Lingual Symmetry: Within Functional Limits Velum: Within Functional Limits Mandible: Within Functional Limits   Ice Chips Ice chips: Impaired Presentation: Spoon Pharyngeal Phase Impairments: Multiple swallows;Throat Clearing - Immediate   Thin Liquid Thin Liquid: Impaired Presentation: Cup;Self Fed(HOH assist) Pharyngeal  Phase Impairments: Multiple swallows;Throat Clearing - Delayed;Cough - Delayed    Nectar Thick Nectar Thick Liquid: Not tested   Honey Thick Honey Thick Liquid: Not tested   Puree Puree: Impaired Presentation: Spoon Pharyngeal Phase Impairments: Multiple swallows;Throat Clearing - Delayed   Solid   Gabriel Rainwater MA, CCC-SLP [phone]    Solid: Not tested        Merie Wulf Meryl 04/08/2018,9:22 AM

## 2018-04-08 NOTE — Progress Notes (Signed)
Rehab Admissions Coordinator Note:  Patient was screened by Cleatrice Burke for appropriateness for an Inpatient Acute Rehab Consult per PT and OT recommendations. At this time, we are recommending Inpatient Rehab consult.  Cleatrice Burke 04/08/2018, 5:31 PM  I can be reached at [phone].

## 2018-04-08 NOTE — Progress Notes (Signed)
Orthopedic Tech Progress Note Patient Details:  Rivaldo Hineman Mar 23, 1961 [id number]  Ortho Devices Type of Ortho Device: Prafo boot/shoe Ortho Device/Splint Location: rle Ortho Device/Splint Interventions: Application   Post Interventions Patient Tolerated: Well Instructions Provided: Care of device   Hildred Priest 04/08/2018, 3:02 PM RN stated that PT wanted prafo boot and therfore post op boot order is to be cancelled

## 2018-04-08 NOTE — Progress Notes (Signed)
Orthopedic Tech Progress Note Patient Details:  Hector Pugh 30-Jan-1961 [id number]  Ortho Devices Type of Ortho Device: Postop shoe/boot Ortho Device/Splint Location: rle Ortho Device/Splint Interventions: Application   Post Interventions Patient Tolerated: Well Instructions Provided: Care of device   Hildred Priest 04/08/2018, 2:11 PM

## 2018-04-08 NOTE — Progress Notes (Signed)
Called regarding persistent fever not responding well to Tylenol. Now has reached maximum daily dose of Tylenol. Also with headache.   DDx includes:  -Central/neurogenic fever secondary to [place]  -HCAP or UTI  -Infection at craniotomy site  May need cooling blanket for now. If no source identified or if patient's headache worsens, will need neurosurgery input and possible MRI brain with contrast to assess for developing infection at craniotomy site. CCM is being called for diagnosis/management of possible systemic infection/HCAP/UTI as well as for cooling.   Electronically signed: Dr. Kerney Elbe

## 2018-04-08 NOTE — Progress Notes (Signed)
Nutrition Follow-up  DOCUMENTATION CODES:   Not applicable  INTERVENTION:   Ensure Enlive po BID, each supplement provides 350 kcal and 20 grams of protein  NUTRITION DIAGNOSIS:   Inadequate oral intake related to dysphagia as evidenced by meal completion < 50%. Ongoing.   GOAL:   Patient will meet greater than or equal to 90% of their needs Progressing.   MONITOR:   PO intake, Supplement acceptance  REASON FOR ASSESSMENT:   Consult Enteral/tube feeding initiation and management  ASSESSMENT:   57 year old male who presented to ED on 04/05/18 with headache, vomiting, and AMS and was found to have large left ICH. Pt had colonoscopy the previous [facility] at [facility] and was drowsy from procedure. PMH significant for biliary stricture s/p biliary stent in 2013, diabetes mellitus, hypertension, and pancreatic cancer s/p Whipple in 2013.  Pt extubated. Pt unable to answer questions. Meal tray at bedside but entree is not present unknown quantity consumed.   5/6 diet advanced   Medications reviewed and include: colace, novolog, senokot Labs reviewed: Na 148 (H), K+ 3.1 (L)  Diet Order:   Diet Order           DIET DYS 3 Room service appropriate? Yes; Fluid consistency: Thin  Diet effective now          EDUCATION NEEDS:   No education needs have been identified at this time  Skin:  Skin Assessment: Skin Integrity Issues: Skin Integrity Issues:: Incisions Incisions: head  Last BM:  unknown/PTA  Height:   Ht Readings from Last 1 Encounters:  04/05/18 5\' 6"  (1.676 m)    Weight:   Wt Readings from Last 1 Encounters:  04/05/18 159 lb 13.3 oz (72.5 kg)    Ideal Body Weight:  64.5 kg  BMI:  Body mass index is 25.8 kg/m.  Estimated Nutritional Needs:   Kcal:  1612 kcal/day (PSU 2003b)  Protein:  110-125 grams/day (1.5-1.7 g/kg)  Fluid:  1.8-2.0 L/day  Maylon Peppers RD, LDN, CNSC [phone] Pager [phone] After Hours Pager

## 2018-04-08 NOTE — Evaluation (Signed)
Physical Therapy Evaluation Patient Details Name: Hector Pugh MRN: [id number] DOB: Dec 19, 1960 Today's Date: 04/08/2018   History of Present Illness  This 57 y.o. male admitted with c/o HA, vomiting, AMS, and aphasia.  CT of head showed large temporal hemorrhage.  He underwent emergent crani for evacuation.   PMH includes:  pancreatic CA, s/p whipple procedure and subsequent DM.  HTN,  Clinical Impression  Orders received for PT evaluation. Patient demonstrates deficits in functional mobility as indicated below. Will benefit from continued skilled PT to address deficits and maximize function. Will see as indicated and progress as tolerated.  Patient currently requires increased physical assist for all aspects of mobility. Prior to admission, patient was fiercely independent, working, and mobilizing without any difficulties. Patient currently demonstrating deficits requiring +2 physical moderate to maximal assist but appears highly motivated for therapy participation. Given current deficits, Prior level of function, and good deal of family support feel patient would be best suited for intensive and comprehensive therapies, recommending CIR at this time.  Educated patients' family on techniques to improve awareness and attention as well as ways for positioning to aid in functional recovery, family receptive and appreciative.    Follow Up Recommendations CIR    Equipment Recommendations  (TBD)    Recommendations for Other Services Rehab consult     Precautions / Restrictions Precautions Precautions: Fall Precaution Comments: Rt inattention       Mobility  Bed Mobility Overal bed mobility: Needs Assistance Bed Mobility: Supine to Sit     Supine to sit: Mod assist     General bed mobility comments: assist to initiate moving LEs off bed and to scoot to EOB   Transfers Overall transfer level: Needs assistance   Transfers: Sit to/from Stand;Stand Pivot Transfers Sit to Stand: Min  assist;+2 physical assistance;+2 safety/equipment Stand pivot transfers: Mod assist;+2 physical assistance;+2 safety/equipment       General transfer comment: Pt requires assist to steady - demonstrates posterior bias when standing.  He requires assist/facilitation to weight shift and to advance Rt LE as well as facilitation to maintain Lt  hip extension when advancing Rt LE   Ambulation/Gait Ambulation/Gait assistance: Max assist;+2 physical assistance;+2 safety/equipment(Max with fatigue after 20 ft) Ambulation Distance (Feet): 40 Feet Assistive device: 2 person hand held assist(wrap around support with gait belt) Gait Pattern/deviations: Step-through pattern;Decreased stride length;Scissoring;Trunk flexed;Leaning posteriorly Gait velocity: decreased Gait velocity interpretation: <1.31 ft/sec, indicative of household ambulator General Gait Details: patient with noted RLE internal rotation during loading response, manual faciliation of RLE stride required. Patient with some synergistic movement during swing phase limited ability for secone placement prior to loading response. Bilateral flexion hip/knees noted (increased with fatigue). 2 person wrap around support required  Stairs            Wheelchair Mobility    Modified Rankin (Stroke Patients Only) Modified Rankin (Stroke Patients Only) Pre-Morbid Rankin Score: No symptoms Modified Rankin: Severe disability     Balance Overall balance assessment: Needs assistance Sitting-balance support: Feet supported Sitting balance-Leahy Scale: Fair Sitting balance - Comments: static sitting with close min guard assist    Standing balance support: Single extremity supported Standing balance-Leahy Scale: Poor Standing balance comment: requires mod A for static standing                              Pertinent Vitals/Pain Pain Assessment: Faces Faces Pain Scale: No hurt    Home Living Family/patient expects to  be  discharged to:: Private residence Living Arrangements: Spouse/significant other Available Help at Discharge: Family;Available 24 hours/day Type of Home: House Home Access: Stairs to enter Entrance Stairs-Rails: None Entrance Stairs-Number of Steps: 2 Home Layout: Multi-level;Able to live on main level with bedroom/bathroom Home Equipment: None      Prior Function Level of Independence: Independent         Comments: 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  He has two MS degrees      Hand Dominance   Dominant Hand: Right    Extremity/Trunk Assessment   Upper Extremity Assessment Upper Extremity Assessment: RUE deficits/detail RUE Deficits / Details: Pt able to flex shoulder to ~120*, full finger flex/ext.   Pt with Rt inattention, and fails to incorporate Rt UE into activity consistently.  He will at times, spontaneously attempt to use it for self feeding ` RUE Sensation: decreased proprioception RUE Coordination: decreased fine motor;decreased gross motor    Lower Extremity Assessment Lower Extremity Assessment: RLE deficits/detail RLE Deficits / Details: patient with active movement against gravity gross motions, responds to multi modal cues for assessment. grossly 3+/5. Able to take on weight through functional standing but noted synergistic compensation at times with movement. RLE Sensation: decreased proprioception RLE Coordination: decreased fine motor;decreased gross motor    Cervical / Trunk Assessment Cervical / Trunk Assessment: Other exceptions Cervical / Trunk Exceptions: Pt maintains posterior pelvic tilt and thoracic flexion   Communication   Communication: Expressive difficulties;Receptive difficulties  Cognition Arousal/Alertness: Awake/alert;Lethargic Behavior During Therapy: Flat affect;Impulsive Overall Cognitive Status: Difficult to assess Area of Impairment: Attention;Awareness;Problem solving                   Current Attention Level:  Sustained;Selective         Problem Solving: Slow processing;Decreased initiation;Requires verbal cues General Comments: Pt with Rt inattention. mildly impulsive.  Communication deficits make accurate assessment of cognition difficult       General Comments General comments (skin integrity, edema, etc.): VSS.  Daughter present and assisted with lines.  Family was instructed to talk to him on his Rt side and to stand on his Rt to facillitate attention to the Rt     Exercises     Assessment/Plan    PT Assessment Patient needs continued PT services  PT Problem List         PT Treatment Interventions DME instruction;Gait training;Functional mobility training;Therapeutic activities;Therapeutic exercise;Balance training;Neuromuscular re-education;Cognitive remediation;Patient/family education    PT Goals (Current goals can be found in the Care Plan section)  Acute Rehab PT Goals Patient Stated Goal: per daugther, to regain as much independence as possible  PT Goal Formulation: With patient/family Time For Goal Achievement: 04/22/18 Potential to Achieve Goals: Good    Frequency Min 4X/week   Barriers to discharge        Co-evaluation PT/OT/SLP Co-Evaluation/Treatment: Yes Reason for Co-Treatment: Complexity of the patient's impairments (multi-system involvement);For patient/therapist safety;Necessary to address cognition/behavior during functional activity;To address functional/ADL transfers PT goals addressed during session: Mobility/safety with mobility OT goals addressed during session: ADL's and self-care       AM-PAC PT "6 Clicks" Daily Activity  Outcome Measure Difficulty turning over in bed (including adjusting bedclothes, sheets and blankets)?: Unable Difficulty moving from lying on back to sitting on the side of the bed? : Unable Difficulty sitting down on and standing up from a chair with arms (e.g., wheelchair, bedside commode, etc,.)?: Unable Help needed moving  to and from a bed to chair (  including a wheelchair)?: A Lot Help needed walking in hospital room?: A Lot Help needed climbing 3-5 steps with a railing? : A Lot 6 Click Score: 9    End of Session Equipment Utilized During Treatment: Gait belt Activity Tolerance: Patient tolerated treatment well Patient left: in chair;with call bell/phone within reach;with chair alarm set;with family/visitor present Nurse Communication: Mobility status PT Visit Diagnosis: Hemiplegia and hemiparesis;Other symptoms and signs involving the nervous system (R29.898)    Time: 6010-9323 PT Time Calculation (min) (ACUTE ONLY): 32 min   Charges:   PT Evaluation $PT Eval Moderate Complexity: 1 Mod     PT G Codes:        Alben Deeds, PT DPT  Board Certified Neurologic Specialist [place] 04/08/2018, 4:10 PM

## 2018-04-08 NOTE — Progress Notes (Signed)
Patient ID: Hector Pugh, male   DOB: 1960-12-13, 57 y.o.   MRN: [id number] BP 128/80   Pulse (!) 107   Temp 99.9 F (37.7 C) (Oral) Comment: MD called  Resp (!) 24   Ht 5\' 6"  (1.676 m)   Wt 72.5 kg (159 lb 13.3 oz)   SpO2 99%   BMI 25.80 kg/m  Alert, right facial droop Is speaking, following commands Wound is clean, dry, without signs of infection Passed swallow evaluation.  Moving extremities, right side is plegic

## 2018-04-08 NOTE — Progress Notes (Signed)
Modified Barium Swallow Progress Note  Patient Details  Name: Hector Pugh MRN: [id number] Date of Birth: [birth year]  Today's Date: 04/08/2018  Modified Barium Swallow completed.  Full report located under Chart Review in the Imaging Section.  Brief recommendations include the following:  Clinical Impression  Patient presents with a mild oropharyngeal dysphagia characterized by right sided facial and lingual weakness resulting in mildly prolonged oral transit of bolus, however functional, and mild vallecular residuals which are cleared with subsequent swallow. Full airway protection noted. Important to note intermittent throat clearing with eventual cough despite full airway protection observed.    Swallow Evaluation Recommendations       SLP Diet Recommendations: Dysphagia 3 (Mech soft) solids;Thin liquid   Liquid Administration via: Cup;Straw   Medication Administration: Whole meds with liquid   Supervision: Patient able to self feed;Full supervision/cueing for compensatory strategies   Compensations: Slow rate;Lingual sweep for clearance of pocketing;Small sips/bites   Postural Changes: Seated upright at 90 degrees   Oral Care Recommendations: Oral care BID     Gabriel Rainwater MA, CCC-SLP [phone]    Hector Pugh 04/08/2018,11:56 AM

## 2018-04-08 NOTE — Progress Notes (Signed)
SLP Cancellation Note  Patient Details Name: Hector Pugh MRN: [id number] DOB: [birth year]   Cancelled treatment:       Reason Eval/Treat Not Completed: Patient not medically ready  Gabriel Rainwater [place], [place] [phone]  [place] 04/08/2018, 8:26 AM

## 2018-04-08 NOTE — Progress Notes (Signed)
PULMONARY / CRITICAL CARE MEDICINE   CRITICAL CARE PROGRESS NOTE Patient Summary:    Name: Hector Pugh MRN: [id number] DOB: 03/08/1961    ADMISSION DATE:  04/05/2018 CHIEF COMPLAINT:  ICH HISTORY OF PRESENT ILLNESS:   57 y/o M who presented to [facility] on 5/3 with reports of headache, vomiting and altered mental status. The patient was in his usual state of health until 5/3 am.  He carries a history of pancreatic cancer s/p whipple (2013 / considered cure) with subsequent development of DM.  Prior to admit, he was functional / working full time.  He had a screening colonoscopy on 5/2 which was reportedly clean.  Family reports that the patient complained of headache on the am of 5/2 which they thought was associated with a lack of caffeine intake while NPO for procedure.  During the procedure, he reportedly received 8 mg versed and 50 mcg of fentanyl.  After the procedure, he was sleepy but appropriate.  The patient woke around 0500 and was appropriate.  His wife went to work as usual.  At some point between 0500 and 0800, he went to the kitchen to make tea and went back up stairs.  His wife called to check on him around 0800 and he did not answer the phone.  She called her daughter, who is an ICU nurse, to check on the patient.  On arrival, the patient was reportedly aphasic and had emesis on his shirt and in the bed.  EMS was activated.  He vomited again with EMS transport.  He presented with 4/5 strength per Neurology but did not make it to the OR with 4/5.  Work up in [place] revealed a large temporal hemorrhage.  The patient was taken emergently to the OR for evacuation.  He was returned to ICU on vent postoperatively.  PAST MEDICAL HISTORY :  He  has a past medical history of Biliary stricture (11/11/2012), Cancer ([place]), Diabetes mellitus ([place]) (11/10/2012), Diabetes mellitus without complication ([place]), HTN (hypertension) (11/10/2012), Hypertension, Obstructive jaundice (11/11/2012), Pancreatic cancer  ([place]), Pancreatitis (11/10/2012), and Seasonal allergies. PAST SURGICAL HISTORY: He  has a past surgical history that includes circucision; Hernia repair; Left Foot Surgery; Vasectomy; circumsision; Left Foot Surgery; EUS (11/08/2012); Fine needle aspiration (11/08/2012); ERCP (11/08/2012); laparoscopy (12/12/2012); Whipple procedure (12/12/2012); and Colonoscopy (N/A, 04/04/2018).    New Events  Tolerating extubation well Passed Swallow study PT working with patient Afib controlled with amiodarone Awake, follows commands  ROS- limited by critical condition  .  stroke: mapping our early stages of recovery book   Does not apply Once  . Chlorhexidine Gluconate Cloth  6 each Topical Daily  . insulin aspart  0-5 Units Subcutaneous QHS  . insulin aspart  0-9 Units Subcutaneous TID WC  . levETIRAcetam  500 mg Oral BID  . [START ON 04/09/2018] levothyroxine  50 mcg Oral QAC breakfast  . losartan  50 mg Oral BID  . mouth rinse  15 mL Mouth Rinse BID  . pantoprazole  40 mg Oral Daily  . pneumococcal 23 valent vaccine  0.5 mL Intramuscular Tomorrow-1000  . sennosides  5 mL Per Tube QHS  . sodium chloride flush  10-40 mL Intracatheter Q12H  . tamsulosin  0.4 mg Oral Daily    PHYSICAL EXAM:   Blood pressure 139/68, pulse (!) 101, temperature 98.6 F (37 C), temperature source Axillary, resp. rate 14, height 5\' 6"  (1.676 m), weight 159 lb 13.3 oz (72.5 kg), SpO2 100 %.  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 Range 04/07/2018 21:40 04/07/2018 22:26 04/08/2018 03:43 04/08/2018 03:54  Sodium Latest Ref Range: 135 - 145 mmol/L 147 (H) 148 (H)  148 (H)  Potassium Latest Ref Range: 3.5 - 5.1 mmol/L 3.2 (L)   3.1 (L)  Chloride  Latest Ref Range: 101 - 111 mmol/L 118 (H)   118 (H)  CO2 Latest Ref Range: 22 - 32 mmol/L 24   22  Glucose Latest Ref Range: 65 - 99 mg/dL 133 (H)   155 (H)  BUN Latest Ref Range: 6 - 20 mg/dL 16   14  Creatinine Latest Ref Range: 0.61 - 1.24 mg/dL 0.76   0.79  Calcium Latest Ref Range: 8.9 - 10.3 mg/dL 7.4 (L)   7.4 (L)  Anion gap Latest Ref Range: 5 - 15  5   8   Magnesium Latest Ref Range: 1.7 - 2.4 mg/dL 2.0   2.0  GFR, Est Non African American Latest Ref Range: >60 mL/min >60   >60  GFR, Est African American Latest Ref Range: >60 mL/min >60   >60  Troponin I Latest Ref Range: <0.03 ng/mL <0.03  <0.03   Procalcitonin Latest Units: ng/mL    <0.10    Results for Hector Pugh, Hector Pugh (MRN [id number]) as of 04/08/2018 12:21  Ref. Range 04/07/2018 04:54  WBC Latest Ref Range: 4.0 - 10.5 K/uL 7.4  RBC Latest Ref Range: 4.22 - 5.81 MIL/uL 3.94 (L)  Hemoglobin Latest Ref Range: 13.0 - 17.0 g/dL 11.2 (L)  HCT Latest Ref Range: 39.0 - 52.0 % 35.2 (L)  MCV Latest Ref Range: 78.0 - 100.0 fL 89.3  MCH Latest Ref Range: 26.0 - 34.0 pg 28.4  MCHC Latest Ref Range: 30.0 - 36.0 g/dL 31.8  RDW Latest Ref Range: 11.5 - 15.5 % 13.2  Platelets Latest Ref Range: 150 - 400 K/uL 113 (L)     Sputum 04-06-18   Gram Stain ABUNDANT WBC PRESENT, PREDOMINANTLY PMN  NO SQUAMOUS EPITHELIAL CELLS SEEN  ABUNDANT GRAM POSITIVE COCCI IN PAIRS  ABUNDANT GRAM NEGATIVE RODS  RARE GRAM POSITIVE RODS      CT brain 04-07-18 Brain: Residual blood within the left temporal lobe is again noted. The hyperdense component is stable to slightly decreased compared to the prior exam. Surrounding edema is unchanged. Intraventricular hemorrhage is again noted. There is no hydrocephalus. Midline shift is slightly decreased, now measuring 5 mm just above the foramen of Monro. No new hemorrhage is present. Minimal subarachnoid hemorrhage on the left is stable. The brainstem and cerebellum are normal. Vascular: No hyperdense vessel is  present. Skull: Left craniotomy is again noted. Calvarium is otherwise intact. Sinuses/Orbits: The paranasal sinuses and mastoid air cells are clear. The patient is intubated. Globes and orbits are within normal limits.   CHEST X-RAY:  04-07-18 Endotracheal tube tip is 4.1 cm above the carina. Enteric tube enters stomach with the tip not seen on this image. Stable cardiomediastinal silhouette with normal heart size. No pneumothorax. No pleural effusion. No pulmonary edema. Mild left basilar scarring versus atelectasis, stable. No acute consolidative airspace disease.   Ekg-  04-07-18 sinus- no acute changes 04-07-18- A fib, RVR     Echo- 04-07-18  -  Left ventricle: The cavity size was normal. Wall thickness was   normal. Systolic function was normal. The estimated ejection   fraction was in the range of 60% to 65%. Wall motion was normal;   there were no regional wall motion abnormalities. Left   ventricular diastolic function parameters were normal. - Aortic valve: There was mild regurgitation. - Atrial septum: No defect or patent foramen ovale was identified. - Systemic veins: Patient ventilated so unable to accurately assess   CVP with IVC assessment   Assessment- Plan:  57 y/o M admitted with large left ICH status post evacuation on 04/05/2018.   PULMONARY S/p extubation CXR was clear ? Of aspiration  No signs of aspiration pneumonia.   Patient was started on Zosyn overnight. If rpt cxr  Clear-> d/c zosyn soon  CARDIOVASCULAR Hypertension Goal systolic blood pressure [id number] Cardene drip as needed  RENAL Hypokalemia- repleted Replace electrolytes as needed Every 6 hour sodium while patient is on hypertonic saline    GASTROINTESTINAL History of pancreatic cancer status post Whipple 2013 Diet per SLP  HEMATOLOGIC Trend daily CBC SCDs for DVT prophylaxis  INFECTIOUS  We will continue to trend WBC. Started on Zosyn overnight for possible aspiration  pneumonia.   Very low evidence of aspiration pneumonia at this time most likely chemical pneumonitis.  Will monitor patient on antibiotics for the next 24 to 48 hours.  If patient improves will DC antibiotics. 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  ^^^^^^^^^^ I  Have personally spent  50  Minutes  In the care of this Patient providing Critical care Services; Time includes review of chart, labs, imaging, coordinating care with other physicians and healthcare team members. Also includes time for frequent reevaluation and additional treatment implementation due to change in clinical condiiton of patient. Excludes time spent for Procedure and Teaching.   ^^^^^^^^^^  Note subject to typographical and grammatical errors;   Any formal questions or concerns about the content, text, or information contained within the body of this dictation should be directly addressed to the physician  for  clarification.   Evans Lance, MD Pulmonary and [place] [facility] Pager: [phone]

## 2018-04-09 ENCOUNTER — Inpatient Hospital Stay (HOSPITAL_COMMUNITY): Payer: Managed Care, Other (non HMO)

## 2018-04-09 DIAGNOSIS — R000 Tachycardia, unspecified: Secondary | ICD-10-CM

## 2018-04-09 DIAGNOSIS — D62 Acute posthemorrhagic anemia: Secondary | ICD-10-CM | POA: Diagnosis not present

## 2018-04-09 DIAGNOSIS — E870 Hyperosmolality and hypernatremia: Secondary | ICD-10-CM

## 2018-04-09 DIAGNOSIS — R509 Fever, unspecified: Secondary | ICD-10-CM

## 2018-04-09 DIAGNOSIS — E876 Hypokalemia: Secondary | ICD-10-CM

## 2018-04-09 DIAGNOSIS — I611 Nontraumatic intracerebral hemorrhage in hemisphere, cortical: Secondary | ICD-10-CM

## 2018-04-09 DIAGNOSIS — R0682 Tachypnea, not elsewhere classified: Secondary | ICD-10-CM

## 2018-04-09 DIAGNOSIS — D696 Thrombocytopenia, unspecified: Secondary | ICD-10-CM

## 2018-04-09 DIAGNOSIS — I69391 Dysphagia following cerebral infarction: Secondary | ICD-10-CM

## 2018-04-09 DIAGNOSIS — I10 Essential (primary) hypertension: Secondary | ICD-10-CM | POA: Diagnosis present

## 2018-04-09 DIAGNOSIS — Z8507 Personal history of malignant neoplasm of pancreas: Secondary | ICD-10-CM

## 2018-04-09 DIAGNOSIS — E119 Type 2 diabetes mellitus without complications: Secondary | ICD-10-CM

## 2018-04-09 DIAGNOSIS — E039 Hypothyroidism, unspecified: Secondary | ICD-10-CM

## 2018-04-09 DIAGNOSIS — J189 Pneumonia, unspecified organism: Secondary | ICD-10-CM

## 2018-04-09 LAB — CBC
HEMATOCRIT: 32.8 % — AB (ref 39.0–52.0)
HEMOGLOBIN: 10.5 g/dL — AB (ref 13.0–17.0)
MCH: 27.9 pg (ref 26.0–34.0)
MCHC: 32 g/dL (ref 30.0–36.0)
MCV: 87 fL (ref 78.0–100.0)
Platelets: 145 10*3/uL — ABNORMAL LOW (ref 150–400)
RBC: 3.77 MIL/uL — AB (ref 4.22–5.81)
RDW: 13.3 % (ref 11.5–15.5)
WBC: 7.1 10*3/uL (ref 4.0–10.5)

## 2018-04-09 LAB — CULTURE, RESPIRATORY

## 2018-04-09 LAB — BASIC METABOLIC PANEL
ANION GAP: 9 (ref 5–15)
BUN: 12 mg/dL (ref 6–20)
CO2: 23 mmol/L (ref 22–32)
Calcium: 7.9 mg/dL — ABNORMAL LOW (ref 8.9–10.3)
Chloride: 117 mmol/L — ABNORMAL HIGH (ref 101–111)
Creatinine, Ser: 0.83 mg/dL (ref 0.61–1.24)
GFR calc Af Amer: >60 mL/min (ref >60)
GLUCOSE: 196 mg/dL — AB (ref 65–99)
POTASSIUM: 3 mmol/L — AB (ref 3.5–5.1)
Sodium: 149 mmol/L — ABNORMAL HIGH (ref 135–145)

## 2018-04-09 LAB — CULTURE, RESPIRATORY W GRAM STAIN: Culture: NORMAL

## 2018-04-09 LAB — GLUCOSE, CAPILLARY
Glucose-Capillary: 177 mg/dL — ABNORMAL HIGH (ref 65–99)
Glucose-Capillary: 185 mg/dL — ABNORMAL HIGH (ref 65–99)
Glucose-Capillary: 151 mg/dL — ABNORMAL HIGH (ref 65–99)
Glucose-Capillary: 132 mg/dL — ABNORMAL HIGH (ref 65–99)

## 2018-04-09 LAB — SODIUM
SODIUM: 156 mmol/L — AB (ref 135–145)
SODIUM: 157 mmol/L — AB (ref 135–145)
SODIUM: 146 mmol/L — AB (ref 135–145)

## 2018-04-09 LAB — PHOSPHORUS: Phosphorus: 1.8 mg/dL — ABNORMAL LOW (ref 2.5–4.6)

## 2018-04-09 MED ORDER — AMIODARONE HCL 200 MG PO TABS
200.0000 mg | ORAL_TABLET | Freq: Every day | ORAL | Status: DC
  Administered 2018-04-09 – 2018-04-15 (×7): 200 mg via ORAL
  Filled 2018-04-09 (×7): qty 1

## 2018-04-09 MED ORDER — IBUPROFEN 200 MG PO TABS
400.0000 mg | ORAL_TABLET | Freq: Once | ORAL | Status: DC
  Filled 2018-04-09: qty 2

## 2018-04-09 MED ORDER — BUTALBITAL-APAP-CAFFEINE 50-325-40 MG PO TABS
1.0000 | ORAL_TABLET | Freq: Three times a day (TID) | ORAL | Status: DC | PRN
  Administered 2018-04-09 – 2018-04-15 (×4): 1 via ORAL
  Filled 2018-04-09 (×5): qty 1

## 2018-04-09 MED ORDER — CLEVIDIPINE BUTYRATE 0.5 MG/ML IV EMUL
0.0000 mg/h | INTRAVENOUS | Status: DC
  Administered 2018-04-09: 6 mg/h via INTRAVENOUS
  Administered 2018-04-09: 2 mg/h via INTRAVENOUS
  Administered 2018-04-09: 4 mg/h via INTRAVENOUS
  Administered 2018-04-10 (×3): 7 mg/h via INTRAVENOUS
  Filled 2018-04-09 (×6): qty 50

## 2018-04-09 MED ORDER — POTASSIUM CHLORIDE CRYS ER 20 MEQ PO TBCR
30.0000 meq | EXTENDED_RELEASE_TABLET | ORAL | Status: AC
  Administered 2018-04-09 (×2): 30 meq via ORAL
  Filled 2018-04-09 (×2): qty 1

## 2018-04-09 MED ORDER — AMLODIPINE BESYLATE 10 MG PO TABS
10.0000 mg | ORAL_TABLET | Freq: Every day | ORAL | Status: DC
  Administered 2018-04-09 – 2018-04-15 (×7): 10 mg via ORAL
  Filled 2018-04-09 (×7): qty 1

## 2018-04-09 MED ORDER — ONDANSETRON HCL 4 MG/2ML IJ SOLN
4.0000 mg | Freq: Once | INTRAMUSCULAR | Status: AC
  Administered 2018-04-09: 4 mg via INTRAVENOUS
  Filled 2018-04-09: qty 2

## 2018-04-09 MED ORDER — K PHOS MONO-SOD PHOS DI & MONO 155-852-130 MG PO TABS
500.0000 mg | ORAL_TABLET | Freq: Once | ORAL | Status: AC
  Administered 2018-04-09: 500 mg via ORAL
  Filled 2018-04-09: qty 2

## 2018-04-09 NOTE — Progress Notes (Signed)
[facility] ADULT ICU REPLACEMENT PROTOCOL FOR AM LAB REPLACEMENT ONLY  The patient does apply for the [facility] Adult ICU Electrolyte Replacment Protocol based on the criteria listed below:   1. Is GFR >/= 40 ml/min? Yes.    Patient's GFR today is >60 2. Is urine output >/= 0.5 ml/kg/hr for the last 6 hours? Yes.   Patient's UOP is 2.73 ml/kg/hr 3. Is BUN < 60 mg/dL? Yes.    Patient's BUN today is 12 4. Abnormal electrolyte K 3.0 5. Ordered repletion with: per protocol 6. If a panic level lab has been reported, has the CCM MD in charge been notified? Yes.  .   Physician:  Illene Labrador, Canary Brim 04/09/2018 6:28 AM

## 2018-04-09 NOTE — Progress Notes (Signed)
  Speech Language Pathology Treatment: Dysphagia  Patient Details Name: Terral Cooks MRN: [id number] DOB: [birth year] Today's Date: 04/09/2018 Time: 8350-7573 SLP Time Calculation (min) (ACUTE ONLY): 22 min  Assessment / Plan / Recommendation Clinical Impression  Pt tolerating diet well, observed with over 8 oz of water with a straw with suboptimal positioning. No signs of aspiration. Pt refused breakfast foods, son will give him a banana later. Given slightly prolonged oral phase as well as right UE weakness and right visual deficits will continue a mechanical soft diet to make self feeding with assist easier. Will sign off for swallowing.   HPI HPI: Mr. Romond Pipkins is a 57 y.o. male with history of pancreatic endocrine tumor s/p wipple procedure in 2012, DM, HTN admitted for slurred speech, N/V and right sided weakness. Dx with left temporal ICH s/p hematoma evacuation 5/3. Remained intubated and on the vent until 5/5.       SLP Plan  All goals met       Recommendations  Diet recommendations: Dysphagia 3 (mechanical soft);Thin liquid Liquids provided via: Cup;Straw Medication Administration: Whole meds with liquid Supervision: Intermittent supervision to cue for compensatory strategies;Staff to assist with self feeding Compensations: Slow rate;Small sips/bites                Plan: All goals met       GO                Delano Frate, Katherene Ponto 04/09/2018, 10:13 AM

## 2018-04-09 NOTE — Progress Notes (Signed)
PULMONARY / CRITICAL CARE MEDICINE  PROGRESS NOTE   Patient Summary:    Name: Hector Pugh MRN: [id number] DOB: [birth year]    ADMISSION DATE:  04/05/2018 CHIEF COMPLAINT:  ICH HISTORY OF PRESENT ILLNESS:   57 y/o M who presented to [facility] on 5/3 with reports of headache, vomiting and altered mental status. The patient was in his usual state of health until 5/3 am.  He carries a history of pancreatic cancer s/p whipple (2013 / considered cure) with subsequent development of DM.  Prior to admit, he was functional / working full time.  He had a screening colonoscopy on 5/2 which was reportedly clean.  Family reports that the patient complained of headache on the am of 5/2 which they thought was associated with a lack of caffeine intake while NPO for procedure.  During the procedure, he reportedly received 8 mg versed and 50 mcg of fentanyl.  After the procedure, he was sleepy but appropriate.  The patient woke around 0500 and was appropriate.  His wife went to work as usual.  At some point between 0500 and 0800, he went to the kitchen to make tea and went back up stairs.  His wife called to check on him around 0800 and he did not answer the phone.  She called her daughter, who is an ICU nurse, to check on the patient.  On arrival, the patient was reportedly aphasic and had emesis on his shirt and in the bed.  EMS was activated.  He vomited again with EMS transport.  He presented with 4/5 strength per Neurology but did not make it to the OR with 4/5.  Work up in [place] revealed a large temporal hemorrhage.  The patient was taken emergently to the OR for evacuation.  He was returned to ICU on vent postoperatively.  PAST MEDICAL HISTORY :  He  has a past medical history of Biliary stricture (11/11/2012), Cancer ([place]), Diabetes mellitus ([place]) (11/10/2012), Diabetes mellitus without complication ([place]), HTN (hypertension) (11/10/2012), Hypertension, Obstructive jaundice (11/11/2012), Pancreatic cancer ([place]), Pancreatitis  (11/10/2012), and Seasonal allergies. PAST SURGICAL HISTORY: He  has a past surgical history that includes circucision; Hernia repair; Left Foot Surgery; Vasectomy; circumsision; Left Foot Surgery; EUS (11/08/2012); Fine needle aspiration (11/08/2012); ERCP (11/08/2012); laparoscopy (12/12/2012); Whipple procedure (12/12/2012); and Colonoscopy (N/A, 04/04/2018).    New Events  04-08-18 Tolerating extubation well Passed Swallow study PT working with patient Afib controlled with amiodarone Awake, follows commands   04-09-18 No fever, cough, diarrhea. Tolerating po. On 3% saline.  ROS- limited by critical condition.meds  .  stroke: mapping our early stages of recovery book   Does not apply Once  . amiodarone  200 mg Oral Daily  . amLODipine  10 mg Oral Daily  . Chlorhexidine Gluconate Cloth  6 each Topical Daily  . feeding supplement (ENSURE ENLIVE)  237 mL Oral BID BM  . ibuprofen  400 mg Oral Once  . insulin aspart  0-5 Units Subcutaneous QHS  . insulin aspart  0-9 Units Subcutaneous TID WC  . levETIRAcetam  500 mg Oral BID  . levothyroxine  50 mcg Oral QAC breakfast  . losartan  50 mg Oral BID  . mouth rinse  15 mL Mouth Rinse BID  . pantoprazole  40 mg Oral Daily  . pneumococcal 23 valent vaccine  0.5 mL Intramuscular Tomorrow-1000  . potassium chloride  30 mEq Oral Q4H  . sennosides  5 mL Per Tube QHS  . sodium chloride flush  10-40 mL Intracatheter  Q12H  . tamsulosin  0.4 mg Oral Daily     PHYSICAL EXAM:    Awake, alert, follows commands  Today's Vitals   04/09/18 0752 04/09/18 0800 04/09/18 0844 04/09/18 0900  BP:  137/77  131/75  Pulse:  (!) 102  (!) 109  Resp:  (!) 22  (!) 22  Temp:  98.8 F (37.1 C)    TempSrc:  Axillary    SpO2:  98%  98%  Weight:      Height:      PainSc: 8   5     Eyes: Pupils equal,No pallor  Neck: No JVD  CVS:  -s1 s2 ; no murmur  Resp:Breath sounds equal; no rales  Abdomen:abd-soft, non tender,    BS+  Extremities:No edema  Neuro:   Mild weakness  on right arm/leg  Skin: no rash    LABS:    CBC Latest Ref Rng & Units 04/09/2018 04/07/2018 04/06/2018  WBC 4.0 - 10.5 K/uL 7.1 7.4 11.1(H)  Hemoglobin 13.0 - 17.0 g/dL 10.5(L) 11.2(L) 11.3(L)  Hematocrit 39.0 - 52.0 % 32.8(L) 35.2(L) 34.6(L)  Platelets 150 - 400 K/uL 145(L) 113(L) 159    BMP Latest Ref Rng & Units 04/09/2018 04/08/2018 04/08/2018  Glucose 65 - 99 mg/dL 196(H) - -  BUN 6 - 20 mg/dL 12 - -  Creatinine 0.61 - 1.24 mg/dL 0.83 - -  Sodium 135 - 145 mmol/L 149(H) 149(H) 151(H)  Potassium 3.5 - 5.1 mmol/L 3.0(L) - -  Chloride 101 - 111 mmol/L 117(H) - -  CO2 22 - 32 mmol/L 23 - -  Calcium 8.9 - 10.3 mg/dL 7.9(L) - -   Phos- 1.8   Sputum 04-06-18      Gram Stain ABUNDANT WBC PRESENT, PREDOMINANTLY PMN  NO SQUAMOUS EPITHELIAL CELLS SEEN  ABUNDANT GRAM POSITIVE COCCI IN PAIRS  ABUNDANT GRAM NEGATIVE RODS  RARE GRAM POSITIVE RODS     Culture Consistent with normal respiratory flora.         CT brain 04-07-18 Brain: Residual blood within the left temporal lobe is again noted. The hyperdense component is stable to slightly decreased compared to the prior exam. Surrounding edema is unchanged. Intraventricular hemorrhage is again noted. There is no hydrocephalus. Midline shift is slightly decreased, now measuring 5 mm just above the foramen of Monro. No new hemorrhage is present. Minimal subarachnoid hemorrhage on the left is stable. The brainstem and cerebellum are normal. Vascular: No hyperdense vessel is present. Skull: Left craniotomy is again noted. Calvarium is otherwise intact. Sinuses/Orbits: The paranasal sinuses and mastoid air cells are clear. The patient is intubated. Globes and orbits are within normal Limits.  Ct brain 04-09-18 1. Stable left temporal lobe hemorrhage, 5 mm left-to-right midline shift, small volume subarachnoid hemorrhage, and small volume intraventricular hemorrhage. 2. No new acute intracranial abnormality.  CHEST  X-RAY:    04-09-18 Minimal left basilar atelectasis since extubation. No alveolar pneumonia nor CHF.  Ekg-  04-07-18 sinus- no acute changes 04-07-18- A fib, RVR     Echo- 04-07-18  - Left ventricle: The cavity size was normal. Wall thickness was   normal. Systolic function was normal. The estimated ejection   fraction was in the range of 60% to 65%. Wall motion was normal;   there were no regional wall motion abnormalities. Left   ventricular diastolic function parameters were normal. - Aortic valve: There was mild regurgitation. - Atrial septum: No defect or patent foramen ovale was identified. - Systemic veins: Patient ventilated  so unable to accurately assess   CVP with IVC assessment   Assessment- Plan:  57 y/o M admitted with large left ICH status post evacuation on 04/05/2018.   PULMONARY S/p extubation CXR  Clear IS as able to   No evidence  of aspiration pneumonia.   Stopped  Zosyn   CARDIOVASCULAR Hypertension Goal systolic blood pressure [phone] Amlodipine, losaartan, clevidipine  Afib >sinus; amiodarone changed to po Consider Cardiology input eventually. 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  ^^^^^^^^^^ I  Have personally spent  55  Minutes  In the care of this Patient providing Critical care Services; Time  includes review of chart, labs, imaging, coordinating care with other physicians and healthcare team members. Also includes time for frequent reevaluation and additional treatment implementation due to change in clinical condiiton of patient. Excludes time spent for Procedure and Teaching.   ^^^^^^^^^^  Note subject to typographical and grammatical errors;   Any formal questions or concerns about the content, text, or information contained within the body of this dictation should be directly addressed to the physician  for  clarification.   Evans Lance, MD Pulmonary and [place] [facility] Pager: [phone]

## 2018-04-09 NOTE — Progress Notes (Signed)
Patient ID: Hector Pugh, male   DOB: 03-13-1961, 57 y.o.   MRN: [id number] BP 128/76   Pulse 95   Temp 97.9 F (36.6 C) (Axillary)   Resp 18   Ht 5\' 6"  (1.676 m)   Wt 72.5 kg (159 lb 13.3 oz)   SpO2 97%   BMI 25.80 kg/m  Alert, speaking, non fluent, speech is clear Moving all extremities Wound is clean, and dry without signs of infection Doing very well today

## 2018-04-09 NOTE — Evaluation (Signed)
Speech Language Pathology Evaluation Patient Details Name: Hector Pugh MRN: [id number] DOB: May 17, 1961 Today's Date: 04/09/2018 Time: 3016-0109 SLP Time Calculation (min) (ACUTE ONLY): 22 min  Problem List:  Patient Active Problem List   Diagnosis Date Noted  . ICH (intracerebral hemorrhage) ([place]) 04/05/2018  . Acute pancreatitis 06/11/2014  . Headache 06/11/2014  . 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 Obstructive jaundice 11/11/2012  . Biliary stricture 11/11/2012  . Diabetes mellitus ([place]) 11/10/2012  . HTN (hypertension) 11/10/2012  . Pancreatitis 11/10/2012  . Abdominal pain 11/10/2012   Past Medical History:  Past Medical History:  Diagnosis Date  . 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 Cancer ([place])   . Diabetes mellitus ([place]) 11/10/2012  . Diabetes mellitus without complication ([facility])    diet controlled  . HTN (hypertension) 11/10/2012  . Hypertension   . 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 Pancreatic cancer ([place])   . Pancreatitis 11/10/2012  . Seasonal allergies    Past Surgical History:  Past Surgical History:  Procedure Laterality Date  . circucision    . circumsision    . COLONOSCOPY N/A 04/04/2018   Procedure: COLONOSCOPY;  Surgeon: Leighton Ruff, MD;  Location: [facility];  Service: Endoscopy;  Laterality: N/A;  . CRANIOTOMY Left 04/05/2018   Procedure: Left Temporal CRANIOTOMY HEMATOMA EVACUATION of Intracranial Hemorrhage;  Surgeon: Ashok Pall, MD;  Location: [facility];  Service: Neurosurgery;  Laterality: Left;  . ERCP  11/08/2012   Procedure: ENDOSCOPIC RETROGRADE CHOLANGIOPANCREATOGRAPHY (ERCP);  Surgeon: Jeryl Columbia, MD;  Location: [facility];  Service: Gastroenterology;  Laterality: N/A;  . EUS  11/08/2012   Procedure: UPPER ENDOSCOPIC ULTRASOUND (EUS) LINEAR;  Surgeon: Arta Silence, MD;  Location: [facility];  Service: Endoscopy;  Laterality: N/A;  . FINE  NEEDLE ASPIRATION  11/08/2012   Procedure: FINE NEEDLE ASPIRATION (FNA) LINEAR;  Surgeon: Arta Silence, MD;  Location: [facility];  Service: Endoscopy;  Laterality: N/A;  . HERNIA REPAIR     RIGHT  . LAPAROSCOPY  12/12/2012   Procedure: LAPAROSCOPY DIAGNOSTIC;  Surgeon: Stark Klein, MD;  Location: [facility];  Service: General;  Laterality: N/A;  . Left Foot Surgery    . Left Foot Surgery    . VASECTOMY    . WHIPPLE PROCEDURE  12/12/2012   Procedure: WHIPPLE PROCEDURE;  Surgeon: Stark Klein, MD;  Location: [facility];  Service: General;  Laterality: N/A;   HPI:  [name] is a 57 y.o. male with history of pancreatic endocrine tumor s/p wipple procedure in 2012, DM, HTN admitted for slurred speech, N/V and right sided weakness. Dx with left temporal ICH s/p hematoma evacuation 5/3. Remained intubated and on the vent until 5/5.    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 Pts comprehension is very poor. He will respond to prosody of simple Y/N questions but with less than 25% accuracy (only correct with answering his name). Otherwise pt has inability to follow verbal only commands without max contextual cues. Unable to repeat, can read sentences with visual cues to track words to midline but no comprehension observed. Expressive language is occasionally appropriate with basic social language, but requests are characterized by semantic paraphasias and perseveration and confrontational language is paraphasic/jargon and unintelligible. Pt demonstrates no awareness of errors and pt response to cueing attempts is poor. Pt will need intensive speech therapy, recommend CIR at d/c,  SLP Assessment  SLP Recommendation/Assessment: Patient needs continued Speech Lanaguage Pathology Services SLP Visit Diagnosis: Aphasia (R47.01)    Follow Up Recommendations       Frequency and Duration min 3x week   2 weeks      SLP Evaluation Cognition  Overall Cognitive Status: Impaired/Different from baseline Arousal/Alertness: Lethargic Orientation Level: Oriented to person;Other (comment)(unable to answer orientation questions due to aphasia) Attention: Focused;Sustained Focused Attention: Appears intact Sustained Attention: Impaired Sustained Attention Impairment: Verbal basic;Functional basic Problem Solving: Impaired Problem Solving Impairment: Functional basic       Comprehension  Auditory Comprehension Overall Auditory Comprehension: Impaired Yes/No Questions: Impaired Basic Biographical Questions: 0-25% accurate Basic Immediate Environment Questions: 0-24% accurate Complex Questions: 0-24% accurate Commands: Impaired One Step Basic Commands: 0-24% accurate Conversation: Simple Interfering Components: Visual impairments;Attention Visual Recognition/Discrimination Discrimination: Exceptions to [facility] Common Objects: Unable to indentify Reading Comprehension Reading Status: Impaired Word level: Within functional limits Sentence Level: Impaired Paragraph Level: Not tested Interfering Components: Visual scanning;Right neglect/inattention;Visual acuity Effective Techniques: Tactile cueing;Large print;Eye glasses    Expression Verbal Expression Overall Verbal Expression: Impaired Initiation: No impairment Automatic Speech: Name;Social Response Level of Generative/Spontaneous Verbalization: Phrase Repetition: Impaired Level of Impairment: Word level Naming: Impairment Responsive: Not tested Confrontation: Impaired Common Objects: Unable to indentify Convergent: Not tested Divergent: Not tested Verbal Errors: Jargon;Not aware of errors;Perseveration;Phonemic paraphasias;Semantic paraphasias Effective Techniques: Written cues Written Expression Dominant Hand: Right Written Expression: Not tested   Oral / Motor  Oral Motor/Sensory Function Overall Oral Motor/Sensory  Function: Mild impairment Facial ROM: Reduced right;Suspected CN VII (facial) dysfunction Facial Symmetry: Within Functional Limits Facial Strength: Reduced right;Suspected CN VII (facial) dysfunction Lingual ROM: Reduced right;Reduced left Lingual Symmetry: Within Functional Limits Velum: Within Functional Limits Mandible: Within Functional Limits Motor Speech Overall Motor Speech: Impaired Respiration: Impaired Level of Impairment: Phrase Phonation: Low vocal intensity   GO                    Hector Pugh, Katherene Ponto 04/09/2018, 10:33 AM

## 2018-04-09 NOTE — Progress Notes (Addendum)
STROKE TEAM PROGRESS NOTE   SUBJECTIVE (INTERVAL HISTORY) His son is at the bedside. Now able to take reg diet with thin liquids. Off amio drip, converted to PO. BP remains elevated on cozaar, changed cardene to cleviprex and added amlodipine. Continue 3%.   OBJECTIVE Temp:  [98.3 F (36.8 C)-99.9 F (37.7 C)] 98.8 F (37.1 C) (05/07 0800) Pulse Rate:  [54-114] 102 (05/07 0800) Cardiac Rhythm: Sinus tachycardia;Normal sinus rhythm (05/07 0800) Resp:  [9-39] 22 (05/07 0800) BP: (110-152)/(63-124) 137/77 (05/07 0800) SpO2:  [95 %-100 %] 98 % (05/07 0800)  Recent Labs  Lab 04/08/18 1108 04/08/18 1604 04/08/18 2005 04/08/18 2247 04/09/18 0733  GLUCAP 159* 125* 147* 199* 177*   Recent Labs  Lab 04/06/18 0312  04/07/18 0454  04/07/18 2140  04/08/18 0354 04/08/18 0950 04/08/18 1626 04/08/18 2234 04/09/18 0426  NA 137   < > 146*   < > 147*   < > 148* 151* 151* 149* 149*  K 3.9  --  3.7  --  3.2*  --  3.1*  --   --   --  3.0*  CL 105  --  113*  --  118*  --  118*  --   --   --  117*  CO2 24  --  26  --  24  --  22  --   --   --  23  GLUCOSE 128*  --  138*  --  133*  --  155*  --   --   --  196*  BUN 17  --  20  --  16  --  14  --   --   --  12  CREATININE 0.94  --  0.94  --  0.76  --  0.79  --   --   --  0.83  CALCIUM 7.6*  --  7.9*  --  7.4*  --  7.4*  --   --   --  7.9*  MG  --   --  2.0  --  2.0  --  2.0  --   --   --   --   PHOS  --   --  1.9*  --   --   --   --   --   --   --  1.8*   < > = values in this interval not displayed.   Recent Labs  Lab 04/05/18 0925 04/07/18 0454  AST 23 16  ALT 20 12*  ALKPHOS 44 32*  BILITOT 1.4* 0.8  PROT 6.7 5.5*  ALBUMIN 4.0 3.0*   Recent Labs  Lab 04/05/18 0925  04/05/18 1117 04/05/18 1741 04/06/18 0312 04/07/18 0454 04/09/18 0426  WBC 14.2*  --   --   --  11.1* 7.4 7.1  NEUTROABS 12.0*  --   --   --   --   --   --   HGB 15.2   < > 13.9 13.1 11.3* 11.2* 10.5*  HCT 45.3   < > 41.0 38.6* 34.6* 35.2* 32.8*  MCV 84.7  --    --   --  85.9 89.3 87.0  PLT 159  --   --   --  159 113* 145*   < > = values in this interval not displayed.   Recent Labs  Lab 04/07/18 2140 04/08/18 0343 04/08/18 0950  TROPONINI <0.03 <0.03 <0.03   Recent Labs    04/07/18 0454  LABPROT 17.1*  INR 1.40  Recent Labs    04/06/18 0930  COLORURINE YELLOW  LABSPEC 1.028  PHURINE 5.0  GLUCOSEU NEGATIVE  HGBUR NEGATIVE  BILIRUBINUR NEGATIVE  KETONESUR 5*  PROTEINUR NEGATIVE  NITRITE NEGATIVE  LEUKOCYTESUR TRACE*       Component Value Date/Time   CHOL 99 04/06/2018 0325   TRIG 98 04/06/2018 0325   HDL 34 (L) 04/06/2018 0325   CHOLHDL 2.9 04/06/2018 0325   VLDL 20 04/06/2018 0325   LDLCALC 45 04/06/2018 0325   Lab Results  Component Value Date   HGBA1C 6.8 (H) 04/06/2018      Component Value Date/Time   LABOPIA NONE DETECTED 04/06/2018 0504   COCAINSCRNUR NONE DETECTED 04/06/2018 0504   LABBENZ POSITIVE (A) 04/06/2018 0504   AMPHETMU NONE DETECTED 04/06/2018 0504   THCU NONE DETECTED 04/06/2018 0504   LABBARB NONE DETECTED 04/06/2018 0504    Recent Labs  Lab 04/05/18 [place] <10    Ct Head Code Stroke Wo Contrast 04/05/2018 0944 1. Large hemorrhage left temporal lobe with surrounding edema and mass-effect. Intraventricular hemorrhage with mild hydrocephalus. 13 mm midline shift. Etiologies of hemorrhage are not determined on this study but could be due to hypertension, cerebral amyloid, vascular malformation, or less likely tumor. 2. ASPECTS is 10   Ct Angio Head W Or Wo Contrast 04/05/2018  1112 Stable LEFT temporal lobe hemorrhage, with LEFT-to-RIGHT midline shift. Lobar hypertensive bleed is favored. No underlying vascular anomaly, or arterial/venous thrombosis/embolus.   Ct Head Wo Contrast 04/05/2018 2228 1. Postsurgical changes from interval partial evacuation of hematoma within the left temporal lobe with approximately 20 cc residual. 2. Stable small volume of intraventricular hemorrhage and small  volume of subarachnoid hemorrhage predominantly over left convexity. 3. Decreased mass effect with 6 mm left-to-right midline shift, previously 13 mm. Improved patency of lateral ventricles. 4. No new acute intracranial abnormality identified.   Ct Head Wo Contrast 04/06/2018 0913 1. Similar postoperative appearance of residual blood products in the left temporal lobe following partial evacuation of the hematoma. 2. Stable intraventricular hemorrhage without hydrocephalus. 3. 6 mm left-to-right midline shift is stable.   Ct Head Wo Contrast 04/07/2018 1552 1. Stable to slight decrease in size of residual blood products in the left temporal lobe following partial vacuo a shin. 2. Slight decrease in midline shift. 3. Stable interventricular hemorrhage without hydrocephalus.   Ct Head Wo Contrast 04/09/2018 0438 1. Stable left temporal lobe hemorrhage, 5 mm left-to-right midline shift, small volume subarachnoid hemorrhage, and small volume intraventricular hemorrhage. 2. No new acute intracranial abnormality.  TTE  04/07/2018 - Left ventricle: The cavity size was normal. Wall thickness was normal. Systolic function was normal. The estimated ejection fraction was in the range of 60% to 65%. Wall motion was normal; there were no regional wall motion abnormalities. Left ventricular diastolic function parameters were normal. - Aortic valve: There was mild regurgitation. - Atrial septum: No defect or patent foramen ovale was identified. - Systemic veins: Patient ventilated so unable to accurately assess CVP with IVC assessment.  PHYSICAL EXAM General - Well nourished, well developed, in no apparent distress.  Cardiovascular - Regular rate and rhythm. Respiratory - clear to auscultation  Neuro - awake, alert. Knows self, not place or time. Will mimic (stick out tongue, lick lips). Inconsistently follows simple midline commands. Perseverates. Cannot show 2 fingers nor mimic 2 fingers. Left gaze preference,  did not cross midline, left gaze incomplete. No ptosis noted. Right facial droop. Will tracking buy not cross midline, blinking to  visual threat on the left, but not on the right. LUE and LLE 5/5. RLE 4/5 and RUE 4/5. DTR 1+ and R babinski. Sensation intact bilaterally. coordination and gait not tested.   ASSESSMENT/PLAN Hector Pugh is a 58 y.o. male with history of pancreatic endocrine tumor s/p wipple procedure in 2012, DM, HTN admitted for slurry speech, N/V and right sided weakness. No tPA given due to [place].    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 Decreased hemorrhage and slight decrease midline shift. Stable IVH.no hydro  Repeat CT 04/10/15 stable. Decreased shift to 48mm. Small SAH and IVH.   2D Echo  EF 60-65%. No source of embolus   LDL 45  HgbA1c 6.8  SCDs for VTE prophylaxis  Reg diet thin liquids  No antithrombotic prior to admission  Continue keppra for seizure prevention  Ongoing aggressive stroke risk factor management  Therapy recommendations:  CIR. Consult placed.  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  Amlodipine 10 daily added  Wean  cleviprex as able  AFIB w/ RVR  Hx post Whipple AF  Repeat onset 04/07/2018   Amiodarone drip changed to po  Other Active Problems  Pancreatic endocrine tumor s/p wipple procedure in 2012  Hypomagnesemia, replaced  Hypokalemia, 3.0 replaced  Repeat labs in am  Hospital day # [place], MSN, APRN, ANVP-BC, AGPCNP-BC Advanced Practice Stroke Nurse [place] for Schedule & Pager information 04/09/2018 9:26 AM   ATTENDING NOTE: I reviewed above note and agree with the assessment and plan. I have made any additions or clarifications directly to the above note. Pt was seen and examined.   Pt son at bedside. Pt overnight no acute event. More awake alert and more talkative. Still has receptive aphasia and not following peripheral commands, but able to follow central commands. Still has perseveration, but able to have meaningful sentences. No significant dysarthria. Right hemiparesis continues to improve. Left temporal surgical wound dry and clean. Na 149. Repeat CT showed stable hematoma and increased swelling but decreasing midline shift. Continue 3% saline today and consider to start taper tomorrow. BP mildly high on cardene and will switch to cleviprex. Still has mild to moderate HA and ordered fioricet PRN. Continue PT/OT. No fever, and zosyn discontinued. Amiodarone changed to po. Still has mild tachycardia but no afib seen on tele.  Rosalin Hawking, MD PhD Stroke Neurology 04/09/2018 9:26 AM  This patient is critically ill due to [place] s/p hematoma evacuation, brain edema, intubated and at significant risk of neurological worsening, death form hematoma expansion, cerebral edema, brain herniation. This patient's care requires constant monitoring  of vital signs, hemodynamics, respiratory and cardiac monitoring, review of multiple databases, neurological assessment, discussion with family, other specialists and medical decision making of high complexity. I spent 35  minutes of neurocritical care time in the care of this patient. I had long discussion with son and Dr. Pati Gallo at bedside, updated pt current condition, treatment plan and potential prognosis.      To contact Stroke Continuity provider, please refer to [email or web address] After hours, contact General Neurology

## 2018-04-09 NOTE — Consult Note (Signed)
Physical Medicine and Rehabilitation Consult Reason for Consult: Decreased functional mobility with right-sided weakness and aphasia Referring Physician: Dr.Xu   HPI: Hector Pugh is a 57 y.o. right-handed male with history of diabetes mellitus, hypertension, pancreatic cancer status post biliary stent as well as Whipple procedure 2014.  Per chart review and family, patient lives with spouse.  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  Multilevel home with 2 steps to entry.  Presented 04/05/2018 with right sided weakness and aphasia.  CT of the head reviewed, showing large left temporal hemorrhage.  Per report, large hemorrhage in the left temporal lobe measuring 6.6 x 3.8 cm.  Surrounding hypodense edema with moderate mass-effect.  CT angiogram of the head with no underlying vascular anomaly.  UDS positive benzos.  Underwent left temporal craniotomy for hematoma evacuation 04/05/2018 per Dr. Christella Noa.  Keppra added for seizure prophylaxis.  Cleviprex for blood pressure control.  Currently on mechanical soft diet.  Latest follow-up CT of the head 04/09/2018 stable no acute changes.  Physical therapy evaluation completed with recommendations of physical medicine rehab consult.  Review of Systems  Unable to perform ROS: Mental acuity   Past Medical History:  Diagnosis Date  . 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 Cancer ([place])   . Diabetes mellitus ([place]) 11/10/2012  . Diabetes mellitus without complication (HCC)    diet controlled  . HTN (hypertension) 11/10/2012  . Hypertension   . 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 Pancreatic cancer ([place])   . Pancreatitis 11/10/2012  . Seasonal allergies    Past Surgical History:  Procedure Laterality Date  . circucision    . circumsision    . COLONOSCOPY N/A 04/04/2018   Procedure: COLONOSCOPY;  Surgeon: Leighton Ruff, MD;  Location: [facility];  Service: Endoscopy;  Laterality: N/A;  . CRANIOTOMY Left  04/05/2018   Procedure: Left Temporal CRANIOTOMY HEMATOMA EVACUATION of Intracranial Hemorrhage;  Surgeon: Ashok Pall, MD;  Location: [facility];  Service: Neurosurgery;  Laterality: Left;  . ERCP  11/08/2012   Procedure: ENDOSCOPIC RETROGRADE CHOLANGIOPANCREATOGRAPHY (ERCP);  Surgeon: Jeryl Columbia, MD;  Location: [facility];  Service: Gastroenterology;  Laterality: N/A;  . EUS  11/08/2012   Procedure: UPPER ENDOSCOPIC ULTRASOUND (EUS) LINEAR;  Surgeon: Arta Silence, MD;  [facility];  Service: Endoscopy;  Laterality: N/A;  . FINE NEEDLE ASPIRATION  11/08/2012   Procedure: FINE NEEDLE ASPIRATION (FNA) LINEAR;  Surgeon: Arta Silence, MD;  [facility];  Service: Endoscopy;  Laterality: N/A;  . HERNIA REPAIR     RIGHT  . LAPAROSCOPY  12/12/2012   Procedure: LAPAROSCOPY DIAGNOSTIC;  Surgeon: Stark Klein, MD;  Location: [facility];  Service: General;  Laterality: N/A;  . Left Foot Surgery    . Left Foot Surgery    . VASECTOMY    . WHIPPLE PROCEDURE  12/12/2012   Procedure: WHIPPLE PROCEDURE;  Surgeon: Stark Klein, MD;  [facility]: [facility];  Service: General;  Laterality: N/A;   Family History  Problem Relation Age of Onset  . Cancer Mother        esophageal carcinoma   Social History:  reports that he has never smoked. He has never used smokeless tobacco. He reports that he does not drink alcohol or use drugs. Allergies: No Known Allergies Medications Prior to Admission  Medication Sig Dispense Refill  . acetaminophen (TYLENOL) 500 MG tablet Take 500 mg by mouth every 4 (four) hours as needed for moderate pain  or headache.     Marland Kitchen glipiZIDE (GLUCOTROL XL) 5 MG 24 hr tablet Take 5 mg by mouth daily with breakfast.     . levothyroxine (SYNTHROID, LEVOTHROID) 50 MCG tablet Take 50 mcg by mouth daily before breakfast.    . losartan (COZAAR) 50 MG tablet Take 50 mg by mouth daily.    . tamsulosin (FLOMAX) 0.4 MG CAPS capsule Take 0.4 mg by mouth daily.  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  He has two MS degrees  Functional Status:  Mobility: Bed Mobility Overal bed mobility: Needs Assistance Bed Mobility: Supine to Sit Supine to sit: Mod assist General bed mobility comments: assist to initiate moving LEs off bed and to scoot to EOB  Transfers Overall transfer level: Needs assistance Transfers: Sit to/from Stand, Stand Pivot Transfers Sit to Stand: Min assist, +2 physical assistance, +2 safety/equipment Stand pivot transfers: Mod assist, +2 physical assistance, +2 safety/equipment General transfer comment: Pt requires assist to steady - demonstrates posterior bias when standing.  He requires assist/facilitation to weight shift and to advance Rt LE as well as facilitation to maintain Lt  hip extension when advancing Rt LE  Ambulation/Gait Ambulation/Gait assistance: Max assist, +2 physical assistance, +2 safety/equipment(Max with fatigue after 20 ft) Ambulation Distance (Feet): 40 Feet Assistive device: 2 person hand held assist(wrap around support with gait belt) Gait Pattern/deviations: Step-through pattern, Decreased stride length, Scissoring, Trunk flexed, Leaning posteriorly General Gait Details: patient with noted RLE internal rotation during loading response, manual faciliation of RLE stride required. Patient with some synergistic movement during swing phase limited ability for secone  placement prior to loading response. Bilateral flexion hip/knees noted (increased with fatigue). 2 person wrap around support required Gait velocity: decreased Gait velocity interpretation: <1.31 ft/sec, indicative of household ambulator    ADL: ADL Overall ADL's : Needs assistance/impaired Eating/Feeding: Set up, Supervision/ safety, Sitting Eating/Feeding Details (indicate cue type and reason): Pt primarily uses Lt UE.  Per daughter, in their culture, people always feed themselves with their Rt UE  Grooming: Wash/dry hands, Wash/dry face, Minimal assistance, Sitting Upper Body Bathing: Moderate assistance, Sitting Lower Body Bathing: Maximal assistance, Sit to/from stand Upper Body Dressing : Maximal assistance, Sitting Lower Body Dressing: Maximal assistance, Sit to/from stand Toilet Transfer: +2 for safety/equipment, +2 for physical assistance, Moderate assistance Toilet Transfer Details (indicate cue type and reason): assist for balance and to facillitate weight shift  Toileting- Clothing Manipulation and Hygiene: Maximal assistance, Sit to/from stand Functional mobility during ADLs: Moderate assistance, +2 for physical assistance, +2 for safety/equipment  Cognition: Cognition Overall Cognitive Status: Impaired/Different from baseline Arousal/Alertness: Lethargic Orientation Level: Oriented to person, Other (comment)(unable to answer orientation questions due to aphasia) Attention: Focused, Sustained Focused Attention: Appears intact Sustained Attention: Impaired Sustained Attention Impairment: Verbal basic, Functional basic Problem Solving: Impaired Problem Solving Impairment: Functional basic Cognition Arousal/Alertness: Awake/alert, Lethargic Behavior During Therapy: Flat affect, Impulsive Overall Cognitive Status: Impaired/Different from baseline Area of Impairment: Attention, Awareness, Problem solving Current Attention Level: Sustained, Selective Problem Solving: Slow  processing, Decreased  initiation, Requires verbal cues General Comments: Pt with Rt inattention. mildly impulsive.  Communication deficits make accurate assessment of cognition difficult  Difficult to assess due to: Impaired communication  Blood pressure 135/76, pulse (!) 102, temperature 98.8 F (37.1 C), temperature source Axillary, resp. rate 18, height 5\' 6"  (1.676 m), weight 72.5 kg (159 lb 13.3 oz), SpO2 100 %. Physical Exam  Vitals reviewed. Constitutional: He appears well-developed and well-nourished.  HENT:  Surgical incision with staples  Eyes: Right eye exhibits no discharge. Left eye exhibits no discharge.  Left gaze preference  Neck: Normal range of motion. Neck supple.  Cardiovascular: Regular rhythm.  +Tachycardia  Respiratory: Effort normal and breath sounds normal.  GI: Soft. Bowel sounds are normal.  Musculoskeletal:  RUE edema  Neurological:  Lethargic A&Ox1 Facial weakness Exam limited due to ability to participate, but >/3/5 in LUE and B/l LE LUE: 0/5  Skin:  Incision c/d/i  Psychiatric: His affect is blunt. His speech is delayed and slurred. He is slowed. Cognition and memory are impaired.    Results for orders placed or performed during the hospital encounter of 04/05/18 (from the past 24 hour(s))  Glucose, capillary     Status: Abnormal   Collection Time: 04/08/18 11:08 AM  Result Value Ref Range   Glucose-Capillary 159 (H) 65 - 99 mg/dL  Glucose, capillary     Status: Abnormal   Collection Time: 04/08/18  4:04 PM  Result Value Ref Range   Glucose-Capillary 125 (H) 65 - 99 mg/dL  Sodium     Status: Abnormal   Collection Time: 04/08/18  4:26 PM  Result Value Ref Range   Sodium 151 (H) 135 - 145 mmol/L  Glucose, capillary     Status: Abnormal   Collection Time: 04/08/18  8:05 PM  Result Value Ref Range   Glucose-Capillary 147 (H) 65 - 99 mg/dL  Sodium     Status: Abnormal   Collection Time: 04/08/18 10:34 PM  Result Value Ref Range   Sodium 149  (H) 135 - 145 mmol/L  Glucose, capillary     Status: Abnormal   Collection Time: 04/08/18 10:47 PM  Result Value Ref Range   Glucose-Capillary 199 (H) 65 - 99 mg/dL  CBC     Status: Abnormal   Collection Time: 04/09/18  4:26 AM  Result Value Ref Range   WBC 7.1 4.0 - 10.5 K/uL   RBC 3.77 (L) 4.22 - 5.81 MIL/uL   Hemoglobin 10.5 (L) 13.0 - 17.0 g/dL   HCT 32.8 (L) 39.0 - 52.0 %   MCV 87.0 78.0 - 100.0 fL   MCH 27.9 26.0 - 34.0 pg   MCHC 32.0 30.0 - 36.0 g/dL   RDW 13.3 11.5 - 15.5 %   Platelets 145 (L) 150 - 400 K/uL  Basic metabolic panel     Status: Abnormal   Collection Time: 04/09/18  4:26 AM  Result Value Ref Range   Sodium 149 (H) 135 - 145 mmol/L   Potassium 3.0 (L) 3.5 - 5.1 mmol/L   Chloride 117 (H) 101 - 111 mmol/L   CO2 23 22 - 32 mmol/L   Glucose, Bld 196 (H) 65 - 99 mg/dL   BUN 12 6 - 20 mg/dL   Creatinine, Ser 0.83 0.61 - 1.24 mg/dL   Calcium 7.9 (L) 8.9 - 10.3 mg/dL   GFR calc non Af Amer >60 >60 mL/min   GFR calc Af Amer >60 >60 mL/min   Anion gap 9 5 - 15  Phosphorus  Status: Abnormal   Collection Time: 04/09/18  4:26 AM  Result Value Ref Range   Phosphorus 1.8 (L) 2.5 - 4.6 mg/dL  Glucose, capillary     Status: Abnormal   Collection Time: 04/09/18  7:33 AM  Result Value Ref Range   Glucose-Capillary 177 (H) 65 - 99 mg/dL   Ct Head Wo Contrast  Result Date: 04/09/2018 CLINICAL DATA:  56 y/o M; follow-up of intracranial hemorrhage. History of pancreatic cancer. EXAM: CT HEAD WITHOUT CONTRAST TECHNIQUE: Contiguous axial images were obtained from the base of the skull through the vertex without intravenous contrast. COMPARISON:  04/07/2018 CT head. FINDINGS: Brain: Stable hematoma within the left temporal lobe post partial evacuation. Stable small volume of intraventricular hemorrhage. Stable mass effect with 7 mm of left-to-right midline shift. Stable small volume of subarachnoid hemorrhage over the left convexity in the area of evacuation. Stable small area  of infarction within the left posterosuperior temporal lobe. No new acute intracranial hemorrhage, stroke, or focal mass effect of the brain. No herniation. Vascular: No hyperdense vessel or unexpected calcification. Skull: Stable postsurgical changes related to a left lateral craniotomy with edema in the overlying scalp. Sinuses/Orbits: No acute finding. Other: None. IMPRESSION: 1. Stable left temporal lobe hemorrhage, 5 mm left-to-right midline shift, small volume subarachnoid hemorrhage, and small volume intraventricular hemorrhage. 2. No new acute intracranial abnormality. Electronically Signed   By: Kristine Garbe M.D.   On: 04/09/2018 04:38   Ct Head Wo Contrast  Result Date: 04/07/2018 CLINICAL DATA:  Left temporal hemorrhage. Status post evacuation of hemorrhage 2 days ago. EXAM: CT HEAD WITHOUT CONTRAST TECHNIQUE: Contiguous axial images were obtained from the base of the skull through the vertex without intravenous contrast. COMPARISON:  None. FINDINGS: Brain: Residual blood within the left temporal lobe is again noted. The hyperdense component is stable to slightly decreased compared to the prior exam. Surrounding edema is unchanged. Intraventricular hemorrhage is again noted. There is no hydrocephalus. Midline shift is slightly decreased, now measuring 5 mm just above the foramen of Monro. No new hemorrhage is present. Minimal subarachnoid hemorrhage on the left is stable. The brainstem and cerebellum are normal. Vascular: No hyperdense vessel is present. Skull: Left craniotomy is again noted. Calvarium is otherwise intact. Sinuses/Orbits: The paranasal sinuses and mastoid air cells are clear. The patient is intubated. Globes and orbits are within normal limits. IMPRESSION: 1. Stable to slight decrease in size of residual blood products in the left temporal lobe following partial vacuo a shin. 2. Slight decrease in midline shift. 3. Stable interventricular hemorrhage without hydrocephalus.  These results were called by telephone at the time of interpretation on 04/07/2018 at 2:18 PM to Dr. Carlyon Prows , who verbally acknowledged these results. Electronically Signed   By: San Morelle M.D.   On: 04/07/2018 15:52   Dg Chest Port 1 View  Result Date: 04/09/2018 CLINICAL DATA:  Status post CVA EXAM: PORTABLE CHEST 1 VIEW COMPARISON:  Portable chest x-ray of Apr 07, 2018 FINDINGS: The lungs are mildly hypoinflated since extubation of the trachea. There is linear atelectasis at the left base. The heart and pulmonary vascularity are normal. The PICC line tip projects over the cavoatrial junction. There is no pleural effusion. There is contrast within the splenic flexure of the colon. The observed bony structures are unremarkable. IMPRESSION: Minimal left basilar atelectasis since extubation. No alveolar pneumonia nor CHF. Electronically Signed   By: David  Martinique M.D.   On: 04/09/2018 09:44   Dg Swallowing Func-speech Pathology  Result Date: 04/08/2018 Objective Swallowing Evaluation: Type of Study: MBS-Modified Barium Swallow Study  Patient Details Name: Hector Pugh MRN: [id number] Date of Birth: [birth year] Today's Date: 04/08/2018 Time: SLP Start Time (ACUTE ONLY): 1024 -SLP Stop Time (ACUTE ONLY): 1038 SLP Time Calculation (min) (ACUTE ONLY): 14 min Past Medical History: Past Medical History: Diagnosis Date . 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 Cancer ([place])  . Diabetes mellitus ([place]) 11/10/2012 . Diabetes mellitus without complication (HCC)   diet controlled . HTN (hypertension) 11/10/2012 . Hypertension  . Obstructive jaundice 11/11/2012  With ampullary mass . Pancreatic cancer ([place])  . Pancreatitis 11/10/2012 . Seasonal allergies  Past Surgical History: Past Surgical History: Procedure Laterality Date . circucision   . circumsision   . COLONOSCOPY N/A 04/04/2018  Procedure: COLONOSCOPY;  Surgeon: Leighton Ruff, MD;  [facility];  Service: Endoscopy;  Laterality: N/A;  . ERCP  11/08/2012  Procedure: ENDOSCOPIC RETROGRADE CHOLANGIOPANCREATOGRAPHY (ERCP);  Surgeon: Jeryl Columbia, MD;  Location: [facility];  Service: Gastroenterology;  Laterality: N/A; . EUS  11/08/2012  Procedure: UPPER ENDOSCOPIC ULTRASOUND (EUS) LINEAR;  Surgeon: Arta Silence, MD;  [facility];  Service: Endoscopy;  Laterality: N/A; . FINE NEEDLE ASPIRATION  11/08/2012  Procedure: FINE NEEDLE ASPIRATION (FNA) LINEAR;  Surgeon: Arta Silence, MD;  [facility];  Service: Endoscopy;  Laterality: N/A; . HERNIA REPAIR    RIGHT . LAPAROSCOPY  12/12/2012  Procedure: LAPAROSCOPY DIAGNOSTIC;  Surgeon: Stark Klein, MD;  [facility];  Service: General;  Laterality: N/A; . Left Foot Surgery   . Left Foot Surgery   . VASECTOMY   . WHIPPLE PROCEDURE  12/12/2012  Procedure: WHIPPLE PROCEDURE;  Surgeon: Stark Klein, MD;  [facility];  Service: General;  Laterality: N/A; HPI: Hector Pugh is a 57 y.o. male with history of pancreatic endocrine tumor s/p wipple procedure in 2012, DM, HTN admitted for slurred speech, N/V and right sided weakness. Dx with left temporal ICH s/p hematoma evacuation 5/3. Remained intubated and on the vent until 5/5.  No data recorded Assessment / Plan / Recommendation CHL IP CLINICAL IMPRESSIONS 04/08/2018 Clinical Impression Patient presents with a mild oropharyngeal dysphagia characterized by right sided facial and lingual weakness resulting in mildly prolonged oral transit of bolus, however functional, and mild vallecular residuals which are cleared with subsequent swallow. Full airway protection noted. Important to note intermittent throat clearing with eventual cough despite full airway protection observed.  SLP Visit Diagnosis Dysphagia, oropharyngeal phase (R13.12) Attention and concentration deficit following -- Frontal lobe and executive function deficit following -- Impact on safety and function Mild aspiration risk   CHL IP TREATMENT RECOMMENDATION 04/08/2018  Treatment Recommendations Therapy as outlined in treatment plan below   Prognosis 04/08/2018 Prognosis for Safe Diet Advancement Good Barriers to Reach Goals -- Barriers/Prognosis Comment -- CHL IP DIET RECOMMENDATION 04/08/2018 SLP Diet Recommendations Dysphagia 3 (Mech soft) solids;Thin liquid Liquid Administration via Cup;Straw Medication Administration Whole meds with liquid Compensations Slow rate;Lingual sweep for clearance of pocketing;Small sips/bites Postural Changes Seated upright at 90 degrees   CHL IP OTHER RECOMMENDATIONS 04/08/2018 Recommended Consults -- Oral Care Recommendations Oral care BID Other Recommendations --   CHL IP FOLLOW UP RECOMMENDATIONS 04/08/2018 Follow up Recommendations Inpatient Rehab   CHL IP FREQUENCY AND DURATION 04/08/2018 Speech Therapy Frequency (ACUTE ONLY) min 2x/week Treatment Duration 2 weeks      CHL IP ORAL PHASE 04/08/2018 Oral Phase Impaired Oral - Pudding Teaspoon -- Oral - Pudding Cup -- Oral -  Honey Teaspoon -- Oral - Honey Cup -- Oral - Nectar Teaspoon -- Oral - Nectar Cup -- Oral - Nectar Straw -- Oral - Thin Teaspoon -- Oral - Thin Cup -- Oral - Thin Straw -- Oral - Puree -- Oral - Mech Soft Lingual/palatal residue;Delayed oral transit Oral - Regular -- Oral - Multi-Consistency -- Oral - Pill -- Oral Phase - Comment --  CHL IP PHARYNGEAL PHASE 04/08/2018 Pharyngeal Phase Impaired Pharyngeal- Pudding Teaspoon -- Pharyngeal -- Pharyngeal- Pudding Cup -- Pharyngeal -- Pharyngeal- Honey Teaspoon -- Pharyngeal -- Pharyngeal- Honey Cup -- Pharyngeal -- Pharyngeal- Nectar Teaspoon -- Pharyngeal -- Pharyngeal- Nectar Cup Reduced tongue base retraction;Pharyngeal residue - valleculae Pharyngeal -- Pharyngeal- Nectar Straw -- Pharyngeal -- Pharyngeal- Thin Teaspoon -- Pharyngeal -- Pharyngeal- Thin Cup Reduced tongue base retraction;Pharyngeal residue - valleculae Pharyngeal -- Pharyngeal- Thin Straw Reduced tongue base retraction;Pharyngeal residue - valleculae Pharyngeal --  Pharyngeal- Puree Reduced tongue base retraction;Pharyngeal residue - valleculae Pharyngeal -- Pharyngeal- Mechanical Soft Reduced tongue base retraction;Pharyngeal residue - valleculae Pharyngeal -- Pharyngeal- Regular -- Pharyngeal -- Pharyngeal- Multi-consistency -- Pharyngeal -- Pharyngeal- Pill WFL Pharyngeal -- Pharyngeal Comment --  Gabriel Rainwater MA, CCC-SLP [phone] [place] Leah Meryl 04/08/2018, 11:57 AM               Assessment/Plan: Diagnosis: large hemorrhage in the left temporal lobe Labs and images independently reviewed.  Records reviewed and summated above. Stroke: Continue secondary stroke prophylaxis and Risk Factor Modification listed below:   Blood Pressure Management:  Continue current medication with prn's with permisive HTN per primary team Diabetes management:   Right sided hemiparesis: fit for orthosis to prevent contractures (resting hand splint for day, wrist cock up splint at night, PRAFO, etc) Motor recovery: Fluoxetine  1. Does the need for close, 24 hr/day medical supervision in concert with the patient's rehab needs make it unreasonable for this patient to be served in a less intensive setting? Yes  2. Co-Morbidities requiring supervision/potential complications: diabetes mellitus (Monitor in accordance with exercise and adjust meds as necessary), HTN (monitor and provide prns in accordance with increased physical exertion and pain, wean infusion and IV meds when appropriate), pancreatic cancer status post biliary stent, FUO (cont recs per Neuro, cont abx), dysphagia (advance diet as tolerated), Tachycardia (monitor in accordance with pain and increasing activity), tachypnea (monitor RR and O2 Sats with increased physical exertion), hypothyroidism (cont meds, ensure appropriate mood and energy level for therapies), hypernatremia (cont to monitor, wean 3% Na when appropriate), hypokalemia (continue to monitor and replete as necessary), ABLA (transfuse if necessary to ensure  appropriate perfusion for increased activity tolerance), Thrombocytopenia (< 60,000/mm3 no resistive exercise) 3. Due to bladder management, bowel management, safety, skin/wound care, disease management, medication administration, pain management and patient education, does the patient require 24 hr/day rehab nursing? Yes 4. Does the patient require coordinated care of a physician, rehab nurse, PT (1-2 hrs/day, 5 days/week), OT (1-2 hrs/day, 5 days/week) and SLP (1-2 hrs/day, 5 days/week) to address physical and functional deficits in the context of the above medical diagnosis(es)? Yes Addressing deficits in the following areas: balance, endurance, locomotion, strength, transferring, bowel/bladder control, bathing, dressing, feeding, grooming, toileting, cognition, speech, language, swallowing and psychosocial support 5. Can the patient actively participate in an intensive therapy program of at least 3 hrs of therapy per day at least 5 days per week? In the near future 6. The potential for patient to make measurable gains while on inpatient rehab is excellent 7. Anticipated functional outcomes upon  discharge from inpatient rehab are supervision and min assist  with PT, supervision and min assist with OT, supervision with SLP. 8. Estimated rehab length of stay to reach the above functional goals is: 20-25 days. 9. Anticipated D/C setting: Home 10. Anticipated post D/C treatments: HH therapy and Home excercise program 11. Overall Rehab/Functional Prognosis: good  RECOMMENDATIONS: This patient's condition is appropriate for continued rehabilitative care in the following setting: CIR when medically stable and able to tolerate 3 hours of therapy/day. Patient has agreed to participate in recommended program. Potentially Note that insurance prior authorization may be required for reimbursement for recommended care.  Comment: Rehab Admissions Coordinator to follow up.   I have personally performed a face  to face diagnostic evaluation, including, but not limited to relevant history and physical exam findings, of this patient and developed relevant assessment and plan.  Additionally, I have reviewed and concur with the physician assistant's documentation above.   Delice Lesch, MD, ABPMR Lavon Paganini Angiulli, PA-C 04/09/2018

## 2018-04-09 NOTE — Care Management Note (Signed)
Case Management Note  Patient Details  Name: Hector Pugh MRN: [id number] Date of Birth: 09-23-1961  Subjective/Objective:                 Patient admitted w ICH, s/p crani, per progression rounds anticipate weaning off 3% with goal of CIR admission Friday. CM will continue to follow.    Action/Plan:   Expected Discharge Date:                  Expected Discharge Plan:  [place]  In-House Referral:     Discharge planning Services  CM Consult  Post Acute Care Choice:    Choice offered to:     DME Arranged:    DME Agency:     HH Arranged:    HH Agency:     Status of Service:  In process, will continue to follow  If discussed at Long Length of Stay Meetings, dates discussed:    Additional Comments:  Carles Collet, RN 04/09/2018, 1:14 PM

## 2018-04-09 NOTE — Progress Notes (Signed)
Physical Therapy Treatment Patient Details Name: Hector Pugh MRN: [id number] DOB: 05/29/1961 Today's Date: 04/09/2018    History of Present Illness This 57 y.o. male admitted with c/o HA, vomiting, AMS, and aphasia.  CT of head showed large temporal hemorrhage.  He underwent emergent crani for evacuation.   PMH includes:  pancreatic CA, s/p whipple procedure and subsequent DM.  HTN,    PT Comments    Pt needing consistent cuing for initiation, redirection and working to direct attention to right side.  Emphasis on transition to EOB, sit to stand and gait training with RW    Follow Up Recommendations  CIR     Equipment Recommendations       Recommendations for Other Services Rehab consult     Precautions / Restrictions Precautions Precautions: Fall Precaution Comments: Rt inattention--worsening with fatigue    Mobility  Bed Mobility Overal bed mobility: Needs Assistance Bed Mobility: Supine to Sit     Supine to sit: Mod assist     General bed mobility comments: cues to help initiate coming to EOB.  Truncal assist forward to assist follow through  Transfers Overall transfer level: Needs assistance   Transfers: Sit to/from Stand Sit to Stand: Min assist;+2 safety/equipment         General transfer comment: Max cues to get pt to work on scooting with limited success.  cues and assist to help pt come forward and up.  Ambulation/Gait Ambulation/Gait assistance: Max assist;+2 physical assistance;+2 safety/equipment Ambulation Distance (Feet): 40 Feet(35 feet with RW and assist) Assistive device: Rolling walker (2 wheeled);2 person hand held assist Gait Pattern/deviations: Step-through pattern;Decreased step length - left;Decreased stance time - right;Decreased weight shift to left;Narrow base of support Gait velocity: decreased   General Gait Details: working with pt on subtle w/shift L and controlling adduction during swing through with right hip forward  translation.   Stairs             Wheelchair Mobility    Modified Rankin (Stroke Patients Only) Modified Rankin (Stroke Patients Only) Modified Rankin: Severe disability(to 4/5)     Balance Overall balance assessment: Needs assistance Sitting-balance support: Feet supported Sitting balance-Leahy Scale: Fair       Standing balance-Leahy Scale: Poor Standing balance comment: requires mod A for static standing                             Cognition Arousal/Alertness: Awake/alert Behavior During Therapy: Flat affect Overall Cognitive Status: Impaired/Different from baseline Area of Impairment: Attention;Awareness;Problem solving                   Current Attention Level: Sustained       Awareness: Intellectual Problem Solving: Slow processing;Decreased initiation;Requires verbal cues;Requires tactile cues        Exercises      General Comments        Pertinent Vitals/Pain Faces Pain Scale: No hurt    Home Living                      Prior Function            PT Goals (current goals can now be found in the care plan section) Acute Rehab PT Goals Patient Stated Goal: per daugther, to regain as much independence as possible  PT Goal Formulation: With patient/family Time For Goal Achievement: 04/22/18 Potential to Achieve Goals: Good Progress towards PT goals: Progressing toward goals  Frequency    Min 4X/week      PT Plan Current plan remains appropriate    Co-evaluation              AM-PAC PT "6 Clicks" Daily Activity  Outcome Measure  Difficulty turning over in bed (including adjusting bedclothes, sheets and blankets)?: Unable Difficulty moving from lying on back to sitting on the side of the bed? : Unable Difficulty sitting down on and standing up from a chair with arms (e.g., wheelchair, bedside commode, etc,.)?: Unable Help needed moving to and from a bed to chair (including a wheelchair)?: A  Lot Help needed walking in hospital room?: A Lot Help needed climbing 3-5 steps with a railing? : A Lot 6 Click Score: 9    End of Session   Activity Tolerance: Patient tolerated treatment well Patient left: in bed;with call bell/phone within reach;with nursing/sitter in room Nurse Communication: Mobility status PT Visit Diagnosis: Hemiplegia and hemiparesis;Other symptoms and signs involving the nervous system (R29.898) Hemiplegia - Right/Left: Right Hemiplegia - dominant/non-dominant: Dominant Hemiplegia - caused by: Nontraumatic intracerebral hemorrhage     Time: 7026-3785 PT Time Calculation (min) (ACUTE ONLY): 25 min  Charges:  $Gait Training: 8-22 mins $Therapeutic Activity: 8-22 mins                    G Codes:       04-28-18  Donnella Sham, PT [phone]  (pager)   Hector Pugh 04/28/18, 4:48 PM

## 2018-04-09 NOTE — Progress Notes (Signed)
2230 Na lab resulted - 146. Informed on-call neurologist and was informed to increase 3% saline gtt to 98mL/hr in an attempt to reach the goal of 150-155.  Will complete interventions and continue to monitor closely.  Guadalupe Maple, RN

## 2018-04-10 LAB — BASIC METABOLIC PANEL
Anion gap: 7 (ref 5–15)
BUN: 18 mg/dL (ref 6–20)
CHLORIDE: 116 mmol/L — AB (ref 101–111)
CO2: 22 mmol/L (ref 22–32)
Calcium: 8 mg/dL — ABNORMAL LOW (ref 8.9–10.3)
Creatinine, Ser: 0.8 mg/dL (ref 0.61–1.24)
GFR calc Af Amer: >60 mL/min (ref >60)
GFR calc non Af Amer: >60 mL/min (ref >60)
GLUCOSE: 164 mg/dL — AB (ref 65–99)
POTASSIUM: 3.4 mmol/L — AB (ref 3.5–5.1)
Sodium: 145 mmol/L (ref 135–145)

## 2018-04-10 LAB — CBC
HCT: 33.1 % — ABNORMAL LOW (ref 39.0–52.0)
Hemoglobin: 10.5 g/dL — ABNORMAL LOW (ref 13.0–17.0)
MCH: 27.7 pg (ref 26.0–34.0)
MCHC: 31.7 g/dL (ref 30.0–36.0)
MCV: 87.3 fL (ref 78.0–100.0)
Platelets: 151 10*3/uL (ref 150–400)
RBC: 3.79 MIL/uL — AB (ref 4.22–5.81)
RDW: 13.6 % (ref 11.5–15.5)
WBC: 7.7 10*3/uL (ref 4.0–10.5)

## 2018-04-10 LAB — SODIUM
SODIUM: 145 mmol/L (ref 135–145)
SODIUM: 147 mmol/L — AB (ref 135–145)
Sodium: 142 mmol/L (ref 135–145)

## 2018-04-10 LAB — GLUCOSE, CAPILLARY
GLUCOSE-CAPILLARY: 193 mg/dL — AB (ref 65–99)
GLUCOSE-CAPILLARY: 142 mg/dL — AB (ref 65–99)
Glucose-Capillary: 189 mg/dL — ABNORMAL HIGH (ref 65–99)

## 2018-04-10 LAB — PHOSPHORUS: Phosphorus: 3.1 mg/dL (ref 2.5–4.6)

## 2018-04-10 LAB — TRIGLYCERIDES: Triglycerides: 149 mg/dL (ref <150)

## 2018-04-10 LAB — MAGNESIUM: Magnesium: 1.9 mg/dL (ref 1.7–2.4)

## 2018-04-10 MED ORDER — METOPROLOL TARTRATE 25 MG PO TABS
12.5000 mg | ORAL_TABLET | Freq: Two times a day (BID) | ORAL | Status: DC
  Administered 2018-04-10: 12.5 mg via ORAL
  Filled 2018-04-10: qty 1

## 2018-04-10 MED ORDER — POTASSIUM CHLORIDE CRYS ER 20 MEQ PO TBCR
40.0000 meq | EXTENDED_RELEASE_TABLET | Freq: Once | ORAL | Status: AC
  Administered 2018-04-10: 40 meq via ORAL
  Filled 2018-04-10: qty 2

## 2018-04-10 MED ORDER — CHLORHEXIDINE GLUCONATE 0.12 % MT SOLN
OROMUCOSAL | Status: AC
  Filled 2018-04-10: qty 15

## 2018-04-10 MED ORDER — LABETALOL HCL 5 MG/ML IV SOLN
10.0000 mg | INTRAVENOUS | Status: DC | PRN
  Administered 2018-04-10: 10 mg via INTRAVENOUS
  Filled 2018-04-10: qty 4

## 2018-04-10 NOTE — Progress Notes (Signed)
Inpatient Rehabilitation-Admissions Coordinator   Met with patient, wife, and wife's sister at the bedside to discuss CIR. AC provided family with brochures and contact information for CIR, discussed expected length of stay and anticipated level of function at DC from CIR. AC stressed need for 24/7 A at DC with wife beginning to plan out assistance with anticipated 24 hour coverage. AC will seek authorization from [facility] for approval to admit to CIR and will follow up with pt and family. Noted pt likely medically ready for transfer to floor Thursday; therefore, may DC to rehab Thursday  Jhonnie Garner, OTR/L  Rehab Admissions Coordinator  [phone] 04/10/2018 10:58 AM

## 2018-04-10 NOTE — Progress Notes (Signed)
PULMONARY / CRITICAL CARE MEDICINE  PROGRESS NOTE   Patient Summary:    Name: Hector Pugh MRN: [id number] DOB: 12-17-1960    ADMISSION DATE:  04/05/2018 CHIEF COMPLAINT:  ICH HISTORY OF PRESENT ILLNESS:   57 y/o M who presented to [facility] on 5/3 with reports of headache, vomiting and altered mental status. The patient was in his usual state of health until 5/3 am.  He carries a history of pancreatic cancer s/p whipple (2013 / considered cure) with subsequent development of DM.  Prior to admit, he was functional / working full time.  He had a screening colonoscopy on 5/2 which was reportedly clean.  Family reports that the patient complained of headache on the am of 5/2 which they thought was associated with a lack of caffeine intake while NPO for procedure.  During the procedure, he reportedly received 8 mg versed and 50 mcg of fentanyl.  After the procedure, he was sleepy but appropriate.  The patient woke around 0500 and was appropriate.  His wife went to work as usual.  At some point between 0500 and 0800, he went to the kitchen to make tea and went back up stairs.  His wife called to check on him around 0800 and he did not answer the phone.  She called her daughter, who is an ICU nurse, to check on the patient.  On arrival, the patient was reportedly aphasic and had emesis on his shirt and in the bed.  EMS was activated.  He vomited again with EMS transport.  He presented with 4/5 strength per Neurology but did not make it to the OR with 4/5.  Work up in [place] revealed a large temporal hemorrhage.  The patient was taken emergently to the OR for evacuation.  He was returned to ICU on vent postoperatively.  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 PAST SURGICAL HISTORY: He  has a past surgical history that includes circucision; Hernia repair; Left Foot Surgery; Vasectomy; circumsision; Left Foot Surgery; EUS (11/08/2012); Fine needle aspiration (11/08/2012); ERCP (11/08/2012); laparoscopy (12/12/2012); Whipple procedure (12/12/2012); and Colonoscopy (N/A, 04/04/2018).    New Events  04-08-18 Tolerating extubation well Passed Swallow study PT working with patient Afib controlled with amiodarone Awake, follows commands   04-09-18 No fever, cough, diarrhea. Tolerating po. On 3% saline.  04-10-18 Improving steadily, No chest cx,  remains in sinus rhythm.   ROS- limited by critical condition.   .  stroke: mapping our early stages of recovery book   Does not apply Once  . amiodarone  200 mg Oral Daily  . amLODipine  10 mg Oral Daily  . chlorhexidine      . Chlorhexidine Gluconate Cloth  6 each Topical Daily  . feeding supplement (ENSURE ENLIVE)  237 mL Oral BID BM  . insulin aspart  0-5 Units Subcutaneous QHS  . insulin aspart  0-9 Units Subcutaneous TID WC  . levETIRAcetam  500 mg Oral BID  . levothyroxine  50 mcg Oral QAC breakfast  . losartan  50 mg Oral BID  . mouth rinse  15 mL Mouth Rinse BID  . pantoprazole  40 mg Oral Daily  . pneumococcal 23 valent vaccine  0.5 mL Intramuscular Tomorrow-1000  . sennosides  5 mL Per Tube QHS  . sodium chloride flush  10-40 mL Intracatheter Q12H  . tamsulosin  0.4 mg Oral Daily     PHYSICAL EXAM:    Awake, alert, follows commands  Today's Vitals   04/10/18 0630 04/10/18 0700 04/10/18 0800 04/10/18 0859  BP: 136/76 133/82    Pulse: (!) 103 99    Resp: 12 (!) 22    Temp:   98.1 F (36.7 C)   TempSrc:   Axillary   SpO2: 100% 100%    Weight:      Height:      PainSc:   5  0-No pain   Eyes: Pupils equal,No pallor  Neck: No JVD  CVS:  -s1 s2 ; no murmur  Resp:Breath sounds equal; no rales  Abdomen:abd-soft, non tender,     BS+  Extremities:No edema LUE picc site-no erythema  Neuro:  Mild weakness  on right arm/leg  Skin: no rash    LABS:    CBC Latest Ref Rng & Units 04/10/2018 04/09/2018 04/07/2018  WBC 4.0 - 10.5 K/uL 7.7 7.1 7.4  Hemoglobin 13.0 - 17.0 g/dL 10.5(L) 10.5(L) 11.2(L)  Hematocrit 39.0 - 52.0 % 33.1(L) 32.8(L) 35.2(L)  Platelets 150 - 400 K/uL 151 145(L) 113(L)    BMP Latest Ref Rng & Units 04/10/2018 04/09/2018 04/09/2018  Glucose 65 - 99 mg/dL 164(H) - -  BUN 6 - 20 mg/dL 18 - -  Creatinine 0.61 - 1.24 mg/dL 0.80 - -  Sodium 135 - 145 mmol/L 145 146(H) 157(H)  Potassium 3.5 - 5.1 mmol/L 3.4(L) - -  Chloride 101 - 111 mmol/L 116(H) - -  CO2 22 - 32 mmol/L 22 - -  Calcium 8.9 - 10.3 mg/dL 8.0(L) - -     Phos- 3, Mg- 1.9  Sputum 04-06-18      Gram Stain ABUNDANT WBC PRESENT, PREDOMINANTLY PMN  NO SQUAMOUS EPITHELIAL CELLS SEEN  ABUNDANT GRAM POSITIVE COCCI IN PAIRS  ABUNDANT GRAM NEGATIVE RODS  RARE GRAM POSITIVE RODS     Culture Consistent with normal respiratory flora.         CT brain 04-07-18 Brain: Residual blood within the left temporal lobe is again noted. The hyperdense component is stable to slightly decreased compared to the prior exam. Surrounding edema is unchanged. Intraventricular hemorrhage is again noted. There is no hydrocephalus. Midline shift is slightly decreased, now measuring 5 mm just above the foramen of [place]. No new hemorrhage is present. Minimal subarachnoid hemorrhage on the left is stable. The brainstem and cerebellum are normal. Vascular: No hyperdense vessel is present. Skull: Left craniotomy is again noted. Calvarium is otherwise intact. Sinuses/Orbits: The paranasal sinuses and mastoid air cells are clear. The patient is intubated. Globes and orbits are within normal Limits.  Ct brain 04-09-18 1. Stable left temporal lobe hemorrhage, 5 mm left-to-right midline shift, small volume subarachnoid hemorrhage, and small  volume intraventricular hemorrhage. 2. No new acute intracranial abnormality.  CHEST X-RAY:    04-09-18 Minimal left basilar atelectasis since extubation. No alveolar pneumonia nor CHF.  Ekg-  04-07-18 sinus- no acute changes 04-07-18- A fib, RVR     Echo- 04-07-18  - Left ventricle: The cavity size was normal. Wall thickness was   normal. Systolic function was normal. The estimated ejection   fraction was in the range of 60% to 65%. Wall motion was normal;   there were no regional wall motion abnormalities. Left   ventricular diastolic function parameters were normal. - Aortic valve: There was mild regurgitation. - Atrial septum: No defect or patent foramen  ovale was identified. - Systemic veins: Patient ventilated so unable to accurately assess   CVP with IVC assessment   Assessment- Plan:  57 y/o M admitted with large left ICH status post evacuation on 04/05/2018.   PULMONARY S/p extubation  No acute process  CARDIOVASCULAR Hypertension Goal systolic blood pressure [phone] Amlodipine, losaartan, clevidipine  Afib >sinus; amiodarone changed to po Consider Cardiology input eventually. 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 Awaiting inpatient rehab.   ICu care today    Thank you for letting me participate in the care of your patient.  ^^^^^^^^^^ I  Have  personally spent  50  Minutes  In the care of this Patient providing Critical care Services; Time includes review of chart, labs, imaging, coordinating care with other physicians and healthcare team members. Also includes time for frequent reevaluation and additional treatment implementation due to change in clinical condiiton of patient. Excludes time spent for Procedure and Teaching.   ^^^^^^^^^^  Note subject to typographical and grammatical errors;   Any formal questions or concerns about the content, text, or information contained within the body of this dictation should be directly addressed to the physician  for  clarification.   Evans Lance, MD Pulmonary and [facility] Pager: [phone]

## 2018-04-10 NOTE — PMR Pre-admission (Signed)
PMR Admission Coordinator Pre-Admission Assessment  Patient: Hector Pugh is an 57 y.o., male MRN: [id number] DOB: Sep 11, 1961 Height: 5\' 6"  (167.6 cm) Weight: 72.5 kg (159 lb 13.3 oz)              Insurance Information HMO:     PPO: YES     PCP:      IPA:      80/20:      OTHER: Open Access Plus with [employer].  PRIMARYChristella Scheuermann      Policy#: [id number]      Subscriber: Patient CM Name: Butch Penny      Phone#: [phone] [id number]    Fax#: [phone] and has Epic access Pre-Cert#: [id number]      Employer:  Benefits:  Phone #: [phone]     Name: Rudie Meyer. Date: 12/04/17     Deduct: $500      Out of Pocket Max: $2,500 including Deduct      Life Max: None CIR: 90%/10%      SNF: 90%/10% with 120 day limit/calendar year Outpatient: 90%     Co-Pay: 10% with 12 visits/PT and OT comb, no ST limits Home Health: 90% with 100 visit limit per calendar year     Co-Pay: 10% DME: 90%     Co-Pay: 10% Providers: In network SECONDARY: None        Medicaid Application Date:       Case Manager:  Disability Application Date:       Case Worker:   Emergency Facilities manager Information    Name Relation Home Work Butler Spouse [phone]  [phone]   Bies,Puja Daughter [phone]  [phone]     Current Medical History  Patient Admitting Diagnosis: Large Hemorrhage in the Left Temporal Lobe History of Present Illness: HPI:  Hector Pugh is a 57 year old male with history of pancreatic cancer s/p [place] with onset of DM, HTN who was admitted on 04/05/18 after found with decreased LOC, vomitus on the bed, difficulty speaking and decreased right sided movement--last seen normal few hours earlier. CT head done revealing large temporal lobe hemorrhage with surrounding edema, mass effect, IVH and mild hydrocephalus with 23 mm midline shift.  CTA head showed large temporal lobe hemorrhage without occlusion, embolus, AVM or aneurysm--bleed felt to be  lobar hemorrhage.   He was taken to OR emergently for left craniotomy and evacuation of hemorrhage by Dr. Christella Noa. Post op placed on Keppra for seizure prophylaxis and started on IV Zosyn due to concerns of aspiration PNA and completed treatment on 5/7.  Hospital course significant for cerebral edema requiring hypertonic saline, coagulopathy with drop in platelets to 113 and INR 1.4, A fib with RVR and labile BP. He was weaned off cleviprex by 5/8 and hypertonic saline by 5/9.  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  He did develop lethargy later that day and hypertonic saline as well as Cleviprex resumed. Dr. Christella Noa evaluated films and felt that no significant changes noted and no need to check further CT as patient neurologically stable. Pt was off Cleviprex and 3% saline drips on 04/14/18. Pt is to be admitted to CIR today on 04/15/18.   Total: 5 NIHSS    Past Medical History  Past Medical History:  Diagnosis Date  .  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 Cancer ([place])   . Diabetes mellitus ([place]) 11/10/2012  . Diabetes mellitus without complication ([facility])    diet controlled  . HTN (hypertension) 11/10/2012  . Hypertension   . 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 Pancreatic cancer ([place])   . Pancreatitis 11/10/2012  . Seasonal allergies     Family History  family history includes Cancer in his mother.  Prior Rehab/Hospitalizations:  Has the patient had major surgery during 100 days prior to admission? Yes; Colonoscopy on May 2nd 2019.   Current Medications   Current Facility-Administered Medications:  .   stroke: mapping our early stages of recovery book, , Does not apply, Once, Ashok Pall, MD .  acetaminophen  (TYLENOL) tablet 650 mg, 650 mg, Oral, Q4H PRN, 650 mg at 04/15/18 0747 **OR** [DISCONTINUED] acetaminophen (TYLENOL) solution 650 mg, 650 mg, Per Tube, Q4H PRN **OR** [DISCONTINUED] acetaminophen (TYLENOL) suppository 650 mg, 650 mg, Rectal, Q4H PRN, Ashok Pall, MD, 650 mg at 04/06/18 1612 .  amiodarone (PACERONE) tablet 200 mg, 200 mg, Oral, Daily, Sivakumar, Siva P, MD, 200 mg at 04/15/18 0938 .  amLODipine (NORVASC) tablet 10 mg, 10 mg, Oral, Daily, Rosalin Hawking, MD, 10 mg at 04/15/18 0938 .  butalbital-acetaminophen-caffeine (FIORICET, ESGIC) 50-325-40 MG per tablet 1 tablet, 1 tablet, Oral, Q8H PRN, Rosalin Hawking, MD, 1 tablet at 04/15/18 0156 .  feeding supplement (ENSURE ENLIVE) (ENSURE ENLIVE) liquid 237 mL, 237 mL, Oral, BID BM, Rosalin Hawking, MD, 237 mL at 04/13/18 1303 .  insulin aspart (novoLOG) injection 0-5 Units, 0-5 Units, Subcutaneous, QHS, Rosalin Hawking, MD, 2 Units at 04/13/18 2252 .  insulin aspart (novoLOG) injection 0-9 Units, 0-9 Units, Subcutaneous, TID WC, Rosalin Hawking, MD, 1 Units at 04/15/18 [phone] .  labetalol (NORMODYNE,TRANDATE) injection 10-20 mg, 10-20 mg, Intravenous, Q10 min PRN, Burnetta Sabin L, NP, 20 mg at 04/14/18 1837 .  levETIRAcetam (KEPPRA) tablet 500 mg, 500 mg, Oral, BID, Rosalin Hawking, MD, 500 mg at 04/15/18 5361 .  levothyroxine (SYNTHROID, LEVOTHROID) tablet 50 mcg, 50 mcg, Oral, QAC breakfast, Rosalin Hawking, MD, 50 mcg at 04/15/18 0747 .  losartan (COZAAR) tablet 50 mg, 50 mg, Oral, BID, Rosalin Hawking, MD, 50 mg at 04/15/18 4431 .  metoprolol tartrate (LOPRESSOR) injection 5 mg, 5 mg, Intravenous, Q6H PRN, Thomas, Tijo, DO, 5 mg at 04/12/18 1619 .  metoprolol tartrate (LOPRESSOR) tablet 100 mg, 100 mg, Oral, BID, Arnaldo Natal, MD, 100 mg at 04/15/18 0938 .  pantoprazole (PROTONIX) EC tablet 40 mg, 40 mg, Oral, Daily, Rosalin Hawking, MD, 40 mg at 04/15/18 0938 .  pneumococcal 23 valent vaccine (PNU-IMMUNE) injection 0.5 mL, 0.5 mL, Intramuscular, Tomorrow-1000, Rosalin Hawking, MD .  senna [facility]) tablet 8.6 mg, 1 tablet, Oral, Daily, Hayden Pedro M, NP, 8.6 mg at 04/14/18 2152 .  tamsulosin (FLOMAX) capsule 0.4 mg, 0.4 mg, Oral, Daily, Rosalin Hawking, MD, 0.4 mg at 04/15/18 5400  Patients Current Diet:  Diet Order           DIET DYS 3 Room service appropriate? Yes; Fluid consistency: Thin  Diet effective now          Precautions / Restrictions Precautions Precautions: Fall Precaution Comments: still inattentive to the right side,  worse with fatigue, but improving Restrictions Weight Bearing Restrictions: No   Has the patient had 2 or more falls or a fall with injury in the past year?No  Prior Activity Level    Home  Assistive Devices / Equipment Home Equipment: None  Prior Device Use: Indicate devices/aids used by the patient prior to current illness, exacerbation or injury? 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  He has two MS degrees   Self Care: Did the patient need help bathing, dressing, using the toilet or eating?  Independent  Indoor Mobility: Did the patient need assistance with walking from room to room (with or without device)? Independent  Stairs: Did the patient need assistance with internal or external stairs (with or without device)? Independent  Functional Cognition: Did the patient need help planning regular tasks such as shopping or remembering to take medications? Independent  Current Functional Level Cognition  Arousal/Alertness: Lethargic Overall Cognitive Status: Difficult to assess Difficult to assess due to: Impaired communication Current Attention Level: Selective, Sustained Orientation Level: Oriented to person, Oriented to place, Oriented to time Following Commands: Follows one step commands with increased time, Follows one step commands inconsistently General Comments: Continue to present with Rt inattention. mildly  impulsive.  Communication deficits make accurate assessment of cognition difficult. Pt requiring increased cues and time throughout session. During toileting, pt requirint direct hand over hand to facilitate toilet hygiene due to poor following of verbal cues. Attention: Focused, Sustained Focused Attention: Appears intact Sustained Attention: Impaired Sustained Attention Impairment: Verbal basic, Functional basic Problem Solving: Impaired Problem Solving Impairment: Functional basic    Extremity Assessment (includes Sensation/Coordination)  Upper Extremity Assessment: RUE deficits/detail RUE Deficits / Details: Pt able to flex shoulder to ~120*, full finger flex/ext.   Pt with Rt inattention, and fails to incorporate Rt UE into activity consistently.  He will at times, spontaneously attempt to use it for self feeding ` RUE Sensation: decreased proprioception RUE Coordination: decreased fine motor, decreased gross motor  Lower Extremity Assessment: RLE deficits/detail RLE Deficits / Details: patient with active movement against gravity gross motions, responds to multi modal cues for assessment. grossly 3+/5. Able to take on weight through functional standing but noted synergistic compensation at times with movement. RLE Sensation: decreased proprioception RLE Coordination: decreased fine motor, decreased gross motor    ADLs  Overall ADL's : Needs assistance/impaired Eating/Feeding: Minimal assistance, Sitting Eating/Feeding Details (indicate cue type and reason): Pt tends to use Lt UE for all tasks.  Mitt placed over Lt hand to encourage Rt hand use.  Pt able to feed self finger foods and french fries (30% of meal) with min A - required assist to pick up items.  He fatigued with activity  Grooming: Wash/dry hands, Minimal assistance, Sitting Grooming Details (indicate cue type and reason): Providing hand sanitizer while seated in recliner. Pt stating "thank you" and began rubbing his hands  together. Upper Body Bathing: Moderate assistance, Sitting Lower Body Bathing: Maximal assistance, Sit to/from stand Upper Body Dressing : Moderate assistance, Sitting Upper Body Dressing Details (indicate cue type and reason): Mod A to doff/don gown which was soiled Lower Body Dressing: Maximal assistance, Sit to/from stand Toilet Transfer: Moderate assistance, +2 for safety/equipment, Ambulation, Comfort height toilet, RW Toilet Transfer Details (indicate cue type and reason): Mod A for safe descent to toilet. Toileting- Clothing Manipulation and Hygiene: Maximal assistance, Sit to/from stand, +2 for safety/equipment Toileting - Clothing Manipulation Details (indicate cue type and reason): Pt with poor following of VCs to perform toilet hygiene after BM. Pt requiring hand over hand to facilitate toilet hygiene. Pt continues to requiring Max A to complete task.  Functional mobility  during ADLs: Moderate assistance, Minimal assistance, +2 for physical assistance, +2 for safety/equipment General ADL Comments: Pt having BM in bed upoin arrival. Pt requiring Max A for toilet hygiene. When asking pt to complete toilet hygiene, pt washing his face. Requiring hand over hand to complete toilet hygiene.     Mobility  Overal bed mobility: Needs Assistance Bed Mobility: Supine to Sit Supine to sit: Min assist General bed mobility comments: minimal assist to help direct to EOB, more for awareness than need for assist    Transfers  Overall transfer level: Needs assistance Transfers: Sit to/from Stand Sit to Stand: Min assist, +2 safety/equipment Stand pivot transfers: Min assist, +2 physical assistance, +2 safety/equipment General transfer comment: cues and assist for pt to come forward more before powering up.    Ambulation / Gait / Stairs / Wheelchair Mobility  Ambulation/Gait Ambulation/Gait assistance: Min assist, +2 physical assistance Ambulation Distance (Feet): 140 Feet Assistive device:  None, 1 person hand held assist, 2 person hand held assist Gait Pattern/deviations: Step-through pattern General Gait Details: pt noticeably more steady, but with mild hip instability and right hip drop which degraded with fatigue.  Pt also needed general balance and upper trunk flexion control. Gait velocity: decreased Gait velocity interpretation: <1.31 ft/sec, indicative of household ambulator    Posture / Balance Dynamic Sitting Balance Sitting balance - Comments: static sitting with close min guard assist  Balance Overall balance assessment: Needs assistance Sitting-balance support: Feet supported Sitting balance-Leahy Scale: Fair Sitting balance - Comments: static sitting with close min guard assist  Standing balance support: Single extremity supported Standing balance-Leahy Scale: Poor Standing balance comment: Pt loses balance to the Lt and posteriorly - up to mod A to correct     Special needs/care consideration BiPAP/CPAP: no CPM: no Continuous Drip IV: No  Dialysis: No        Days: NA Life Vesta: No  Oxygen: No Special Bed: No Trach Size: No Wound Vac (area): No      Location: NA Skin: surgical incision                               Location: L temporal skull area Bowel mgmt: continent; last BM 04/14/18 Bladder mgmt: continent, use of urinal from EOB or chair  Diabetic mgmt: yes, use of oral pills for management (checks 1x/morning)      Previous Home Environment Living Arrangements: Spouse/significant other  Lives With: Spouse Available Help at Discharge: Family, Available 24 hours/day Type of Home: House Home Layout: Multi-level, Able to live on main level with bedroom/bathroom Home Access: Stairs to enter Entrance Stairs-Rails: None Entrance Stairs-Number of Steps: 2 Bathroom Shower/Tub: Multimedia programmer: Standard Home Care Services: No  Discharge Living Setting Does the patient have any problems obtaining your medications?:  No  Social/Family/Support Systems Patient Roles: Spouse, Parent Contact Information: wife Anticipated Caregiver: Wife: [place]; Home: [phone]; Cell: [phone] Anticipated Caregiver's Contact Information: for wife see above; Freddi Schrager is daugther: [phone]; cell: [phone] Ability/Limitations of Caregiver: NA(per wife, they plan to use family for the 24/7 support) Caregiver Availability: Other (Comment)(family (wife + daughter+family) to cover 24/7) Discharge Plan Discussed with Primary Caregiver: Yes Is Caregiver In Agreement with Plan?: Yes Does Caregiver/Family have Issues with Lodging/Transportation while Pt is in Rehab?: No   Goals/Additional Needs Patient/Family Goal for Rehab: Supervision/Min A for OT/PT; Supervision for SLP Expected length of stay: 20-25 days  Cultural Considerations: Per daugther, people feed  with their RUE, which is pt's affected side at this time; Pt likes to listen to religious music on tablet; religion is big part of his life. Pt's native tongue is [place] but speaks [place]. Since admission, pt responds better to direct commands in his native tongue but will respond back in [place].  Dietary Needs: Dys 3 diet; thin fluids. Pt is diabetic diet vegetarian (per chart order, dietitian to order nutrition supplments as needed Equipment Needs: TBD at next level of care Pt/Family Agrees to Admission and willing to participate: Yes Program Orientation Provided & Reviewed with Pt/Caregiver Including Roles  & Responsibilities: Yes(Reviewed with wife and wife's sister)  Barriers to Discharge: Home environment access/layout  Barriers to Discharge Comments: 2 steps to enter home; able to live on first floor   Decrease burden of Care through IP rehab admission: NA   Possible need for SNF placement upon discharge: not anticipated   Patient Condition: This patient's medical and functional status has changed since the consult dated: 04/09/18 in which the  Rehabilitation Physician determined and documented that the patient's condition is appropriate for intensive rehabilitative care in an inpatient rehabilitation facility. See "History of Present Illness" (above) for medical update. Functional changes are: Min A+2 for transfers and ambulation. Patient's medical and functional status update has been discussed with the Rehabilitation physician and patient remains appropriate for inpatient rehabilitation. Will admit to inpatient rehab today.  Preadmission Screen Completed By:  Jhonnie Garner, 04/15/2018 11:04 AM ______________________________________________________________________   Discussed status with Dr. Posey Pronto on 04/15/18 at 11:12AM and received telephone approval for admission today.  Admission Coordinator:  Jhonnie Garner, time 11:12AM/Date 04/15/18

## 2018-04-10 NOTE — Plan of Care (Signed)
Pt participated in self-care during bath - brushed teeth, washed face. Pt progressing in ability to express needs accurately.

## 2018-04-10 NOTE — Progress Notes (Addendum)
STROKE TEAM PROGRESS NOTE   SUBJECTIVE (INTERVAL HISTORY) His wife is at the bedside. Patient up in chair and more talkative / interactive today. Remains on 3% drip, will begin to wean today. BP has been stable on cleviprex, will increase goal today.  Wife feels his language is appropriate in their language.   OBJECTIVE Temp:  [97.9 F (36.6 C)-99.6 F (37.6 C)] 98.4 F (36.9 C) (05/08 0400) Pulse Rate:  [86-136] 99 (05/08 0700) Cardiac Rhythm: Normal sinus rhythm (05/07 2000) Resp:  [8-27] 22 (05/08 0700) BP: (124-155)/(71-94) 133/82 (05/08 0700) SpO2:  [93 %-100 %] 100 % (05/08 0700)  Recent Labs  Lab 04/09/18 0733 04/09/18 1152 04/09/18 1615 04/09/18 2124 04/10/18 0805  GLUCAP 177* 185* 151* 132* 193*   Recent Labs  Lab 04/07/18 0454  04/07/18 2140  04/08/18 0354  04/09/18 0426 04/09/18 1036 04/09/18 1606 04/09/18 2250 04/10/18 0430  NA 146*   < > 147*   < > 148*   < > 149* 156* 157* 146* 145  K 3.7  --  3.2*  --  3.1*  --  3.0*  --   --   --  3.4*  CL 113*  --  118*  --  118*  --  117*  --   --   --  116*  CO2 26  --  24  --  22  --  23  --   --   --  22  GLUCOSE 138*  --  133*  --  155*  --  196*  --   --   --  164*  BUN 20  --  16  --  14  --  12  --   --   --  18  CREATININE 0.94  --  0.76  --  0.79  --  0.83  --   --   --  0.80  CALCIUM 7.9*  --  7.4*  --  7.4*  --  7.9*  --   --   --  8.0*  MG 2.0  --  2.0  --  2.0  --   --   --   --   --  1.9  PHOS 1.9*  --   --   --   --   --  1.8*  --   --   --  3.1   < > = values in this interval not displayed.   Recent Labs  Lab 04/05/18 0925 04/07/18 0454  AST 23 16  ALT 20 12*  ALKPHOS 44 32*  BILITOT 1.4* 0.8  PROT 6.7 5.5*  ALBUMIN 4.0 3.0*   Recent Labs  Lab 04/05/18 0925  04/05/18 1741 04/06/18 0312 04/07/18 0454 04/09/18 0426 04/10/18 0430  WBC 14.2*  --   --  11.1* 7.4 7.1 7.7  NEUTROABS 12.0*  --   --   --   --   --   --   HGB 15.2   < > 13.1 11.3* 11.2* 10.5* 10.5*  HCT 45.3   < > 38.6*  34.6* 35.2* 32.8* 33.1*  MCV 84.7  --   --  85.9 89.3 87.0 87.3  PLT 159  --   --  159 113* 145* 151   < > = values in this interval not displayed.   Recent Labs  Lab 04/07/18 2140 04/08/18 0343 04/08/18 0950  TROPONINI <0.03 <0.03 <0.03       Component Value Date/Time   CHOL 99 04/06/2018 0325  TRIG 149 04/10/2018 0430   HDL 34 (L) 04/06/2018 0325   CHOLHDL 2.9 04/06/2018 0325   VLDL 20 04/06/2018 0325   LDLCALC 45 04/06/2018 0325   Lab Results  Component Value Date   HGBA1C 6.8 (H) 04/06/2018      Component Value Date/Time   LABOPIA NONE DETECTED 04/06/2018 0504   COCAINSCRNUR NONE DETECTED 04/06/2018 0504   LABBENZ POSITIVE (A) 04/06/2018 0504   AMPHETMU NONE DETECTED 04/06/2018 0504   THCU NONE DETECTED 04/06/2018 0504   LABBARB NONE DETECTED 04/06/2018 0504    Recent Labs  Lab 04/05/18 [place] <10    Ct Head Code Stroke Wo Contrast 04/05/2018 0944 1. Large hemorrhage left temporal lobe with surrounding edema and mass-effect. Intraventricular hemorrhage with mild hydrocephalus. 13 mm midline shift. Etiologies of hemorrhage are not determined on this study but could be due to hypertension, cerebral amyloid, vascular malformation, or less likely tumor. 2. ASPECTS is 10   Ct Angio Head W Or Wo Contrast 04/05/2018  1112 Stable LEFT temporal lobe hemorrhage, with LEFT-to-RIGHT midline shift. Lobar hypertensive bleed is favored. No underlying vascular anomaly, or arterial/venous thrombosis/embolus.   Ct Head Wo Contrast 04/05/2018 2228 1. Postsurgical changes from interval partial evacuation of hematoma within the left temporal lobe with approximately 20 cc residual. 2. Stable small volume of intraventricular hemorrhage and small volume of subarachnoid hemorrhage predominantly over left convexity. 3. Decreased mass effect with 6 mm left-to-right midline shift, previously 13 mm. Improved patency of lateral ventricles. 4. No new acute intracranial abnormality identified.    Ct Head Wo Contrast 04/06/2018 0913 1. Similar postoperative appearance of residual blood products in the left temporal lobe following partial evacuation of the hematoma. 2. Stable intraventricular hemorrhage without hydrocephalus. 3. 6 mm left-to-right midline shift is stable.   Ct Head Wo Contrast 04/07/2018 1552 1. Stable to slight decrease in size of residual blood products in the left temporal lobe following partial vacuo a shin. 2. Slight decrease in midline shift. 3. Stable interventricular hemorrhage without hydrocephalus.   Ct Head Wo Contrast 04/09/2018 0438 1. Stable left temporal lobe hemorrhage, 5 mm left-to-right midline shift, small volume subarachnoid hemorrhage, and small volume intraventricular hemorrhage. 2. No new acute intracranial abnormality.  TTE  04/07/2018 - Left ventricle: The cavity size was normal. Wall thickness was normal. Systolic function was normal. The estimated ejection fraction was in the range of 60% to 65%. Wall motion was normal; there were no regional wall motion abnormalities. Left ventricular diastolic function parameters were normal. - Aortic valve: There was mild regurgitation. - Atrial septum: No defect or patent foramen ovale was identified. - Systemic veins: Patient ventilated so unable to accurately assess CVP with IVC assessment.  PHYSICAL EXAM General - Well nourished, well developed, in no apparent distress. Cardiovascular - Regular rate and rhythm. Respiratory - clear to auscultation  Skin - L scalp staples CDI, no sign edema Neuro - awake, alert. Knows self, not place or time.  Will mimic (stick out tongue, lick lips). Inconsistently follows simple midline commands. Perseverates. Cannot show 2 fingers nor mimic 2 fingers. Cannot name objects or repeat. Social speech intact and can communicate basic needs. Left gaze preference, did not cross midline, left gaze incomplete. No ptosis noted. Right facial droop. Will track but not cross midline,  blinking to visual threat on the left, but not on the right. LUE and LLE 5/5. RLE 4/5 and RUE 4+/5 proximal 4/5 distal. DTR 1+ and R babinski. Sensation intact bilaterally. coordination difficult  to test due to aphaisa and gait not tested.   ASSESSMENT/PLAN Mr. Camaron Cammack is a 57 y.o. male with history of pancreatic endocrine tumor s/p wipple procedure in 2012, DM, HTN admitted for slurry speech, N/V and right sided weakness. No tPA given due to [place].    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 Decreased hemorrhage and slight decrease midline shift. Stable IVH.no hydro  Repeat CT 04/10/15 stable. Decreased shift to 71mm. Small SAH and IVH.   Plan repeat MRI with and without contrast as well as a MRA in 1 month to look for source of hemorrhage as blood resolves  2D Echo  EF 60-65%. No source of embolus   LDL 45  HgbA1c 6.8  SCDs for VTE prophylaxis  Reg diet thin liquids  No antithrombotic prior to admission  Continue keppra for seizure prevention  Ongoing aggressive stroke risk factor management  Therapy recommendations:  CIR.   Disposition:  Pending   Anticipate transfer to the floor tomorrow. Hope for transfer to [facility] Friday.  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  Amlodipine 10 daily added  AFIB w/ RVR  Hx post Whipple AF  Repeat onset 04/07/2018   Amiodarone drip changed to po  Other Active Problems  Pancreatic endocrine tumor s/p wipple procedure in 2012  Hypomagnesemia, resolved after replaced1.9  Hypokalemia, 3.4 replaced  Hospital day # [place], MSN, APRN, ANVP-BC, AGPCNP-BC Advanced Practice Stroke Nurse [place] for Schedule & Pager information 04/10/2018 8:32 AM   ATTENDING NOTE: I reviewed above note and agree with the assessment and plan. I have made any additions or clarifications directly to the above note. Pt was seen and examined.   Pt wife and sister are at bedside. Pt sitting in chair comfortably, overnight no acute event. Awake alert and talkative. Able to follow commands but need promp. Still has perseveration, but able to speak more sentences. Mild dysarthria. Left gaze preference improved and able to cross midline. Right hemiparesis continues to improve. Left temporal surgical wound dry and clean. Tape 3% saline today and will be off tomorrow. BP stable on BP meds. Will repeat CT head in am.  Rosalin Hawking, MD PhD Stroke Neurology  04/09/2018 9:26 AM  This patient is critically ill due to [place] s/p hematoma evacuation, brain edema, intubated and at significant risk of neurological worsening, death form hematoma expansion, cerebral edema, brain herniation. This patient's care requires constant monitoring of vital signs, hemodynamics, respiratory and cardiac monitoring, review of multiple databases, neurological assessment, discussion with family, other  specialists and medical decision making of high complexity. I spent31minutes of neurocritical care time in the care of this patient. I had long discussion with son and Dr. Pati Gallo at bedside, updated pt  current condition, treatment plan and potential prognosis.    To contact Stroke Continuity provider, please refer to [email or web address] After hours, contact General Neurology

## 2018-04-10 NOTE — Progress Notes (Signed)
Physical Therapy Treatment Patient Details Name: Hector Pugh MRN: [id number] DOB: September 29, 1961 Today's Date: 04/10/2018    History of Present Illness This 57 y.o. male admitted with c/o HA, vomiting, AMS, and aphasia.  CT of head showed large temporal hemorrhage.  He underwent emergent crani for evacuation.   PMH includes:  pancreatic CA, s/p whipple procedure and subsequent DM.  HTN,    PT Comments    Pt more focused and/or redirectable today.  Noticeable improvement in right LE coordination during set up of transfers and during gait.  Follow Up Recommendations  CIR     Equipment Recommendations  Other (comment)(TBA)    Recommendations for Other Services Rehab consult     Precautions / Restrictions Precautions Precautions: Fall Precaution Comments: Rt inattention--worsening with fatigue Restrictions Weight Bearing Restrictions: No    Mobility  Bed Mobility Overal bed mobility: Needs Assistance Bed Mobility: Supine to Sit     Supine to sit: Min assist;HOB elevated     General bed mobility comments: Pt able to bring BLEs to EOB and elevate trunk. Min A for scooting hips towards EOB and using bed pad to facilitate forward movement.  Transfers Overall transfer level: Needs assistance   Transfers: Sit to/from Stand Sit to Stand: Min assist;+2 safety/equipment         General transfer comment: Min A to power up into standing. +2 for safety due to impulsivity and poor awareness.   Ambulation/Gait Ambulation/Gait assistance: Mod assist;+2 safety/equipment Ambulation Distance (Feet): 90 Feet Assistive device: Rolling walker (2 wheeled)   Gait velocity: decreased   General Gait Details: pt able to keep a more normalized step width at R LE as long as trunk was stabilized and w/shift was assisted.   Stairs             Wheelchair Mobility    Modified Rankin (Stroke Patients Only) Modified Rankin (Stroke Patients Only) Pre-Morbid Rankin Score: No  symptoms Modified Rankin: Moderately severe disability     Balance Overall balance assessment: Needs assistance Sitting-balance support: Feet supported Sitting balance-Leahy Scale: Fair Sitting balance - Comments: static sitting with close min guard assist    Standing balance support: Single extremity supported Standing balance-Leahy Scale: Poor Standing balance comment: requires min/mod A for static standing                             Cognition Arousal/Alertness: Awake/alert Behavior During Therapy: Flat affect Overall Cognitive Status: Impaired/Different from baseline Area of Impairment: Attention;Awareness;Problem solving;Following commands                   Current Attention Level: Sustained   Following Commands: Follows one step commands inconsistently;Follows one step commands with increased time   Awareness: Intellectual Problem Solving: Slow processing;Decreased initiation;Requires verbal cues;Requires tactile cues General Comments: Continue to present with Rt inattention. mildly impulsive.  Communication deficits make accurate assessment of cognition difficult. Pt requiring increased cues and time throughout session. During toileting, pt requirint direct hand over hand to facilitate toilet hygiene due to poor following of verbal cues.      Exercises      General Comments General comments (skin integrity, edema, etc.): Son present for gait portion of session      Pertinent Vitals/Pain Pain Assessment: Faces Faces Pain Scale: No hurt Pain Intervention(s): Monitored during session    Home Living  Prior Function            PT Goals (current goals can now be found in the care plan section) Acute Rehab PT Goals Patient Stated Goal: per daugther, to regain as much independence as possible  PT Goal Formulation: With patient/family Time For Goal Achievement: 04/22/18 Potential to Achieve Goals: Good Progress  towards PT goals: Progressing toward goals    Frequency    Min 4X/week      PT Plan Current plan remains appropriate    Co-evaluation PT/OT/SLP Co-Evaluation/Treatment: Yes Reason for Co-Treatment: Complexity of the patient's impairments (multi-system involvement) PT goals addressed during session: Mobility/safety with mobility OT goals addressed during session: ADL's and self-care      AM-PAC PT "6 Clicks" Daily Activity  Outcome Measure  Difficulty turning over in bed (including adjusting bedclothes, sheets and blankets)?: Unable Difficulty moving from lying on back to sitting on the side of the bed? : Unable Difficulty sitting down on and standing up from a chair with arms (e.g., wheelchair, bedside commode, etc,.)?: Unable Help needed moving to and from a bed to chair (including a wheelchair)?: A Lot Help needed walking in hospital room?: A Lot Help needed climbing 3-5 steps with a railing? : A Lot 6 Click Score: 9    End of Session   Activity Tolerance: Patient tolerated treatment well Patient left: in chair;with call bell/phone within reach;with family/visitor present Nurse Communication: Mobility status PT Visit Diagnosis: Hemiplegia and hemiparesis;Other symptoms and signs involving the nervous system (R29.898) Hemiplegia - Right/Left: Right Hemiplegia - dominant/non-dominant: Dominant Hemiplegia - caused by: Nontraumatic intracerebral hemorrhage     Time: 8502-7741 PT Time Calculation (min) (ACUTE ONLY): 25 min  Charges:  $Gait Training: 8-22 mins                    G Codes:       2018/04/27  Donnella Sham, PT [phone]  (pager)   [name] Apr 27, 2018, 6:45 PM

## 2018-04-10 NOTE — Progress Notes (Signed)
Patient ID: Hector Pugh, male   DOB: 1961/08/05, 57 y.o.   MRN: [id number] BP (!) 147/92   Pulse 89   Temp 98 F (36.7 C) (Oral)   Resp 18   Ht 5\' 6"  (1.676 m)   Wt 72.5 kg (159 lb 13.3 oz)   SpO2 98%   BMI 25.80 kg/m  Alert, oriented to person, place, follows commands Has been up today, is eating Moving all extremities Wound is clean, dry, without signs of infection.

## 2018-04-10 NOTE — Progress Notes (Signed)
Occupational Therapy Treatment Patient Details Name: Hector Pugh MRN: [id number] DOB: September 26, 1961 Today's Date: 04/10/2018    History of present illness This 57 y.o. male admitted with c/o HA, vomiting, AMS, and aphasia.  CT of head showed large temporal hemorrhage.  He underwent emergent crani for evacuation.   PMH includes:  pancreatic CA, s/p whipple procedure and subsequent DM.  HTN,   OT comments  Upon arrival, pt supine in bed, agreeable to participate in therapy, and unaware of BM in bed. Pt requiring Max A for toilet hygiene with Max tactile cues to perform task and then Max A to complete hygiene. Pt demonstrating decreased attention, problem solving, awareness, and following of cues. Continue to recommend dc to CIR for intensive OT to optimize return to PLOF and decreased caregiver burden. Will continue to follow acutely as admitted.    Follow Up Recommendations  CIR;Supervision/Assistance - 24 hour    Equipment Recommendations  None recommended by OT    Recommendations for Other Services Rehab consult    Precautions / Restrictions Precautions Precautions: Fall Precaution Comments: Rt inattention--worsening with fatigue Restrictions Weight Bearing Restrictions: No       Mobility Bed Mobility Overal bed mobility: Needs Assistance Bed Mobility: Supine to Sit     Supine to sit: Min assist;HOB elevated     General bed mobility comments: Pt able to bring BLEs to EOB and elevate trunk. Min A for scooting hips towards EOB and using bed pad to facilitate forward movement.  Transfers Overall transfer level: Needs assistance   Transfers: Sit to/from Stand Sit to Stand: Min assist;+2 safety/equipment         General transfer comment: Min A to power up into standing. +2 for safety due to impulsivity and poor awareness.     Balance Overall balance assessment: Needs assistance Sitting-balance support: Feet supported Sitting balance-Leahy Scale: Fair Sitting balance -  Comments: static sitting with close min guard assist    Standing balance support: Single extremity supported Standing balance-Leahy Scale: Poor Standing balance comment: requires mod A for static standing                            ADL either performed or assessed with clinical judgement   ADL Overall ADL's : Needs assistance/impaired     Grooming: Wash/dry hands;Minimal assistance;Sitting Grooming Details (indicate cue type and reason): Providing hand sanitizer while seated in recliner. Pt stating "thank you" and began rubbing his hands together.         Upper Body Dressing : Moderate assistance;Sitting Upper Body Dressing Details (indicate cue type and reason): Mod A to doff/don gown which was soiled     Toilet Transfer: Moderate assistance;+2 for safety/equipment;Regular Toilet;Ambulation;RW Toilet Transfer Details (indicate cue type and reason): Mod A for safe descent to toilet. Toileting- Clothing Manipulation and Hygiene: Maximal assistance;Sit to/from stand;+2 for safety/equipment Toileting - Clothing Manipulation Details (indicate cue type and reason): Pt with poor following of VCs to perform toilet hygiene after BM. Pt requiring hand over hand to facilitate toilet hygiene. Pt continues to requiring Max A to complete task.      Functional mobility during ADLs: Moderate assistance;+2 for safety/equipment;Cueing for sequencing;Rolling walker General ADL Comments: Pt having BM in bed upoin arrival. Pt requiring Max A for toilet hygiene. When asking pt to complete toilet hygiene, pt washing his face. Requiring hand over hand to complete toilet hygiene. Then required Max A to complete task fully.  Vision       Perception     Praxis      Cognition Arousal/Alertness: Awake/alert Behavior During Therapy: Flat affect Overall Cognitive Status: Impaired/Different from baseline Area of Impairment: Attention;Awareness;Problem solving;Following commands                    Current Attention Level: Sustained   Following Commands: Follows one step commands inconsistently;Follows one step commands with increased time   Awareness: Intellectual Problem Solving: Slow processing;Decreased initiation;Requires verbal cues;Requires tactile cues General Comments: Continue to present with Rt inattention. mildly impulsive.  Communication deficits make accurate assessment of cognition difficult. Pt requiring increased cues and time throughout session. During toileting, pt requirint direct hand over hand to facilitate toilet hygiene due to poor following of verbal cues.        Exercises     Shoulder Instructions       General Comments Son present at end of session    Pertinent Vitals/ Pain       Pain Assessment: Faces Faces Pain Scale: No hurt Pain Intervention(s): Monitored during session  Home Living                                          Prior Functioning/Environment              Frequency  Min 3X/week        Progress Toward Goals  OT Goals(current goals can now be found in the care plan section)  Progress towards OT goals: Progressing toward goals  Acute Rehab OT Goals Patient Stated Goal: per daugther, to regain as much independence as possible  OT Goal Formulation: With patient/family Time For Goal Achievement: 04/22/18 Potential to Achieve Goals: Good ADL Goals Pt Will Perform Eating: with set-up;with supervision;sitting Pt Will Perform Grooming: with min assist;standing Pt Will Perform Upper Body Bathing: with supervision;sitting Pt Will Perform Lower Body Bathing: with min assist;sit to/from stand Pt Will Perform Upper Body Dressing: with min assist;sitting Pt Will Perform Lower Body Dressing: with min assist;sit to/from stand Pt Will Transfer to Toilet: with min assist;ambulating;regular height toilet;bedside commode Pt Will Perform Toileting - Clothing Manipulation and hygiene: with min  assist;sit to/from stand Additional ADL Goal #1: Pt will locate items on Rt with min cues during ADLs  Plan Discharge plan remains appropriate    Co-evaluation    PT/OT/SLP Co-Evaluation/Treatment: Yes Reason for Co-Treatment: Complexity of the patient's impairments (multi-system involvement);For patient/therapist safety;To address functional/ADL transfers   OT goals addressed during session: ADL's and self-care      AM-PAC PT "6 Clicks" Daily Activity     Outcome Measure   Help from another person eating meals?: A Little Help from another person taking care of personal grooming?: A Little Help from another person toileting, which includes using toliet, bedpan, or urinal?: A Lot Help from another person bathing (including washing, rinsing, drying)?: A Lot Help from another person to put on and taking off regular upper body clothing?: A Lot Help from another person to put on and taking off regular lower body clothing?: A Lot 6 Click Score: 14    End of Session Equipment Utilized During Treatment: Rolling walker  OT Visit Diagnosis: Unsteadiness on feet (R26.81);Hemiplegia and hemiparesis;Cognitive communication deficit (R41.841) Symptoms and signs involving cognitive functions: Other Nontraumatic ICH Hemiplegia - Right/Left: Right Hemiplegia - dominant/non-dominant: Dominant Hemiplegia - caused by: Other Nontraumatic intracranial  hemorrhage   Activity Tolerance Patient tolerated treatment well   Patient Left in chair;with call bell/phone within reach;with chair alarm set;with family/visitor present;with nursing/sitter in room   Nurse Communication Mobility status        Time: 1884-1660 OT Time Calculation (min): 25 min  Charges: OT General Charges $OT Visit: 1 Visit OT Treatments $Self Care/Home Management : 8-22 mins  [place], OTR/L Acute Rehab Pager: [phone] Office: [place] 04/10/2018, 4:56 PM

## 2018-04-11 ENCOUNTER — Inpatient Hospital Stay (HOSPITAL_COMMUNITY): Payer: Managed Care, Other (non HMO)

## 2018-04-11 LAB — CBC
HCT: 29.6 % — ABNORMAL LOW (ref 39.0–52.0)
HEMOGLOBIN: 9.6 g/dL — AB (ref 13.0–17.0)
MCH: 28.4 pg (ref 26.0–34.0)
MCHC: 32.4 g/dL (ref 30.0–36.0)
MCV: 87.6 fL (ref 78.0–100.0)
Platelets: 136 10*3/uL — ABNORMAL LOW (ref 150–400)
RBC: 3.38 MIL/uL — AB (ref 4.22–5.81)
RDW: 13.6 % (ref 11.5–15.5)
WBC: 5.5 10*3/uL (ref 4.0–10.5)

## 2018-04-11 LAB — SODIUM
SODIUM: 141 mmol/L (ref 135–145)
SODIUM: 143 mmol/L (ref 135–145)

## 2018-04-11 LAB — URINALYSIS, ROUTINE W REFLEX MICROSCOPIC
BILIRUBIN URINE: NEGATIVE
GLUCOSE, UA: 150 mg/dL — AB
Hgb urine dipstick: NEGATIVE
KETONES UR: NEGATIVE mg/dL
LEUKOCYTES UA: NEGATIVE
NITRITE: NEGATIVE
PH: 7 (ref 5.0–8.0)
PROTEIN: NEGATIVE mg/dL
Specific Gravity, Urine: 1.01 (ref 1.005–1.030)

## 2018-04-11 LAB — BASIC METABOLIC PANEL
ANION GAP: 6 (ref 5–15)
BUN: 15 mg/dL (ref 6–20)
CO2: 24 mmol/L (ref 22–32)
Calcium: 7.9 mg/dL — ABNORMAL LOW (ref 8.9–10.3)
Chloride: 112 mmol/L — ABNORMAL HIGH (ref 101–111)
Creatinine, Ser: 0.79 mg/dL (ref 0.61–1.24)
Glucose, Bld: 124 mg/dL — ABNORMAL HIGH (ref 65–99)
POTASSIUM: 3.5 mmol/L (ref 3.5–5.1)
SODIUM: 142 mmol/L (ref 135–145)

## 2018-04-11 LAB — CULTURE, BLOOD (ROUTINE X 2)
CULTURE: NO GROWTH
CULTURE: NO GROWTH
SPECIAL REQUESTS: ADEQUATE
Special Requests: ADEQUATE

## 2018-04-11 LAB — GLUCOSE, CAPILLARY
GLUCOSE-CAPILLARY: 160 mg/dL — AB (ref 65–99)
GLUCOSE-CAPILLARY: 178 mg/dL — AB (ref 65–99)
Glucose-Capillary: 138 mg/dL — ABNORMAL HIGH (ref 65–99)
Glucose-Capillary: 110 mg/dL — ABNORMAL HIGH (ref 65–99)
Glucose-Capillary: 162 mg/dL — ABNORMAL HIGH (ref 65–99)

## 2018-04-11 MED ORDER — POTASSIUM CHLORIDE CRYS ER 20 MEQ PO TBCR
40.0000 meq | EXTENDED_RELEASE_TABLET | Freq: Once | ORAL | Status: AC
  Administered 2018-04-11: 40 meq via ORAL
  Filled 2018-04-11: qty 2

## 2018-04-11 MED ORDER — CLEVIDIPINE BUTYRATE 0.5 MG/ML IV EMUL
0.0000 mg/h | INTRAVENOUS | Status: DC
  Administered 2018-04-11: 2 mg/h via INTRAVENOUS
  Administered 2018-04-11: 8 mg/h via INTRAVENOUS
  Administered 2018-04-12 (×4): 5 mg/h via INTRAVENOUS
  Administered 2018-04-12: 4 mg/h via INTRAVENOUS
  Administered 2018-04-12 – 2018-04-13 (×2): 6 mg/h via INTRAVENOUS
  Administered 2018-04-13: 4 mg/h via INTRAVENOUS
  Administered 2018-04-13: 2 mg/h via INTRAVENOUS
  Administered 2018-04-13 (×2): 6 mg/h via INTRAVENOUS
  Administered 2018-04-14: 4 mg/h via INTRAVENOUS
  Filled 2018-04-11 (×13): qty 50

## 2018-04-11 MED ORDER — SODIUM CHLORIDE 3 % IV SOLN
INTRAVENOUS | Status: DC
  Administered 2018-04-11 – 2018-04-12 (×3): 75 mL/h via INTRAVENOUS
  Administered 2018-04-12: 40 mL/h via INTRAVENOUS
  Administered 2018-04-12: 75 mL/h via INTRAVENOUS
  Administered 2018-04-13 (×2): 40 mL/h via INTRAVENOUS
  Filled 2018-04-11 (×12): qty 500

## 2018-04-11 MED ORDER — METOPROLOL TARTRATE 25 MG PO TABS
25.0000 mg | ORAL_TABLET | Freq: Two times a day (BID) | ORAL | Status: DC
  Administered 2018-04-11 (×2): 25 mg via ORAL
  Filled 2018-04-11 (×3): qty 1

## 2018-04-11 MED ORDER — LABETALOL HCL 5 MG/ML IV SOLN
10.0000 mg | INTRAVENOUS | Status: DC | PRN
  Administered 2018-04-13 – 2018-04-14 (×2): 20 mg via INTRAVENOUS
  Filled 2018-04-11 (×2): qty 4

## 2018-04-11 NOTE — Progress Notes (Signed)
Nurse called Dr. Erlinda Hong and reports increased lethargy and decreased alertness since this morning.  Initially thought it was secondary to early morning therapy, but is not recovering as expected.  Discussed with Dr. Erlinda Hong.  Will place back on 3% saline. Restart Cleviprex drip for systolic blood pressure goal less than 140.  Check urinalysis.  Check chest x-ray.  Rehab aware.  If lethargy continues, will check CT head later this afternoon. NIHSS this am 8.  Current VS stable w/ BP 138/83. BP as high as 176/104 yesterday. Was 164/91 this am at 0807.   Discussed with DR. Peggye Pitt, MSN, APRN, ANVP-BC, AGPCNP-BC Advanced Practice Stroke Nurse [facility] [place] for Schedule & Pager information 04/11/2018 1:34 PM

## 2018-04-11 NOTE — Progress Notes (Signed)
Patient ID: Hector Pugh, male   DOB: [birth year], 59 y.o.   MRN: [id number] BP (!) 149/93 Comment: titrated cleviprex  Pulse 86   Temp 97.8 F (36.6 C) (Oral)   Resp 15   Ht 5\' 6"  (1.676 m)   Wt 72.5 kg (159 lb 13.3 oz)   SpO2 100%   BMI 25.80 kg/m  Alert and oriented x 4, speech is clear, aphasic. Moving all extremities, right sided plegia Able to stand, walk with assistance Improving. Better tonight than last night.  Wound is clean, dry, without signs of infection.  Fever this morning can easily explain slightly decreased mentation. Head ct remains without any change other than evolution of clot. Would hold off anymore head CT's as they have not changed treatment in any meaningful way.

## 2018-04-11 NOTE — Progress Notes (Signed)
PULMONARY / CRITICAL CARE MEDICINE  PROGRESS NOTE   Patient Summary:    Name: Hector Pugh MRN: [id number] DOB: 1961-06-02    ADMISSION DATE:  04/05/2018 CHIEF COMPLAINT:  ICH HISTORY OF PRESENT ILLNESS:   57 y/o M who presented to [facility] on 5/3 with reports of headache, vomiting and altered mental status. The patient was in his usual state of health until 5/3 am.  He carries a history of pancreatic cancer s/p whipple (2013 / considered cure) with subsequent development of DM.  Prior to admit, he was functional / working full time.  He had a screening colonoscopy on 5/2 which was reportedly clean.  Family reports that the patient complained of headache on the am of 5/2 which they thought was associated with a lack of caffeine intake while NPO for procedure.  During the procedure, he reportedly received 8 mg versed and 50 mcg of fentanyl.  After the procedure, he was sleepy but appropriate.  The patient woke around 0500 and was appropriate.  His wife went to work as usual.  At some point between 0500 and 0800, he went to the kitchen to make tea and went back up stairs.  His wife called to check on him around 0800 and he did not answer the phone.  She called her daughter, who is an ICU nurse, to check on the patient.  On arrival, the patient was reportedly aphasic and had emesis on his shirt and in the bed.  EMS was activated.  He vomited again with EMS transport.  He presented with 4/5 strength per Neurology but did not make it to the OR with 4/5.  Work up in [place] revealed a large temporal hemorrhage.  The patient was taken emergently to the OR for evacuation.  He was returned to ICU on vent postoperatively.  PAST MEDICAL HISTORY :  He  has a past medical history of Biliary stricture (11/11/2012), Cancer ([place]), Diabetes mellitus ([place]) (11/10/2012), Diabetes mellitus without complication ([place]), HTN (hypertension) (11/10/2012), Hypertension, Obstructive jaundice (11/11/2012), Pancreatic cancer ([place]), Pancreatitis  (11/10/2012), and Seasonal allergies. PAST SURGICAL HISTORY: He  has a past surgical history that includes circucision; Hernia repair; Left Foot Surgery; Vasectomy; circumsision; Left Foot Surgery; EUS (11/08/2012); Fine needle aspiration (11/08/2012); ERCP (11/08/2012); laparoscopy (12/12/2012); Whipple procedure (12/12/2012); and Colonoscopy (N/A, 04/04/2018).    New Events  04-08-18 Tolerating extubation well Passed Swallow study PT working with patient Afib controlled with amiodarone Awake, follows commands   04-09-18 No fever, cough, diarrhea. Tolerating po. On 3% saline.  04-10-18 Improving steadily, No chest cx,  remains in sinus rhythm.   04-11-18 Not eating enough per daughter; No cough, pain, diarrhea reported Off 3% saline and clevi drips. awaiting txfr to rehab Temp 100.9 this am  ROS- limited by critical condition.  .  stroke: mapping our early stages of recovery book   Does not apply Once  . amiodarone  200 mg Oral Daily  . amLODipine  10 mg Oral Daily  . Chlorhexidine Gluconate Cloth  6 each Topical Daily  . feeding supplement (ENSURE ENLIVE)  237 mL Oral BID BM  . insulin aspart  0-5 Units Subcutaneous QHS  . insulin aspart  0-9 Units Subcutaneous TID WC  . levETIRAcetam  500 mg Oral BID  . levothyroxine  50 mcg Oral QAC breakfast  . losartan  50 mg Oral BID  . metoprolol tartrate  25 mg Oral BID  . pantoprazole  40 mg Oral Daily  . pneumococcal 23 valent vaccine  0.5  mL Intramuscular Tomorrow-1000  . sennosides  5 mL Per Tube QHS  . sodium chloride flush  10-40 mL Intracatheter Q12H  . tamsulosin  0.4 mg Oral Daily     PHYSICAL EXAM:    Today's Vitals   04/11/18 0807 04/11/18 0820 04/11/18 0830 04/11/18 1030  BP: (!) 164/91  (!) 158/93 137/85  Pulse:   81 87  Resp:   18 12  Temp: (!) 100.9 F (38.3 C)   99.5 F (37.5 C)  TempSrc: Oral   Oral  SpO2:   98% 100%  Weight:      Height:      PainSc:  0-No pain      Awake, alert, follows commands  Eyes:  Pupils equal,No pallor  Neck: No JVD  CVS:  -s1 s2 ; no murmur  Resp:Breath sounds equal; no rales  Abdomen:abd-soft, non tender,    BS+  Extremities:No edema LUE picc site-no erythema  Neuro:  Mild weakness  on right arm/leg  Skin: crani incision clean    LABS:    CBC Latest Ref Rng & Units 04/11/2018 04/10/2018 04/09/2018  WBC 4.0 - 10.5 K/uL 5.5 7.7 7.1  Hemoglobin 13.0 - 17.0 g/dL 9.6(L) 10.5(L) 10.5(L)  Hematocrit 39.0 - 52.0 % 29.6(L) 33.1(L) 32.8(L)  Platelets 150 - 400 K/uL 136(L) 151 145(L)    BMP Latest Ref Rng & Units 04/11/2018 04/10/2018 04/10/2018  Glucose 65 - 99 mg/dL 124(H) - -  BUN 6 - 20 mg/dL 15 - -  Creatinine 0.61 - 1.24 mg/dL 0.79 - -  Sodium 135 - 145 mmol/L 142 142 147(H)  Potassium 3.5 - 5.1 mmol/L 3.5 - -  Chloride 101 - 111 mmol/L 112(H) - -  CO2 22 - 32 mmol/L 24 - -  Calcium 8.9 - 10.3 mg/dL 7.9(L) - -     Phos- 3, Mg- 1.9  Sputum 04-06-18      Gram Stain ABUNDANT WBC PRESENT, PREDOMINANTLY PMN  NO SQUAMOUS EPITHELIAL CELLS SEEN  ABUNDANT GRAM POSITIVE COCCI IN PAIRS  ABUNDANT GRAM NEGATIVE RODS  RARE GRAM POSITIVE RODS     Culture Consistent with normal respiratory flora.         CT brain f/u- 04-11-18  1. Evolving left temporal lobe hemorrhage, similar in size but decreased in attenuation as compared to 04/09/2018, with increased localized vasogenic edema and regional mass effect. Secondary left-to-right shift of 7 mm. 2. Persistent small volume intraventricular hemorrhage, slightly decreased from previous. 3. No other new acute intracranial abnormality.   CHEST X-RAY:    04-09-18 Minimal left basilar atelectasis since extubation. No alveolar pneumonia nor CHF.  Ekg-  04-07-18 sinus- no acute changes 04-07-18- A fib, RVR     Echo- 04-07-18  - Left ventricle: The cavity size was normal. Wall thickness was   normal. Systolic function was normal. The estimated ejection   fraction was in the range of 60% to 65%. Wall motion was  normal;   there were no regional wall motion abnormalities. Left   ventricular diastolic function parameters were normal. - Aortic valve: There was mild regurgitation. - Atrial septum: No defect or patent foramen ovale was identified. - Systemic veins: Patient ventilated so unable to accurately assess   CVP with IVC assessment   Assessment- Plan:  57 y/o M admitted with large left ICH status post evacuation on 04/05/2018.   PULMONARY S/p extubation  No acute process  CARDIOVASCULAR Hypertension Goal systolic blood pressure [phone] Amlodipine, losaartan, clevidipine  Afib >sinus; amiodarone changed to  po Consider Cardiology input eventually. 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 Awaiting inpatient rehab.  txfr to rehab or tele  Bed today    Thank you for letting me participate in the care of your patient.  ^^^^^^^^^^ I  Have personally spent   45   Minutes  In the care of this Patient providing Critical care Services; Time includes review of chart, labs, imaging, coordinating care with other physicians and healthcare team members. Also includes time for frequent reevaluation and additional treatment implementation due to change in clinical condiiton of patient. Excludes time spent for Procedure and Teaching.   ^^^^^^^^^^  Note subject to typographical and grammatical errors;   Any formal questions or concerns about the content, text, or information  contained within the body of this dictation should be directly addressed to the physician  for  clarification.   Evans Lance, MD Pulmonary and [place] [facility] Pager: [phone]

## 2018-04-11 NOTE — Progress Notes (Addendum)
STROKE TEAM PROGRESS NOTE   SUBJECTIVE (INTERVAL HISTORY) His daughter is at the bedside.  Patient just back in bed after being up in the chair this morning.  He is awake and alert, oriented to person and place.  Answering questions appropriately this morning in English without native language encouragement. Repeat CT head stable. Plan transfer to the floor today if no CIR bed available.   OBJECTIVE Temp:  [97.6 F (36.4 C)-100.9 F (38.3 C)] 100.9 F (38.3 C) (05/09 0807) Pulse Rate:  [73-91] 78 (05/09 0700) Cardiac Rhythm: Normal sinus rhythm (05/09 0400) Resp:  [13-24] 20 (05/09 0700) BP: (114-176)/(71-104) 164/91 (05/09 0807) SpO2:  [96 %-100 %] 100 % (05/09 0700)  Recent Labs  Lab 04/09/18 1615 04/09/18 2124 04/10/18 0805 04/10/18 1148 04/10/18 1708  GLUCAP 151* 132* 193* 189* 142*   Recent Labs  Lab 04/07/18 0454  04/07/18 2140  04/08/18 0354  04/09/18 0426  04/10/18 0430 04/10/18 1054 04/10/18 1640 04/10/18 2300 04/11/18 0426  NA 146*   < > 147*   < > 148*   < > 149*   < > 145 145 147* 142 142  K 3.7  --  3.2*  --  3.1*  --  3.0*  --  3.4*  --   --   --  3.5  CL 113*  --  118*  --  118*  --  117*  --  116*  --   --   --  112*  CO2 26  --  24  --  22  --  23  --  22  --   --   --  24  GLUCOSE 138*  --  133*  --  155*  --  196*  --  164*  --   --   --  124*  BUN 20  --  16  --  14  --  12  --  18  --   --   --  15  CREATININE 0.94  --  0.76  --  0.79  --  0.83  --  0.80  --   --   --  0.79  CALCIUM 7.9*  --  7.4*  --  7.4*  --  7.9*  --  8.0*  --   --   --  7.9*  MG 2.0  --  2.0  --  2.0  --   --   --  1.9  --   --   --   --   PHOS 1.9*  --   --   --   --   --  1.8*  --  3.1  --   --   --   --    < > = values in this interval not displayed.   Recent Labs  Lab 04/05/18 0925 04/07/18 0454  AST 23 16  ALT 20 12*  ALKPHOS 44 32*  BILITOT 1.4* 0.8  PROT 6.7 5.5*  ALBUMIN 4.0 3.0*   Recent Labs  Lab 04/05/18 0925  04/06/18 0312 04/07/18 0454  04/09/18 0426 04/10/18 0430 04/11/18 0426  WBC 14.2*  --  11.1* 7.4 7.1 7.7 5.5  NEUTROABS 12.0*  --   --   --   --   --   --   HGB 15.2   < > 11.3* 11.2* 10.5* 10.5* 9.6*  HCT 45.3   < > 34.6* 35.2* 32.8* 33.1* 29.6*  MCV 84.7  --  85.9 89.3 87.0 87.3 87.6  PLT 159  --  159 113* 145* 151 136*   < > = values in this interval not displayed.   Recent Labs  Lab 04/07/18 2140 04/08/18 0343 04/08/18 0950  TROPONINI <0.03 <0.03 <0.03       Component Value Date/Time   CHOL 99 04/06/2018 0325   TRIG 149 04/10/2018 0430   HDL 34 (L) 04/06/2018 0325   CHOLHDL 2.9 04/06/2018 0325   VLDL 20 04/06/2018 0325   LDLCALC 45 04/06/2018 0325   Lab Results  Component Value Date   HGBA1C 6.8 (H) 04/06/2018      Component Value Date/Time   LABOPIA NONE DETECTED 04/06/2018 0504   COCAINSCRNUR NONE DETECTED 04/06/2018 0504   LABBENZ POSITIVE (A) 04/06/2018 0504   AMPHETMU NONE DETECTED 04/06/2018 0504   THCU NONE DETECTED 04/06/2018 0504   LABBARB NONE DETECTED 04/06/2018 0504    Recent Labs  Lab 04/05/18 [place] <10    Ct Head Code Stroke Wo Contrast 04/05/2018 0944 1. Large hemorrhage left temporal lobe with surrounding edema and mass-effect. Intraventricular hemorrhage with mild hydrocephalus. 13 mm midline shift. Etiologies of hemorrhage are not determined on this study but could be due to hypertension, cerebral amyloid, vascular malformation, or less likely tumor. 2. ASPECTS is 10   Ct Angio Head W Or Wo Contrast 04/05/2018  1112 Stable LEFT temporal lobe hemorrhage, with LEFT-to-RIGHT midline shift. Lobar hypertensive bleed is favored. No underlying vascular anomaly, or arterial/venous thrombosis/embolus.   Ct Head Wo Contrast 04/05/2018 2228 1. Postsurgical changes from interval partial evacuation of hematoma within the left temporal lobe with approximately 20 cc residual. 2. Stable small volume of intraventricular hemorrhage and small volume of subarachnoid hemorrhage  predominantly over left convexity. 3. Decreased mass effect with 6 mm left-to-right midline shift, previously 13 mm. Improved patency of lateral ventricles. 4. No new acute intracranial abnormality identified.   Ct Head Wo Contrast 04/06/2018 0913 1. Similar postoperative appearance of residual blood products in the left temporal lobe following partial evacuation of the hematoma. 2. Stable intraventricular hemorrhage without hydrocephalus. 3. 6 mm left-to-right midline shift is stable.   Ct Head Wo Contrast 04/07/2018 1552 1. Stable to slight decrease in size of residual blood products in the left temporal lobe following partial vacuo a shin. 2. Slight decrease in midline shift. 3. Stable interventricular hemorrhage without hydrocephalus.   Ct Head Wo Contrast 04/09/2018 0438 1. Stable left temporal lobe hemorrhage, 5 mm left-to-right midline shift, small volume subarachnoid hemorrhage, and small volume intraventricular hemorrhage. 2. No new acute intracranial abnormality.  Ct Head Wo Contrast 04/11/2018 0540 1. Evolving left temporal lobe hemorrhage, similar in size but decreased in attenuation as compared to 04/09/2018, with increased localized vasogenic edema and regional mass effect. Secondary left-to-right shift of 7 mm. 2. Persistent small volume intraventricular hemorrhage, slightly decreased from previous. 3. No other new acute intracranial abnormality.    TTE  04/07/2018 - Left ventricle: The cavity size was normal. Wall thickness was normal. Systolic function was normal. The estimated ejection fraction was in the range of 60% to 65%. Wall motion was normal; there were no regional wall motion abnormalities. Left ventricular diastolic function parameters were normal. - Aortic valve: There was mild regurgitation. - Atrial septum: No defect or patent foramen ovale was identified. - Systemic veins: Patient ventilated so unable to accurately assess CVP with IVC assessment.  PHYSICAL EXAM General  - Well nourished, well developed, in no distress. Cardiovascular - Regular rate and rhythm. Respiratory - clear to  auscultation  Skin - L scalp staples CDI, no sign edema Neuro - awake, alert. Knows self, place and age. Knows he is here for stroke. Will follow simple midline commands, complex commands difficult. Perseverates occasionally. Cannot name objects or repeat. Social speech intact and can communicate needs. Left gaze preference, able to cross midline to the R more today, left gaze incomplete. No ptosis noted. Right facial droop. Blinks to visual threat on the left, but not on the right. LUE and LLE 5/5. RLE 4/5 and RUE 4+/5 proximal 4/5 distal. DTR 1+ and R babinski. Sensation intact bilaterally. Coordination difficult to test due to aphaisa and gait not tested.   ASSESSMENT/PLAN [name] is a 57 y.o. male with history of pancreatic endocrine tumor s/p wipple procedure in 2012, DM, HTN admitted for slurry speech, N/V and right sided weakness. No tPA given due to [place].    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 Decreased hemorrhage and slight decrease midline shift. Stable IVH.no hydro  Repeat CT 04/09/18 stable. Decreased shift to 62mm. Small SAH and IVH.   Repeat CT 04/11/18 evolving hmg, decreasing size and attenuation. Decreased vasogenic edema & mass effect, still 14mm shift (no real difference). Small IVH remains.  Plan repeat MRI with and without contrast as well as a MRA in 1 month to look for source of hemorrhage as blood resolves  2D Echo  EF 60-65%. No source of embolus   LDL 45  HgbA1c 6.8  SCDs for VTE prophylaxis  Reg diet thin liquids  No antithrombotic prior to admission  Continue  keppra for seizure prevention  Ongoing aggressive stroke risk factor management  Therapy recommendations:  CIR.   Disposition:  Pending   Transfer to the floor today. Plan transfer to [facility] Friday.  Cerebral edema  CT 04/05/18 left temporal large ICH with severe midline shift  S/p hematoma evacation  CT repeat 04/06/18 midline shift 5.64mm down to 5.2 on 5/5 scan  CT repeat 04/09/18 midline shift 62mm, small SAH, small IVH, still with mild L sided midbrain compression  3% saline drip completely off this am at 0526.   Na 142. Stop Q6h monitoring  Diabetes  Secondary to Whipple  HgbA1c 6.8 goal < 7.0  Controlled  CBG monitoring  SSI  Plan to resume oral diabetic meds at d/c  Hypertension BP stable   SBP goal < 160  Off cleviprex   home losartan increased to 50 bid.  Amlodipine 10 daily added  Metoprolol increased to 25 bid  AFIB w/ RVR  Hx post Whipple AF  EICU MD documented repeat onset 04/07/2018 but no strips or EKG to confirm  Started on amiodarone at that time. No indication for amio. Plan to stop. Will discuss with dtr.  Cardiology followup should AF  Other Active Problems  Pancreatic endocrine tumor s/p wipple procedure in 2012  Hypomagnesemia, resolved after replaced1.9  Hypokalemia, resolved  Hospital day # [place], MSN, APRN, ANVP-BC, AGPCNP-BC Advanced Practice Stroke Nurse [place] for Schedule & Pager information 04/11/2018 8:11 AM    ATTENDING NOTE: I reviewed above note and agree with the assessment and plan. I have made any additions or clarifications directly to  the above note. Pt was seen and examined.   Pt seen in the am. Ptdaughter at bedside. Pt was sleeping but was told to have eaten 100% of his breakfast and awake alert and talkative.  Once spiked fever 100.79F.  Had CT repeat showed decreasing hematoma and increasing focal edema. Midline shift seems slightly increased. Na 142 and 3% saline was  off. BP elevated around 160, increased metoprolol dose but continue amlodipine and cozaar. Plan to transfer out of ICU.   Patient still on amiodarone, multiple EKG showed sinus tachycardia with PACs.  Curbsided cardiology, recommend to continue amiodarone at this time and outpatient follow-up with cardiology.  However, called by nurse that pt more lethargic in the afternoon. Came to see pt in pm, pt son, wife are at the bedside. Pt mildly sleepy, but easily arousable, still has mild to moderate global aphasia, able to talk short sentences, with perseverations, but difficulty with answering questions, difficulty with naming or repetition.  Right-sided hemi-paresis continue to improve.  Given lethargy, morning spiking fever, increased BP, and concerning of CT increased edema after tapering off 3%, recommend to resume 3% saline, Cleviprex IV for BP goal < 140, CXR, and UA.  Keep patient in ICU overnight, and close monitoring.  CT head stat if further neuro changes.  Rosalin Hawking, MD PhD Stroke Neurology  04/09/2018 9:26 AM  This patient is critically ill due to [place] s/p hematoma evacuation, brain edema, intubated and at significant risk of neurological worsening, death form hematoma expansion, cerebral edema, brain herniation. This patient's care requires constant monitoring of vital signs, hemodynamics, respiratory and cardiac monitoring, review of multiple databases, neurological assessment, discussion with family, other specialists and medical decision making of high complexity. I spent30minutes of neurocritical care time in the care of this patient.     To contact Stroke Continuity provider, please refer to [email or web address] After hours, contact General Neurology

## 2018-04-11 NOTE — Plan of Care (Signed)
Pt has positive attitude, joking with staff and family today. Pt has fair appetite, eating ~ 50-60% of meals today.

## 2018-04-11 NOTE — Progress Notes (Signed)
Inpatient Rehabilitation-Admissions Coordinator   Spoke with pt, daughter, and wife at the bedside for follow up conversations regarding CIR. AC has received insurance authorization for today with [facility] providing benefit letter to pt and family describing [employer] benefits; however, noted pt now restarted on 3% saline. AC will continue to follow for medical readiness. Call for questions.   Jhonnie Garner, OTR/L  Rehab Admissions Coordinator  [phone] 04/11/2018 1:39 PM

## 2018-04-12 LAB — CBC
HEMATOCRIT: 32.1 % — AB (ref 39.0–52.0)
HEMOGLOBIN: 10.7 g/dL — AB (ref 13.0–17.0)
MCH: 28.4 pg (ref 26.0–34.0)
MCHC: 33.3 g/dL (ref 30.0–36.0)
MCV: 85.1 fL (ref 78.0–100.0)
Platelets: 180 10*3/uL (ref 150–400)
RBC: 3.77 MIL/uL — AB (ref 4.22–5.81)
RDW: 13.3 % (ref 11.5–15.5)
WBC: 8.2 10*3/uL (ref 4.0–10.5)

## 2018-04-12 LAB — SODIUM
SODIUM: 143 mmol/L (ref 135–145)
SODIUM: 142 mmol/L (ref 135–145)
Sodium: 140 mmol/L (ref 135–145)

## 2018-04-12 LAB — BASIC METABOLIC PANEL
ANION GAP: 8 (ref 5–15)
BUN: 14 mg/dL (ref 6–20)
CHLORIDE: 114 mmol/L — AB (ref 101–111)
CO2: 23 mmol/L (ref 22–32)
Calcium: 8.1 mg/dL — ABNORMAL LOW (ref 8.9–10.3)
Creatinine, Ser: 0.77 mg/dL (ref 0.61–1.24)
GFR calc Af Amer: >60 mL/min (ref >60)
GLUCOSE: 147 mg/dL — AB (ref 65–99)
POTASSIUM: 3.4 mmol/L — AB (ref 3.5–5.1)
Sodium: 145 mmol/L (ref 135–145)

## 2018-04-12 LAB — GLUCOSE, CAPILLARY
GLUCOSE-CAPILLARY: 156 mg/dL — AB (ref 65–99)
GLUCOSE-CAPILLARY: 191 mg/dL — AB (ref 65–99)
Glucose-Capillary: 170 mg/dL — ABNORMAL HIGH (ref 65–99)
Glucose-Capillary: 132 mg/dL — ABNORMAL HIGH (ref 65–99)

## 2018-04-12 LAB — MAGNESIUM: Magnesium: 2.2 mg/dL (ref 1.7–2.4)

## 2018-04-12 MED ORDER — METOPROLOL TARTRATE 50 MG PO TABS
50.0000 mg | ORAL_TABLET | Freq: Two times a day (BID) | ORAL | Status: DC
  Administered 2018-04-12 – 2018-04-13 (×3): 50 mg via ORAL
  Filled 2018-04-12 (×4): qty 1

## 2018-04-12 MED ORDER — POTASSIUM CHLORIDE CRYS ER 20 MEQ PO TBCR
40.0000 meq | EXTENDED_RELEASE_TABLET | Freq: Once | ORAL | Status: AC
  Administered 2018-04-12: 40 meq via ORAL
  Filled 2018-04-12: qty 2

## 2018-04-12 NOTE — Progress Notes (Signed)
PULMONARY / CRITICAL CARE MEDICINE  PROGRESS NOTE   Patient Summary:    Name: Hector Pugh MRN: [id number] DOB: 17-Dec-1960    ADMISSION DATE:  04/05/2018  CHIEF COMPLAINT:  ICH HISTORY OF PRESENT ILLNESS:   57 y/o M who presented to [facility] on 5/3 with reports of headache, vomiting and altered mental status. The patient was in his usual state of health until 5/3 am.  He carries a history of pancreatic cancer s/p whipple (2013 / considered cure) with subsequent development of DM.  Prior to admit, he was functional / working full time.  He had a screening colonoscopy on 5/2 which was reportedly clean.  Family reports that the patient complained of headache on the am of 5/2 which they thought was associated with a lack of caffeine intake while NPO for procedure.  During the procedure, he reportedly received 8 mg versed and 50 mcg of fentanyl.  After the procedure, he was sleepy but appropriate.  The patient woke around 0500 and was appropriate.  His wife went to work as usual.  At some point between 0500 and 0800, he went to the kitchen to make tea and went back up stairs.  His wife called to check on him around 0800 and he did not answer the phone.  She called her daughter, who is an ICU nurse, to check on the patient.  On arrival, the patient was reportedly aphasic and had emesis on his shirt and in the bed.  EMS was activated.  He vomited again with EMS transport.  He presented with 4/5 strength per Neurology but did not make it to the OR with 4/5.  Work up in [place] revealed a large temporal hemorrhage.  The patient was taken emergently to the OR for evacuation.  He was returned to ICU on vent postoperatively.   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 PAST SURGICAL HISTORY: He  has a past surgical history that includes circucision; Hernia repair; Left Foot Surgery; Vasectomy; circumsision; Left Foot Surgery; EUS (11/08/2012); Fine needle aspiration (11/08/2012); ERCP (11/08/2012); laparoscopy (12/12/2012); Whipple procedure (12/12/2012); and Colonoscopy (N/A, 04/04/2018).    New Events  04-08-18 Tolerating extubation well Passed Swallow study PT working with patient Afib controlled with amiodarone Awake, follows commands  04-09-18 No fever, cough, diarrhea. Tolerating po. On 3% saline. 04-10-18 Improving steadily, No chest cx,  remains in sinus rhythm. 04-11-18 Not eating enough per daughter; No cough, pain, diarrhea reported Off 3% saline and clevi drips. awaiting txfr to rehab Temp 100.9 this am  04-12-18  Transient lethargy did not recur Continues to improve Good strength Eating better On 3% saline and clevidipine again per Neuro  ROS- limited by critical condition.  Current Meds  Medication Sig  . acetaminophen (TYLENOL) 500 MG tablet Take 500 mg by mouth every 4 (four) hours as needed for moderate pain or headache.   Marland Kitchen glipiZIDE (GLUCOTROL XL) 5 MG 24 hr tablet Take 5 mg by mouth daily with breakfast.   . levothyroxine (SYNTHROID, LEVOTHROID) 50 MCG tablet Take 50 mcg by mouth daily before breakfast.  . losartan (COZAAR) 50 MG tablet Take 50 mg by mouth daily.  . tamsulosin (FLOMAX) 0.4 MG CAPS capsule Take 0.4 mg by mouth daily.    PHYSICAL EXAM:    Today's Vitals  04/12/18 0630 04/12/18 0636 04/12/18 0645 04/12/18 0700  BP:  140/79 (!) 141/83 125/78  Pulse: (!) 102 (!) 103 100 (!) 102  Resp: 18 (!) 21 10 15   Temp:      TempSrc:      SpO2: 100% 100% 100% 100%  Weight:      Height:      PainSc:        Awake, alert, follows commands  Eyes: Pupils equal,No pallor  Neck: No JVD  CVS:  -s1 s2 ; no murmur  Resp:Breath sounds equal; no rales  Abdomen:abd-soft,  non tender,    BS+  Extremities:No edema LUE picc site-no erythema  Neuro:  Mild weakness  on right arm/leg  Skin: crani incision clean    LABS:    CBC Latest Ref Rng & Units 04/12/2018 04/11/2018 04/10/2018  WBC 4.0 - 10.5 K/uL 8.2 5.5 7.7  Hemoglobin 13.0 - 17.0 g/dL 10.7(L) 9.6(L) 10.5(L)  Hematocrit 39.0 - 52.0 % 32.1(L) 29.6(L) 33.1(L)  Platelets 150 - 400 K/uL 180 136(L) 151    BMP Latest Ref Rng & Units 04/12/2018 04/12/2018 04/11/2018  Glucose 65 - 99 mg/dL 147(H) - -  BUN 6 - 20 mg/dL 14 - -  Creatinine 0.61 - 1.24 mg/dL 0.77 - -  Sodium 135 - 145 mmol/L 145 140 143  Potassium 3.5 - 5.1 mmol/L 3.4(L) - -  Chloride 101 - 111 mmol/L 114(H) - -  CO2 22 - 32 mmol/L 23 - -  Calcium 8.9 - 10.3 mg/dL 8.1(L) - -     Phos- 3, Mg- 1.9  Sputum 04-06-18      Gram Stain ABUNDANT WBC PRESENT, PREDOMINANTLY PMN  NO SQUAMOUS EPITHELIAL CELLS SEEN  ABUNDANT GRAM POSITIVE COCCI IN PAIRS  ABUNDANT GRAM NEGATIVE RODS  RARE GRAM POSITIVE RODS     Culture Consistent with normal respiratory flora.         CT brain f/u- 04-11-18  1. Evolving left temporal lobe hemorrhage, similar in size but decreased in attenuation as compared to 04/09/2018, with increased localized vasogenic edema and regional mass effect. Secondary left-to-right shift of 7 mm. 2. Persistent small volume intraventricular hemorrhage, slightly decreased from previous. 3. No other new acute intracranial abnormality.   CHEST X-RAY:    04-11-18 Stable cardiomediastinal silhouette. Left-sided PICC line is unchanged in position. No pneumothorax or pleural effusion is noted. No acute pulmonary disease is noted. Bony thorax is unremarkable.  Ekg-  04-07-18 sinus- no acute changes 04-07-18- A fib, RVR     Echo- 04-07-18  - Left ventricle: The cavity size was normal. Wall thickness was   normal. Systolic function was normal. The estimated ejection   fraction was in the range of 60% to 65%. Wall motion was normal;   there  were no regional wall motion abnormalities. Left   ventricular diastolic function parameters were normal. - Aortic valve: There was mild regurgitation. - Atrial septum: No defect or patent foramen ovale was identified. - Systemic veins: Patient ventilated so unable to accurately assess   CVP with IVC assessment   Assessment- Plan:  57 y/o M admitted with large left ICH status post evacuation on 04/05/2018.   PULMONARY  CARDIOVASCULAR Hypertension Goal systolic blood pressure [phone] Amlodipine, losaartan,  clevidipine iv  Afib >sinus; amiodarone changed to po Consider Cardiology input eventually. 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  Has  inpatient rehab approved.   txfr to  tele  Bed  5/11 and Rehab on 5/13 if progreses without any new untoward events.   Thank you for letting me participate in the care of your patient.  ^^^^^^^^^^ I  Have personally spent   40   Minutes  In the care of this Patient providing Critical care Services; Time includes review of chart, labs, imaging, coordinating care with other physicians and healthcare team members. Also includes time for frequent reevaluation and additional treatment implementation due to change in clinical condiiton of patient. Excludes time spent for Procedure and Teaching.   ^^^^^^^^^^  Note subject to typographical and grammatical errors;   Any formal questions or  concerns about the content, text, or information contained within the body of this dictation should be directly addressed to the physician  for  clarification.   Evans Lance, MD Pulmonary and [facility] Pager: [phone]

## 2018-04-12 NOTE — Progress Notes (Signed)
  Speech Language Pathology Treatment: Cognitive-Linquistic  Patient Details Name: Hector Pugh MRN: [id number] DOB: 1961/10/24 Today's Date: 04/12/2018 Time: 1000-1027 SLP Time Calculation (min) (ACUTE ONLY): 27 min  Assessment / Plan / Recommendation Clinical Impression  No significant improvement in pt comprehension or functional expression today from prior visit. Attempted assessment tasks in [place] with wife with no improvement. Pt did not consistently reply in language of speaker. Language is empty, perseverative, fluent with no pt awareness of errors, Wernicke's presentation. Pt required max verbal and visual cues to read single words, match real objects to written words or repeat words or identify objects to a spoken word. 2/4 accuracy by end of session, mostly with tasks in left visual field. Pt can participate in some automatic language tasks - counting, singing and encouraged this with wife. Provided education regarding aphasia to wife. Pt will be an excellent CIR candidate.   HPI HPI: [name] is a 57 y.o. male with history of pancreatic endocrine tumor s/p wipple procedure in 2012, DM, HTN admitted for slurred speech, N/V and right sided weakness. Dx with left temporal ICH s/p hematoma evacuation 5/3. Remained intubated and on the vent until 5/5.       SLP Plan  Continue with current plan of care       Recommendations                   Plan: Continue with current plan of care       GO                Hector Pugh, Katherene Ponto 04/12/2018, 10:57 AM

## 2018-04-12 NOTE — Plan of Care (Signed)
Pt ate 100% all 3 meals; tolerating well without any nausea or vomiting

## 2018-04-12 NOTE — Progress Notes (Signed)
Physical Therapy Treatment Patient Details Name: Nolton Denis MRN: [id number] DOB: [birth year] Today's Date: 04/12/2018    History of Present Illness This 57 y.o. male admitted with c/o HA, vomiting, AMS, and aphasia.  CT of head showed large temporal hemorrhage.  He underwent emergent crani for evacuation.   PMH includes:  pancreatic CA, s/p whipple procedure and subsequent DM.  HTN,    PT Comments    Improving daily.  Attention to Right remains a significant issue, but pt can now draw his attention more to the right functionally and maintain it longer.  Also emphasized gait stability/quality.  Follow Up Recommendations  CIR     Equipment Recommendations  Other (comment)(TBA)    Recommendations for Other Services Rehab consult     Precautions / Restrictions Precautions Precautions: Fall Precaution Comments: still inattentive to the right side,  worse with fatigue, but improving    Mobility  Bed Mobility Overal bed mobility: Needs Assistance Bed Mobility: Supine to Sit     Supine to sit: Min assist     General bed mobility comments: minimal assist to help direct to EOB, more for awareness than need for assist  Transfers Overall transfer level: Needs assistance   Transfers: Sit to/from Stand Sit to Stand: Min assist;+2 safety/equipment         General transfer comment: cues and assist for pt to come forward more before powering up.  Ambulation/Gait Ambulation/Gait assistance: Min assist;+2 physical assistance Ambulation Distance (Feet): 140 Feet Assistive device: None;1 person hand held assist;2 person hand held assist Gait Pattern/deviations: Step-through pattern Gait velocity: decreased   General Gait Details: pt noticeably more steady, but with mild hip instability and right hip drop which degraded with fatigue.  Pt also needed general balance and upper trunk flexion control.   Stairs             Wheelchair Mobility    Modified Rankin (Stroke  Patients Only) Modified Rankin (Stroke Patients Only) Modified Rankin: Moderately severe disability     Balance Overall balance assessment: Needs assistance Sitting-balance support: Feet supported Sitting balance-Leahy Scale: Fair     Standing balance support: Single extremity supported Standing balance-Leahy Scale: Poor Standing balance comment: not able to maintain static stance without minimal to mod assist.  Tending to list right and posteriorly                            Cognition Arousal/Alertness: Awake/alert Behavior During Therapy: WFL for tasks assessed/performed Overall Cognitive Status: Impaired/Different from baseline                     Current Attention Level: Selective;Sustained   Following Commands: Follows one step commands with increased time;Follows one step commands inconsistently   Awareness: Intellectual(hints of emerging awareness at times) Problem Solving: Slow processing;Decreased initiation;Requires verbal cues;Requires tactile cues;Difficulty sequencing        Exercises      General Comments General comments (skin integrity, edema, etc.): Spent time presenting water and food to patient for managing with R side.  While improved, pt still having trouble maintaining attention on the task in general and especially using the right hand.    Mitten added L UE to reinforce use of the right hand.      Pertinent Vitals/Pain Faces Pain Scale: No hurt    Home Living  Prior Function            PT Goals (current goals can now be found in the care plan section) Acute Rehab PT Goals Patient Stated Goal: per daugther, to regain as much independence as possible  PT Goal Formulation: With patient/family Time For Goal Achievement: 04/22/18 Potential to Achieve Goals: Good Progress towards PT goals: Progressing toward goals    Frequency    Min 4X/week      PT Plan Current plan remains appropriate     Co-evaluation PT/OT/SLP Co-Evaluation/Treatment: Yes Reason for Co-Treatment: Complexity of the patient's impairments (multi-system involvement);To address functional/ADL transfers PT goals addressed during session: Mobility/safety with mobility        AM-PAC PT "6 Clicks" Daily Activity  Outcome Measure  Difficulty turning over in bed (including adjusting bedclothes, sheets and blankets)?: A Little Difficulty moving from lying on back to sitting on the side of the bed? : A Little Difficulty sitting down on and standing up from a chair with arms (e.g., wheelchair, bedside commode, etc,.)?: Unable Help needed moving to and from a bed to chair (including a wheelchair)?: A Little Help needed walking in hospital room?: A Little Help needed climbing 3-5 steps with a railing? : A Lot 6 Click Score: 15    End of Session   Activity Tolerance: Patient tolerated treatment well Patient left: in chair;with call bell/phone within reach;with family/visitor present Nurse Communication: Mobility status PT Visit Diagnosis: Other symptoms and signs involving the nervous system (R29.898);Other abnormalities of gait and mobility (R26.89);Unsteadiness on feet (R26.81)     Time: 6283-6629 PT Time Calculation (min) (ACUTE ONLY): 41 min  Charges:  $Gait Training: 8-22 mins                    G Codes:       04-21-2018  Donnella Sham, PT [phone]  (pager)   Tessie Fass Andy Allende 04-21-2018, 2:23 PM

## 2018-04-12 NOTE — Progress Notes (Signed)
Notified on-call neurologist regarding 0100 Na results - 140. Informed MD that pt's neuro status remains unchanged and there is no increased lethargy, confusion, etc.   MD informed RN to continue w/ q6 Na lab draws and to keep 3% saline gtt at 28ml/hr at this time.  Guadalupe Maple, RN

## 2018-04-12 NOTE — Progress Notes (Signed)
Inpatient Rehabilitation-Admissions Coordinator   [facility] spoke with wife about plan for follow up on Monday as pt being medically monitored over the next few days. Wife verbalized understanding and appreciated communication with her about CIR plans.   Jhonnie Garner, OTR/L  Rehab Admissions Coordinator  [phone] 04/12/2018 2:00 PM

## 2018-04-12 NOTE — Progress Notes (Signed)
Occupational Therapy Treatment Patient Details Name: Hector Pugh MRN: [id number] DOB: 1961-06-28 Today's Date: 04/12/2018    History of present illness This 57 y.o. male admitted with c/o HA, vomiting, AMS, and aphasia.  CT of head showed large temporal hemorrhage.  He underwent emergent crani for evacuation.   PMH includes:  pancreatic CA, s/p whipple procedure and subsequent DM.  HTN,   OT comments  Pt seen with PT.  He requires min - mod A for ambulation - requires mod A at times due to Rt inattention.  He was able to feed self with min A with Rt UE for ~30% of meal.   Family instructed on methods for increasing Rt UE function.   Follow Up Recommendations  CIR;Supervision/Assistance - 24 hour    Equipment Recommendations  None recommended by OT    Recommendations for Other Services      Precautions / Restrictions Precautions Precautions: Fall Precaution Comments: still inattentive to the right side,  worse with fatigue, but improving       Mobility Bed Mobility Overal bed mobility: Needs Assistance Bed Mobility: Supine to Sit     Supine to sit: Min assist     General bed mobility comments: minimal assist to help direct to EOB, more for awareness than need for assist  Transfers Overall transfer level: Needs assistance   Transfers: Sit to/from Stand Sit to Stand: Min assist;+2 safety/equipment Stand pivot transfers: Min assist;+2 physical assistance;+2 safety/equipment       General transfer comment: cues and assist for pt to come forward more before powering up.    Balance Overall balance assessment: Needs assistance Sitting-balance support: Feet supported Sitting balance-Leahy Scale: Fair     Standing balance support: Single extremity supported Standing balance-Leahy Scale: Poor Standing balance comment: Pt loses balance to the Lt and posteriorly - up to mod A to correct                            ADL either performed or assessed with  clinical judgement   ADL Overall ADL's : Needs assistance/impaired Eating/Feeding: Minimal assistance;Sitting Eating/Feeding Details (indicate cue type and reason): Pt tends to use Lt UE for all tasks.  Mitt placed over Lt hand to encourage Rt hand use.  Pt able to feed self finger foods and french fries (30% of meal) with min A - required assist to pick up items.  He fatigued with activity                      Toilet Transfer: Moderate assistance;+2 for safety/equipment;Ambulation;Comfort height toilet;RW           Functional mobility during ADLs: Moderate assistance;Minimal assistance;+2 for physical assistance;+2 for safety/equipment       Vision   Additional Comments: Pt will look to Rt with min cues today    Perception     Praxis      Cognition Arousal/Alertness: Awake/alert Behavior During Therapy: WFL for tasks assessed/performed Overall Cognitive Status: Difficult to assess                     Current Attention Level: Selective;Sustained   Following Commands: Follows one step commands with increased time;Follows one step commands inconsistently   Awareness: Intellectual(hints of emerging awareness at times) Problem Solving: Slow processing;Decreased initiation;Requires verbal cues;Requires tactile cues;Difficulty sequencing          Exercises     Shoulder Instructions  General Comments Spent time presenting water and food to patient for managing with R side.  While improved, pt still having trouble maintaining attention on the task in general and especially using the right hand.    Mitten added L UE to reinforce use of the right hand.    Pertinent Vitals/ Pain       Pain Assessment: Faces Faces Pain Scale: No hurt  Home Living                                          Prior Functioning/Environment              Frequency  Min 3X/week        Progress Toward Goals  OT Goals(current goals can now be found  in the care plan section)  Progress towards OT goals: Progressing toward goals  Acute Rehab OT Goals Patient Stated Goal: per daugther, to regain as much independence as possible   Plan Discharge plan remains appropriate    Co-evaluation    PT/OT/SLP Co-Evaluation/Treatment: Yes Reason for Co-Treatment: Complexity of the patient's impairments (multi-system involvement);Necessary to address cognition/behavior during functional activity;For patient/therapist safety;To address functional/ADL transfers PT goals addressed during session: Mobility/safety with mobility OT goals addressed during session: ADL's and self-care      AM-PAC PT "6 Clicks" Daily Activity     Outcome Measure   Help from another person eating meals?: A Little Help from another person taking care of personal grooming?: A Little Help from another person toileting, which includes using toliet, bedpan, or urinal?: A Lot Help from another person bathing (including washing, rinsing, drying)?: A Lot Help from another person to put on and taking off regular upper body clothing?: A Lot Help from another person to put on and taking off regular lower body clothing?: A Lot 6 Click Score: 14    End of Session    OT Visit Diagnosis: Unsteadiness on feet (R26.81) Symptoms and signs involving cognitive functions: Other Nontraumatic ICH Hemiplegia - Right/Left: Right Hemiplegia - dominant/non-dominant: Dominant Hemiplegia - caused by: Other Nontraumatic intracranial hemorrhage   Activity Tolerance Patient tolerated treatment well   Patient Left in chair;with call bell/phone within reach;with family/visitor present   Nurse Communication Mobility status        Time: 2500-3704 OT Time Calculation (min): 38 min  Charges: OT General Charges $OT Visit: 1 Visit OT Treatments $Self Care/Home Management : 8-22 mins $Neuromuscular Re-education: 8-22 mins  [facility], OTR/L [phone]    Lucille Passy M 04/12/2018,  2:37 PM

## 2018-04-12 NOTE — Progress Notes (Signed)
STROKE TEAM PROGRESS NOTE   SUBJECTIVE (INTERVAL HISTORY) His wife is at bedside. Pt sitting in chair, mildly sleepy but easily arousable. Answer most questions appropriately, still has perseveration and anomia. Able to repeat simple sentences. Right UE and LE strength much improved. Na 145, will taper off 3% saline again. BP stable with cleviprex. No fever. Still has tachycardia.   OBJECTIVE Temp:  [97.8 F (36.6 C)-99.5 F (37.5 C)] 99.2 F (37.3 C) (05/10 0400) Pulse Rate:  [77-118] 88 (05/10 1015) Cardiac Rhythm: Normal sinus rhythm (05/10 0815) Resp:  [0-29] 23 (05/10 1015) BP: (101-165)/(54-124) 133/86 (05/10 1015) SpO2:  [96 %-100 %] 100 % (05/10 1015)  Recent Labs  Lab 04/11/18 0811 04/11/18 1222 04/11/18 1632 04/11/18 2149 04/12/18 0837  GLUCAP 110* 160* 162* 178* 170*   Recent Labs  Lab 04/07/18 0454  04/07/18 2140  04/08/18 0354  04/09/18 0426  04/10/18 0430  04/11/18 0426 04/11/18 1330 04/11/18 1845 04/12/18 0055 04/12/18 0630  NA 146*   < > 147*   < > 148*   < > 149*   < > 145   < > 142 141 143 140 145  K 3.7  --  3.2*  --  3.1*  --  3.0*  --  3.4*  --  3.5  --   --   --  3.4*  CL 113*  --  118*  --  118*  --  117*  --  116*  --  112*  --   --   --  114*  CO2 26  --  24  --  22  --  23  --  22  --  24  --   --   --  23  GLUCOSE 138*  --  133*  --  155*  --  196*  --  164*  --  124*  --   --   --  147*  BUN 20  --  16  --  14  --  12  --  18  --  15  --   --   --  14  CREATININE 0.94  --  0.76  --  0.79  --  0.83  --  0.80  --  0.79  --   --   --  0.77  CALCIUM 7.9*  --  7.4*  --  7.4*  --  7.9*  --  8.0*  --  7.9*  --   --   --  8.1*  MG 2.0  --  2.0  --  2.0  --   --   --  1.9  --   --   --   --   --   --   PHOS 1.9*  --   --   --   --   --  1.8*  --  3.1  --   --   --   --   --   --    < > = values in this interval not displayed.   Recent Labs  Lab 04/07/18 0454  AST 16  ALT 12*  ALKPHOS 32*  BILITOT 0.8  PROT 5.5*  ALBUMIN 3.0*   Recent Labs   Lab 04/07/18 0454 04/09/18 0426 04/10/18 0430 04/11/18 0426 04/12/18 0630  WBC 7.4 7.1 7.7 5.5 8.2  HGB 11.2* 10.5* 10.5* 9.6* 10.7*  HCT 35.2* 32.8* 33.1* 29.6* 32.1*  MCV 89.3 87.0 87.3 87.6 85.1  PLT 113* 145* 151 136* 180  Recent Labs  Lab 04/07/18 2140 04/08/18 0343 04/08/18 0950  TROPONINI <0.03 <0.03 <0.03       Component Value Date/Time   CHOL 99 04/06/2018 0325   TRIG 149 04/10/2018 0430   HDL 34 (L) 04/06/2018 0325   CHOLHDL 2.9 04/06/2018 0325   VLDL 20 04/06/2018 0325   LDLCALC 45 04/06/2018 0325   Lab Results  Component Value Date   HGBA1C 6.8 (H) 04/06/2018      Component Value Date/Time   LABOPIA NONE DETECTED 04/06/2018 0504   COCAINSCRNUR NONE DETECTED 04/06/2018 0504   LABBENZ POSITIVE (A) 04/06/2018 0504   AMPHETMU NONE DETECTED 04/06/2018 0504   THCU NONE DETECTED 04/06/2018 0504   LABBARB NONE DETECTED 04/06/2018 0504    No results for input(s): ETH in the last 168 hours.  Ct Head Code Stroke Wo Contrast 04/05/2018 0944 1. Large hemorrhage left temporal lobe with surrounding edema and mass-effect. Intraventricular hemorrhage with mild hydrocephalus. 13 mm midline shift. Etiologies of hemorrhage are not determined on this study but could be due to hypertension, cerebral amyloid, vascular malformation, or less likely tumor. 2. ASPECTS is 10   Ct Angio Head W Or Wo Contrast 04/05/2018  1112 Stable LEFT temporal lobe hemorrhage, with LEFT-to-RIGHT midline shift. Lobar hypertensive bleed is favored. No underlying vascular anomaly, or arterial/venous thrombosis/embolus.   Ct Head Wo Contrast 04/05/2018 2228 1. Postsurgical changes from interval partial evacuation of hematoma within the left temporal lobe with approximately 20 cc residual. 2. Stable small volume of intraventricular hemorrhage and small volume of subarachnoid hemorrhage predominantly over left convexity. 3. Decreased mass effect with 6 mm left-to-right midline shift, previously 13 mm.  Improved patency of lateral ventricles. 4. No new acute intracranial abnormality identified.   Ct Head Wo Contrast 04/06/2018 0913 1. Similar postoperative appearance of residual blood products in the left temporal lobe following partial evacuation of the hematoma. 2. Stable intraventricular hemorrhage without hydrocephalus. 3. 6 mm left-to-right midline shift is stable.   Ct Head Wo Contrast 04/07/2018 1552 1. Stable to slight decrease in size of residual blood products in the left temporal lobe following partial vacuo a shin. 2. Slight decrease in midline shift. 3. Stable interventricular hemorrhage without hydrocephalus.   Ct Head Wo Contrast 04/09/2018 0438 1. Stable left temporal lobe hemorrhage, 5 mm left-to-right midline shift, small volume subarachnoid hemorrhage, and small volume intraventricular hemorrhage. 2. No new acute intracranial abnormality.  Ct Head Wo Contrast 04/11/2018 0540 1. Evolving left temporal lobe hemorrhage, similar in size but decreased in attenuation as compared to 04/09/2018, with increased localized vasogenic edema and regional mass effect. Secondary left-to-right shift of 7 mm. 2. Persistent small volume intraventricular hemorrhage, slightly decreased from previous. 3. No other new acute intracranial abnormality.    TTE  04/07/2018 - Left ventricle: The cavity size was normal. Wall thickness was normal. Systolic function was normal. The estimated ejection fraction was in the range of 60% to 65%. Wall motion was normal; there were no regional wall motion abnormalities. Left ventricular diastolic function parameters were normal. - Aortic valve: There was mild regurgitation. - Atrial septum: No defect or patent foramen ovale was identified. - Systemic veins: Patient ventilated so unable to accurately assess CVP with IVC assessment.  CT head repeat 04/13/18 pending   PHYSICAL EXAM General - Well nourished, well developed, in no distress. Cardiovascular - Regular  rhythm, but tachycardia. Respiratory - clear to auscultation  Skin - L scalp staples CDI, no sign edema Neuro - mildly sleepy but easily  arousablel Knows self, place and age. Knows he is here for stroke. Knows wife's name. Will follow simple midline commands, complex commands difficult. Perseverates occasionally. Cannot name objects, but able to repeat simple sentence today. Social speech intact and can communicate needs. Left gaze preference, able to cross midline to the R but right gaze incomplete. No ptosis noted. Right facial droop. Blinks to visual threat on the left, but not on the right. LUE and LLE 5/5. RLE 4/5 and RUE 4+/5 proximal 4/5 distal. DTR 1+ and R babinski. Sensation intact bilaterally. Coordination noncooperative and gait not tested.   ASSESSMENT/PLAN Mr. Aimar Borghi is a 57 y.o. male with history of pancreatic endocrine tumor s/p wipple procedure in 2012, DM, HTN admitted for slurry speech, N/V and right sided weakness. No tPA given due to [place].    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 Decreased hemorrhage and slight decrease midline shift. Stable IVH.no hydro  Repeat CT 04/09/18 stable. Decreased shift to 59mm. Small SAH and IVH.   Repeat CT 04/11/18 evolving hmg, decreasing size and attenuation. focal vasogenic edema & mass effect, slightly increased. Small IVH remains.  Repeat CT head 04/13/18 pending  Plan repeat MRI with and without contrast as well as a MRA in 1 month to look for source of hemorrhage as blood resolves  2D Echo  EF 60-65%. No source of embolus   LDL 45  HgbA1c 6.8  SCDs for VTE prophylaxis  Reg diet thin liquids  No antithrombotic prior to admission  Continue keppra  for seizure prevention, plan for 2 weeks course in total due to craniotomy   Ongoing aggressive stroke risk factor management  Therapy recommendations:  CIR  Disposition:  Pending   Plan for taper off saline Saturday and transfer to the floor over the weekend. Plan transfer to CIR Monday. Discussed with Dr. Garvin Fila.   Cerebral edema  CT 04/05/18 left temporal large ICH with severe midline shift  S/p hematoma evacation  CT repeat 04/06/18 midline shift 5.58mm down to 5.2 on 5/5 scan  CT repeat 04/09/18 midline shift 40mm, small SAH, small IVH, still with mild L sided midbrain compression  CT head 04/11/18 slightly increased midline shift and focal mass effect   Pt also more lethargic 04/11/18 - Na 142 - 3% saline resumed.  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  Amlodipine 10 daily added  Metoprolol increased to 50 bid - also try to help for tachycardia too  Taper off cleviprex as able  Atrial tachycardia with PACs - ?? afib with RVR  Hx transient afib post Whipple AF  ICU MD documented repeat onset 04/07/2018 but no strips or EKG to confirm  Continue amio for now  curbsided Cardiology and they would like pt follow up after dc as outpt to decide on amio  Increased metoprolol dose to 50mg  bid  Other Active Problems  Pancreatic endocrine tumor s/p wipple procedure in 2012  Hypokalemia, supplement  Lethargy - CXR and UA neg - likely due to transient low grade fever - resolved   Hospital day # 7  This patient is critically ill due to [place] s/p hematoma evacuation, brain edema, intubated and at significant risk of neurological worsening, death form hematoma expansion, cerebral edema, brain  herniation. This patient's care requires constant monitoring of vital signs, hemodynamics, respiratory and cardiac monitoring, review of multiple databases, neurological assessment, discussion with family, other specialists and medical decision making of high complexity. I spent43minutes of neurocritical care time in the care of this patient.   Rosalin Hawking, MD PhD Stroke Neurology 04/12/2018 10:36 AM    To contact Stroke Continuity provider, please refer to [email or web address] After hours, contact General Neurology

## 2018-04-13 ENCOUNTER — Inpatient Hospital Stay (HOSPITAL_COMMUNITY): Payer: Managed Care, Other (non HMO)

## 2018-04-13 LAB — BASIC METABOLIC PANEL
Anion gap: 6 (ref 5–15)
BUN: 15 mg/dL (ref 6–20)
CALCIUM: 8 mg/dL — AB (ref 8.9–10.3)
CHLORIDE: 113 mmol/L — AB (ref 101–111)
CO2: 23 mmol/L (ref 22–32)
CREATININE: 0.85 mg/dL (ref 0.61–1.24)
GFR calc non Af Amer: >60 mL/min (ref >60)
Glucose, Bld: 175 mg/dL — ABNORMAL HIGH (ref 65–99)
Potassium: 3.7 mmol/L (ref 3.5–5.1)
SODIUM: 142 mmol/L (ref 135–145)

## 2018-04-13 LAB — CBC
HCT: 29 % — ABNORMAL LOW (ref 39.0–52.0)
HEMOGLOBIN: 9.4 g/dL — AB (ref 13.0–17.0)
MCH: 27.8 pg (ref 26.0–34.0)
MCHC: 32.4 g/dL (ref 30.0–36.0)
MCV: 85.8 fL (ref 78.0–100.0)
PLATELETS: 167 10*3/uL (ref 150–400)
RBC: 3.38 MIL/uL — AB (ref 4.22–5.81)
RDW: 13.4 % (ref 11.5–15.5)
WBC: 6.2 10*3/uL (ref 4.0–10.5)

## 2018-04-13 LAB — GLUCOSE, CAPILLARY
GLUCOSE-CAPILLARY: 148 mg/dL — AB (ref 65–99)
GLUCOSE-CAPILLARY: 212 mg/dL — AB (ref 65–99)
GLUCOSE-CAPILLARY: 207 mg/dL — AB (ref 65–99)
Glucose-Capillary: 203 mg/dL — ABNORMAL HIGH (ref 65–99)

## 2018-04-13 LAB — SODIUM
SODIUM: 137 mmol/L (ref 135–145)
Sodium: 141 mmol/L (ref 135–145)
Sodium: 142 mmol/L (ref 135–145)

## 2018-04-13 MED ORDER — SENNA 8.6 MG PO TABS
1.0000 | ORAL_TABLET | Freq: Every day | ORAL | Status: DC
  Administered 2018-04-13 – 2018-04-14 (×2): 8.6 mg via ORAL
  Filled 2018-04-13 (×2): qty 1

## 2018-04-13 MED ORDER — METOPROLOL TARTRATE 50 MG PO TABS
75.0000 mg | ORAL_TABLET | Freq: Two times a day (BID) | ORAL | Status: DC
  Administered 2018-04-13: 75 mg via ORAL
  Filled 2018-04-13: qty 1

## 2018-04-13 NOTE — Progress Notes (Signed)
Patient ID: Hector Pugh, male   DOB: 1961/03/15, 57 y.o.   MRN: [id number] BP 123/73 (BP Location: Right Arm)   Pulse 72   Temp 98.9 F (37.2 C) (Axillary)   Resp 20   Ht 5\' 6"  (1.676 m)   Wt 72.5 kg (159 lb 13.3 oz)   SpO2 99%   BMI 25.80 kg/m  Follows commands Alert, weakness on right side Doing well overall Will need time to improve, but has been getting better with each day. Right facial droop

## 2018-04-13 NOTE — Progress Notes (Addendum)
PULMONARY / CRITICAL CARE MEDICINE  PROGRESS NOTE   Patient Summary:    Name: Hector Pugh MRN: [id number] DOB: [birth year]    ADMISSION DATE:  04/05/2018  CHIEF COMPLAINT:  ICH HISTORY OF PRESENT ILLNESS:   57 y/o M who presented to [facility] on 5/3 with reports of headache, vomiting and altered mental status. The patient was in his usual state of health until 5/3 am.  He carries a history of pancreatic cancer s/p whipple (2013 / considered cure) with subsequent development of DM.  Prior to admit, he was functional / working full time.  He had a screening colonoscopy on 5/2 which was reportedly clean.  Family reports that the patient complained of headache on the am of 5/2 which they thought was associated with a lack of caffeine intake while NPO for procedure.  During the procedure, he reportedly received 8 mg versed and 50 mcg of fentanyl.  After the procedure, he was sleepy but appropriate.  The patient woke around 0500 and was appropriate.  His wife went to work as usual.  At some point between 0500 and 0800, he went to the kitchen to make tea and went back up stairs.  His wife called to check on him around 0800 and he did not answer the phone.  She called her daughter, who is an ICU nurse, to check on the patient.  On arrival, the patient was reportedly aphasic and had emesis on his shirt and in the bed.  EMS was activated.  He vomited again with EMS transport.  He presented with 4/5 strength per Neurology but did not make it to the OR with 4/5.  Work up in [place] revealed a large temporal hemorrhage.  The patient was taken emergently to the OR for evacuation.  He was returned to ICU on vent postoperatively.   PAST MEDICAL HISTORY :  He  has a past medical history of Biliary stricture (11/11/2012), Cancer ([place]), Diabetes mellitus ([place]) (11/10/2012), Diabetes mellitus without complication ([place]), HTN (hypertension) (11/10/2012), Hypertension, Obstructive jaundice (11/11/2012), Pancreatic cancer ([place]),  Pancreatitis (11/10/2012), and Seasonal allergies. PAST SURGICAL HISTORY: He  has a past surgical history that includes circucision; Hernia repair; Left Foot Surgery; Vasectomy; circumsision; Left Foot Surgery; EUS (11/08/2012); Fine needle aspiration (11/08/2012); ERCP (11/08/2012); laparoscopy (12/12/2012); Whipple procedure (12/12/2012); and Colonoscopy (N/A, 04/04/2018).    New Events  04-08-18 Tolerating extubation well Passed Swallow study PT working with patient Afib controlled with amiodarone Awake, follows commands  04-09-18 No fever, cough, diarrhea. Tolerating po. On 3% saline. 04-10-18 Improving steadily, No chest cx,  remains in sinus rhythm. 04-11-18 Not eating enough per daughter; No cough, pain, diarrhea reported Off 3% saline and clevi drips. awaiting txfr to rehab Temp 100.9 this am 04-12-18  Transient lethargy did not recur Continues to improve Good strength Eating better On 3% saline and clevidipine again per Neuro  04-13-2018 Feels good. Fever spiked 101.3 F overnight. No dysuria, diarrhea, purulent sputum 3% saline being weaned Celvi drip on  ROS- limited by clinical  condition.  .  stroke: mapping our early stages of recovery book   Does not apply Once  . amiodarone  200 mg Oral Daily  . amLODipine  10 mg Oral Daily  . Chlorhexidine Gluconate Cloth  6 each Topical Daily  . feeding supplement (ENSURE ENLIVE)  237 mL Oral BID BM  . insulin aspart  0-5 Units Subcutaneous QHS  . insulin aspart  0-9 Units Subcutaneous TID WC  . levETIRAcetam  500 mg Oral BID  .  levothyroxine  50 mcg Oral QAC breakfast  . losartan  50 mg Oral BID  . metoprolol tartrate  50 mg Oral BID  . pantoprazole  40 mg Oral Daily  . pneumococcal 23 valent vaccine  0.5 mL Intramuscular Tomorrow-1000  . sennosides  5 mL Per Tube QHS  . sodium chloride flush  10-40 mL Intracatheter Q12H  . tamsulosin  0.4 mg Oral Daily   PHYSICAL EXAM:    Today's Vitals   04/13/18 0400 04/13/18 0500  04/13/18 0600 04/13/18 0700  BP: 128/86 136/85 136/86 (!) 149/82  Pulse: 74 86 80 87  Resp: (!) 23 (!) 28 18 20   Temp: 99.1 F (37.3 C)     TempSrc: Axillary     SpO2: 100% 100% 100% 100%  Weight:      Height:      PainSc: Asleep       Awake, alert, follows commands  Eyes: Pupils equal,No pallor  Neck: No JVD  CVS:  -s1 s2 ; no murmur  Resp:Breath sounds equal; no rales  Abdomen:abd-soft, non tender,    BS+  Extremities:No edema LUE picc site-no erythema, no tenderness  Neuro:  Mild weakness  on right arm/leg  Skin: crani incision clean    LABS:    CBC Latest Ref Rng & Units 04/13/2018 04/12/2018 04/11/2018  WBC 4.0 - 10.5 K/uL 6.2 8.2 5.5  Hemoglobin 13.0 - 17.0 g/dL 9.4(L) 10.7(L) 9.6(L)  Hematocrit 39.0 - 52.0 % 29.0(L) 32.1(L) 29.6(L)  Platelets 150 - 400 K/uL 167 180 136(L)    BMP Latest Ref Rng & Units 04/13/2018 04/13/2018 04/12/2018  Glucose 65 - 99 mg/dL 175(H) - -  BUN 6 - 20 mg/dL 15 - -  Creatinine 0.61 - 1.24 mg/dL 0.85 - -  Sodium 135 - 145 mmol/L 142 141 142  Potassium 3.5 - 5.1 mmol/L 3.7 - -  Chloride 101 - 111 mmol/L 113(H) - -  CO2 22 - 32 mmol/L 23 - -  Calcium 8.9 - 10.3 mg/dL 8.0(L) - -        Sputum 04-06-18      Gram Stain ABUNDANT WBC PRESENT, PREDOMINANTLY PMN  NO SQUAMOUS EPITHELIAL CELLS SEEN  ABUNDANT GRAM POSITIVE COCCI IN PAIRS  ABUNDANT GRAM NEGATIVE RODS  RARE GRAM POSITIVE RODS     Culture Consistent with normal respiratory flora.         CT brain f/u- [id number].   Stable left anterior temporal lobe hematoma, small volume intraventricular hemorrhage, and small volume subarachnoid hemorrhage. No new hemorrhage. 2. Stable surrounding edema in the left temporal lobe, associated mass effect, 7 mm left-to-right midline shift, and left uncal herniation. 3. Stable small left lateral craniotomy postsurgical changes.   CHEST X-RAY:    04-11-18 Stable cardiomediastinal silhouette. Left-sided PICC line is unchanged in  position. No pneumothorax or pleural effusion is noted. No acute pulmonary disease is noted. Bony thorax is unremarkable.  Ekg-  04-07-18 sinus- no acute changes 04-07-18- A fib, RVR     Echo- 04-07-18  - Left ventricle: The cavity size was normal. Wall thickness was   normal. Systolic function was normal. The estimated ejection   fraction was in the range of 60% to 65%. Wall motion was normal;   there were no regional wall motion abnormalities. Left   ventricular diastolic function parameters were normal. - Aortic valve: There was mild regurgitation. - Atrial septum: No defect or patent foramen ovale was identified. - Systemic veins: Patient ventilated so unable to accurately assess  CVP with IVC assessment   Assessment- Plan:  57 y/o M admitted with large left ICH status post evacuation on 04/05/2018.   PULMONARY No acute process  CARDIOVASCULAR Hypertension Goal systolic blood pressure 093-235 Amlodipine, losaartan, metoprolol May increase metoprolol dose as needed  clevidipine iv- wean as bp allows  Afib >sinus; amiodarone   Po Cardiology input -as outpt If remains on Amiodarone longter- need to monitor LFT, TFT, DLCo (Lung)  RENAL Monitor lytes   GASTROINTESTINAL History of pancreatic cancer status post Whipple 2013 tolerating diet  HEMATOLOGIC SCDs for DVT prophylaxis Anemia of critical illness    ENDOCRINE Diabetes secondary to Whipple Insulin sliding scale  ID Monitor temp pattern Send blood c/s if spikes fever again- has PICC   NEUROLOGIC Large left intracranial hemorrhage Keppra 500 mg every 12 for seizure prophylaxis PT OT speech therapy D/w Neurology 3% saline per Neuro  Counseling Patient/Family:  Updated  Them all intermittently; pt has a very supportive family.  Has  inpatient rehab approved.   txfr to  tele  Bed  5/11 and Rehab on 5/13 if progreses without any new untoward events. - when Neuro agrees.  Thank you for letting me  participate in the care of your patient.  ^^^^^^^^^^ I  Have personally spent  35   Minutes  In the care of this Patient providing Critical care Services; Time includes review of chart, labs, imaging, coordinating care with other physicians and healthcare team members. Also includes time for frequent reevaluation and additional treatment implementation due to change in clinical condiiton of patient. Excludes time spent for Procedure and Teaching.   ^^^^^^^^^^  Note subject to typographical and grammatical errors;   Any formal questions or concerns about the content, text, or information contained within the body of this dictation should be directly addressed to the physician  for  clarification.   Evans Lance, MD [facility] Pager: [phone]

## 2018-04-13 NOTE — Progress Notes (Signed)
STROKE TEAM PROGRESS NOTE   SUBJECTIVE (INTERVAL HISTORY) His wife is at bedside. Pt sitting in chair, mildly sleepy but easily arousable. Answer most questions appropriately, still has perseveration and anomia. Able to repeat simple sentences. Right UE and LE strength much improved. Na 145, will taper off 3% saline again. BP stable with cleviprex. No fever. Still has tachycardia.   OBJECTIVE Temp:  [97.8 F (36.6 C)-101.3 F (38.5 C)] 99.1 F (37.3 C) (05/11 0400) Pulse Rate:  [66-113] 87 (05/11 0700) Cardiac Rhythm: Sinus tachycardia (05/11 0400) Resp:  [12-32] 20 (05/11 0700) BP: (117-155)/(73-124) 149/82 (05/11 0700) SpO2:  [99 %-100 %] 100 % (05/11 0700)  Recent Labs  Lab 04/11/18 2149 04/12/18 0837 04/12/18 1125 04/12/18 1609 04/12/18 2010  GLUCAP 178* 170* 156* 132* 191*   Recent Labs  Lab 04/07/18 0454  04/07/18 2140  04/08/18 0354  04/09/18 0426  04/10/18 0430  04/11/18 0426  04/12/18 0630 04/12/18 0948 04/12/18 1300 04/12/18 1745 04/13/18 0019 04/13/18 0425  NA 146*   < > 147*   < > 148*   < > 149*   < > 145   < > 142   < > 145  --  143 142 141 142  K 3.7  --  3.2*  --  3.1*  --  3.0*  --  3.4*  --  3.5  --  3.4*  --   --   --   --  3.7  CL 113*  --  118*  --  118*  --  117*  --  116*  --  112*  --  114*  --   --   --   --  113*  CO2 26  --  24  --  22  --  23  --  22  --  24  --  23  --   --   --   --  23  GLUCOSE 138*  --  133*  --  155*  --  196*  --  164*  --  124*  --  147*  --   --   --   --  175*  BUN 20  --  16  --  14  --  12  --  18  --  15  --  14  --   --   --   --  15  CREATININE 0.94  --  0.76  --  0.79  --  0.83  --  0.80  --  0.79  --  0.77  --   --   --   --  0.85  CALCIUM 7.9*  --  7.4*  --  7.4*  --  7.9*  --  8.0*  --  7.9*  --  8.1*  --   --   --   --  8.0*  MG 2.0  --  2.0  --  2.0  --   --   --  1.9  --   --   --   --  2.2  --   --   --   --   PHOS 1.9*  --   --   --   --   --  1.8*  --  3.1  --   --   --   --   --   --   --   --   --     < > = values in this interval not displayed.  Recent Labs  Lab 04/07/18 0454  AST 16  ALT 12*  ALKPHOS 32*  BILITOT 0.8  PROT 5.5*  ALBUMIN 3.0*   Recent Labs  Lab 04/09/18 0426 04/10/18 0430 04/11/18 0426 04/12/18 0630 04/13/18 0425  WBC 7.1 7.7 5.5 8.2 6.2  HGB 10.5* 10.5* 9.6* 10.7* 9.4*  HCT 32.8* 33.1* 29.6* 32.1* 29.0*  MCV 87.0 87.3 87.6 85.1 85.8  PLT 145* 151 136* 180 167   Recent Labs  Lab 04/07/18 2140 04/08/18 0343 04/08/18 0950  TROPONINI <0.03 <0.03 <0.03       Component Value Date/Time   CHOL 99 04/06/2018 0325   TRIG 149 04/10/2018 0430   HDL 34 (L) 04/06/2018 0325   CHOLHDL 2.9 04/06/2018 0325   VLDL 20 04/06/2018 0325   LDLCALC 45 04/06/2018 0325   Lab Results  Component Value Date   HGBA1C 6.8 (H) 04/06/2018      Component Value Date/Time   LABOPIA NONE DETECTED 04/06/2018 0504   COCAINSCRNUR NONE DETECTED 04/06/2018 0504   LABBENZ POSITIVE (A) 04/06/2018 0504   AMPHETMU NONE DETECTED 04/06/2018 0504   THCU NONE DETECTED 04/06/2018 0504   LABBARB NONE DETECTED 04/06/2018 0504    No results for input(s): ETH in the last 168 hours.  Ct Head Code Stroke Wo Contrast 04/05/2018 0944 1. Large hemorrhage left temporal lobe with surrounding edema and mass-effect. Intraventricular hemorrhage with mild hydrocephalus. 13 mm midline shift. Etiologies of hemorrhage are not determined on this study but could be due to hypertension, cerebral amyloid, vascular malformation, or less likely tumor. 2. ASPECTS is 10   Ct Angio Head W Or Wo Contrast 04/05/2018  1112 Stable LEFT temporal lobe hemorrhage, with LEFT-to-RIGHT midline shift. Lobar hypertensive bleed is favored. No underlying vascular anomaly, or arterial/venous thrombosis/embolus.   Ct Head Wo Contrast 04/05/2018 2228 1. Postsurgical changes from interval partial evacuation of hematoma within the left temporal lobe with approximately 20 cc residual. 2. Stable small volume of intraventricular  hemorrhage and small volume of subarachnoid hemorrhage predominantly over left convexity. 3. Decreased mass effect with 6 mm left-to-right midline shift, previously 13 mm. Improved patency of lateral ventricles. 4. No new acute intracranial abnormality identified.   Ct Head Wo Contrast 04/06/2018 0913 1. Similar postoperative appearance of residual blood products in the left temporal lobe following partial evacuation of the hematoma. 2. Stable intraventricular hemorrhage without hydrocephalus. 3. 6 mm left-to-right midline shift is stable.   Ct Head Wo Contrast 04/07/2018 1552 1. Stable to slight decrease in size of residual blood products in the left temporal lobe following partial vacuo a shin. 2. Slight decrease in midline shift. 3. Stable interventricular hemorrhage without hydrocephalus.   Ct Head Wo Contrast 04/09/2018 0438 1. Stable left temporal lobe hemorrhage, 5 mm left-to-right midline shift, small volume subarachnoid hemorrhage, and small volume intraventricular hemorrhage. 2. No new acute intracranial abnormality.  Ct Head Wo Contrast 04/11/2018 0540 1. Evolving left temporal lobe hemorrhage, similar in size but decreased in attenuation as compared to 04/09/2018, with increased localized vasogenic edema and regional mass effect. Secondary left-to-right shift of 7 mm. 2. Persistent small volume intraventricular hemorrhage, slightly decreased from previous. 3. No other new acute intracranial abnormality.    TTE  04/07/2018 - Left ventricle: The cavity size was normal. Wall thickness was normal. Systolic function was normal. The estimated ejection fraction was in the range of 60% to 65%. Wall motion was normal; there were no regional wall motion abnormalities. Left ventricular diastolic function parameters were normal. - Aortic  valve: There was mild regurgitation. - Atrial septum: No defect or patent foramen ovale was identified. - Systemic veins: Patient ventilated so unable to accurately  assess CVP with IVC assessment.  CT Brain 5/11   Stable left anterior temporal lobe hematoma, small volume intraventricular hemorrhage, and small volume subarachnoid hemorrhage. No new hemorrhage. 2. Stable surrounding edema in the left temporal lobe, associated mass effect, 7 mm left-to-right midline shift, and left uncal Herniation. 3. Stable cerebral edema    PHYSICAL EXAM General - Well nourished, well developed, in no distress. Cardiovascular - Regular rhythm Respiratory - clear to auscultation  Skin - L scalp staples CDI, no sign edema Neuro - mildly sleepy but easily arousablel Knows self, place and age. Knows he is here for stroke. Knows wife's name. Will follow simple midline commands, complex commands difficult. Perseverates occasionally. Cannot name objects, but able to repeat simple sentence today. Social speech intact and can communicate needs. Left gaze preference, able to cross midline to the R but right gaze incomplete. No ptosis noted. Right facial droop. Blinks to visual threat on the left, but not on the right. LUE and LLE 5/5. RLE 4/5 and RUE 4+/5 proximal 4/5 distal. DTR 1+ and R babinski. Sensation intact bilaterally. Coordination noncooperative and gait not tested.   ASSESSMENT/PLAN [name] is a 57 y.o. male with history of pancreatic endocrine tumor s/p wipple procedure in 2012, DM, HTN admitted for slurry speech, N/V and right sided weakness. No tPA given due to [place].    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 Decreased hemorrhage and slight decrease midline shift. Stable IVH.no hydro  Repeat CT 04/09/18 stable. Decreased shift to 71mm. Small SAH and IVH.   Repeat CT 04/11/18  evolving hmg, decreasing size and attenuation. focal vasogenic edema & mass effect, slightly increased. Small IVH remains.  Repeat CT head 04/13/18 stable no new findings    2D Echo  EF 60-65%. No source of embolus   LDL 45  HgbA1c 6.8  SCDs for VTE prophylaxis  Reg diet thin liquids  No antithrombotic prior to admission  Continue keppra for seizure prevention, plan for 2 weeks course in total due to craniotomy   Ongoing aggressive stroke risk factor management  Therapy recommendations:  CIR  Disposition:  Pending   Ok to tx out of the ICU when off clevidipine   Cerebral edema  CT 04/05/18 left temporal large ICH with severe midline shift  S/p hematoma evacation  CT repeat 04/06/18 midline shift 5.65mm down to 5.2 on 5/5 scan  CT repeat 04/09/18 midline shift 39mm, small SAH, small IVH, still with mild L sided midbrain compression  CT head 04/11/18 slightly increased midline shift and focal mass effect   Pt also more lethargic 04/11/18 - Na 142 - 3% saline resumed.  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  Amlodipine 10 daily added  Metoprolol increased to 50 bid - also try to help for tachycardia too  Taper off cleviprex as able  Atrial tachycardia with PACs - ?? afib with RVR  Hx transient afib post Whipple AF  ICU MD documented repeat onset 04/07/2018 but no strips or EKG to confirm  Continue amio for now  Increased metoprolol dose to 50mg  bid  Other Active Problems  Pancreatic endocrine tumor s/p wipple procedure in 2012  Hypokalemia, supplement  Lethargy - CXR and UA neg - likely due to transient low grade fever - resolved   Daughter - Puja (rapid response RN Operating Room Services) - can be reached at [phone] for updates.  Hospital day #  8  Arnaldo Natal, MD       To contact Stroke Continuity provider, please refer to [email or web address] After hours, contact General Neurology

## 2018-04-14 LAB — CBC
HEMATOCRIT: 26.7 % — AB (ref 39.0–52.0)
Hemoglobin: 8.8 g/dL — ABNORMAL LOW (ref 13.0–17.0)
MCH: 28.1 pg (ref 26.0–34.0)
MCHC: 33 g/dL (ref 30.0–36.0)
MCV: 85.3 fL (ref 78.0–100.0)
PLATELETS: 198 10*3/uL (ref 150–400)
RBC: 3.13 MIL/uL — AB (ref 4.22–5.81)
RDW: 13.5 % (ref 11.5–15.5)
WBC: 7.7 10*3/uL (ref 4.0–10.5)

## 2018-04-14 LAB — GLUCOSE, CAPILLARY
GLUCOSE-CAPILLARY: 175 mg/dL — AB (ref 65–99)
GLUCOSE-CAPILLARY: 175 mg/dL — AB (ref 65–99)
Glucose-Capillary: 248 mg/dL — ABNORMAL HIGH (ref 65–99)
Glucose-Capillary: 186 mg/dL — ABNORMAL HIGH (ref 65–99)

## 2018-04-14 LAB — BASIC METABOLIC PANEL
ANION GAP: 4 — AB (ref 5–15)
BUN: 17 mg/dL (ref 6–20)
CO2: 24 mmol/L (ref 22–32)
Calcium: 7.8 mg/dL — ABNORMAL LOW (ref 8.9–10.3)
Chloride: 113 mmol/L — ABNORMAL HIGH (ref 101–111)
Creatinine, Ser: 0.93 mg/dL (ref 0.61–1.24)
GFR calc Af Amer: >60 mL/min (ref >60)
GLUCOSE: 157 mg/dL — AB (ref 65–99)
POTASSIUM: 3.6 mmol/L (ref 3.5–5.1)
Sodium: 141 mmol/L (ref 135–145)

## 2018-04-14 LAB — SODIUM
Sodium: 140 mmol/L (ref 135–145)
Sodium: 142 mmol/L (ref 135–145)

## 2018-04-14 MED ORDER — POTASSIUM CHLORIDE CRYS ER 20 MEQ PO TBCR
20.0000 meq | EXTENDED_RELEASE_TABLET | Freq: Once | ORAL | Status: AC
  Administered 2018-04-14: 20 meq via ORAL
  Filled 2018-04-14: qty 1

## 2018-04-14 MED ORDER — METOPROLOL TARTRATE 50 MG PO TABS
100.0000 mg | ORAL_TABLET | Freq: Two times a day (BID) | ORAL | Status: DC
  Administered 2018-04-14 – 2018-04-15 (×3): 100 mg via ORAL
  Filled 2018-04-14 (×3): qty 2

## 2018-04-14 NOTE — Progress Notes (Signed)
PULMONARY / CRITICAL CARE MEDICINE  PROGRESS NOTE   Patient Summary:    Name: Hector Pugh MRN: [id number] DOB: 12-19-1960    ADMISSION DATE:  04/05/2018  CHIEF COMPLAINT:  ICH HISTORY OF PRESENT ILLNESS:   57 y/o M who presented to [facility] on 5/3 with reports of headache, vomiting and altered mental status. The patient was in his usual state of health until 5/3 am.  He carries a history of pancreatic cancer s/p whipple (2013 / considered cure) with subsequent development of DM.  Prior to admit, he was functional / working full time.  He had a screening colonoscopy on 5/2 which was reportedly clean.  Family reports that the patient complained of headache on the am of 5/2 which they thought was associated with a lack of caffeine intake while NPO for procedure.  During the procedure, he reportedly received 8 mg versed and 50 mcg of fentanyl.  After the procedure, he was sleepy but appropriate.  The patient woke around 0500 and was appropriate.  His wife went to work as usual.  At some point between 0500 and 0800, he went to the kitchen to make tea and went back up stairs.  His wife called to check on him around 0800 and he did not answer the phone.  She called her daughter, who is an ICU nurse, to check on the patient.  On arrival, the patient was reportedly aphasic and had emesis on his shirt and in the bed.  EMS was activated.  He vomited again with EMS transport.  He presented with 4/5 strength per Neurology but did not make it to the OR with 4/5.  Work up in [place] revealed a large temporal hemorrhage.  The patient was taken emergently to the OR for evacuation.  He was returned to ICU on vent postoperatively.   PAST MEDICAL HISTORY :  He  has a past medical history of Biliary stricture (11/11/2012), Cancer ([place]), Diabetes mellitus ([place]) (11/10/2012), Diabetes mellitus without complication ([place]), HTN (hypertension) (11/10/2012), Hypertension, Obstructive jaundice (11/11/2012), Pancreatic cancer ([place]),  Pancreatitis (11/10/2012), and Seasonal allergies. PAST SURGICAL HISTORY: He  has a past surgical history that includes circucision; Hernia repair; Left Foot Surgery; Vasectomy; circumsision; Left Foot Surgery; EUS (11/08/2012); Fine needle aspiration (11/08/2012); ERCP (11/08/2012); laparoscopy (12/12/2012); Whipple procedure (12/12/2012); and Colonoscopy (N/A, 04/04/2018).    New Events  04-08-18 Tolerating extubation well Passed Swallow study PT working with patient Afib controlled with amiodarone Awake, follows commands  04-09-18 No fever, cough, diarrhea. Tolerating po. On 3% saline. 04-10-18 Improving steadily, No chest cx,  remains in sinus rhythm. 04-11-18 Not eating enough per daughter; No cough, pain, diarrhea reported Off 3% saline and clevi drips. awaiting txfr to rehab Temp 100.9 this am 04-12-18  Transient lethargy did not recur Continues to improve Good strength Eating better On 3% saline and clevidipine again per Neuro 04-13-2018 Feels good. Fever spiked 101.3 F overnight. No dysuria, diarrhea, purulent sputum 3% saline being weaned Celvi drip on  04-14-18 Off clevi and 3% saline drips Ambulated Ate brkfst No new cx Chatting about his work Son In room Neuro cleared to tele transfer today inpt rehab after weekend No fever, headache,sputum ROS- limited by clinical  condition.  .  stroke: mapping our early stages of recovery book   Does not apply Once  . amiodarone  200 mg Oral Daily  . amLODipine  10 mg Oral Daily  . Chlorhexidine Gluconate Cloth  6 each Topical Daily  . feeding supplement (ENSURE ENLIVE)  237 mL Oral BID BM  . insulin aspart  0-5 Units Subcutaneous QHS  . insulin aspart  0-9 Units Subcutaneous TID WC  . levETIRAcetam  500 mg Oral BID  . levothyroxine  50 mcg Oral QAC breakfast  . losartan  50 mg Oral BID  . metoprolol tartrate  100 mg Oral BID  . pantoprazole  40 mg Oral Daily  . pneumococcal 23 valent vaccine  0.5 mL Intramuscular  Tomorrow-1000  . senna  1 tablet Oral Daily  . sodium chloride flush  10-40 mL Intracatheter Q12H  . tamsulosin  0.4 mg Oral Daily   PHYSICAL EXAM:    Today's Vitals   04/14/18 0800 04/14/18 0815 04/14/18 0830 04/14/18 1006  BP: 139/83 135/79 133/83 (!) 144/86  Pulse: 78 73 72 75  Resp: 16 20 (!) 0   Temp: 98.1 F (36.7 C)     TempSrc: Oral     SpO2: 100% 100% 100%   Weight:      Height:      PainSc:        Awake, alert   Eyes: Pupils equal,No pallor  Neck: No JVD  CVS:  -s1 s2 ; no murmur  Resp:Breath sounds equal; no rales  Abdomen:abd-soft, non tender,    BS+  Extremities:No edema LUE picc site-no erythema, no tenderness  Neuro:  Mild weakness  on right arm/leg  Skin: crani incision clean    LABS:    CBC Latest Ref Rng & Units 04/14/2018 04/13/2018 04/12/2018  WBC 4.0 - 10.5 K/uL 7.7 6.2 8.2  Hemoglobin 13.0 - 17.0 g/dL 8.8(L) 9.4(L) 10.7(L)  Hematocrit 39.0 - 52.0 % 26.7(L) 29.0(L) 32.1(L)  Platelets 150 - 400 K/uL 198 167 180    BMP Latest Ref Rng & Units 04/14/2018 04/14/2018 04/14/2018  Glucose 65 - 99 mg/dL - 157(H) -  BUN 6 - 20 mg/dL - 17 -  Creatinine 0.61 - 1.24 mg/dL - 0.93 -  Sodium 135 - 145 mmol/L 142 141 140  Potassium 3.5 - 5.1 mmol/L - 3.6 -  Chloride 101 - 111 mmol/L - 113(H) -  CO2 22 - 32 mmol/L - 24 -  Calcium 8.9 - 10.3 mg/dL - 7.8(L) -       CT brain f/u- 04-13-2018  Stable left anterior temporal lobe hematoma, small volume intraventricular hemorrhage, and small volume subarachnoid hemorrhage. No new hemorrhage. 2. Stable surrounding edema in the left temporal lobe, associated mass effect, 7 mm left-to-right midline shift, and left uncal herniation. 3. Stable small left lateral craniotomy postsurgical changes.   CHEST X-RAY:    04-11-18 Stable cardiomediastinal silhouette. Left-sided PICC line is unchanged in position. No pneumothorax or pleural effusion is noted. No acute pulmonary disease is noted. Bony thorax is  unremarkable.  Ekg-  04-07-18 sinus- no acute changes 04-07-18- A fib, RVR   Echo- 04-07-18 - Left ventricle: The cavity size was normal. Wall thickness was   normal. Systolic function was normal. The estimated ejection   fraction was in the range of 60% to 65%. Wall motion was normal;   there were no regional wall motion abnormalities. Left   ventricular diastolic function parameters were normal. - Aortic valve: There was mild regurgitation. - Atrial septum: No defect or patent foramen ovale was identified. - Systemic veins: Patient ventilated so unable to accurately assess   CVP with IVC assessment   Assessment- Plan: 57 y/o M admitted with large left ICH status post evacuation on 04/05/2018.  PULMONARY No acute process  CARDIOVASCULAR  Hypertension Goal systolic blood pressure [phone] Amlodipine, losaartan, metoprolol May increase metoprolol dose as needed  clevidipine off  Afib >sinus; amiodarone   Po Cardiology input -as outpt If remains on Amiodarone longter- need to monitor LFT, TFT, DLCo (Lung)  RENAL Monitor lytes k repleted   GASTROINTESTINAL History of pancreatic cancer status post Whipple 2013  on po  diet  HEMATOLOGIC SCDs for DVT prophylaxis Anemia of critical illness    ENDOCRINE Diabetes secondary to Whipple Insulin sliding scale  ID Monitor temp pattern Send blood c/s if spikes fever again- has PICC Remove picc soon if no longer needed  NEUROLOGIC Large left intracranial hemorrhage Keppra 500 mg every 12 for seizure prophylaxis PT OT speech therapy D/w Neurology 3% saline  Off   Counseling Patient/Family:  Updated  Them  ; pt has a very supportive family.  Has  inpatient rehab approved.   txfr to  tele  Bed  5/12 and inpt  Rehab on 5/13    Sign out to Hospitalist  Thank you for letting me participate in the care of your patient.  ^^^^^^^^^^ I  Have personally spent  30    Minutes  In the care of this Patient providing Critical care  Services; Time includes review of chart, labs, imaging, coordinating care with other physicians and healthcare team members. Also includes time for frequent reevaluation and additional treatment implementation due to change in clinical condiiton of patient. Excludes time spent for Procedure and Teaching.    Evans Lance, MD Pulmonary and [facility] Pager: [phone]

## 2018-04-14 NOTE — Progress Notes (Signed)
STROKE TEAM PROGRESS NOTE   SUBJECTIVE (INTERVAL HISTORY) Feels great setting in chair this morning with son at bedside, daughter: Hector Pugh was updated about the plan, [name] has been off for an hour, 3% nacl was discontinued this AM.  OBJECTIVE Temp:  [98.3 F (36.8 C)-99.6 F (37.6 C)] 99.1 F (37.3 C) (05/12 0352) Pulse Rate:  [77-119] 77 (05/12 0730) Cardiac Rhythm: Sinus tachycardia (05/11 2000) Resp:  [5-31] 17 (05/12 0730) BP: (108-152)/(57-90) 126/78 (05/12 0730) SpO2:  [92 %-100 %] 100 % (05/12 0730)  Recent Labs  Lab 04/12/18 2010 04/13/18 0823 04/13/18 1205 04/13/18 1635 04/13/18 2121  GLUCAP 191* 148* 212* 203* 207*   Recent Labs  Lab 04/07/18 2140  04/08/18 0354  04/09/18 0426  04/10/18 0430  04/11/18 0426  04/12/18 0630 04/12/18 0948  04/13/18 0425 04/13/18 1312 04/13/18 2033 04/14/18 0112 04/14/18 0507  NA 147*   < > 148*   < > 149*   < > 145   < > 142   < > 145  --    < > 142 142 137 140 141  K 3.2*  --  3.1*  --  3.0*  --  3.4*  --  3.5  --  3.4*  --   --  3.7  --   --   --  3.6  CL 118*  --  118*  --  117*  --  116*  --  112*  --  114*  --   --  113*  --   --   --  113*  CO2 24  --  22  --  23  --  22  --  24  --  23  --   --  23  --   --   --  24  GLUCOSE 133*  --  155*  --  196*  --  164*  --  124*  --  147*  --   --  175*  --   --   --  157*  BUN 16  --  14  --  12  --  18  --  15  --  14  --   --  15  --   --   --  17  CREATININE 0.76  --  0.79  --  0.83  --  0.80  --  0.79  --  0.77  --   --  0.85  --   --   --  0.93  CALCIUM 7.4*  --  7.4*  --  7.9*  --  8.0*  --  7.9*  --  8.1*  --   --  8.0*  --   --   --  7.8*  MG 2.0  --  2.0  --   --   --  1.9  --   --   --   --  2.2  --   --   --   --   --   --   PHOS  --   --   --   --  1.8*  --  3.1  --   --   --   --   --   --   --   --   --   --   --    < > = values in this interval not displayed.   No results for input(s): AST, ALT, ALKPHOS, BILITOT, PROT, ALBUMIN in the last 168 hours. Recent  Labs  Lab 04/10/18 0430 04/11/18 0426 04/12/18 0630 04/13/18 0425 04/14/18 0507  WBC 7.7 5.5 8.2 6.2 7.7  HGB 10.5* 9.6* 10.7* 9.4* 8.8*  HCT 33.1* 29.6* 32.1* 29.0* 26.7*  MCV 87.3 87.6 85.1 85.8 85.3  PLT 151 136* 180 167 198   Recent Labs  Lab 04/07/18 2140 04/08/18 0343 04/08/18 0950  TROPONINI <0.03 <0.03 <0.03       Component Value Date/Time   CHOL 99 04/06/2018 0325   TRIG 149 04/10/2018 0430   HDL 34 (L) 04/06/2018 0325   CHOLHDL 2.9 04/06/2018 0325   VLDL 20 04/06/2018 0325   LDLCALC 45 04/06/2018 0325   Lab Results  Component Value Date   HGBA1C 6.8 (H) 04/06/2018      Component Value Date/Time   LABOPIA NONE DETECTED 04/06/2018 0504   COCAINSCRNUR NONE DETECTED 04/06/2018 0504   LABBENZ POSITIVE (A) 04/06/2018 0504   AMPHETMU NONE DETECTED 04/06/2018 0504   THCU NONE DETECTED 04/06/2018 0504   LABBARB NONE DETECTED 04/06/2018 0504    No results for input(s): ETH in the last 168 hours.  Ct Head Code Stroke Wo Contrast 04/05/2018 0944 1. Large hemorrhage left temporal lobe with surrounding edema and mass-effect. Intraventricular hemorrhage with mild hydrocephalus. 13 mm midline shift. Etiologies of hemorrhage are not determined on this study but could be due to hypertension, cerebral amyloid, vascular malformation, or less likely tumor. 2. ASPECTS is 10   Ct Angio Head W Or Wo Contrast 04/05/2018  1112 Stable LEFT temporal lobe hemorrhage, with LEFT-to-RIGHT midline shift. Lobar hypertensive bleed is favored. No underlying vascular anomaly, or arterial/venous thrombosis/embolus.   Ct Head Wo Contrast 04/05/2018 2228 1. Postsurgical changes from interval partial evacuation of hematoma within the left temporal lobe with approximately 20 cc residual. 2. Stable small volume of intraventricular hemorrhage and small volume of subarachnoid hemorrhage predominantly over left convexity. 3. Decreased mass effect with 6 mm left-to-right midline shift, previously 13 mm.  Improved patency of lateral ventricles. 4. No new acute intracranial abnormality identified.   Ct Head Wo Contrast 04/06/2018 0913 1. Similar postoperative appearance of residual blood products in the left temporal lobe following partial evacuation of the hematoma. 2. Stable intraventricular hemorrhage without hydrocephalus. 3. 6 mm left-to-right midline shift is stable.   Ct Head Wo Contrast 04/07/2018 1552 1. Stable to slight decrease in size of residual blood products in the left temporal lobe following partial vacuo a shin. 2. Slight decrease in midline shift. 3. Stable interventricular hemorrhage without hydrocephalus.   Ct Head Wo Contrast 04/09/2018 0438 1. Stable left temporal lobe hemorrhage, 5 mm left-to-right midline shift, small volume subarachnoid hemorrhage, and small volume intraventricular hemorrhage. 2. No new acute intracranial abnormality.  Ct Head Wo Contrast 04/11/2018 0540 1. Evolving left temporal lobe hemorrhage, similar in size but decreased in attenuation as compared to 04/09/2018, with increased localized vasogenic edema and regional mass effect. Secondary left-to-right shift of 7 mm. 2. Persistent small volume intraventricular hemorrhage, slightly decreased from previous. 3. No other new acute intracranial abnormality.   CT Brain 5/11   Stable left anterior temporal lobe hematoma, small volume intraventricular hemorrhage, and small volume subarachnoid hemorrhage. No new hemorrhage. 2. Stable surrounding edema in the left temporal lobe, associated mass effect, 7 mm left-to-right midline shift, and left uncal Herniation. 3. Stable cerebral edema      TTE  04/07/2018 - Left ventricle: The cavity size was normal. Wall thickness was normal. Systolic function was normal. The estimated ejection fraction was in the range of  60% to 65%. Wall motion was normal; there were no regional wall motion abnormalities. Left ventricular diastolic function parameters were normal. -  Aortic valve: There was mild regurgitation. - Atrial septum: No defect or patent foramen ovale was identified. - Systemic veins: Patient ventilated so unable to accurately assess CVP with IVC assessment.     PHYSICAL EXAM General - Well nourished, well developed, in no distress. Cardiovascular - Regular rhythm Respiratory - clear to auscultation  Skin - L scalp staples CDI, no sign edema Neuro - awake, alert, oriented to self, confused about place and time, follows commands, speech and language intact, PERRLA, partial R neglect, motor 5/5 all group, sensory intact to touch    ASSESSMENT/PLAN Hector Pugh is a 57 y.o. male with history of pancreatic endocrine tumor s/p wipple procedure in 2012, DM, HTN admitted for slurry speech, N/V and right sided weakness. No tPA given due to [place].    ICH - left temporal ICH w/ IVH s/p hematoma evacuation, etiology unclear  Resultant right hemiparesis,  Right hemianopia,  Aphasia improving   CT head left temporal large ICH, with severe midline shift  CTA no AVM or aneurysm  Repeat CT 04/06/18 - stable hematoma s/p evacuation, midline much improved  Repeat CT 04/07/18 stable. Decreased hemorrhage and slight decrease midline shift. Stable IVH.no hydro  Repeat CT 04/09/18 stable. Decreased shift to 10mm. Small SAH and IVH.   Repeat CT 04/11/18 evolving hmg, decreasing size and attenuation. focal vasogenic edema & mass effect, slightly increased. Small IVH remains.  Repeat CT head 04/13/18 stable no new findings    2D Echo  EF 60-65%. No source of embolus   LDL 45  HgbA1c 6.8  SCDs for VTE prophylaxis  Reg diet thin liquids  No antithrombotic prior to admission  Continue keppra for seizure prevention, plan for 2 weeks course in total due to craniotomy   Ongoing aggressive stroke risk factor management  Therapy recommendations:  CIR  Disposition:  Rehab tomorrow if NA/BP stable, can be moved out of the unit to tele bed later today     Ok to tx out of the ICU when off clevidipine   Cerebral edema  CT 04/05/18 left temporal large ICH with severe midline shift  S/p hematoma evacation  CT repeat 04/06/18 midline shift 5.41mm down to 5.2 on 5/5 scan  CT repeat 04/09/18 midline shift 68mm, small SAH, small IVH, still with mild L sided midbrain compression  CT head 04/11/18 slightly increased midline shift and focal mass effect   Pt also more lethargic 04/11/18 - Na 142 - 3% saline resumed.  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  Amlodipine 10 daily added  Metoprolol increased to 100 bid - also try to help for tachycardia too stable HR  Taper off cleviprex as able  Atrial tachycardia with PACs - ?? afib with RVR  Hx transient afib post Whipple AF  ICU MD documented repeat onset 04/07/2018 but no strips or EKG to confirm  Continue amio for now  Increased metoprolol dose to 100mg  bid  Other Active Problems  Pancreatic endocrine tumor s/p wipple procedure in 2012  Hypokalemia,  supplement   Daughter - Puja (rapid response RN [facility]) - can be reached at [phone] for updates.  Hospital day # [place], [place]        To contact Stroke Continuity provider, please refer to [email or web address] After hours, contact General Neurology

## 2018-04-14 NOTE — Plan of Care (Signed)
Pt able to communicate that he needs something yet still needs clarification by staff and family asking questions to determine pt needs. Pt participated in self-care during bath.

## 2018-04-14 NOTE — Progress Notes (Signed)
PICC removed per order.  Site with minimal bruising present.  Cleaned with CHG, vaseline guaze and dry 2x2 placed over site.  No signs of infection or bleeding noted.  RN aware.

## 2018-04-15 ENCOUNTER — Encounter (HOSPITAL_COMMUNITY): Payer: Self-pay | Admitting: *Deleted

## 2018-04-15 ENCOUNTER — Inpatient Hospital Stay (HOSPITAL_COMMUNITY)
Admission: RE | Admit: 2018-04-15 | Discharge: 2018-04-27 | DRG: 056 | Disposition: A | Discharge status: Home / Self Care | Payer: Managed Care, Other (non HMO) | Source: Intra-hospital | Attending: Physical Medicine & Rehabilitation | Admitting: Physical Medicine & Rehabilitation

## 2018-04-15 DIAGNOSIS — I82491 Acute embolism and thrombosis of other specified deep vein of right lower extremity: Secondary | ICD-10-CM | POA: Diagnosis not present

## 2018-04-15 DIAGNOSIS — I611 Nontraumatic intracerebral hemorrhage in hemisphere, cortical: Secondary | ICD-10-CM

## 2018-04-15 DIAGNOSIS — I69151 Hemiplegia and hemiparesis following nontraumatic intracerebral hemorrhage affecting right dominant side: Principal | ICD-10-CM

## 2018-04-15 DIAGNOSIS — E46 Unspecified protein-calorie malnutrition: Secondary | ICD-10-CM | POA: Diagnosis present

## 2018-04-15 DIAGNOSIS — J302 Other seasonal allergic rhinitis: Secondary | ICD-10-CM | POA: Diagnosis present

## 2018-04-15 DIAGNOSIS — N390 Urinary tract infection, site not specified: Secondary | ICD-10-CM

## 2018-04-15 DIAGNOSIS — D62 Acute posthemorrhagic anemia: Secondary | ICD-10-CM

## 2018-04-15 DIAGNOSIS — Z8507 Personal history of malignant neoplasm of pancreas: Secondary | ICD-10-CM | POA: Diagnosis not present

## 2018-04-15 DIAGNOSIS — E119 Type 2 diabetes mellitus without complications: Secondary | ICD-10-CM | POA: Diagnosis present

## 2018-04-15 DIAGNOSIS — I824Z1 Acute embolism and thrombosis of unspecified deep veins of right distal lower extremity: Secondary | ICD-10-CM | POA: Diagnosis not present

## 2018-04-15 DIAGNOSIS — B952 Enterococcus as the cause of diseases classified elsewhere: Secondary | ICD-10-CM | POA: Diagnosis not present

## 2018-04-15 DIAGNOSIS — R509 Fever, unspecified: Secondary | ICD-10-CM

## 2018-04-15 DIAGNOSIS — Z90411 Acquired partial absence of pancreas: Secondary | ICD-10-CM

## 2018-04-15 DIAGNOSIS — R0989 Other specified symptoms and signs involving the circulatory and respiratory systems: Secondary | ICD-10-CM

## 2018-04-15 DIAGNOSIS — Z7984 Long term (current) use of oral hypoglycemic drugs: Secondary | ICD-10-CM | POA: Diagnosis not present

## 2018-04-15 DIAGNOSIS — I69190 Apraxia following nontraumatic intracerebral hemorrhage: Secondary | ICD-10-CM | POA: Diagnosis not present

## 2018-04-15 DIAGNOSIS — I69219 Unspecified symptoms and signs involving cognitive functions following other nontraumatic intracranial hemorrhage: Secondary | ICD-10-CM

## 2018-04-15 DIAGNOSIS — Z2989 Encounter for other specified prophylactic measures: Secondary | ICD-10-CM

## 2018-04-15 DIAGNOSIS — R5081 Fever presenting with conditions classified elsewhere: Secondary | ICD-10-CM | POA: Diagnosis not present

## 2018-04-15 DIAGNOSIS — R7309 Other abnormal glucose: Secondary | ICD-10-CM | POA: Diagnosis not present

## 2018-04-15 DIAGNOSIS — R609 Edema, unspecified: Secondary | ICD-10-CM | POA: Diagnosis present

## 2018-04-15 DIAGNOSIS — G936 Cerebral edema: Secondary | ICD-10-CM

## 2018-04-15 DIAGNOSIS — I10 Essential (primary) hypertension: Secondary | ICD-10-CM | POA: Diagnosis not present

## 2018-04-15 DIAGNOSIS — J9811 Atelectasis: Secondary | ICD-10-CM

## 2018-04-15 DIAGNOSIS — Z298 Encounter for other specified prophylactic measures: Secondary | ICD-10-CM

## 2018-04-15 DIAGNOSIS — G441 Vascular headache, not elsewhere classified: Secondary | ICD-10-CM

## 2018-04-15 DIAGNOSIS — Z79899 Other long term (current) drug therapy: Secondary | ICD-10-CM | POA: Diagnosis not present

## 2018-04-15 DIAGNOSIS — Z79890 Hormone replacement therapy (postmenopausal): Secondary | ICD-10-CM

## 2018-04-15 DIAGNOSIS — I4891 Unspecified atrial fibrillation: Secondary | ICD-10-CM

## 2018-04-15 DIAGNOSIS — I82A11 Acute embolism and thrombosis of right axillary vein: Secondary | ICD-10-CM | POA: Diagnosis not present

## 2018-04-15 DIAGNOSIS — I69120 Aphasia following nontraumatic intracerebral hemorrhage: Secondary | ICD-10-CM | POA: Diagnosis not present

## 2018-04-15 DIAGNOSIS — I612 Nontraumatic intracerebral hemorrhage in hemisphere, unspecified: Secondary | ICD-10-CM

## 2018-04-15 DIAGNOSIS — R531 Weakness: Secondary | ICD-10-CM | POA: Diagnosis not present

## 2018-04-15 DIAGNOSIS — E8809 Other disorders of plasma-protein metabolism, not elsewhere classified: Secondary | ICD-10-CM

## 2018-04-15 LAB — BASIC METABOLIC PANEL
Anion gap: 8 (ref 5–15)
BUN: 15 mg/dL (ref 6–20)
CHLORIDE: 107 mmol/L (ref 101–111)
CO2: 23 mmol/L (ref 22–32)
CREATININE: 0.9 mg/dL (ref 0.61–1.24)
Calcium: 8.2 mg/dL — ABNORMAL LOW (ref 8.9–10.3)
GFR calc non Af Amer: >60 mL/min (ref >60)
GLUCOSE: 164 mg/dL — AB (ref 65–99)
Potassium: 3.7 mmol/L (ref 3.5–5.1)
Sodium: 138 mmol/L (ref 135–145)

## 2018-04-15 LAB — CBC
HCT: 29.9 % — ABNORMAL LOW (ref 39.0–52.0)
Hemoglobin: 9.9 g/dL — ABNORMAL LOW (ref 13.0–17.0)
MCH: 28.1 pg (ref 26.0–34.0)
MCHC: 33.1 g/dL (ref 30.0–36.0)
MCV: 84.9 fL (ref 78.0–100.0)
PLATELETS: 197 10*3/uL (ref 150–400)
RBC: 3.52 MIL/uL — AB (ref 4.22–5.81)
RDW: 13.5 % (ref 11.5–15.5)
WBC: 8.8 10*3/uL (ref 4.0–10.5)

## 2018-04-15 LAB — GLUCOSE, CAPILLARY
GLUCOSE-CAPILLARY: 235 mg/dL — AB (ref 65–99)
GLUCOSE-CAPILLARY: 173 mg/dL — AB (ref 65–99)
Glucose-Capillary: 150 mg/dL — ABNORMAL HIGH (ref 65–99)
Glucose-Capillary: 192 mg/dL — ABNORMAL HIGH (ref 65–99)

## 2018-04-15 MED ORDER — BUTALBITAL-APAP-CAFFEINE 50-325-40 MG PO TABS: 1.0000 | ORAL_TABLET | Freq: Three times a day (TID) | ORAL | 0 refills | Status: DC | PRN

## 2018-04-15 MED ORDER — LOSARTAN POTASSIUM 50 MG PO TABS
50.0000 mg | ORAL_TABLET | Freq: Two times a day (BID) | ORAL | Status: DC
  Administered 2018-04-15 – 2018-04-27 (×24): 50 mg via ORAL
  Filled 2018-04-15 (×24): qty 1

## 2018-04-15 MED ORDER — METOPROLOL TARTRATE 100 MG PO TABS: 100.0000 mg | ORAL_TABLET | Freq: Two times a day (BID) | ORAL | Status: DC

## 2018-04-15 MED ORDER — PROCHLORPERAZINE EDISYLATE 10 MG/2ML IJ SOLN: 5.0000 mg | Freq: Four times a day (QID) | INTRAMUSCULAR | Status: DC | PRN

## 2018-04-15 MED ORDER — ENSURE ENLIVE PO LIQD: 237.0000 mL | Freq: Two times a day (BID) | ORAL | 12 refills | Status: DC

## 2018-04-15 MED ORDER — LEVOTHYROXINE SODIUM 25 MCG PO TABS
50.0000 ug | ORAL_TABLET | Freq: Every day | ORAL | Status: DC
  Administered 2018-04-16 – 2018-04-27 (×13): 50 ug via ORAL
  Filled 2018-04-15 (×13): qty 2

## 2018-04-15 MED ORDER — ACETAMINOPHEN 325 MG PO TABS: 325.0000 mg | ORAL_TABLET | ORAL | Status: DC | PRN

## 2018-04-15 MED ORDER — AMLODIPINE BESYLATE 10 MG PO TABS: 10.0000 mg | ORAL_TABLET | Freq: Every day | ORAL | Status: DC

## 2018-04-15 MED ORDER — LOSARTAN POTASSIUM 50 MG PO TABS: 50.0000 mg | ORAL_TABLET | Freq: Two times a day (BID) | ORAL | Status: DC

## 2018-04-15 MED ORDER — GUAIFENESIN-DM 100-10 MG/5ML PO SYRP: 5.0000 mL | ORAL_SOLUTION | Freq: Four times a day (QID) | ORAL | Status: DC | PRN

## 2018-04-15 MED ORDER — PROCHLORPERAZINE 25 MG RE SUPP: 12.5000 mg | Freq: Four times a day (QID) | RECTAL | Status: DC | PRN

## 2018-04-15 MED ORDER — TAMSULOSIN HCL 0.4 MG PO CAPS
0.4000 mg | ORAL_CAPSULE | Freq: Every day | ORAL | Status: DC
  Administered 2018-04-16 – 2018-04-27 (×12): 0.4 mg via ORAL
  Filled 2018-04-15 (×12): qty 1

## 2018-04-15 MED ORDER — INSULIN ASPART 100 UNIT/ML ~~LOC~~ SOLN
0.0000 [IU] | Freq: Every day | SUBCUTANEOUS | Status: DC
  Administered 2018-04-17: 2 [IU] via SUBCUTANEOUS
  Administered 2018-04-19: 3 [IU] via SUBCUTANEOUS
  Administered 2018-04-20 – 2018-04-26 (×4): 2 [IU] via SUBCUTANEOUS

## 2018-04-15 MED ORDER — LEVETIRACETAM 500 MG PO TABS
500.0000 mg | ORAL_TABLET | Freq: Two times a day (BID) | ORAL | Status: DC
  Administered 2018-04-15 – 2018-04-23 (×16): 500 mg via ORAL
  Filled 2018-04-15 (×16): qty 1

## 2018-04-15 MED ORDER — SENNA 8.6 MG PO TABS
1.0000 | ORAL_TABLET | Freq: Every day | ORAL | Status: DC
  Administered 2018-04-15 – 2018-04-26 (×12): 8.6 mg via ORAL
  Filled 2018-04-15 (×12): qty 1

## 2018-04-15 MED ORDER — BUTALBITAL-APAP-CAFFEINE 50-325-40 MG PO TABS
1.0000 | ORAL_TABLET | Freq: Three times a day (TID) | ORAL | Status: DC | PRN
  Administered 2018-04-21 – 2018-04-22 (×2): 1 via ORAL
  Filled 2018-04-15 (×2): qty 1

## 2018-04-15 MED ORDER — ACETAMINOPHEN 325 MG PO TABS
650.0000 mg | ORAL_TABLET | ORAL | Status: DC | PRN
  Administered 2018-04-16 – 2018-04-18 (×5): 650 mg via ORAL
  Filled 2018-04-15 (×5): qty 2

## 2018-04-15 MED ORDER — LEVETIRACETAM 500 MG PO TABS: 500.0000 mg | ORAL_TABLET | Freq: Two times a day (BID) | ORAL | Status: DC

## 2018-04-15 MED ORDER — CLONIDINE HCL 0.1 MG PO TABS: 0.1000 mg | ORAL_TABLET | ORAL | Status: DC | PRN

## 2018-04-15 MED ORDER — PANTOPRAZOLE SODIUM 40 MG PO TBEC: 40.0000 mg | DELAYED_RELEASE_TABLET | Freq: Every day | ORAL | Status: DC

## 2018-04-15 MED ORDER — PROCHLORPERAZINE MALEATE 5 MG PO TABS: 5.0000 mg | ORAL_TABLET | Freq: Four times a day (QID) | ORAL | Status: DC | PRN

## 2018-04-15 MED ORDER — POLYETHYLENE GLYCOL 3350 17 G PO PACK: 17.0000 g | PACK | Freq: Every day | ORAL | Status: DC | PRN

## 2018-04-15 MED ORDER — PANTOPRAZOLE SODIUM 40 MG PO TBEC
40.0000 mg | DELAYED_RELEASE_TABLET | Freq: Every day | ORAL | Status: DC
  Administered 2018-04-16 – 2018-04-27 (×12): 40 mg via ORAL
  Filled 2018-04-15 (×12): qty 1

## 2018-04-15 MED ORDER — PNEUMOCOCCAL VAC POLYVALENT 25 MCG/0.5ML IJ INJ: 0.5000 mL | INJECTION | INTRAMUSCULAR | Status: DC

## 2018-04-15 MED ORDER — ALUM & MAG HYDROXIDE-SIMETH 200-200-20 MG/5ML PO SUSP: 30.0000 mL | ORAL | Status: DC | PRN

## 2018-04-15 MED ORDER — DIPHENHYDRAMINE HCL 12.5 MG/5ML PO ELIX: 12.5000 mg | ORAL_SOLUTION | Freq: Four times a day (QID) | ORAL | Status: DC | PRN

## 2018-04-15 MED ORDER — INSULIN ASPART 100 UNIT/ML ~~LOC~~ SOLN: 0.0000 [IU] | Freq: Every day | SUBCUTANEOUS | 11 refills | Status: DC

## 2018-04-15 MED ORDER — BISACODYL 10 MG RE SUPP: 10.0000 mg | Freq: Every day | RECTAL | Status: DC | PRN

## 2018-04-15 MED ORDER — INSULIN ASPART 100 UNIT/ML ~~LOC~~ SOLN: 0.0000 [IU] | Freq: Three times a day (TID) | SUBCUTANEOUS | 11 refills | Status: DC

## 2018-04-15 MED ORDER — FLEET ENEMA 7-19 GM/118ML RE ENEM: 1.0000 | ENEMA | Freq: Once | RECTAL | Status: DC | PRN

## 2018-04-15 MED ORDER — SENNA 8.6 MG PO TABS
1.0000 | ORAL_TABLET | Freq: Every day | ORAL | 0 refills | Status: DC
Start: 2018-04-15 — End: 2018-04-26

## 2018-04-15 MED ORDER — AMIODARONE HCL 200 MG PO TABS
200.0000 mg | ORAL_TABLET | Freq: Every day | ORAL | Status: DC
  Administered 2018-04-16 – 2018-04-27 (×12): 200 mg via ORAL
  Filled 2018-04-15 (×12): qty 1

## 2018-04-15 MED ORDER — AMLODIPINE BESYLATE 10 MG PO TABS
10.0000 mg | ORAL_TABLET | Freq: Every day | ORAL | Status: DC
  Administered 2018-04-16 – 2018-04-27 (×12): 10 mg via ORAL
  Filled 2018-04-15 (×12): qty 1

## 2018-04-15 MED ORDER — METOPROLOL TARTRATE 50 MG PO TABS
100.0000 mg | ORAL_TABLET | Freq: Two times a day (BID) | ORAL | Status: DC
  Administered 2018-04-15 – 2018-04-27 (×24): 100 mg via ORAL
  Filled 2018-04-15 (×24): qty 2

## 2018-04-15 MED ORDER — INSULIN ASPART 100 UNIT/ML ~~LOC~~ SOLN
0.0000 [IU] | Freq: Three times a day (TID) | SUBCUTANEOUS | Status: DC
  Administered 2018-04-15: 2 [IU] via SUBCUTANEOUS
  Administered 2018-04-16: 1 [IU] via SUBCUTANEOUS
  Administered 2018-04-16 (×2): 2 [IU] via SUBCUTANEOUS
  Administered 2018-04-17: 1 [IU] via SUBCUTANEOUS
  Administered 2018-04-17 – 2018-04-18 (×2): 3 [IU] via SUBCUTANEOUS
  Administered 2018-04-18: 2 [IU] via SUBCUTANEOUS
  Administered 2018-04-18 – 2018-04-19 (×2): 3 [IU] via SUBCUTANEOUS
  Administered 2018-04-19 – 2018-04-20 (×4): 2 [IU] via SUBCUTANEOUS
  Administered 2018-04-20: 1 [IU] via SUBCUTANEOUS
  Administered 2018-04-21: 3 [IU] via SUBCUTANEOUS
  Administered 2018-04-21 – 2018-04-22 (×3): 1 [IU] via SUBCUTANEOUS
  Administered 2018-04-22: 2 [IU] via SUBCUTANEOUS
  Administered 2018-04-22: 1 [IU] via SUBCUTANEOUS
  Administered 2018-04-23: 2 [IU] via SUBCUTANEOUS
  Administered 2018-04-24: 1 [IU] via SUBCUTANEOUS
  Administered 2018-04-24: 2 [IU] via SUBCUTANEOUS
  Administered 2018-04-25: 1 [IU] via SUBCUTANEOUS
  Administered 2018-04-25 – 2018-04-26 (×2): 2 [IU] via SUBCUTANEOUS

## 2018-04-15 MED ORDER — ENSURE ENLIVE PO LIQD
237.0000 mL | Freq: Two times a day (BID) | ORAL | Status: DC
  Administered 2018-04-16: 237 mL via ORAL

## 2018-04-15 MED ORDER — TRAZODONE HCL 50 MG PO TABS: 25.0000 mg | ORAL_TABLET | Freq: Every evening | ORAL | Status: DC | PRN

## 2018-04-15 NOTE — IPOC Note (Addendum)
Overall Plan of Care [facility]) Patient Details Name: Hector Pugh MRN: [id number] DOB: 01-21-1961  Admitting Diagnosis: Left temporal hemorrhage  Hospital Problems: Active Problems:   Left temporal lobe hemorrhage (HCC)   Vascular headache   Seizure prophylaxis   Cerebral edema (HCC)   Atrial fibrillation with rapid ventricular response (HCC)   Fever   Hypoalbuminemia due to protein-calorie malnutrition (HCC)   Atelectasis   Labile blood pressure   Labile blood glucose   Acute lower UTI     Functional Problem List: Nursing Endurance, Medication Management, Nutrition, Pain, Perception, Safety, Skin Integrity  PT Balance, Behavior, Endurance, Motor, Pain, Perception, Safety, Sensory  OT Balance, Cognition, Endurance, Motor, Pain, Safety, Perception, Vision  SLP Cognition, Linguistic, Motor, Nutrition  TR         Basic ADL's: OT Eating, Grooming, Bathing, Dressing, Toileting     Advanced  ADL's: OT       Transfers: PT Bed Mobility, Bed to Chair, Car, [employer], Futures trader, Metallurgist: PT Ambulation, Emergency planning/management officer, Stairs     Additional Impairments: OT Fuctional Use of Upper Extremity  SLP Swallowing, Communication, Social Cognition comprehension Awareness  TR      Anticipated Outcomes Item Anticipated Outcome  Self Feeding Supervision/setup  Swallowing  Mod I    Basic self-care  Supervision  Toileting  Supervision   Bathroom Transfers Supervision  Bowel/Bladder  patient will remain continent of bowel and bladder with min assist   Transfers  supervision  Locomotion  supervision   Communication  Min assist   Cognition  Min assist   Pain  pain less than or equal to 4/10 with min assist  Safety/Judgment  free from falls/injury and displaying sound safety decisions with min assist   Therapy Plan: PT Intensity: Minimum of 1-2 x/day ,45 to 90 minutes PT Frequency: 5 out of 7 days PT Duration Estimated Length of Stay: 10-12  days OT Intensity: Minimum of 1-2 x/day, 45 to 90 minutes OT Frequency: 5 out of 7 days OT Duration/Estimated Length of Stay: 12-14 days SLP Intensity: Minumum of 1-2 x/day, 30 to 90 minutes SLP Frequency: 3 to 5 out of 7 days SLP Duration/Estimated Length of Stay: 12 days     Team Interventions: Nursing Interventions Patient/Family Education, Pain Management, Medication Management, Skin Care/Wound Management, Cognitive Remediation/Compensation, Discharge Planning, Dysphagia/Aspiration Precaution Training  PT interventions Ambulation/gait training, Disease management/prevention, Pain management, Stair training, Training and development officer, Patient/family education, Therapeutic Activities, Wheelchair propulsion/positioning, DME/adaptive equipment instruction, Cognitive remediation/compensation, Functional electrical stimulation, Psychosocial support, Therapeutic Exercise, Community reintegration, Functional mobility training, Skin care/wound management, UE/LE Strength taining/ROM, Discharge planning, Neuromuscular re-education, Splinting/orthotics, UE/LE Coordination activities  OT Interventions Training and development officer, Cognitive remediation/compensation, Discharge planning, Community reintegration, Disease mangement/prevention, DME/adaptive equipment instruction, Functional electrical stimulation, Functional mobility training, Neuromuscular re-education, Pain management, Patient/family education, Psychosocial support, Self Care/advanced ADL retraining, Skin care/wound managment, Splinting/orthotics, Therapeutic Activities, Therapeutic Exercise, UE/LE Strength taining/ROM, UE/LE Coordination activities, Visual/perceptual remediation/compensation  SLP Interventions Cognitive remediation/compensation, Environmental controls, Multimodal communication approach, Cueing hierarchy, Internal/external aids, Functional tasks, Dysphagia/aspiration precaution training, Patient/family education  TR Interventions     SW/CM Interventions Discharge Planning, Psychosocial Support, Patient/Family Education   Barriers to Discharge MD  Medical stability  Nursing      PT      OT      SLP      SW       Team Discharge Planning: Destination: PT-Home ,OT- Home , SLP-Home Projected Follow-up: PT-Outpatient PT, OT-  Home health  OT, 24 hour supervision/assistance, SLP-Outpatient SLP, 24 hour supervision/assistance Projected Equipment Needs: PT-To be determined, OT- 3 in 1 bedside comode, Tub/shower seat, SLP-None recommended by SLP Equipment Details: PT- , OT-  Patient/family involved in discharge planning: PT- Patient, Family member/caregiver,  OT-Patient, Family member/caregiver, SLP-Patient, Family member/caregiver  MD ELOS: 10-14 days. Medical Rehab Prognosis:  Good Assessment: 57 year old male with history of pancreatic cancer s/p Whipple with onset of DM, HTN who was admitted on 04/05/18 after found with decreased LOC, vomitus on the bed, difficulty speaking and decreased right sided movement--last seen normal few hours earlier. CT head done revealing large temporal lobe hemorrhage with surrounding edema, mass effect, IVH and mild hydrocephalus with 23 mm midline shift.  CTA head showed large temporal lobe hemorrhage without occlusion, embolus, AVM or aneurysm--bleed felt to be lobar hemorrhage. He was taken to OR emergently for left craniotomy and evacuation of hemorrhage by Dr. Christella Noa. Post op placed on Keppra for seizure prophylaxis and started on IV Zosyn due to concerns of aspiration PNA and completed treatment on 5/7.  Hospital course significant for cerebral edema requiring hypertonic saline, coagulopathy with drop in platelets to 113 and INR 1.4, A fib with RVR and labile BP. He was weaned off cleviprex by 5/8 and hypertonic saline by 5/9.  Serial CCT monitored with CT 5/9 showing evolving left temporal lobe with decrease in volume, increase in localized vasogenic edema with partial effacement of left  lateral ventricle, stable infarct in left posterior temporal occipital lobe and no hydrocephalus.  He did develop lethargy later that day and hypertonic saline as well as Cleviprex resumed. CT head on 5/11 reviewed, stable.  Dr. Christella Noa evaluated films and felt that no significant changes noted and no need to check further CT as patient neurologically stable. Hypertonic saline and clevidipine weaned off yesterday and BP/neuro status stable.  He is showing increase in verbal output. Neurology recommends Keppra for 2 weeks total for seizure prophylaxis. Fevers and resting tachycardia have resolved.  Therapy ongoing and patient is showing improvement in endurance levels. He continues to be limited by left gaze preference with right inattention, right hemiparesis, receptive > expressive aphasia and CIR recommended due to functional deficits. Will set goal for Supervision with PT/OT and Min A with SLP.  See Team Conference Notes for weekly updates to the plan of care

## 2018-04-15 NOTE — Progress Notes (Signed)
PROGRESS NOTE    Hector Pugh  EAV:[id number] DOB: 02-04-1961 DOA: 04/05/2018 PCP: [name], MD   Brief Narrative: Patient is a 57 year old male with past medical history of pancreatic cancer status post Whipple procedure with subsequent development of  diabetes mellitus who presented with complaints of headache, vomiting and altered mental status on 04/05/18.  Patient was found to have large temporal hemorrhage and was emergently taken to the OR for evacuation.  He returned to ICU on vent postoperatively.  He has already been extubated.PCCM was following as consult service.  He has been transferred to hospitalist team as  consulting today.Neuro is primary. His overall status is stable.  Plan is to discharge him to inpatient rehab today.   Assessment & Plan:   Active Problems:   Intracranial hemorrhage (HCC)   Pneumonia   Diabetes mellitus type 2 in nonobese (HCC)   Benign essential HTN   History of pancreatic cancer   FUO (fever of unknown origin)   Dysphagia, post-stroke   Tachycardia   Tachypnea   Hypothyroidism   Hypernatremia   Hypokalemia   Acute blood loss anemia   Thrombocytopenia (HCC)   Intracerebral hemorrhage  Intracranial hemorrhage :Found to have large left intracranial hemorrhage at temporal lobe.  Status post craniotomy for evacuation. Currently stable.On Keppra 500 mg every 12 for seizure prophylaxis. PT/OT following.  Hypertension: Blood pressure goal is 120-140. BP currently stable.  Continue amlodipine, losartan and metoprolol.    Paroxysmal A. fib: Currently in normal sinus rhythm.  Currently on amiodarone oral.  Follow-up with cardiology as an outpatient.  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  Hypothyroidism: Continue levothyroxine.  BPH: Continue tamsulosin   DVT prophylaxis: SCD Code Status: Full Family Communication: Daughter present at the bedside Disposition Plan: To inpatient rehab today  Procedures:  Craniotomy, intubation  Antimicrobials: None  Subjective: Patient seen and examined the bedside this morning.  Was comfortable.  Lying on the bed and eating his food.  Denies any complaints.  Objective: Vitals:   04/15/18 0700 04/15/18 0800 04/15/18 0900 04/15/18 1000  BP: 126/80 121/81 133/78 126/82  Pulse: 76 79 85 73  Resp: 16 18 16  (!) 21  Temp:  99.1 F (37.3 C)    TempSrc:  Oral    SpO2: 100% 100% 98% 100%  Weight:      Height:        Intake/Output Summary (Last 24 hours) at 04/15/2018 1101 Last data filed at 04/15/2018 0700 Gross per 24 hour  Intake -  Output 1425 ml  Net -1425 ml   Filed Weights   04/05/18 0900 04/05/18 0948 04/05/18 1313  Weight: 70.5 kg (155 lb 6.8 oz) 70.5 kg (155 lb 6.8 oz) 72.5 kg (159 lb 13.3 oz)    Examination:  General exam: Appears calm and comfortable ,Not in distress,average built HEENT:PERRL,Oral mucosa moist, Ear/Nose normal on gross exam,sutures on the left scalp from craniotomy Respiratory system: Bilateral equal air entry, normal vesicular breath sounds, no wheezes or crackles  Cardiovascular system: S1 & S2 heard, RRR. No JVD, murmurs, rubs, gallops or clicks. No pedal edema. Gastrointestinal system: Abdomen is nondistended, soft and nontender. No organomegaly or masses felt. Normal bowel sounds heard. Central nervous system: Alert and oriented. Extremities: No edema, no clubbing ,no cyanosis, distal peripheral pulses palpable. Skin: No rashes, lesions or ulcers,no icterus ,no pallor Psychiatry: Judgement and insight appear normal. Mood & affect appropriate.     Data Reviewed: I have personally reviewed following labs and imaging studies  CBC: Recent Labs  Lab 04/11/18 0426 04/12/18 0630 04/13/18 0425 04/14/18 0507 04/15/18 0225  WBC 5.5 8.2 6.2 7.7 8.8  HGB 9.6* 10.7* 9.4* 8.8* 9.9*  HCT 29.6* 32.1* 29.0* 26.7* 29.9*  MCV 87.6 85.1 85.8 85.3 84.9  PLT 136* 180 167 198 627   Basic Metabolic Panel: Recent Labs    Lab 04/09/18 0426  04/10/18 0430  04/11/18 0426  04/12/18 0630 04/12/18 0948  04/13/18 0425  04/13/18 2033 04/14/18 0112 04/14/18 0507 04/14/18 0712 04/15/18 0225  NA 149*   < > 145   < > 142   < > 145  --    < > 142   < > 137 140 141 142 138  K 3.0*  --  3.4*  --  3.5  --  3.4*  --   --  3.7  --   --   --  3.6  --  3.7  CL 117*  --  116*  --  112*  --  114*  --   --  113*  --   --   --  113*  --  107  CO2 23  --  22  --  24  --  23  --   --  23  --   --   --  24  --  23  GLUCOSE 196*  --  164*  --  124*  --  147*  --   --  175*  --   --   --  157*  --  164*  BUN 12  --  18  --  15  --  14  --   --  15  --   --   --  17  --  15  CREATININE 0.83  --  0.80  --  0.79  --  0.77  --   --  0.85  --   --   --  0.93  --  0.90  CALCIUM 7.9*  --  8.0*  --  7.9*  --  8.1*  --   --  8.0*  --   --   --  7.8*  --  8.2*  MG  --   --  1.9  --   --   --   --  2.2  --   --   --   --   --   --   --   --   PHOS 1.8*  --  3.1  --   --   --   --   --   --   --   --   --   --   --   --   --    < > = values in this interval not displayed.   GFR: Estimated Creatinine Clearance: 80.7 mL/min (by C-G formula based on SCr of 0.9 mg/dL). Liver Function Tests: No results for input(s): AST, ALT, ALKPHOS, BILITOT, PROT, ALBUMIN in the last 168 hours. No results for input(s): LIPASE, AMYLASE in the last 168 hours. No results for input(s): AMMONIA in the last 168 hours. Coagulation Profile: No results for input(s): INR, PROTIME in the last 168 hours. Cardiac Enzymes: No results for input(s): CKTOTAL, CKMB, CKMBINDEX, TROPONINI in the last 168 hours. BNP (last 3 results) No results for input(s): PROBNP in the last 8760 hours. HbA1C: No results for input(s): HGBA1C in the last 72 hours. CBG: Recent Labs  Lab 04/14/18 0842 04/14/18 1152 04/14/18 1553 04/14/18 2321  04/15/18 0815  GLUCAP 175* 248* 186* 175* 150*   Lipid Profile: No results for input(s): CHOL, HDL, LDLCALC, TRIG, CHOLHDL, LDLDIRECT in the  last 72 hours. Thyroid Function Tests: No results for input(s): TSH, T4TOTAL, FREET4, T3FREE, THYROIDAB in the last 72 hours. Anemia Panel: No results for input(s): VITAMINB12, FOLATE, FERRITIN, TIBC, IRON, RETICCTPCT in the last 72 hours. Sepsis Labs: No results for input(s): PROCALCITON, LATICACIDVEN in the last 168 hours.  Recent Results (from the past 240 hour(s))  MRSA PCR Screening     Status: None   Collection Time: 04/05/18  1:14 PM  Result Value Ref Range Status   MRSA by PCR NEGATIVE NEGATIVE Final    Comment:        The GeneXpert MRSA Assay (FDA approved for NASAL specimens only), is one component of a comprehensive MRSA colonization surveillance program. It is not intended to diagnose MRSA infection nor to guide or monitor treatment for MRSA infections. Performed at [facility], [place] [address] [place], [place] [address]   Culture, blood (routine x 2)     Status: None   Collection Time: 04/06/18 10:19 PM  Result Value Ref Range Status   Specimen Description BLOOD LEFT HAND  Final   Special Requests   Final    BOTTLES DRAWN AEROBIC AND ANAEROBIC Blood Culture adequate volume   Culture   Final    NO GROWTH 5 DAYS Performed at [facility], [place] [address] [place], [place] [address]    Report Status 04/11/2018 FINAL  Final  Culture, blood (routine x 2)     Status: None   Collection Time: 04/06/18 10:37 PM  Result Value Ref Range Status   Specimen Description BLOOD LEFT WRIST  Final   Special Requests   Final    BOTTLES DRAWN AEROBIC AND ANAEROBIC Blood Culture adequate volume   Culture   Final    NO GROWTH 5 DAYS Performed at [facility], [place] [address], [place], [place] [address]    Report Status 04/11/2018 FINAL  Final  Culture, respiratory (NON-Expectorated)     Status: None   Collection Time: 04/06/18 11:41 PM  Result Value Ref Range Status   Specimen Description TRACHEAL ASPIRATE  Final   Special Requests NONE  Final   Gram Stain    Final    ABUNDANT WBC PRESENT, PREDOMINANTLY PMN NO SQUAMOUS EPITHELIAL CELLS SEEN ABUNDANT GRAM POSITIVE COCCI IN PAIRS ABUNDANT GRAM NEGATIVE RODS RARE GRAM POSITIVE RODS    Culture   Final    Consistent with normal respiratory flora. Performed at [facility], [place] [address], [place], [place] [address]    Report Status 04/09/2018 FINAL  Final         Radiology Studies: No results found.      Scheduled Meds: .  stroke: mapping our early stages of recovery book   Does not apply Once  . amiodarone  200 mg Oral Daily  . amLODipine  10 mg Oral Daily  . feeding supplement (ENSURE ENLIVE)  237 mL Oral BID BM  . insulin aspart  0-5 Units Subcutaneous QHS  . insulin aspart  0-9 Units Subcutaneous TID WC  . levETIRAcetam  500 mg Oral BID  . levothyroxine  50 mcg Oral QAC breakfast  . losartan  50 mg Oral BID  . metoprolol tartrate  100 mg Oral BID  . pantoprazole  40 mg Oral Daily  . pneumococcal 23 valent vaccine  0.5 mL Intramuscular Tomorrow-1000  .  senna  1 tablet Oral Daily  . tamsulosin  0.4 mg Oral Daily   Continuous Infusions:   LOS: 10 days    Time spent: 35 mins.More than 50% of that time was spent in counseling and/or coordination of care.      Shelly Coss, MD [facility] Pager [phone]  If 7PM-7AM, please contact night-coverage [email or web address] Password [email or web address] 04/15/2018, 11:01 AM

## 2018-04-15 NOTE — Progress Notes (Signed)
Physical Therapy Treatment Patient Details Name: Hector Pugh MRN: [id number] DOB: 08-22-1961 Today's Date: 04/15/2018    History of Present Illness This 57 y.o. male admitted with c/o HA, vomiting, AMS, and aphasia.  CT of head showed large temporal hemorrhage.  He underwent emergent crani for evacuation.   PMH includes:  pancreatic CA, s/p whipple procedure and subsequent DM.  HTN,    PT Comments    Pt participate, but loses focus quickly.  Pt needing redirection cues often.  Emphasis on switching up gait and ADL tasks with cuing to follow.  Ready for CIR  Follow Up Recommendations  CIR     Equipment Recommendations  Other (comment)(TBA)    Recommendations for Other Services Rehab consult     Precautions / Restrictions Precautions Precautions: Fall Precaution Comments: inattentive R side, word finding deficits and often needing translation from family    Mobility  Bed Mobility Overal bed mobility: Needs Assistance Bed Mobility: Supine to Sit     Supine to sit: Min assist     General bed mobility comments: pt requires incr time and effort to progress to EOB sitting  Transfers Overall transfer level: Needs assistance Equipment used: Rolling walker (2 wheeled) Transfers: Sit to/from Stand Sit to Stand: Min assist         General transfer comment: pt with RW and cues for hand placement on chair. pt requires hand over hand to place hands properly due to decr initiation with verbal cues. pt states "oh" in response to tactile input  Ambulation/Gait Ambulation/Gait assistance: Min assist;+2 safety/equipment Ambulation Distance (Feet): 120 Feet Assistive device: Rolling walker (2 wheeled) Gait Pattern/deviations: Step-through pattern Gait velocity: decreased   General Gait Details: cues for upright posture, facilitation of right hip forward translation and stability of right hip drop.  The majority pt's gait deviations lessen when pt maintains attention to task.  pt  needs directional cues,, frequent redirection   Stairs             Wheelchair Mobility    Modified Rankin (Stroke Patients Only) Modified Rankin (Stroke Patients Only) Modified Rankin: Moderately severe disability     Balance Overall balance assessment: Needs assistance Sitting-balance support: Feet supported Sitting balance-Leahy Scale: Fair Sitting balance - Comments: static sitting with LOB with LB dressing posteriorly with mod (A) to correct   Standing balance support: Single extremity supported Standing balance-Leahy Scale: Fair Standing balance comment: pt requires UE support and min (A) for dynamic balance task like putting paper towel in trash                            Cognition Arousal/Alertness: Awake/alert Behavior During Therapy: WFL for tasks assessed/performed Overall Cognitive Status: Impaired/Different from baseline Area of Impairment: Attention;Following commands;Safety/judgement                   Current Attention Level: Selective   Following Commands: Follows one step commands with increased time;Follows multi-step commands inconsistently Safety/Judgement: Decreased awareness of safety;Decreased awareness of deficits Awareness: Intellectual Problem Solving: Slow processing;Decreased initiation;Difficulty sequencing General Comments: pt answering questions in very fatigue responses "you know the thing" pt unable to find words to describe or giving a generalized answer that does not answer the question. pt with decr response to english commands compared to translated commands by daughter. Daughter is RN for [facility] and cued to only translate directly to allow patient to problem solve. Pt attempting to use soap and told "THIS IS  SOAP" as mouth wash. Soap poured into hand and told "soap" and attempting to bring hand to mouth. OT then hand over hand to wash hands and patient says "OH" and then delayed response to turn on water      Exercises       General Comments General comments (skin integrity, edema, etc.): pt still not assimilating commands/direction in [place] well, but always responds in [place].  Noted to be incontinent on arrival, but was not aware.      Pertinent Vitals/Pain Pain Assessment: No/denies pain    Home Living                      Prior Function            PT Goals (current goals can now be found in the care plan section) Acute Rehab PT Goals Patient Stated Goal: to go to rehab today per daughter. PT Goal Formulation: With patient/family Time For Goal Achievement: 04/22/18 Potential to Achieve Goals: Good Progress towards PT goals: Progressing toward goals    Frequency    Min 4X/week      PT Plan Current plan remains appropriate    Co-evaluation   Reason for Co-Treatment: Complexity of the patient's impairments (multi-system involvement) PT goals addressed during session: Mobility/safety with mobility OT goals addressed during session: ADL's and self-care      AM-PAC PT "6 Clicks" Daily Activity  Outcome Measure  Difficulty turning over in bed (including adjusting bedclothes, sheets and blankets)?: A Little Difficulty moving from lying on back to sitting on the side of the bed? : A Little Difficulty sitting down on and standing up from a chair with arms (e.g., wheelchair, bedside commode, etc,.)?: Unable Help needed moving to and from a bed to chair (including a wheelchair)?: A Little Help needed walking in hospital room?: A Little Help needed climbing 3-5 steps with a railing? : A Little 6 Click Score: 16    End of Session   Activity Tolerance: Patient tolerated treatment well Patient left: in chair;with call bell/phone within reach;with family/visitor present Nurse Communication: Mobility status PT Visit Diagnosis: Hemiplegia and hemiparesis;Other symptoms and signs involving the nervous system (R29.898) Hemiplegia - Right/Left: Right Hemiplegia -  dominant/non-dominant: Dominant Hemiplegia - caused by: Nontraumatic intracerebral hemorrhage     Time: 1000-1035 PT Time Calculation (min) (ACUTE ONLY): 35 min  Charges:  $Gait Training: 8-22 mins                    G Codes:       04/27/2018  Donnella Sham, PT [phone]  (pager)   Hector Pugh 2018/04/27, 3:08 PM

## 2018-04-15 NOTE — Progress Notes (Signed)
Physical Medicine and Rehabilitation Consult Reason for Consult: Decreased functional mobility with right-sided weakness and aphasia Referring Physician: Dr.Xu   HPI: Hector Pugh is a 57 y.o. right-handed male with history of diabetes mellitus, hypertension, pancreatic cancer status post biliary stent as well as Whipple procedure 2014.  Per chart review and family, patient lives with spouse.  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  Multilevel home with 2 steps to entry.  Presented 04/05/2018 with right sided weakness and aphasia.  CT of the head reviewed, showing large left temporal hemorrhage.  Per report, large hemorrhage in the left temporal lobe measuring 6.6 x 3.8 cm.  Surrounding hypodense edema with moderate mass-effect.  CT angiogram of the head with no underlying vascular anomaly.  UDS positive benzos.  Underwent left temporal craniotomy for hematoma evacuation 04/05/2018 per Dr. Christella Noa.  Keppra added for seizure prophylaxis.  Cleviprex for blood pressure control.  Currently on mechanical soft diet.  Latest follow-up CT of the head 04/09/2018 stable no acute changes.  Physical therapy evaluation completed with recommendations of physical medicine rehab consult.  Review of Systems  Unable to perform ROS: Mental acuity       Past Medical History:  Diagnosis Date  . 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 Cancer ([place])   . Diabetes mellitus ([place]) 11/10/2012  . Diabetes mellitus without complication (HCC)    diet controlled  . HTN (hypertension) 11/10/2012  . Hypertension   . 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 Pancreatic cancer ([place])   . Pancreatitis 11/10/2012  . Seasonal allergies         Past Surgical History:  Procedure Laterality Date  . circucision    . circumsision    . COLONOSCOPY N/A 04/04/2018   Procedure: COLONOSCOPY;  Surgeon: Leighton Ruff, MD;  Location: [facility];  Service: Endoscopy;  Laterality: N/A;    . CRANIOTOMY Left 04/05/2018   Procedure: Left Temporal CRANIOTOMY HEMATOMA EVACUATION of Intracranial Hemorrhage;  Surgeon: Ashok Pall, MD;  Location: [place];  Service: Neurosurgery;  Laterality: Left;  . ERCP  11/08/2012   Procedure: ENDOSCOPIC RETROGRADE CHOLANGIOPANCREATOGRAPHY (ERCP);  Surgeon: Jeryl Columbia, MD;  Location: [facility];  Service: Gastroenterology;  Laterality: N/A;  . EUS  11/08/2012   Procedure: UPPER ENDOSCOPIC ULTRASOUND (EUS) LINEAR;  Surgeon: Arta Silence, MD;  Location: [facility];  Service: Endoscopy;  Laterality: N/A;  . FINE NEEDLE ASPIRATION  11/08/2012   Procedure: FINE NEEDLE ASPIRATION (FNA) LINEAR;  Surgeon: Arta Silence, MD;  [facility]: [facility];  Service: Endoscopy;  Laterality: N/A;  . HERNIA REPAIR     RIGHT  . LAPAROSCOPY  12/12/2012   Procedure: LAPAROSCOPY DIAGNOSTIC;  Surgeon: Stark Klein, MD;  Location: [facility];  Service: General;  Laterality: N/A;  . Left Foot Surgery    . Left Foot Surgery    . VASECTOMY    . WHIPPLE PROCEDURE  12/12/2012   Procedure: WHIPPLE PROCEDURE;  Surgeon: Stark Klein, MD;  Location: [facility];  Service: General;  Laterality: N/A;        Family History  Problem Relation Age of Onset  . Cancer Mother        esophageal carcinoma   Social History:  reports that he has never smoked. He has never used smokeless tobacco. He reports that he does not drink alcohol or use drugs. Allergies: No Known Allergies       Medications Prior to Admission  Medication Sig Dispense Refill  . acetaminophen (TYLENOL)  500 MG tablet Take 500 mg by mouth every 4 (four) hours as needed for moderate pain or headache.     Marland Kitchen glipiZIDE (GLUCOTROL XL) 5 MG 24 hr tablet Take 5 mg by mouth daily with breakfast.     . levothyroxine (SYNTHROID, LEVOTHROID) 50 MCG tablet Take 50 mcg by mouth daily before breakfast.    . losartan (COZAAR) 50 MG tablet Take 50 mg by mouth daily.    . tamsulosin (FLOMAX) 0.4 MG CAPS  capsule Take 0.4 mg by mouth daily.  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  He has two MS degrees  Functional Status:  Mobility: Bed Mobility Overal bed mobility: Needs Assistance Bed Mobility: Supine to Sit Supine to sit: Mod assist General bed mobility comments: assist to initiate moving LEs off bed and to scoot to EOB  Transfers Overall transfer level: Needs assistance Transfers: Sit to/from Stand, Stand Pivot Transfers Sit to Stand: Min assist, +2 physical assistance, +2 safety/equipment Stand pivot transfers: Mod assist, +2 physical assistance, +2 safety/equipment General transfer comment: Pt requires assist to steady - demonstrates posterior bias when standing.  He requires assist/facilitation to weight shift and to advance Rt LE as well as facilitation to maintain Lt  hip extension when advancing Rt LE  Ambulation/Gait Ambulation/Gait assistance: Max assist, +2 physical assistance, +2 safety/equipment(Max with fatigue after 20 ft) Ambulation Distance (Feet): 40 Feet Assistive device: 2 person hand held assist(wrap around support with gait belt) Gait Pattern/deviations: Step-through pattern, Decreased stride length, Scissoring, Trunk flexed, Leaning posteriorly General Gait Details: patient with noted RLE internal rotation during loading response, manual faciliation of RLE stride required. Patient with some synergistic  movement during swing phase limited ability for secone placement prior to loading response. Bilateral flexion hip/knees noted (increased with fatigue). 2 person wrap around support required Gait velocity: decreased Gait velocity interpretation: <1.31 ft/sec, indicative of household ambulator  ADL: ADL Overall ADL's : Needs assistance/impaired Eating/Feeding: Set up, Supervision/ safety, Sitting Eating/Feeding Details (indicate cue type and reason): Pt primarily uses Lt UE.  Per daughter, in their culture, people always feed themselves with their Rt UE  Grooming: Wash/dry hands, Wash/dry face, Minimal assistance, Sitting Upper Body Bathing: Moderate assistance, Sitting Lower Body Bathing: Maximal assistance, Sit to/from stand Upper Body Dressing : Maximal assistance, Sitting Lower Body Dressing: Maximal assistance, Sit to/from stand Toilet Transfer: +2 for safety/equipment, +2 for physical assistance, Moderate assistance Toilet Transfer Details (indicate cue type and reason): assist for balance and to facillitate weight shift  Toileting- Clothing Manipulation and Hygiene: Maximal assistance, Sit to/from stand Functional mobility during ADLs: Moderate assistance, +2 for physical assistance, +2 for safety/equipment  Cognition: Cognition Overall Cognitive Status: Impaired/Different from baseline Arousal/Alertness: Lethargic Orientation Level: Oriented to person, Other (comment)(unable to answer orientation questions due to aphasia) Attention: Focused, Sustained Focused Attention: Appears intact Sustained Attention: Impaired Sustained Attention Impairment: Verbal basic, Functional basic Problem Solving: Impaired Problem Solving Impairment: Functional basic Cognition Arousal/Alertness: Awake/alert, Lethargic Behavior During Therapy: Flat affect, Impulsive Overall Cognitive Status: Impaired/Different from baseline Area of Impairment:  Attention, Awareness, Problem solving Current  Attention Level: Sustained, Selective Problem Solving: Slow processing, Decreased initiation, Requires verbal cues General Comments: Pt with Rt inattention. mildly impulsive.  Communication deficits make accurate assessment of cognition difficult  Difficult to assess due to: Impaired communication  Blood pressure 135/76, pulse (!) 102, temperature 98.8 F (37.1 C), temperature source Axillary, resp. rate 18, height 5\' 6"  (1.676 m), weight 72.5 kg (159 lb 13.3 oz), SpO2 100 %. Physical Exam  Vitals reviewed. Constitutional: He appears well-developed and well-nourished.  HENT:  Surgical incision with staples  Eyes: Right eye exhibits no discharge. Left eye exhibits no discharge.  Left gaze preference  Neck: Normal range of motion. Neck supple.  Cardiovascular: Regular rhythm.  +Tachycardia  Respiratory: Effort normal and breath sounds normal.  GI: Soft. Bowel sounds are normal.  Musculoskeletal:  RUE edema  Neurological:  Lethargic A&Ox1 Facial weakness Exam limited due to ability to participate, but >/3/5 in LUE and B/l LE LUE: 0/5  Skin:  Incision c/d/i  Psychiatric: His affect is blunt. His speech is delayed and slurred. He is slowed. Cognition and memory are impaired.    LabResultsLast24Hours       Results for orders placed or performed during the hospital encounter of 04/05/18 (from the past 24 hour(s))  Glucose, capillary     Status: Abnormal   Collection Time: 04/08/18 11:08 AM  Result Value Ref Range   Glucose-Capillary 159 (H) 65 - 99 mg/dL  Glucose, capillary     Status: Abnormal   Collection Time: 04/08/18  4:04 PM  Result Value Ref Range   Glucose-Capillary 125 (H) 65 - 99 mg/dL  Sodium     Status: Abnormal   Collection Time: 04/08/18  4:26 PM  Result Value Ref Range   Sodium 151 (H) 135 - 145 mmol/L  Glucose, capillary     Status: Abnormal   Collection Time: 04/08/18  8:05 PM  Result Value Ref Range   Glucose-Capillary 147 (H) 65 - 99 mg/dL    Sodium     Status: Abnormal   Collection Time: 04/08/18 10:34 PM  Result Value Ref Range   Sodium 149 (H) 135 - 145 mmol/L  Glucose, capillary     Status: Abnormal   Collection Time: 04/08/18 10:47 PM  Result Value Ref Range   Glucose-Capillary 199 (H) 65 - 99 mg/dL  CBC     Status: Abnormal   Collection Time: 04/09/18  4:26 AM  Result Value Ref Range   WBC 7.1 4.0 - 10.5 K/uL   RBC 3.77 (L) 4.22 - 5.81 MIL/uL   Hemoglobin 10.5 (L) 13.0 - 17.0 g/dL   HCT 32.8 (L) 39.0 - 52.0 %   MCV 87.0 78.0 - 100.0 fL   MCH 27.9 26.0 - 34.0 pg   MCHC 32.0 30.0 - 36.0 g/dL   RDW 13.3 11.5 - 15.5 %   Platelets 145 (L) 150 - 400 K/uL  Basic metabolic panel     Status: Abnormal   Collection Time: 04/09/18  4:26 AM  Result Value Ref Range   Sodium 149 (H) 135 - 145 mmol/L   Potassium 3.0 (L) 3.5 - 5.1 mmol/L   Chloride 117 (H) 101 - 111 mmol/L   CO2 23 22 - 32 mmol/L   Glucose, Bld 196 (H) 65 - 99 mg/dL   BUN 12 6 - 20 mg/dL   Creatinine, Ser 0.83 0.61 - 1.24 mg/dL   Calcium 7.9 (L) 8.9 - 10.3 mg/dL   GFR calc non Af  Amer >60 >60 mL/min   GFR calc Af Amer >60 >60 mL/min   Anion gap 9 5 - 15  Phosphorus     Status: Abnormal   Collection Time: 04/09/18  4:26 AM  Result Value Ref Range   Phosphorus 1.8 (L) 2.5 - 4.6 mg/dL  Glucose, capillary     Status: Abnormal   Collection Time: 04/09/18  7:33 AM  Result Value Ref Range   Glucose-Capillary 177 (H) 65 - 99 mg/dL      ImagingResults(Last48hours)  Ct Head Wo Contrast  Result Date: 04/09/2018 CLINICAL DATA:  57 y/o M; follow-up of intracranial hemorrhage. History of pancreatic cancer. EXAM: CT HEAD WITHOUT CONTRAST TECHNIQUE: Contiguous axial images were obtained from the base of the skull through the vertex without intravenous contrast. COMPARISON:  04/07/2018 CT head. FINDINGS: Brain: Stable hematoma within the left temporal lobe post partial evacuation. Stable small volume of intraventricular hemorrhage.  Stable mass effect with 7 mm of left-to-right midline shift. Stable small volume of subarachnoid hemorrhage over the left convexity in the area of evacuation. Stable small area of infarction within the left posterosuperior temporal lobe. No new acute intracranial hemorrhage, stroke, or focal mass effect of the brain. No herniation. Vascular: No hyperdense vessel or unexpected calcification. Skull: Stable postsurgical changes related to a left lateral craniotomy with edema in the overlying scalp. Sinuses/Orbits: No acute finding. Other: None. IMPRESSION: 1. Stable left temporal lobe hemorrhage, 5 mm left-to-right midline shift, small volume subarachnoid hemorrhage, and small volume intraventricular hemorrhage. 2. No new acute intracranial abnormality. Electronically Signed   By: Kristine Garbe M.D.   On: 04/09/2018 04:38   Ct Head Wo Contrast  Result Date: 04/07/2018 CLINICAL DATA:  Left temporal hemorrhage. Status post evacuation of hemorrhage 2 days ago. EXAM: CT HEAD WITHOUT CONTRAST TECHNIQUE: Contiguous axial images were obtained from the base of the skull through the vertex without intravenous contrast. COMPARISON:  None. FINDINGS: Brain: Residual blood within the left temporal lobe is again noted. The hyperdense component is stable to slightly decreased compared to the prior exam. Surrounding edema is unchanged. Intraventricular hemorrhage is again noted. There is no hydrocephalus. Midline shift is slightly decreased, now measuring 5 mm just above the foramen of [place]ro. No new hemorrhage is present. Minimal subarachnoid hemorrhage on the left is stable. The brainstem and cerebellum are normal. Vascular: No hyperdense vessel is present. Skull: Left craniotomy is again noted. Calvarium is otherwise intact. Sinuses/Orbits: The paranasal sinuses and mastoid air cells are clear. The patient is intubated. Globes and orbits are within normal limits. IMPRESSION: 1. Stable to slight decrease in size of  residual blood products in the left temporal lobe following partial vacuo a shin. 2. Slight decrease in midline shift. 3. Stable interventricular hemorrhage without hydrocephalus. These results were called by telephone at the time of interpretation on 04/07/2018 at 2:18 PM to Dr. Carlyon Prows , who verbally acknowledged these results. Electronically Signed   By: San Morelle M.D.   On: 04/07/2018 15:52   Dg Chest Port 1 View  Result Date: 04/09/2018 CLINICAL DATA:  Status post CVA EXAM: PORTABLE CHEST 1 VIEW COMPARISON:  Portable chest x-ray of Apr 07, 2018 FINDINGS: The lungs are mildly hypoinflated since extubation of the trachea. There is linear atelectasis at the left base. The heart and pulmonary vascularity are normal. The PICC line tip projects over the cavoatrial junction. There is no pleural effusion. There is contrast within the splenic flexure of the colon. The observed bony structures are unremarkable.  IMPRESSION: Minimal left basilar atelectasis since extubation. No alveolar pneumonia nor CHF. Electronically Signed   By: David  Martinique M.D.   On: 04/09/2018 09:44   Dg Swallowing Func-speech Pathology  Result Date: 04/08/2018 Objective Swallowing Evaluation: Type of Study: MBS-Modified Barium Swallow Study  Patient Details Name: Hector Pugh MRN: [id number] Date of Birth: 01/04/1961 Today's Date: 04/08/2018 Time: SLP Start Time (ACUTE ONLY): 1024 -SLP Stop Time (ACUTE ONLY): 1038 SLP Time Calculation (min) (ACUTE ONLY): 14 min Past Medical History: Past Medical History: Diagnosis Date . 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 Cancer ([place])  . Diabetes mellitus ([place]) 11/10/2012 . Diabetes mellitus without complication (HCC)   diet controlled . HTN (hypertension) 11/10/2012 . Hypertension  . Obstructive jaundice 11/11/2012  With ampullary mass . Pancreatic cancer ([place])  . Pancreatitis 11/10/2012 . Seasonal allergies  Past Surgical History: Past Surgical History: Procedure Laterality  Date . circucision   . circumsision   . COLONOSCOPY N/A 04/04/2018  Procedure: COLONOSCOPY;  Surgeon: Leighton Ruff, MD;  [facility];  Service: Endoscopy;  Laterality: N/A; . ERCP  11/08/2012  Procedure: ENDOSCOPIC RETROGRADE CHOLANGIOPANCREATOGRAPHY (ERCP);  Surgeon: Jeryl Columbia, MD;  Location: [facility];  Service: Gastroenterology;  Laterality: N/A; . EUS  11/08/2012  Procedure: UPPER ENDOSCOPIC ULTRASOUND (EUS) LINEAR;  Surgeon: Arta Silence, MD;  [facility];  Service: Endoscopy;  Laterality: N/A; . FINE NEEDLE ASPIRATION  11/08/2012  Procedure: FINE NEEDLE ASPIRATION (FNA) LINEAR;  Surgeon: Arta Silence, MD;  [facility];  Service: Endoscopy;  Laterality: N/A; . HERNIA REPAIR    RIGHT . LAPAROSCOPY  12/12/2012  Procedure: LAPAROSCOPY DIAGNOSTIC;  Surgeon: Stark Klein, MD;  [facility];  Service: General;  Laterality: N/A; . Left Foot Surgery   . Left Foot Surgery   . VASECTOMY   . WHIPPLE PROCEDURE  12/12/2012  Procedure: WHIPPLE PROCEDURE;  Surgeon: Stark Klein, MD;  [facility];  Service: General;  Laterality: N/A; HPI: [name] is a 57 y.o. male with history of pancreatic endocrine tumor s/p wipple procedure in 2012, DM, HTN admitted for slurred speech, N/V and right sided weakness. Dx with left temporal ICH s/p hematoma evacuation 5/3. Remained intubated and on the vent until 5/5.  No data recorded Assessment / Plan / Recommendation CHL IP CLINICAL IMPRESSIONS 04/08/2018 Clinical Impression Patient presents with a mild oropharyngeal dysphagia characterized by right sided facial and lingual weakness resulting in mildly prolonged oral transit of bolus, however functional, and mild vallecular residuals which are cleared with subsequent swallow. Full airway protection noted. Important to note intermittent throat clearing with eventual cough despite full airway protection observed.  SLP Visit Diagnosis Dysphagia, oropharyngeal phase (R13.12) Attention and  concentration deficit following -- Frontal lobe and executive function deficit following -- Impact on safety and function Mild aspiration risk   CHL IP TREATMENT RECOMMENDATION 04/08/2018 Treatment Recommendations Therapy as outlined in treatment plan below   Prognosis 04/08/2018 Prognosis for Safe Diet Advancement Good Barriers to Reach Goals -- Barriers/Prognosis Comment -- CHL IP DIET RECOMMENDATION 04/08/2018 SLP Diet Recommendations Dysphagia 3 (Mech soft) solids;Thin liquid Liquid Administration via Cup;Straw Medication Administration Whole meds with liquid Compensations Slow rate;Lingual sweep for clearance of pocketing;Small sips/bites Postural Changes Seated upright at 90 degrees   CHL IP OTHER RECOMMENDATIONS 04/08/2018 Recommended Consults -- Oral Care Recommendations Oral care BID Other Recommendations --   CHL IP FOLLOW UP RECOMMENDATIONS 04/08/2018 Follow up Recommendations Inpatient Rehab   CHL IP FREQUENCY AND DURATION 04/08/2018 Speech Therapy Frequency (  ACUTE ONLY) min 2x/week Treatment Duration 2 weeks      CHL IP ORAL PHASE 04/08/2018 Oral Phase Impaired Oral - Pudding Teaspoon -- Oral - Pudding Cup -- Oral - Honey Teaspoon -- Oral - Honey Cup -- Oral - Nectar Teaspoon -- Oral - Nectar Cup -- Oral - Nectar Straw -- Oral - Thin Teaspoon -- Oral - Thin Cup -- Oral - Thin Straw -- Oral - Puree -- Oral - Mech Soft Lingual/palatal residue;Delayed oral transit Oral - Regular -- Oral - Multi-Consistency -- Oral - Pill -- Oral Phase - Comment --  CHL IP PHARYNGEAL PHASE 04/08/2018 Pharyngeal Phase Impaired Pharyngeal- Pudding Teaspoon -- Pharyngeal -- Pharyngeal- Pudding Cup -- Pharyngeal -- Pharyngeal- Honey Teaspoon -- Pharyngeal -- Pharyngeal- Honey Cup -- Pharyngeal -- Pharyngeal- Nectar Teaspoon -- Pharyngeal -- Pharyngeal- Nectar Cup Reduced tongue base retraction;Pharyngeal residue - valleculae Pharyngeal -- Pharyngeal- Nectar Straw -- Pharyngeal -- Pharyngeal- Thin Teaspoon -- Pharyngeal -- Pharyngeal- Thin Cup  Reduced tongue base retraction;Pharyngeal residue - valleculae Pharyngeal -- Pharyngeal- Thin Straw Reduced tongue base retraction;Pharyngeal residue - valleculae Pharyngeal -- Pharyngeal- Puree Reduced tongue base retraction;Pharyngeal residue - valleculae Pharyngeal -- Pharyngeal- Mechanical Soft Reduced tongue base retraction;Pharyngeal residue - valleculae Pharyngeal -- Pharyngeal- Regular -- Pharyngeal -- Pharyngeal- Multi-consistency -- Pharyngeal -- Pharyngeal- Pill WFL Pharyngeal -- Pharyngeal Comment --  Gabriel Rainwater MA, CCC-SLP [phone] [place] Leah Meryl 04/08/2018, 11:57 AM                Assessment/Plan: Diagnosis: large hemorrhage in the left temporal lobe Labs and images independently reviewed.  Records reviewed and summated above. Stroke: Continue secondary stroke prophylaxis and Risk Factor Modification listed below:   Blood Pressure Management:  Continue current medication with prn's with permisive HTN per primary team Diabetes management:   Right sided hemiparesis: fit for orthosis to prevent contractures (resting hand splint for day, wrist cock up splint at night, PRAFO, etc) Motor recovery: Fluoxetine  1. Does the need for close, 24 hr/day medical supervision in concert with the patient's rehab needs make it unreasonable for this patient to be served in a less intensive setting? Yes  2. Co-Morbidities requiring supervision/potential complications: diabetes mellitus (Monitor in accordance with exercise and adjust meds as necessary), HTN (monitor and provide prns in accordance with increased physical exertion and pain, wean infusion and IV meds when appropriate), pancreatic cancer status post biliary stent, FUO (cont recs per Neuro, cont abx), dysphagia (advance diet as tolerated), Tachycardia (monitor in accordance with pain and increasing activity), tachypnea (monitor RR and O2 Sats with increased physical exertion), hypothyroidism (cont meds, ensure appropriate mood and energy  level for therapies), hypernatremia (cont to monitor, wean 3% Na when appropriate), hypokalemia (continue to monitor and replete as necessary), ABLA (transfuse if necessary to ensure appropriate perfusion for increased activity tolerance), Thrombocytopenia (< 60,000/mm3 no resistive exercise) 3. Due to bladder management, bowel management, safety, skin/wound care, disease management, medication administration, pain management and patient education, does the patient require 24 hr/day rehab nursing? Yes 4. Does the patient require coordinated care of a physician, rehab nurse, PT (1-2 hrs/day, 5 days/week), OT (1-2 hrs/day, 5 days/week) and SLP (1-2 hrs/day, 5 days/week) to address physical and functional deficits in the context of the above medical diagnosis(es)? Yes Addressing deficits in the following areas: balance, endurance, locomotion, strength, transferring, bowel/bladder control, bathing, dressing, feeding, grooming, toileting, cognition, speech, language, swallowing and psychosocial support 5. Can the patient actively participate in an intensive therapy program of at least 3 hrs  of therapy per day at least 5 days per week? In the near future 6. The potential for patient to make measurable gains while on inpatient rehab is excellent 7. Anticipated functional outcomes upon discharge from inpatient rehab are supervision and min assist  with PT, supervision and min assist with OT, supervision with SLP. 8. Estimated rehab length of stay to reach the above functional goals is: 20-25 days. 9. Anticipated D/C setting: Home 10. Anticipated post D/C treatments: HH therapy and Home excercise program 11. Overall Rehab/Functional Prognosis: good  RECOMMENDATIONS: This patient's condition is appropriate for continued rehabilitative care in the following setting: CIR when medically stable and able to tolerate 3 hours of therapy/day. Patient has agreed to participate in recommended program. Potentially Note  that insurance prior authorization may be required for reimbursement for recommended care.  Comment: Rehab Admissions Coordinator to follow up.   I have personally performed a face to face diagnostic evaluation, including, but not limited to relevant history and physical exam findings, of this patient and developed relevant assessment and plan.  Additionally, I have reviewed and concur with the physician assistant's documentation above.   Delice Lesch, MD, ABPMR Lavon Paganini Angiulli, PA-C 04/09/2018          Revision History                        Routing History

## 2018-04-15 NOTE — Progress Notes (Signed)
Spoke with pt and his daughter at the bedside. Pt has received medical readiness and both pt and family in agreement to CIR today. Bed available and pt will be admitted today. Please call for questions: [phone]  Jhonnie Garner, OTR/L  Rehab Admissions Coordinator  [phone] 04/15/2018 1:12 PM

## 2018-04-15 NOTE — Discharge Summary (Addendum)
Stroke Discharge Summary  Patient ID: Hector Pugh   MRN: [id number]      DOB: [birth year]  Date of Admission: 04/05/2018 Date of Discharge: 04/15/2018  Attending Physician:  Garvin Fila, MD, Stroke MD Consultant(s):  Ashok Pall, MD (neurosurgery), Delice Lesch, MD (Physical Medicine & Rehabtilitation), [facility] Patient's PCP:  [name], MD  Discharge Diagnoses:  Principal Problem:   Intracranial hemorrhage (HCC)-left temporal etiology hypertensive likely. Status post craniotomy for evacuation Active Problems:   Pneumonia   Diabetes mellitus type 2 in nonobese (HCC)   Benign essential HTN   History of pancreatic cancer   FUO (fever of unknown origin)   Dysphagia, post-stroke   Tachycardia   Tachypnea   Hypothyroidism   Hypernatremia   Hypokalemia   Acute blood loss anemia   Thrombocytopenia (HCC)  Past Medical History:  Diagnosis Date  . 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 Cancer ([place])   . Diabetes mellitus ([place]) 11/10/2012  . Diabetes mellitus without complication (HCC)    diet controlled  . HTN (hypertension) 11/10/2012  . Hypertension   . 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 Pancreatic cancer ([place])   . Pancreatitis 11/10/2012  . Seasonal allergies    Past Surgical History:  Procedure Laterality Date  . circucision    . circumsision    . COLONOSCOPY N/A 04/04/2018   Procedure: COLONOSCOPY;  Surgeon: Leighton Ruff, MD;  Location: [facility];  Service: Endoscopy;  Laterality: N/A;  . CRANIOTOMY Left 04/05/2018   Procedure: Left Temporal CRANIOTOMY HEMATOMA EVACUATION of Intracranial Hemorrhage;  Surgeon: Ashok Pall, MD;  Location: [facility];  Service: Neurosurgery;  Laterality: Left;  . ERCP  11/08/2012   Procedure: ENDOSCOPIC RETROGRADE CHOLANGIOPANCREATOGRAPHY (ERCP);  Surgeon: Jeryl Columbia, MD;  Location: [facility];  Service: Gastroenterology;  Laterality: N/A;  . EUS  11/08/2012   Procedure:  UPPER ENDOSCOPIC ULTRASOUND (EUS) LINEAR;  Surgeon: Arta Silence, MD;  Location: [facility];  Service: Endoscopy;  Laterality: N/A;  . FINE NEEDLE ASPIRATION  11/08/2012   Procedure: FINE NEEDLE ASPIRATION (FNA) LINEAR;  Surgeon: Arta Silence, MD;  Location: [facility];  Service: Endoscopy;  Laterality: N/A;  . HERNIA REPAIR     RIGHT  . LAPAROSCOPY  12/12/2012   Procedure: LAPAROSCOPY DIAGNOSTIC;  Surgeon: Stark Klein, MD;  Location: [facility];  Service: General;  Laterality: N/A;  . Left Foot Surgery    . Left Foot Surgery    . VASECTOMY    . WHIPPLE PROCEDURE  12/12/2012   Procedure: WHIPPLE PROCEDURE;  Surgeon: Stark Klein, MD;  Location: [facility];  Service: General;  Laterality: N/A;    Medications to be continued on Rehab Allergies as of 04/15/2018   No Known Allergies     Medication List    TAKE these medications   acetaminophen 500 MG tablet Commonly known as:  TYLENOL Take 500 mg by mouth every 4 (four) hours as needed for moderate pain or headache.   amLODipine 10 MG tablet Commonly known as:  NORVASC Take 1 tablet (10 mg total) by mouth daily. Start taking on:  04/16/2018   butalbital-acetaminophen-caffeine 50-325-40 MG tablet Commonly known as:  FIORICET, ESGIC Take 1 tablet by mouth every 8 (eight) hours as needed for headache.   feeding supplement (ENSURE ENLIVE) Liqd Take 237 mLs by mouth 2 (two) times daily between meals.   glipiZIDE 5 MG 24 hr tablet Commonly known as:  GLUCOTROL XL Take  5 mg by mouth daily with breakfast.   insulin aspart 100 UNIT/ML injection Commonly known as:  novoLOG Inject 0-5 Units into the skin at bedtime.   insulin aspart 100 UNIT/ML injection Commonly known as:  novoLOG Inject 0-9 Units into the skin 3 (three) times daily with meals.   levETIRAcetam 500 MG tablet Commonly known as:  KEPPRA Take 1 tablet (500 mg total) by mouth 2 (two) times daily.   levothyroxine 50 MCG tablet Commonly known as:  SYNTHROID,  LEVOTHROID Take 50 mcg by mouth daily before breakfast.   losartan 50 MG tablet Commonly known as:  COZAAR Take 1 tablet (50 mg total) by mouth 2 (two) times daily. What changed:  when to take this   metoprolol tartrate 100 MG tablet Commonly known as:  LOPRESSOR Take 1 tablet (100 mg total) by mouth 2 (two) times daily.   pantoprazole 40 MG tablet Commonly known as:  PROTONIX Take 1 tablet (40 mg total) by mouth daily. Start taking on:  04/16/2018   pneumococcal 23 valent vaccine 25 MCG/0.5ML injection Commonly known as:  PNU-IMMUNE Inject 0.5 mLs into the muscle tomorrow at 10 am for 1 dose.   senna 8.6 MG Tabs tablet Commonly known as:  SENOKOT Take 1 tablet (8.6 mg total) by mouth daily.   tamsulosin 0.4 MG Caps capsule Commonly known as:  FLOMAX Take 0.4 mg by mouth daily.       LABORATORY STUDIES CBC    Component Value Date/Time   WBC 8.8 04/15/2018 0225   RBC 3.52 (L) 04/15/2018 0225   HGB 9.9 (L) 04/15/2018 0225   HCT 29.9 (L) 04/15/2018 0225   PLT 197 04/15/2018 0225   MCV 84.9 04/15/2018 0225   MCH 28.1 04/15/2018 0225   MCHC 33.1 04/15/2018 0225   RDW 13.5 04/15/2018 0225   LYMPHSABS 1.3 04/05/2018 0925   MONOABS 0.8 04/05/2018 0925   EOSABS 0.0 04/05/2018 0925   BASOSABS 0.0 04/05/2018 0925   CMP    Component Value Date/Time   NA 138 04/15/2018 0225   K 3.7 04/15/2018 0225   CL 107 04/15/2018 0225   CO2 23 04/15/2018 0225   GLUCOSE 164 (H) 04/15/2018 0225   BUN 15 04/15/2018 0225   CREATININE 0.90 04/15/2018 0225   CREATININE 0.83 01/22/2014 0001   CALCIUM 8.2 (L) 04/15/2018 0225   PROT 5.5 (L) 04/07/2018 0454   ALBUMIN 3.0 (L) 04/07/2018 0454   AST 16 04/07/2018 0454   ALT 12 (L) 04/07/2018 0454   ALKPHOS 32 (L) 04/07/2018 0454   BILITOT 0.8 04/07/2018 0454   GFRNONAA >60 04/15/2018 0225   GFRAA >60 04/15/2018 0225   COAGS Lab Results  Component Value Date   INR 1.40 04/07/2018   INR 1.18 04/05/2018   INR 1.22 12/13/2012    Lipid Panel    Component Value Date/Time   CHOL 99 04/06/2018 0325   TRIG 149 04/10/2018 0430   HDL 34 (L) 04/06/2018 0325   CHOLHDL 2.9 04/06/2018 0325   VLDL 20 04/06/2018 0325   LDLCALC 45 04/06/2018 0325   HgbA1C  Lab Results  Component Value Date   HGBA1C 6.8 (H) 04/06/2018   Urinalysis    Component Value Date/Time   COLORURINE STRAW (A) 04/11/2018 1445   APPEARANCEUR CLEAR 04/11/2018 1445   LABSPEC 1.010 04/11/2018 1445   PHURINE 7.0 04/11/2018 1445   GLUCOSEU 150 (A) 04/11/2018 1445   HGBUR NEGATIVE 04/11/2018 [place] 04/11/2018 [place] 04/11/2018 1445  PROTEINUR NEGATIVE 04/11/2018 1445   UROBILINOGEN 0.2 06/10/2014 2116   NITRITE NEGATIVE 04/11/2018 1445   LEUKOCYTESUR NEGATIVE 04/11/2018 1445   Urine Drug Screen     Component Value Date/Time   LABOPIA NONE DETECTED 04/06/2018 0504   COCAINSCRNUR NONE DETECTED 04/06/2018 0504   LABBENZ POSITIVE (A) 04/06/2018 0504   AMPHETMU NONE DETECTED 04/06/2018 0504   THCU NONE DETECTED 04/06/2018 0504   LABBARB NONE DETECTED 04/06/2018 0504    Alcohol Level    Component Value Date/Time   ETH <10 04/05/2018 0925     SIGNIFICANT DIAGNOSTIC STUDIES Ct Head Code Stroke Wo Contrast 04/05/2018 0944 1. Large hemorrhage left temporal lobe with surrounding edema and mass-effect. Intraventricular hemorrhage with mild hydrocephalus. 13 mm midline shift. Etiologies of hemorrhage are not determined on this study but could be due to hypertension, cerebral amyloid, vascular malformation, or less likely tumor. 2. ASPECTS is 10   Ct Angio Head W Or Wo Contrast 04/05/2018  1112 Stable LEFT temporal lobe hemorrhage, with LEFT-to-RIGHT midline shift. Lobar hypertensive bleed is favored. No underlying vascular anomaly, or arterial/venous thrombosis/embolus.   Ct Head Wo Contrast 04/05/2018 2228 1. Postsurgical changes from interval partial evacuation of hematoma within the left temporal lobe  with approximately 20 cc residual. 2. Stable small volume of intraventricular hemorrhage and small volume of subarachnoid hemorrhage predominantly over left convexity. 3. Decreased mass effect with 6 mm left-to-right midline shift, previously 13 mm. Improved patency of lateral ventricles. 4. No new acute intracranial abnormality identified.   Ct Head Wo Contrast 04/06/2018 0913 1. Similar postoperative appearance of residual blood products in the left temporal lobe following partial evacuation of the hematoma. 2. Stable intraventricular hemorrhage without hydrocephalus. 3. 6 mm left-to-right midline shift is stable.   Ct Head Wo Contrast 04/07/2018 1552 1. Stable to slight decrease in size of residual blood products in the left temporal lobe following partial vacuo a shin. 2. Slight decrease in midline shift. 3. Stable interventricular hemorrhage without hydrocephalus.   Ct Head Wo Contrast 04/09/2018 0438 1. Stable left temporal lobe hemorrhage, 5 mm left-to-right midline shift, small volume subarachnoid hemorrhage, and small volume intraventricular hemorrhage. 2. No new acute intracranial abnormality.  Ct Head Wo Contrast 04/11/2018 0540 1. Evolving left temporal lobe hemorrhage, similar in size but decreased in attenuation as compared to 04/09/2018, with increased localized vasogenic edema and regional mass effect. Secondary left-to-right shift of 7 mm. 2. Persistent small volume intraventricular hemorrhage, slightly decreased from previous. 3. No other new acute intracranial abnormality.   Ct Head Wo Contrast 04/13/2018  1. Stable left anterior temporal lobe hematoma, small volume intraventricular hemorrhage, and small volume subarachnoid hemorrhage. No new hemorrhage. 2. Stable surrounding edema in the left temporal lobe, associated mass effect, 7 mm left-to-right midline shift, and left uncal Herniation. 3. Stable cerebral edema    TTE  04/07/2018 - Left ventricle: The cavity size was  normal. Wall thickness wasnormal. Systolic function was normal. The estimated ejectionfraction was in the range of 60% to 65%. Wall motion was normal;there were no regional wall motion abnormalities. Leftventricular diastolic function parameters were normal. - Aortic valve: There was mild regurgitation. - Atrial septum: No defect or patent foramen ovale was identified. - Systemic veins: Patient ventilated so unable to accurately assessCVP with IVC assessment.     HISTORY OF PRESENT ILLNESS Eitan Doubleday is a 57 y.o. male with a history of DM, s/p whipple in 2012 who was last known well before he went to bed last night 04/05/2018 around  8:30 PM.  When his wife left for work at [employer] 04/06/2018, she noticed that he was slurring his speech, however he did not have any clear deficits. He went downstairs sometime between 5 and 8:30 AM and started to make tea.  Subsequently when his daughter arrived home about 8:30 AM he was in bed with vomit and not moving his right side as well.  He has brought to the emergency department as a code stroke where CT revealed large temporal hemorrhage. Premorbid modified rankin scale: 0. ICH Score: 0. Dr. Christella Noa, neurosurgeon, consulted and took to the OR for a left temporal craniotomy for hematoma evacuation.  Postop, he was admitted to the neuro intensive care unit.   HOSPITAL COURSE Mr. Puneet Masoner is a 57 y.o. male with history of pancreatic endocrine tumor s/p wipple procedure in 2012, DM, HTN admitted for slurry speech, N/V and right sided weakness. No tPA given due to [place].    He was taken to the OR for a left temporal craniotomy for hematoma evacuation.  Postoperatively he demonstrated hypertension and cerebral edema with herniation.  He remained intubated and sedated in the ICU with 3% saline as well as blood pressure drip.  Treated with Zosyn for possible aspiration pneumonia.  On May 5 he had a tachycardic episode concerning for atrial fibrillation but  EKG/telemetry strips did not confirm.  He was started on amiodarone, which was discontinued at time of discharge after consult with cardiology given no atrial fibrillation.  Treated with Keppra for seizure prophylaxis.  Patient progressed with right hemiparesis and global aphasia.  On day of plan discharge to inpatient rehab he had increasing lethargy but no new neuro symptoms.  He was kept in the ICU and placed back on blood pressure drip and 3% drip.  CT continued to show resolving hemorrhage but still with 7 mm shift and mild uncal herniation.  Lethargy improved, drips were weaned and plans were made for transfer to inpatient rehab.  Stroke:  spontaneous left temporal ICH w/ IVH s/p hematoma evacuation, etiology unclear  Resultant right hemiparesis,  Right hemianopia,  Aphasia improving   CT head left temporal large ICH, with severe midline shift  CTA no AVM or aneurysm  Repeat CT 04/06/18 - stable hematoma s/p evacuation, midline much improved  Repeat CT 04/07/18 stable. Decreased hemorrhage and slight decrease midline shift. Stable IVH. no hydro.   Repeat CT 04/09/18 stable. Decreased shift to 72mm. Small SAH and IVH.   Repeat CT 04/11/18 evolving hmg, decreasing size and attenuation. focal vasogenic edema & mass effect, slightly increased. Small IVH remains.   Repeat CT head 04/13/18 stable no new findings    2D Echo  EF 60-65%. No source of embolus   LDL 45  HgbA1c 6.8  No antithrombotic prior to admission  Continue keppra for seizure prevention, plan for 2 weeks course in total due to craniotomy   Ongoing aggressive stroke risk factor management  Therapy recommendations:  CIR  Cerebral edema Induced hyponatremia  CT 04/05/18 left temporal large ICH with severe midline shift  S/p hematoma evacation  CT repeat 04/06/18 midline shift 5.6mm down to 5.2 on 5/5 scan  CT repeat 04/09/18 midline shift 64mm, small SAH, small IVH, still with mild L sided midbrain compression. started On 3%  saline.  CT head 04/11/18 slightly increased midline shift and focal mass effect. Off 3% and BP drips. Then pt became more lethargic in the afternoon - Na 142 - 3% saline resumed.  CT head 04/13/18 slightly  increased midline shift and focal mass effect.  04/14/2018 off 3% and BP drips  Elevated Temperature  Temp spike 5/9 to 100.9  Temp spike 5/11 to 101.3  CXR and UA negative  PICC line inserted for drips removed 5/12  TMax 99.1 day of d/c  Diabetes  Secondary to Whipple  HgbA1c 6.8 goal < 7.0  Controlled  Resume oral diabetic meds at d/c  Continue CBG monitoring  Hypertension  On both Cleviprex and Cardene during hospitalization in order to keep blood pressure less than 140   SBP goal now < 180  home losartan increased to 50 bid.  Amlodipine 10 daily added  Metoprolol increased to 100 bid - also try to help for tachycardia too stable HR  Atrial tachycardia with PACs - ?? afib with RVR  Hx transient afib post Whipple AF  ICU MD documented repeat onset 04/07/2018 but no strips or EKG to confirm  Increased metoprolol dose to 100mg  bid  Discontinue amiodarone at d/c due to short treatment course. Discussed with cardiology.  OP cardiology followup  Other Active Problems  Pancreatic endocrine tumor s/p wipple procedure in 2013  Hypokalemia, supplement  Hypothyroidism on synthroid  BPH on flomax  Anemia of critical illness   DISCHARGE EXAM Blood pressure 118/78, pulse 69, temperature 98.6 F (37 C), temperature source Oral, resp. rate 17, height 5\' 6"  (1.676 m), weight 72.5 kg (159 lb 13.3 oz), SpO2 100 %. General - Well nourished, well developed, in no distress. Cardiovascular - Regular rhythm Respiratory - clear to auscultation  Skin - L scalp staples CDI, no sign edema Neuro - alert, awake. Knows self, age. Cannot name place. Will follow simple midline commands, complex commands difficult. Perseverates occasionally. Cannot name objects, but able  to repeat simple sentence today. Social speech intact and can communicate needs. Left gaze preference improving and now able to cross midline to the R but right gaze incomplete. No ptosis noted. Right facial droop. Blinks to visual threat on the left, but not on the right. LUE and LLE 5/5. RLE 4/5 and RUE 4+/5 proximal 4/5 distal. DTR 1+ and R babinski. Sensation intact bilaterally. Coordination with mild L orbiting R. Gait not tested.  Discharge Diet  D3 thin liquids  DISCHARGE PLAN  Disposition:  Transfer to [facility] for ongoing PT, OT and ST  SBP goal < 180  Continue Keppra for a total of 2 weeks for seizure prophylaxis  Recommend ongoing risk factor control by Primary Care Physician at time of discharge from inpatient rehabilitation.  Follow-up outpatient cardiology   Follow-up [name], MD in 2 weeks following discharge from rehab.  Follow-up in [facility] in 4 weeks following discharge from rehab, office to schedule an appointment.   45 minutes were spent preparing discharge.  Burnetta Sabin, MSN, APRN, ANVP-BC, AGPCNP-BC Advanced Practice Stroke Nurse [place] for Schedule & Pager information 04/15/2018 1:32 PM  I have personally examined this patient, reviewed notes, independently viewed imaging studies, participated in medical decision making and plan of care.ROS completed by me personally and pertinent positives fully documented  I have made any additions or clarifications directly to the above note. Agree with note above. Discussed with patient and daughter and answered questions  Antony Contras, MD Medical Director [facility] Pager: [phone] 04/15/2018 3:24 PM

## 2018-04-15 NOTE — Progress Notes (Signed)
Occupational Therapy Treatment Patient Details Name: Hector Pugh MRN: [id number] DOB: 1961-04-21 Today's Date: 04/15/2018    History of present illness This 57 y.o. male admitted with c/o HA, vomiting, AMS, and aphasia.  CT of head showed large temporal hemorrhage.  He underwent emergent crani for evacuation.   PMH includes:  pancreatic CA, s/p whipple procedure and subsequent DM.  HTN,   OT comments  Pt completed bed to chair, chair to sink and movement to simulate visual scanning within the home environment with deficits noted. Pt requires cues for all transfers and min (A) for balance. Pt with decr awareness to R visual field. Pt using objects incorrectly during session. Ot to continue to follow acutely.   Follow Up Recommendations  CIR;Supervision/Assistance - 24 hour    Equipment Recommendations  None recommended by OT    Recommendations for Other Services Rehab consult    Precautions / Restrictions Precautions Precautions: Fall Precaution Comments: inattentive R side, word finding deficits and often needing translation from family       Mobility Bed Mobility Overal bed mobility: Needs Assistance Bed Mobility: Supine to Sit     Supine to sit: Min assist     General bed mobility comments: pt requires incr time and effort to progress to EOB sitting  Transfers Overall transfer level: Needs assistance Equipment used: Rolling walker (2 wheeled) Transfers: Sit to/from Stand Sit to Stand: Min assist         General transfer comment: pt with RW and cues for hand placement on chair. pt requires hand over hand to place hands properly due to decr initiation with verbal cues. pt states "oh" in response to tactile input    Balance Overall balance assessment: Needs assistance Sitting-balance support: Feet supported Sitting balance-Leahy Scale: Fair Sitting balance - Comments: static sitting with LOB with LB dressing posteriorly with mod (A) to correct   Standing  balance support: Single extremity supported Standing balance-Leahy Scale: Fair Standing balance comment: pt requires UE support and min (A) for dynamic balance task like putting paper towel in trash                           ADL either performed or assessed with clinical judgement   ADL Overall ADL's : Needs assistance/impaired     Grooming: Wash/dry hands;Minimal assistance;Standing Grooming Details (indicate cue type and reason): cues for sequence and use of items correctly. pt demonstrates improper use of soap bottle Upper Body Bathing: Minimal assistance   Lower Body Bathing: Moderate assistance       Lower Body Dressing: Moderate assistance Lower Body Dressing Details (indicate cue type and reason): pt requires (A) to lift R le and to thread into pants. pt cues max to place L LE into underwear. Pt needed max cuesing to don shorts over underwear. Pt needed cues again to start with R LE. pt once 3 repetitve attempts says "OH " and initiates L LE on final attempt Toilet Transfer: Minimal assistance Toilet Transfer Details (indicate cue type and reason): cues for positioning. cues to avoid sitting on arm rest and (A) to control descend MOD A()         Functional mobility during ADLs: Moderate assistance General ADL Comments: pt with L preference for transfer and needed max cues to turn R. pt walking into objects iwth RW on R side. pt needed incr time to scan into R visual field. Pt holding hand up and saying "this" and with cueing  finally states "wet"  pt unable to verbalize my hand is wet and i need a towel. pt just stops and holds hand out as a cue to therapist. Pt needed max cueing to locate paper towel by sink.      Vision   Additional Comments: pt using central vision and needed cues to scan R. pt turning head instead of shifting eyes toward R field   Perception     Praxis      Cognition Arousal/Alertness: Awake/alert Behavior During Therapy: WFL for tasks  assessed/performed Overall Cognitive Status: Impaired/Different from baseline Area of Impairment: Attention;Following commands;Safety/judgement                   Current Attention Level: Selective   Following Commands: Follows one step commands with increased time;Follows multi-step commands inconsistently Safety/Judgement: Decreased awareness of safety;Decreased awareness of deficits Awareness: Intellectual Problem Solving: Slow processing;Decreased initiation;Difficulty sequencing General Comments: pt answering questions in very fatigue responses "you know the thing" pt unable to find words to describe or giving a generalized answer that does not answer the question. pt with decr response to english commands compared to translated commands by daughter. Daughter is RN for [facility] and cued to only translate directly to allow patient to problem solve. Pt attempting to use soap and told "THIS IS SOAP" as mouth wash. Soap poured into hand and told "soap" and attempting to bring hand to mouth. OT then hand over hand to wash hands and patient says "OH" and then delayed response to turn on water        Exercises     Shoulder Instructions       General Comments noted to have incontinence on arrival ... bed wet. pt lacks awareness t    Pertinent Vitals/ Pain       Pain Assessment: No/denies pain  Home Living                                          Prior Functioning/Environment              Frequency  Min 3X/week        Progress Toward Goals  OT Goals(current goals can now be found in the care plan section)  Progress towards OT goals: Progressing toward goals  Acute Rehab OT Goals Patient Stated Goal: to go to rehab today per daughter. OT Goal Formulation: With patient/family Time For Goal Achievement: 04/22/18 Potential to Achieve Goals: Good ADL Goals Pt Will Perform Eating: with set-up;with supervision;sitting Pt Will Perform Grooming: with min  assist;standing Pt Will Perform Upper Body Bathing: with supervision;sitting Pt Will Perform Lower Body Bathing: with min assist;sit to/from stand Pt Will Perform Upper Body Dressing: with min assist;sitting Pt Will Perform Lower Body Dressing: with min assist;sit to/from stand Pt Will Transfer to Toilet: with min assist;ambulating;regular height toilet;bedside commode Pt Will Perform Toileting - Clothing Manipulation and hygiene: with min assist;sit to/from stand Additional ADL Goal #1: Pt will locate items on Rt with min cues during ADLs  Plan Discharge plan remains appropriate    Co-evaluation    PT/OT/SLP Co-Evaluation/Treatment: Yes Reason for Co-Treatment: Complexity of the patient's impairments (multi-system involvement)   OT goals addressed during session: ADL's and self-care      AM-PAC PT "6 Clicks" Daily Activity     Outcome Measure   Help from another person eating meals?: A Little Help from  another person taking care of personal grooming?: A Little Help from another person toileting, which includes using toliet, bedpan, or urinal?: A Lot Help from another person bathing (including washing, rinsing, drying)?: A Lot Help from another person to put on and taking off regular upper body clothing?: A Little Help from another person to put on and taking off regular lower body clothing?: A Lot 6 Click Score: 15    End of Session Equipment Utilized During Treatment: Rolling walker  OT Visit Diagnosis: Unsteadiness on feet (R26.81) Symptoms and signs involving cognitive functions: Other Nontraumatic ICH Hemiplegia - Right/Left: Right Hemiplegia - dominant/non-dominant: Dominant Hemiplegia - caused by: Other Nontraumatic intracranial hemorrhage   Activity Tolerance Patient tolerated treatment well   Patient Left in chair;with call bell/phone within reach;with chair alarm set;with family/visitor present   Nurse Communication Mobility status;Precautions        Time:  2060-1561 OT Time Calculation (min): 26 min  Charges: OT General Charges $OT Visit: 1 Visit OT Treatments $Self Care/Home Management : 8-22 mins   Jeri Modena   OTR/L Pager: [phone] Office: [phone] .    Parke Poisson B 04/15/2018, 1:32 PM

## 2018-04-15 NOTE — Care Management Note (Signed)
Case Management Note  Patient Details  Name: Hector Pugh MRN: [id number] Date of Birth: [birth year]  Subjective/Objective:                 Patient admitted w ICH, s/p crani, per progression rounds anticipate weaning off 3% with goal of CIR admission Friday. CM will continue to follow.    Action/Plan:   Expected Discharge Date:  04/15/18               Expected Discharge Plan:  [place]  In-House Referral:     Discharge planning Services  CM Consult  Post Acute Care Choice:    Choice offered to:     DME Arranged:    DME Agency:     HH Arranged:    HH Agency:     Status of Service:  Completed, signed off  If discussed at [employer] [facility] [employer], dates discussed:    Additional Comments:   04/15/18 J. Neylan Koroma, RN, BSN Pt medically stable for discharge, and has been accepted for admission to [facility] today.  Plan dc to CIR later today.    Ella Bodo, RN 04/15/2018, 12:07 PM

## 2018-04-15 NOTE — Progress Notes (Signed)
Jhonnie Garner, OT  Rehab Admission Coordinator  Physical Medicine and Rehabilitation  PMR Pre-admission  Signed  Date of Service:  04/10/2018 3:47 PM       Related encounter: ED to Hosp-Admission (Current) from 04/05/2018 in [facility]      Signed           PMR Admission Coordinator Pre-Admission Assessment  Patient: Hector Pugh is an 57 y.o., male MRN: [id number] DOB: [birth year] Height: 5\' 6"  (167.6 cm) Weight: 72.5 kg (159 lb 13.3 oz)                                                                                                                                                  Insurance Information HMO:     PPO: YES     PCP:      IPA:      80/20:      OTHER: Open Access Plus with [employer].  PRIMARYChristella Scheuermann      Policy#: [id number]      Subscriber: Patient CM Name: Butch Penny      Phone#: [phone] [id number]    Fax#: [phone] and has Epic access Pre-Cert#: [id number]      Employer:  Benefits:  Phone #: [phone]     Name: Rudie Meyer. Date: 12/04/17     Deduct: $500      Out of Pocket Max: $2,500 including Deduct      Life Max: None CIR: 90%/10%      SNF: 90%/10% with 120 day limit/calendar year Outpatient: 90%     Co-Pay: 10% with 12 visits/PT and OT comb, no ST limits Home Health: 90% with 100 visit limit per calendar year     Co-Pay: 10% DME: 90%     Co-Pay: 10% Providers: In network SECONDARY: None        Medicaid Application Date:       Case Manager:  Disability Application Date:       Case Worker:   Emergency Publishing copy Information    Name Relation Home Work [place] Spouse [phone]  [phone]   Glotfelty,Puja Daughter [phone]  [phone]     Current Medical History  Patient Admitting Diagnosis: Large Hemorrhage in the Left Temporal Lobe History of Present Illness: TGG:YIRSWNIO Frasier is a 57 year old male with history of pancreatic cancer s/p  Whipple with onset of DM, HTN who was admitted on 04/05/18 after found with decreased LOC, vomitus on the bed, difficulty speaking and decreased right sided movement--last seen normal few hours earlier. CT head done revealing large temporal lobe hemorrhage with surrounding edema, mass effect, IVH and mild hydrocephalus with 23 mm midline shift. CTA head showed large temporal lobe hemorrhage without occlusion, embolus, AVM or aneurysm--bleed felt to be lobar hemorrhage.   He  was taken to OR emergently for left craniotomy and evacuation of hemorrhage by Dr. Christella Noa. Post op placed on Keppra for seizure prophylaxis and started on IV Zosyn due to concerns of aspiration PNAand completed treatment on 5/7.Hospital course significant forcerebral edema requiring hypertonic saline,coagulopathy with drop in platelets to 113 and INR 1.4, A fib with RVR and labile BP. He was weaned off cleviprex by 5/8 and hypertonic saline by 5/9. Serial CCT monitored with most recent CT 5/9 showing evolving left temporal lobewith decrease in volume, increase inlocalizedvasogenic edema with partial effacement of left lateral ventricle,stable infarct in left posterior temporaloccipitallobe and no hydrocephalus.He did develop lethargy later that day and hypertonic saline as well as Cleviprex resumed. Dr. Christella Noa evaluated films and felt that no significant changes noted and no need to check further CT as patient neurologically stable.Pt was off Cleviprex and 3% saline drips on 04/14/18. Pt is to be admitted to CIR today on 04/15/18.   Total: 5 NIHSS  Past Medical History      Past Medical History:  Diagnosis Date  . 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 Cancer ([place])   . Diabetes mellitus ([place]) 11/10/2012  . Diabetes mellitus without complication ([facility])    diet controlled  . HTN (hypertension) 11/10/2012  . Hypertension   . 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  Pancreatic cancer ([place])   . Pancreatitis 11/10/2012  . Seasonal allergies     Family History  family history includes Cancer in his mother.  Prior Rehab/Hospitalizations:  Has the patient had major surgery during 100 days prior to admission? Yes; Colonoscopy on May 2nd 2019.   Current Medications   Current Facility-Administered Medications:  .   stroke: mapping our early stages of recovery book, , Does not apply, Once, Ashok Pall, MD .  acetaminophen (TYLENOL) tablet 650 mg, 650 mg, Oral, Q4H PRN, 650 mg at 04/15/18 0747 **OR** [DISCONTINUED] acetaminophen (TYLENOL) solution 650 mg, 650 mg, Per Tube, Q4H PRN **OR** [DISCONTINUED] acetaminophen (TYLENOL) suppository 650 mg, 650 mg, Rectal, Q4H PRN, Ashok Pall, MD, 650 mg at 04/06/18 1612 .  amiodarone (PACERONE) tablet 200 mg, 200 mg, Oral, Daily, Sivakumar, Siva P, MD, 200 mg at 04/15/18 0938 .  amLODipine (NORVASC) tablet 10 mg, 10 mg, Oral, Daily, Rosalin Hawking, MD, 10 mg at 04/15/18 0938 .  butalbital-acetaminophen-caffeine (FIORICET, ESGIC) 50-325-40 MG per tablet 1 tablet, 1 tablet, Oral, Q8H PRN, Rosalin Hawking, MD, 1 tablet at 04/15/18 0156 .  feeding supplement (ENSURE ENLIVE) (ENSURE ENLIVE) liquid 237 mL, 237 mL, Oral, BID BM, Rosalin Hawking, MD, 237 mL at 04/13/18 1303 .  insulin aspart (novoLOG) injection 0-5 Units, 0-5 Units, Subcutaneous, QHS, Rosalin Hawking, MD, 2 Units at 04/13/18 2252 .  insulin aspart (novoLOG) injection 0-9 Units, 0-9 Units, Subcutaneous, TID WC, Rosalin Hawking, MD, 1 Units at 04/15/18 [phone] .  labetalol (NORMODYNE,TRANDATE) injection 10-20 mg, 10-20 mg, Intravenous, Q10 min PRN, Burnetta Sabin L, NP, 20 mg at 04/14/18 1837 .  levETIRAcetam (KEPPRA) tablet 500 mg, 500 mg, Oral, BID, Rosalin Hawking, MD, 500 mg at 04/15/18 9381 .  levothyroxine (SYNTHROID, LEVOTHROID) tablet 50 mcg, 50 mcg, Oral, QAC breakfast, Rosalin Hawking, MD, 50 mcg at 04/15/18 0747 .  losartan (COZAAR) tablet 50 mg, 50 mg, Oral, BID, Rosalin Hawking,  MD, 50 mg at 04/15/18 8299 .  metoprolol tartrate (LOPRESSOR) injection 5 mg, 5 mg, Intravenous, Q6H PRN, Thomas, Tijo, DO, 5 mg at 04/12/18 1619 .  metoprolol tartrate (  LOPRESSOR) tablet 100 mg, 100 mg, Oral, BID, Arnaldo Natal, MD, 100 mg at 04/15/18 0938 .  pantoprazole (PROTONIX) EC tablet 40 mg, 40 mg, Oral, Daily, Rosalin Hawking, MD, 40 mg at 04/15/18 0938 .  pneumococcal 23 valent vaccine (PNU-IMMUNE) injection 0.5 mL, 0.5 mL, Intramuscular, Tomorrow-1000, Rosalin Hawking, MD .  senna [facility]) tablet 8.6 mg, 1 tablet, Oral, Daily, Hayden Pedro M, NP, 8.6 mg at 04/14/18 2152 .  tamsulosin (FLOMAX) capsule 0.4 mg, 0.4 mg, Oral, Daily, Rosalin Hawking, MD, 0.4 mg at 04/15/18 2637  Patients Current Diet:       Diet Order           DIET DYS 3 Room service appropriate? Yes; Fluid consistency: Thin  Diet effective now          Precautions / Restrictions Precautions Precautions: Fall Precaution Comments: still inattentive to the right side,  worse with fatigue, but improving Restrictions Weight Bearing Restrictions: No   Has the patient had 2 or more falls or a fall with injury in the past year?No  Prior Activity Level  Home Assistive Devices / Equipment Home Equipment: None  Prior Device Use: Indicate devices/aids used by the patient prior to current illness, exacerbation or injury? 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  He has two MS degrees   Self Care: Did the patient need help bathing, dressing, using the toilet or eating?  Independent  Indoor Mobility: Did the patient need assistance with walking from room to room (with or without device)? Independent  Stairs: Did the patient need assistance with internal or external stairs (with or without device)? Independent  Functional Cognition: Did the patient need help planning regular tasks such as shopping or remembering to take  medications? Independent  Current Functional Level Cognition  Arousal/Alertness: Lethargic Overall Cognitive Status: Difficult to assess Difficult to assess due to: Impaired communication Current Attention Level: Selective, Sustained Orientation Level: Oriented to person, Oriented to place, Oriented to time Following Commands: Follows one step commands with increased time, Follows one step commands inconsistently General Comments: Continue to present with Rt inattention. mildly impulsive.  Communication deficits make accurate assessment of cognition difficult. Pt requiring increased cues and time throughout session. During toileting, pt requirint direct hand over hand to facilitate toilet hygiene due to poor following of verbal cues. Attention: Focused, Sustained Focused Attention: Appears intact Sustained Attention: Impaired Sustained Attention Impairment: Verbal basic, Functional basic Problem Solving: Impaired Problem Solving Impairment: Functional basic    Extremity Assessment (includes Sensation/Coordination)  Upper Extremity Assessment: RUE deficits/detail RUE Deficits / Details: Pt able to flex shoulder to ~120*, full finger flex/ext.   Pt with Rt inattention, and fails to incorporate Rt UE into activity consistently.  He will at times, spontaneously attempt to use it for self feeding ` RUE Sensation: decreased proprioception RUE Coordination: decreased fine motor, decreased gross motor  Lower Extremity Assessment: RLE deficits/detail RLE Deficits / Details: patient with active movement against gravity gross motions, responds to multi modal cues for assessment. grossly 3+/5. Able to take on weight through functional standing but noted synergistic compensation at times with movement. RLE Sensation: decreased proprioception RLE Coordination: decreased fine motor, decreased gross motor    ADLs  Overall ADL's : Needs assistance/impaired Eating/Feeding: Minimal assistance,  Sitting Eating/Feeding Details (indicate cue type and reason): Pt tends to use Lt UE for all tasks.  Mitt placed over Lt hand to encourage Rt hand use.  Pt  able to feed self finger foods and french fries (30% of meal) with min A - required assist to pick up items.  He fatigued with activity  Grooming: Wash/dry hands, Minimal assistance, Sitting Grooming Details (indicate cue type and reason): Providing hand sanitizer while seated in recliner. Pt stating "thank you" and began rubbing his hands together. Upper Body Bathing: Moderate assistance, Sitting Lower Body Bathing: Maximal assistance, Sit to/from stand Upper Body Dressing : Moderate assistance, Sitting Upper Body Dressing Details (indicate cue type and reason): Mod A to doff/don gown which was soiled Lower Body Dressing: Maximal assistance, Sit to/from stand Toilet Transfer: Moderate assistance, +2 for safety/equipment, Ambulation, Comfort height toilet, RW Toilet Transfer Details (indicate cue type and reason): Mod A for safe descent to toilet. Toileting- Clothing Manipulation and Hygiene: Maximal assistance, Sit to/from stand, +2 for safety/equipment Toileting - Clothing Manipulation Details (indicate cue type and reason): Pt with poor following of VCs to perform toilet hygiene after BM. Pt requiring hand over hand to facilitate toilet hygiene. Pt continues to requiring Max A to complete task.  Functional mobility during ADLs: Moderate assistance, Minimal assistance, +2 for physical assistance, +2 for safety/equipment General ADL Comments: Pt having BM in bed upoin arrival. Pt requiring Max A for toilet hygiene. When asking pt to complete toilet hygiene, pt washing his face. Requiring hand over hand to complete toilet hygiene.     Mobility  Overal bed mobility: Needs Assistance Bed Mobility: Supine to Sit Supine to sit: Min assist General bed mobility comments: minimal assist to help direct to EOB, more for awareness than need for assist     Transfers  Overall transfer level: Needs assistance Transfers: Sit to/from Stand Sit to Stand: Min assist, +2 safety/equipment Stand pivot transfers: Min assist, +2 physical assistance, +2 safety/equipment General transfer comment: cues and assist for pt to come forward more before powering up.    Ambulation / Gait / Stairs / Wheelchair Mobility  Ambulation/Gait Ambulation/Gait assistance: Min assist, +2 physical assistance Ambulation Distance (Feet): 140 Feet Assistive device: None, 1 person hand held assist, 2 person hand held assist Gait Pattern/deviations: Step-through pattern General Gait Details: pt noticeably more steady, but with mild hip instability and right hip drop which degraded with fatigue.  Pt also needed general balance and upper trunk flexion control. Gait velocity: decreased Gait velocity interpretation: <1.31 ft/sec, indicative of household ambulator    Posture / Balance Dynamic Sitting Balance Sitting balance - Comments: static sitting with close min guard assist  Balance Overall balance assessment: Needs assistance Sitting-balance support: Feet supported Sitting balance-Leahy Scale: Fair Sitting balance - Comments: static sitting with close min guard assist  Standing balance support: Single extremity supported Standing balance-Leahy Scale: Poor Standing balance comment: Pt loses balance to the Lt and posteriorly - up to mod A to correct     Special needs/care consideration BiPAP/CPAP: no CPM: no Continuous Drip IV: No  Dialysis: No        Days: NA Life Vesta: No  Oxygen: No Special Bed: No Trach Size: No Wound Vac (area): No      Location: NA Skin: surgical incision                               Location: L temporal skull area Bowel mgmt: continent; last BM 04/14/18 Bladder mgmt: continent, use of urinal from EOB or chair  Diabetic mgmt: yes, use of oral pills for management (checks 1x/morning)  Previous Home Environment Living  Arrangements: Spouse/significant other  Lives With: Spouse Available Help at Discharge: Family, Available 24 hours/day Type of Home: House Home Layout: Multi-level, Able to live on main level with bedroom/bathroom Home Access: Stairs to enter Entrance Stairs-Rails: None Entrance Stairs-Number of Steps: 2 Bathroom Shower/Tub: Multimedia programmer: Standard Home Care Services: No  Discharge Living Setting Does the patient have any problems obtaining your medications?: No  Social/Family/Support Systems Patient Roles: Spouse, Parent Contact Information: wife Anticipated Caregiver: WifeCathren Harsh; Home: [phone]; Cell: [phone] Anticipated Caregiver's Contact Information: for wife see above; Kalep Full is daugther: [phone]; cell: [phone] Ability/Limitations of Caregiver: NA(per wife, they plan to use family for the 24/7 support) Caregiver Availability: Other (Comment)(family (wife + daughter+family) to cover 24/7) Discharge Plan Discussed with Primary Caregiver: Yes Is Caregiver In Agreement with Plan?: Yes Does Caregiver/Family have Issues with Lodging/Transportation while Pt is in Rehab?: No   Goals/Additional Needs Patient/Family Goal for Rehab: Supervision/Min A for OT/PT; Supervision for SLP Expected length of stay: 20-25 days  Cultural Considerations: Per daugther, people feed with their RUE, which is pt's affected side at this time; Pt likes to listen to religious music on tablet; religion is big part of his life. Pt's native tongue is [place] but speaks [place]. Since admission, pt responds better to direct commands in his native tongue but will respond back in [place].  Dietary Needs: Dys 3 diet; thin fluids. Pt is diabetic diet vegetarian (per chart order, dietitian to order nutrition supplments as needed Equipment Needs: TBD at next level of care Pt/Family Agrees to Admission and willing to participate: Yes Program Orientation Provided & Reviewed  with Pt/Caregiver Including Roles  & Responsibilities: Yes(Reviewed with wife and wife's sister)  Barriers to Discharge: Home environment access/layout  Barriers to Discharge Comments: 2 steps to enter home; able to live on first floor   Decrease burden of Care through IP rehab admission: NA   Possible need for SNF placement upon discharge: not anticipated   Patient Condition: This patient's medical and functional status has changed since the consult dated: 04/09/18 in which the Rehabilitation Physician determined and documented that the patient's condition is appropriate for intensive rehabilitative care in an inpatient rehabilitation facility. See "History of Present Illness" (above) for medical update. Functional changes are: Min A+2 for transfers and ambulation. Patient's medical and functional status update has been discussed with the Rehabilitation physician and patient remains appropriate for inpatient rehabilitation. Will admit to inpatient rehab today.  Preadmission Screen Completed By:  Jhonnie Garner, 04/15/2018 11:04 AM ______________________________________________________________________   Discussed status with Dr. Posey Pronto on 04/15/18 at 11:12AM and received telephone approval for admission today.  Admission Coordinator:  Jhonnie Garner, time 11:12AM/Date 04/15/18             Cosigned by: Jamse Arn, MD at 04/15/2018 11:57 AM  Revision History

## 2018-04-15 NOTE — H&P (Signed)
Physical Medicine and Rehabilitation Admission H&P    Chief Complaint  Patient presents with  . Code Stroke  : HPI:  Hector Pugh is a 57 year old male with history of pancreatic cancer s/p Whipple with onset of DM, HTN who was admitted on 04/05/18 after found with decreased LOC, vomitus on the bed, difficulty speaking and decreased right sided movement--last seen normal few hours earlier. CT head done revealing large temporal lobe hemorrhage with surrounding edema, mass effect, IVH and mild hydrocephalus with 23 mm midline shift.  CTA head showed large temporal lobe hemorrhage without occlusion, embolus, AVM or aneurysm--bleed felt to be lobar hemorrhage.   He was taken to OR emergently for left craniotomy and evacuation of hemorrhage by Dr. Christella Noa. Post op placed on Keppra for seizure prophylaxis and started on IV Zosyn due to concerns of aspiration PNA and completed treatment on 5/7.  Hospital course significant for cerebral edema requiring hypertonic saline, coagulopathy with drop in platelets to 113 and INR 1.4, A fib with RVR and labile BP. He was weaned off cleviprex by 5/8 and hypertonic saline by 5/9.  Serial CCT monitored with CT 5/9 showing evolving left temporal lobe with decrease in volume, increase in localized vasogenic edema with partial effacement of left lateral ventricle, stable infarct in left posterior temporal occipital lobe and no hydrocephalus.  He did develop lethargy later that day and hypertonic saline as well as Cleviprex resumed.   CT head on 5/11 reviewed, stable.  Dr. Christella Noa evaluated films and felt that no significant changes noted and no need to check further CT as patient neurologically stable. Hypertonic saline and clevidipine weaned off yesterday and BP/neuro status stable.  He is showing increase in verbal output. Neurology recommends Keppra for 2 weeks total for seizure prophylaxis. Fevers and resting tachycardia have resolved.  Therapy ongoing and patient  is showing improvement in endurance levels. He continues to be limited by left gaze preference with right inattention, right hemiparesis, receptive > expressive aphasia and CIR recommended due to functional deficits.   Review of Systems  Unable to perform ROS: Mental acuity   Past Medical History:  Diagnosis Date  . 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 Cancer ([facility])   . Diabetes mellitus ([facility]) 11/10/2012  . Diabetes mellitus without complication ([facility])    diet controlled  . HTN (hypertension) 11/10/2012  . Hypertension   . 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 Pancreatic cancer ([facility])   . Pancreatitis 11/10/2012  . Seasonal allergies     Past Surgical History:  Procedure Laterality Date  . circucision    . circumsision    . COLONOSCOPY N/A 04/04/2018   Procedure: COLONOSCOPY;  Surgeon: Leighton Ruff, MD;  Location: [facility];  Service: Endoscopy;  Laterality: N/A;  . CRANIOTOMY Left 04/05/2018   Procedure: Left Temporal CRANIOTOMY HEMATOMA EVACUATION of Intracranial Hemorrhage;  Surgeon: Ashok Pall, MD;  Location: [facility];  Service: Neurosurgery;  Laterality: Left;  . ERCP  11/08/2012   Procedure: ENDOSCOPIC RETROGRADE CHOLANGIOPANCREATOGRAPHY (ERCP);  Surgeon: Jeryl Columbia, MD;  Location: [facility];  Service: Gastroenterology;  Laterality: N/A;  . EUS  11/08/2012   Procedure: UPPER ENDOSCOPIC ULTRASOUND (EUS) LINEAR;  Surgeon: Arta Silence, MD;  Location: [facility];  Service: Endoscopy;  Laterality: N/A;  . FINE NEEDLE ASPIRATION  11/08/2012   Procedure: FINE NEEDLE ASPIRATION (FNA) LINEAR;  Surgeon: Arta Silence, MD;  Location: [facility];  Service: Endoscopy;  Laterality: N/A;  .  HERNIA REPAIR     RIGHT  . LAPAROSCOPY  12/12/2012   Procedure: LAPAROSCOPY DIAGNOSTIC;  Surgeon: Stark Klein, MD;  Location: [facility];  Service: General;  Laterality: N/A;  . Left Foot Surgery    . Left Foot Surgery    . VASECTOMY    . WHIPPLE PROCEDURE   12/12/2012   Procedure: WHIPPLE PROCEDURE;  Surgeon: Stark Klein, MD;  Location: [facility];  Service: General;  Laterality: N/A;    Family History  Problem Relation Age of Onset  . Cancer Mother        esophageal carcinoma    Social History:  Married. Independent and working PTA. He reports that he has never smoked. He has never used smokeless tobacco. He reports that he does not drink alcohol or use drugs.    Allergies: No Known Allergies    Medications Prior to Admission  Medication Sig Dispense Refill  . acetaminophen (TYLENOL) 500 MG tablet Take 500 mg by mouth every 4 (four) hours as needed for moderate pain or headache.     Marland Kitchen glipiZIDE (GLUCOTROL XL) 5 MG 24 hr tablet Take 5 mg by mouth daily with breakfast.     . levothyroxine (SYNTHROID, LEVOTHROID) 50 MCG tablet Take 50 mcg by mouth daily before breakfast.    . losartan (COZAAR) 50 MG tablet Take 50 mg by mouth daily.    . tamsulosin (FLOMAX) 0.4 MG CAPS capsule Take 0.4 mg by mouth daily.  3    Drug Regimen Review  Drug regimen was reviewed and remains appropriate with no significant issues identified  Home: Home Living Family/patient expects to be discharged to:: Inpatient rehab Living Arrangements: Spouse/significant other Available Help at Discharge: Family, Available 24 hours/day Type of Home: House Home Access: Stairs to enter [employer] of Steps: 2 Entrance Stairs-Rails: None Home Layout: Multi-level, Able to live on main level with bedroom/bathroom Bathroom Shower/Tub: Multimedia programmer: Standard Home Equipment: None  Lives With: Spouse   Functional History: Prior Function Level of Independence: Independent Comments: 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  He has two MS degrees   Functional Status:  Mobility: Bed Mobility Overal bed mobility: Needs Assistance Bed Mobility: Supine to Sit Supine to sit: Min assist General bed mobility comments: minimal assist to help direct to EOB,  more for awareness than need for assist Transfers Overall transfer level: Needs assistance Transfers: Sit to/from Stand Sit to Stand: Min assist, +2 safety/equipment Stand pivot transfers: Min assist, +2 physical assistance, +2 safety/equipment General transfer comment: cues and assist for pt to come forward more before powering up. Ambulation/Gait Ambulation/Gait assistance: Min assist, +2 physical assistance Ambulation Distance (Feet): 140 Feet Assistive device: None, 1 person hand held assist, 2 person hand held assist Gait Pattern/deviations: Step-through pattern General Gait Details: pt noticeably more steady, but with mild hip instability and right hip drop which degraded with fatigue.  Pt also needed general balance and upper trunk flexion control. Gait velocity: decreased Gait velocity interpretation: <1.31 ft/sec, indicative of household ambulator    ADL: ADL Overall ADL's : Needs assistance/impaired Eating/Feeding: Minimal assistance, Sitting Eating/Feeding Details (indicate cue type and reason): Pt tends to use Lt UE for all tasks.  Mitt placed over Lt hand to encourage Rt hand use.  Pt able to feed self finger foods and french fries (30% of meal) with min A - required assist to pick up items.  He fatigued with activity  Grooming: Wash/dry hands, Minimal assistance, Sitting Grooming Details (indicate cue type  and reason): Providing hand sanitizer while seated in recliner. Pt stating "thank you" and began rubbing his hands together. Upper Body Bathing: Moderate assistance, Sitting Lower Body Bathing: Maximal assistance, Sit to/from stand Upper Body Dressing : Moderate assistance, Sitting Upper Body Dressing Details (indicate cue type and reason): Mod A to doff/don gown which was soiled Lower Body Dressing: Maximal assistance, Sit to/from stand Toilet Transfer: Moderate assistance, +2 for safety/equipment, Ambulation, Comfort height toilet, RW Toilet Transfer Details (indicate  cue type and reason): Mod A for safe descent to toilet. Toileting- Clothing Manipulation and Hygiene: Maximal assistance, Sit to/from stand, +2 for safety/equipment Toileting - Clothing Manipulation Details (indicate cue type and reason): Pt with poor following of VCs to perform toilet hygiene after BM. Pt requiring hand over hand to facilitate toilet hygiene. Pt continues to requiring Max A to complete task.  Functional mobility during ADLs: Moderate assistance, Minimal assistance, +2 for physical assistance, +2 for safety/equipment General ADL Comments: Pt having BM in bed upoin arrival. Pt requiring Max A for toilet hygiene. When asking pt to complete toilet hygiene, pt washing his face. Requiring hand over hand to complete toilet hygiene.   Cognition: Cognition Overall Cognitive Status: Difficult to assess Arousal/Alertness: Lethargic Orientation Level: Oriented to person, Oriented to place, Oriented to time Attention: Focused, Sustained Focused Attention: Appears intact Sustained Attention: Impaired Sustained Attention Impairment: Verbal basic, Functional basic Problem Solving: Impaired Problem Solving Impairment: Functional basic Cognition Arousal/Alertness: Awake/alert Behavior During Therapy: WFL for tasks assessed/performed Overall Cognitive Status: Difficult to assess Area of Impairment: Attention, Awareness, Problem solving, Following commands Current Attention Level: Selective, Sustained Following Commands: Follows one step commands with increased time, Follows one step commands inconsistently Awareness: Intellectual(hints of emerging awareness at times) Problem Solving: Slow processing, Decreased initiation, Requires verbal cues, Requires tactile cues, Difficulty sequencing General Comments: Continue to present with Rt inattention. mildly impulsive.  Communication deficits make accurate assessment of cognition difficult. Pt requiring increased cues and time throughout session.  During toileting, pt requirint direct hand over hand to facilitate toilet hygiene due to poor following of verbal cues. Difficult to assess due to: Impaired communication   Blood pressure 121/81, pulse 79, temperature 98.3 F (36.8 C), temperature source Oral, resp. rate 18, height 5' 6"  (1.676 m), weight 72.5 kg (159 lb 13.3 oz), SpO2 100 %. Physical Exam  Nursing note and vitals reviewed. Constitutional: He appears well-developed and well-nourished.  HENT:  Left crani incision intact with sutures in place.   Eyes: Right eye exhibits no discharge. Left eye exhibits no discharge. No scleral icterus.  Neck: Normal range of motion. Neck supple.  Cardiovascular: Normal rate and regular rhythm.  Respiratory: Effort normal and breath sounds normal.  GI: Soft. Bowel sounds are normal.  Musculoskeletal:  1+ pitting edema RLE with resolving ecchymosis on shin. Min edema right hand/forearm.   Neurological: He is alert.  Right inattention with left gaze preference and inability to move eyes to right lateral field.  Right facial weakness with minimal dysarthria. Receptive > Expressive aphasia with apraxia.  Needed max cues for DOB/age. Unable to pick place with choice of two. Unable to follow simple one step motor commands consistently without visual or tactile cues. Perseveration noted with lack of awareness of deficits.  Spontaneously moving all extremities, left stronger than right  Skin:  See above  Psychiatric:  Limited due to aphasia    Results for orders placed or performed during the hospital encounter of 04/05/18 (from the past 48 hour(s))  Glucose, capillary  Status: Abnormal   Collection Time: 04/13/18 12:05 PM  Result Value Ref Range   Glucose-Capillary 212 (H) 65 - 99 mg/dL  Sodium     Status: None   Collection Time: 04/13/18  1:12 PM  Result Value Ref Range   Sodium 142 135 - 145 mmol/L    Comment: Performed at [facility] [address] [place], [place]  [address]  Glucose, capillary     Status: Abnormal   Collection Time: 04/13/18  4:35 PM  Result Value Ref Range   Glucose-Capillary 203 (H) 65 - 99 mg/dL  Sodium     Status: None   Collection Time: 04/13/18  8:33 PM  Result Value Ref Range   Sodium 137 135 - 145 mmol/L    Comment: Performed at [facility], [place] [address] [place], [place] [address]  Glucose, capillary     Status: Abnormal   Collection Time: 04/13/18  9:21 PM  Result Value Ref Range   Glucose-Capillary 207 (H) 65 - 99 mg/dL  Sodium     Status: None   Collection Time: 04/14/18  1:12 AM  Result Value Ref Range   Sodium 140 135 - 145 mmol/L    Comment: Performed at [facility], [place] [address] [place], [place] [address]  CBC     Status: Abnormal   Collection Time: 04/14/18  5:07 AM  Result Value Ref Range   WBC 7.7 4.0 - 10.5 K/uL   RBC 3.13 (L) 4.22 - 5.81 MIL/uL   Hemoglobin 8.8 (L) 13.0 - 17.0 g/dL   HCT 26.7 (L) 39.0 - 52.0 %   MCV 85.3 78.0 - 100.0 fL   MCH 28.1 26.0 - 34.0 pg   MCHC 33.0 30.0 - 36.0 g/dL   RDW 13.5 11.5 - 15.5 %   Platelets 198 150 - 400 K/uL    Comment: Performed at [facility], [place] [address], [place], [place] [address]  Basic metabolic panel     Status: Abnormal   Collection Time: 04/14/18  5:07 AM  Result Value Ref Range   Sodium 141 135 - 145 mmol/L   Potassium 3.6 3.5 - 5.1 mmol/L   Chloride 113 (H) 101 - 111 mmol/L   CO2 24 22 - 32 mmol/L   Glucose, Bld 157 (H) 65 - 99 mg/dL   BUN 17 6 - 20 mg/dL   Creatinine, Ser 0.93 0.61 - 1.24 mg/dL   Calcium 7.8 (L) 8.9 - 10.3 mg/dL   GFR calc non Af Amer >60 >60 mL/min   GFR calc Af Amer >60 >60 mL/min    Comment: (NOTE) The eGFR has been calculated using the CKD EPI equation. This calculation has not been validated in all clinical situations. eGFR's persistently <60 mL/min signify possible Chronic Kidney Disease.    Anion gap 4 (L) 5 - 15    Comment: Performed at [facility] [address] [place],  [place] [address]  Sodium     Status: None   Collection Time: 04/14/18  7:12 AM  Result Value Ref Range   Sodium 142 135 - 145 mmol/L    Comment: Performed at [facility] [address], [place], [place] [address]  Glucose, capillary     Status: Abnormal   Collection Time: 04/14/18  8:42 AM  Result Value Ref Range   Glucose-Capillary 175 (H) 65 - 99 mg/dL  Glucose, capillary     Status: Abnormal   Collection Time:  04/14/18 11:52 AM  Result Value Ref Range   Glucose-Capillary 248 (H) 65 - 99 mg/dL  Glucose, capillary     Status: Abnormal   Collection Time: 04/14/18  3:53 PM  Result Value Ref Range   Glucose-Capillary 186 (H) 65 - 99 mg/dL  Glucose, capillary     Status: Abnormal   Collection Time: 04/14/18 11:21 PM  Result Value Ref Range   Glucose-Capillary 175 (H) 65 - 99 mg/dL  Basic metabolic panel     Status: Abnormal   Collection Time: 04/15/18  2:25 AM  Result Value Ref Range   Sodium 138 135 - 145 mmol/L   Potassium 3.7 3.5 - 5.1 mmol/L   Chloride 107 101 - 111 mmol/L   CO2 23 22 - 32 mmol/L   Glucose, Bld 164 (H) 65 - 99 mg/dL   BUN 15 6 - 20 mg/dL   Creatinine, Ser 0.90 0.61 - 1.24 mg/dL   Calcium 8.2 (L) 8.9 - 10.3 mg/dL   GFR calc non Af Amer >60 >60 mL/min   GFR calc Af Amer >60 >60 mL/min    Comment: (NOTE) The eGFR has been calculated using the CKD EPI equation. This calculation has not been validated in all clinical situations. eGFR's persistently <60 mL/min signify possible Chronic Kidney Disease.    Anion gap 8 5 - 15    Comment: Performed at [facility] [address], [place], [place] [address]  CBC     Status: Abnormal   Collection Time: 04/15/18  2:25 AM  Result Value Ref Range   WBC 8.8 4.0 - 10.5 K/uL   RBC 3.52 (L) 4.22 - 5.81 MIL/uL   Hemoglobin 9.9 (L) 13.0 - 17.0 g/dL   HCT 29.9 (L) 39.0 - 52.0 %   MCV 84.9 78.0 - 100.0 fL   MCH 28.1 26.0 - 34.0 pg   MCHC 33.1 30.0 - 36.0 g/dL   RDW 13.5 11.5 - 15.5 %   Platelets 197 150 - 400  K/uL    Comment: Performed at [facility], [place] [address] [place], [place] [address]  Glucose, capillary     Status: Abnormal   Collection Time: 04/15/18  8:15 AM  Result Value Ref Range   Glucose-Capillary 150 (H) 65 - 99 mg/dL   Comment 1 Notify RN    Comment 2 Document in Chart    No results found.     Medical Problem List and Plan: 1.  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  DVT Prophylaxis/Anticoagulation: Mechanical: Sequential compression devices, below knee Bilateral lower extremities 3. Pain Management: Monitor HA as activity increases. Continue Fioricet.  4. Mood: LCSW to follow for evaluation when appropriate 5. Neuropsych: This patient is not capable of making decisions on his own behalf. 6. Skin/Wound Care: Monitor incision for healing. Maintain adequate hydration and nutritional status.  7. Fluids/Electrolytes/Nutrition: Monitor I/O. Check lytes in am.  8. HTN: Monitor BP bid with SBP goal < 140. Continues to be labile and Norvasc added on 5/9  9. T2DM s/p Whipple for pancreatic cancer: Hgb A1C- 6.8. 10. Seizure prophylaxis: Continue Keppra bid 11. Cerebral edema: Hypertonic saline and Cleviprex weaned off 5/12--Sodium has normalized.   12.  Low grade fevers:  Aspiration PNA treated--antibiotics d/c on 5/7.  Temps 97- 99 range with tylenol ordered prn to keep T< 99.5. Will continue this for now--pan culture if T > 100.5 13. A fib  with RVR: Heart rate controlled with increase in Metoprolol.  Amiodarone has been weaned to oral route--monitor HR bid and with activity.  14. Headaches:  Currently using Fioricet. Will continue to monitor---trial topamax? 15. ABLA:  H/H improving. Monitor for signs of bleeding.      Post Admission Physician Evaluation: 1. Preadmission assessment reviewed and changes made below. 2. Functional deficits secondary  to left temporal lobe  hemorrhage. 3. Patient is admitted to receive collaborative, interdisciplinary care between the physiatrist, rehab nursing staff, and therapy team. 4. Patient's level of medical complexity and substantial therapy needs in context of that medical necessity cannot be provided at a lesser intensity of care such as a SNF. 5. Patient has experienced substantial functional loss from his/her baseline which was documented above under the "Functional History" and "Functional Status" headings.  Judging by the patient's diagnosis, physical exam, and functional history, the patient has potential for functional progress which will result in measurable gains while on inpatient rehab.  These gains will be of substantial and practical use upon discharge  in facilitating mobility and self-care at the household level. 24. Physiatrist will provide 24 hour management of medical needs as well as oversight of the therapy plan/treatment and provide guidance as appropriate regarding the interaction of the two. 7. 24 hour rehab nursing will assist with bladder management, bowel management, safety, skin/wound care, disease management, medication administration, pain management and patient education  and help integrate therapy concepts, techniques,education, etc. 8. PT will assess and treat for/with: Lower extremity strength, range of motion, stamina, balance, functional mobility, safety, adaptive techniques and equipment, wound care, coping skills, pain control, stroke education. Goals are: Supervision. 9. OT will assess and treat for/with: ADL's, functional mobility, safety, upper extremity strength, adaptive techniques and equipment, wound mgt, ego support, and community reintegration.   Goals are: Supervision. Therapy may not proceed with showering this patient. 10. SLP will assess and treat for/with: speech, language, cognition, swallowing.  Goals are: Supervision/Min A. 11. Case Management and Social Worker will assess and treat  for psychological issues and discharge planning. 12. Team conference will be held weekly to assess progress toward goals and to determine barriers to discharge. 13. Patient will receive at least 3 hours of therapy per day at least 5 days per week. 14. ELOS: 11-16 days.       15. Prognosis:  good  I have personally performed a face to face diagnostic evaluation, including, but not limited to relevant history and physical exam findings, of this patient and developed relevant assessment and plan.  Additionally, I have reviewed and concur with the physician assistant's documentation above.  Delice Lesch, MD, ABPMR Bary Leriche, PA-C 04/15/2018

## 2018-04-16 ENCOUNTER — Inpatient Hospital Stay (HOSPITAL_COMMUNITY): Payer: Managed Care, Other (non HMO) | Admitting: Occupational Therapy

## 2018-04-16 ENCOUNTER — Inpatient Hospital Stay (HOSPITAL_COMMUNITY): Payer: Managed Care, Other (non HMO)

## 2018-04-16 ENCOUNTER — Inpatient Hospital Stay (HOSPITAL_COMMUNITY): Payer: Managed Care, Other (non HMO) | Admitting: Physical Therapy

## 2018-04-16 ENCOUNTER — Inpatient Hospital Stay (HOSPITAL_COMMUNITY): Payer: Managed Care, Other (non HMO) | Admitting: Speech Pathology

## 2018-04-16 DIAGNOSIS — E8809 Other disorders of plasma-protein metabolism, not elsewhere classified: Secondary | ICD-10-CM

## 2018-04-16 DIAGNOSIS — R509 Fever, unspecified: Secondary | ICD-10-CM

## 2018-04-16 DIAGNOSIS — E46 Unspecified protein-calorie malnutrition: Secondary | ICD-10-CM

## 2018-04-16 LAB — URINALYSIS, COMPLETE (UACMP) WITH MICROSCOPIC
Bacteria, UA: NONE SEEN
Bilirubin Urine: NEGATIVE
GLUCOSE, UA: 50 mg/dL — AB
HGB URINE DIPSTICK: NEGATIVE
Ketones, ur: NEGATIVE mg/dL
Leukocytes, UA: NEGATIVE
Nitrite: NEGATIVE
Protein, ur: NEGATIVE mg/dL
SPECIFIC GRAVITY, URINE: 1.012 (ref 1.005–1.030)
pH: 8 (ref 5.0–8.0)

## 2018-04-16 LAB — COMPREHENSIVE METABOLIC PANEL
ALT: 25 U/L (ref 17–63)
AST: 22 U/L (ref 15–41)
Albumin: 2.8 g/dL — ABNORMAL LOW (ref 3.5–5.0)
Alkaline Phosphatase: 36 U/L — ABNORMAL LOW (ref 38–126)
Anion gap: 9 (ref 5–15)
BILIRUBIN TOTAL: 0.6 mg/dL (ref 0.3–1.2)
BUN: 14 mg/dL (ref 6–20)
CO2: 24 mmol/L (ref 22–32)
CREATININE: 0.97 mg/dL (ref 0.61–1.24)
Calcium: 8.2 mg/dL — ABNORMAL LOW (ref 8.9–10.3)
Chloride: 104 mmol/L (ref 101–111)
GFR calc Af Amer: >60 mL/min (ref >60)
Glucose, Bld: 182 mg/dL — ABNORMAL HIGH (ref 65–99)
Potassium: 3.5 mmol/L (ref 3.5–5.1)
Sodium: 137 mmol/L (ref 135–145)
Total Protein: 5.4 g/dL — ABNORMAL LOW (ref 6.5–8.1)

## 2018-04-16 LAB — CBC WITH DIFFERENTIAL/PLATELET
BASOS ABS: 0 10*3/uL (ref 0.0–0.1)
Basophils Relative: 0 %
Eosinophils Absolute: 0.1 10*3/uL (ref 0.0–0.7)
Eosinophils Relative: 2 %
HCT: 28.5 % — ABNORMAL LOW (ref 39.0–52.0)
Hemoglobin: 9.5 g/dL — ABNORMAL LOW (ref 13.0–17.0)
LYMPHS PCT: 23 %
Lymphs Abs: 2 10*3/uL (ref 0.7–4.0)
MCH: 28.1 pg (ref 26.0–34.0)
MCHC: 33.3 g/dL (ref 30.0–36.0)
MCV: 84.3 fL (ref 78.0–100.0)
Monocytes Absolute: 0.4 10*3/uL (ref 0.1–1.0)
Monocytes Relative: 5 %
NEUTROS ABS: 6.1 10*3/uL (ref 1.7–7.7)
Neutrophils Relative %: 70 %
Platelets: 234 10*3/uL (ref 150–400)
RBC: 3.38 MIL/uL — AB (ref 4.22–5.81)
RDW: 13.5 % (ref 11.5–15.5)
WBC: 8.6 10*3/uL (ref 4.0–10.5)

## 2018-04-16 LAB — GLUCOSE, CAPILLARY
GLUCOSE-CAPILLARY: 160 mg/dL — AB (ref 65–99)
GLUCOSE-CAPILLARY: 171 mg/dL — AB (ref 65–99)
GLUCOSE-CAPILLARY: 199 mg/dL — AB (ref 65–99)
Glucose-Capillary: 140 mg/dL — ABNORMAL HIGH (ref 65–99)

## 2018-04-16 MED ORDER — PREMIER PROTEIN SHAKE
11.0000 [oz_av] | Freq: Two times a day (BID) | ORAL | Status: DC
  Administered 2018-04-16 – 2018-04-17 (×2): 11 [oz_av] via ORAL
  Filled 2018-04-16 (×9): qty 325.31

## 2018-04-16 MED ORDER — PRO-STAT SUGAR FREE PO LIQD
30.0000 mL | Freq: Two times a day (BID) | ORAL | Status: DC
  Administered 2018-04-16: 30 mL via ORAL

## 2018-04-16 NOTE — Evaluation (Signed)
Occupational Therapy Assessment and Plan  Patient Details  Name: Hector Pugh MRN: [id number] Date of Birth: 05/09/1961  OT Diagnosis: apraxia, cognitive deficits, disturbance of vision, hemiplegia affecting dominant side and muscle weakness (generalized) Rehab Potential: Rehab Potential (ACUTE ONLY): Good ELOS: 12-14 days   Today's Date: 04/16/2018 OT Individual Time: 6967-8938 OT Individual Time Calculation (min): 58 min     Problem List:  Patient Active Problem List   Diagnosis Date Noted  . Fever   . Hypoalbuminemia due to protein-calorie malnutrition ([place])   . Left temporal lobe hemorrhage ([place]) 04/15/2018  . Vascular headache   . Seizure prophylaxis   . Cerebral edema (HCC)   . Atrial fibrillation with rapid ventricular response ([place])   . Pneumonia   . Diabetes mellitus type 2 in nonobese (HCC)   . Benign essential HTN   . History of pancreatic cancer   . FUO (fever of unknown origin)   . Dysphagia, post-stroke   . Tachycardia   . Tachypnea   . Hypothyroidism   . Hypernatremia   . Hypokalemia   . Acute blood loss anemia   . Thrombocytopenia ([place])   . Intracranial hemorrhage ([place]) 04/05/2018  . Acute pancreatitis 06/11/2014  . Headache 06/11/2014  . 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 Obstructive jaundice 11/11/2012  . Biliary stricture 11/11/2012  . Diabetes mellitus ([place]) 11/10/2012  . HTN (hypertension) 11/10/2012  . Pancreatitis 11/10/2012  . Abdominal pain 11/10/2012    Past Medical History:  Past Medical History:  Diagnosis Date  . 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 Cancer ([place])   . Diabetes mellitus ([place]) 11/10/2012  . Diabetes mellitus without complication (HCC)    diet controlled  . HTN (hypertension) 11/10/2012  . Hypertension   . 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 Pancreatic cancer ([place])   . Pancreatitis 11/10/2012  . Seasonal allergies     Past Surgical History:  Past Surgical History:  Procedure Laterality Date  . circucision    . circumsision    . COLONOSCOPY N/A 04/04/2018   Procedure: COLONOSCOPY;  Surgeon: Leighton Ruff, MD;  Location: [facility];  Service: Endoscopy;  Laterality: N/A;  . CRANIOTOMY Left 04/05/2018   Procedure: Left Temporal CRANIOTOMY HEMATOMA EVACUATION of Intracranial Hemorrhage;  Surgeon: Ashok Pall, MD;  Location: [facility];  Service: Neurosurgery;  Laterality: Left;  . ERCP  11/08/2012   Procedure: ENDOSCOPIC RETROGRADE CHOLANGIOPANCREATOGRAPHY (ERCP);  Surgeon: Jeryl Columbia, MD;  Location: [facility];  Service: Gastroenterology;  Laterality: N/A;  . EUS  11/08/2012   Procedure: UPPER ENDOSCOPIC ULTRASOUND (EUS) LINEAR;  Surgeon: Arta Silence, MD;  Location: [facility];  Service: Endoscopy;  Laterality: N/A;  . FINE NEEDLE ASPIRATION  11/08/2012   Procedure: FINE NEEDLE ASPIRATION (FNA) LINEAR;  Surgeon: Arta Silence, MD;  Location: [facility];  Service: Endoscopy;  Laterality: N/A;  . HERNIA REPAIR     RIGHT  . LAPAROSCOPY  12/12/2012   Procedure: LAPAROSCOPY DIAGNOSTIC;  Surgeon: Stark Klein, MD;  Location: [facility];  Service: General;  Laterality: N/A;  . Left Foot Surgery    . Left Foot Surgery    . VASECTOMY    . WHIPPLE PROCEDURE  12/12/2012   Procedure: WHIPPLE PROCEDURE;  Surgeon: Stark Klein, MD;  Location: [facility];  Service: General;  Laterality: N/A;    Assessment & Plan Clinical Impression: Patient is a 57 y.o. year old male with history of pancreatic cancer s/p  [place] with onset of DM, HTN who was admitted on 04/05/18 after found with decreased LOC, vomitus on the bed, difficulty speaking and decreased right sided movement--last seen normal few hours earlier. CT head done revealing large temporal lobe hemorrhage with surrounding edema, mass effect, IVH and mild hydrocephalus with 23 mm midline shift.  CTA head showed large temporal lobe hemorrhage without occlusion, embolus, AVM  or aneurysm--bleed felt to be lobar hemorrhage.   He was taken to OR emergently for left craniotomy and evacuation of hemorrhage by Dr. Christella Noa. Post op placed on Keppra for seizure prophylaxis and started on IV Zosyn due to concerns of aspiration PNA and completed treatment on 5/7.  Hospital course significant for cerebral edema requiring hypertonic saline, coagulopathy with drop in platelets to 113 and INR 1.4, A fib with RVR and labile BP. He was weaned off cleviprex by 5/8 and hypertonic saline by 5/9.  Serial CCT monitored with CT 5/9 showing evolving left temporal lobe with decrease in volume, increase in localized vasogenic edema with partial effacement of left lateral ventricle, stable infarct in left posterior temporal occipital lobe and no hydrocephalus.  He did develop lethargy later that day and hypertonic saline as well as Cleviprex resumed.   CT head on 5/11 reviewed, stable.  Dr. Christella Noa evaluated films and felt that no significant changes noted and no need to check further CT as patient neurologically stable. Hypertonic saline and clevidipine weaned off yesterday and BP/neuro status stable.  He is showing increase in verbal output. Neurology recommends Keppra for 2 weeks total for seizure prophylaxis. Fevers and resting tachycardia have resolved.  Therapy ongoing and patient is showing improvement in endurance levels. He continues to be limited by left gaze preference with right inattention, right hemiparesis, receptive > expressive aphasia and CIR recommended due to functional deficits.   Patient transferred to CIR on 04/15/2018 .    Patient currently requires mod with basic self-care skills secondary to muscle weakness, impaired timing and sequencing, motor apraxia, decreased coordination and decreased motor planning, decreased visual perceptual skills, decreased visual motor skills and hemianopsia, decreased attention, decreased awareness, decreased problem solving, decreased safety  awareness and decreased memory and decreased standing balance, decreased postural control, hemiplegia and decreased balance strategies.  Prior to hospitalization, patient could complete ADLs with independent .  Patient will benefit from skilled intervention to decrease level of assist with basic self-care skills prior to discharge home with care partner.  Anticipate patient will require 24 hour supervision and follow up home health.  OT - End of Session Activity Tolerance: Tolerates 30+ min activity with multiple rests Endurance Deficit: Yes Endurance Deficit Description: increasing rest breaks towards end of session OT Assessment Rehab Potential (ACUTE ONLY): Good OT Patient demonstrates impairments in the following area(s): Balance;Cognition;Endurance;Motor;Pain;Safety;Perception;Vision OT Basic ADL's Functional Problem(s): Eating;Grooming;Bathing;Dressing;Toileting OT Transfers Functional Problem(s): Toilet;Tub/Shower OT Additional Impairment(s): Fuctional Use of Upper Extremity OT Plan OT Intensity: Minimum of 1-2 x/day, 45 to 90 minutes OT Frequency: 5 out of 7 days OT Duration/Estimated Length of Stay: 12-14 days OT Treatment/Interventions: Balance/vestibular training;Cognitive remediation/compensation;Discharge planning;Community reintegration;Disease mangement/prevention;DME/adaptive equipment instruction;Functional electrical stimulation;Functional mobility training;Neuromuscular re-education;Pain management;Patient/family education;Psychosocial support;Self Care/advanced ADL retraining;Skin care/wound managment;Splinting/orthotics;Therapeutic Activities;Therapeutic Exercise;UE/LE Strength taining/ROM;UE/LE Coordination activities;Visual/perceptual remediation/compensation OT Self Feeding Anticipated Outcome(s): Supervision/setup OT Basic Self-Care Anticipated Outcome(s): Supervision OT Toileting Anticipated Outcome(s): Supervision OT Bathroom Transfers Anticipated Outcome(s):  Supervision OT Recommendation Patient destination: Home Follow Up Recommendations: Home health OT;24 hour supervision/assistance Equipment Recommended: 3 in 1 bedside comode;Tub/shower seat   Skilled Therapeutic Intervention  OT eval completed with discussion of rehab process, OT purpose, POC, goals, and ELOS.  Pt declined bathing this session, but willing to don shirt.  Pt's daughter present and reporting she had assisted him with LB dressing prior to first therapy session.  Engaged in functional transfers with focus on mobility and Rt attention.  Therapist positioned on pt's Rt to promote attention to Rt as noted to have Rt inattention both seated and with mobility.  Educated on toilet and simulated shower transfer with pt able to complete both with mod assist/HHA for stability.  Attempted to engage in thorough visual assessment, however pt with increased difficulty following directions ?language barrier vs impaired cognition from stroke.  Pt returned to bed at end of session due to fatigue.  Pt's daughter present throughout and assisted with interpretation as pt followed directions better when given in native language but would reply in [place].  OT Evaluation Precautions/Restrictions  Precautions Precautions: Fall Precaution Comments: inattentive R side, word finding deficits  Restrictions Weight Bearing Restrictions: No Vital Signs Therapy Vitals Pulse Rate: 75 BP: 128/74 Pain Pain Assessment Pain Scale: 0-10 Pain Score: 0-No pain Home Living/Prior Functioning Home Living Available Help at Discharge: Family, Available 24 hours/day Type of Home: House Home Access: Stairs to enter [employer] of Steps: 1 step from garage (primary entrance) and 2 at front entrance Entrance Stairs-Rails: None Home Layout: Multi-level, Able to live on main level with bedroom/bathroom Bathroom Shower/Tub: Gaffer, Door [employer] Toilet: Standard  Lives With: Spouse, Daughter Prior  Function Level of Independence: Independent with basic ADLs, Independent with homemaking with ambulation, Independent with gait  Able to Take Stairs?: Yes Vocation: Full time employment Comments: 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  He has two MS degrees  ADL  See Function Navigator Vision Baseline Vision/History: Wears glasses Wears Glasses: At all times Patient Visual Report: Peripheral vision impairment Vision Assessment?: Yes Eye Alignment: Within Functional Limits Ocular Range of Motion: Within Functional Limits Alignment/Gaze Preference: Gaze left Tracking/Visual Pursuits: Decreased smoothness of horizontal tracking;Decreased smoothness of vertical tracking;Requires cues, head turns, or add eye shifts to track;Unable to hold eye position out of midline Saccades: Undershoots;Decreased speed of saccadic movement Visual Fields: Right visual field deficit Additional Comments: Pt required max multimodal cues to attempt visual assessment due to aphasia and language barrier.  Therapist attempting to stabilize head to focus on ocular motions.  Rt inattention with decreased ability to locate stimulus even just Rt of midline Cognition Overall Cognitive Status: Impaired/Different from baseline Arousal/Alertness: Awake/alert Orientation Level: Person Year: 2019 Month: May Day of Week: (Unable to state) Memory: Impaired Immediate Memory Recall: Sock;Bed Attention: Focused;Sustained Focused Attention: Appears intact Sustained Attention: Impaired Awareness: Impaired Problem Solving: Impaired Safety/Judgment: Impaired Sensation Sensation Light Touch: Appears Intact Proprioception: Appears Intact Additional Comments: sensation grossly intact B LEs Coordination Gross Motor Movements are Fluid and Coordinated: No Fine Motor Movements are Fluid and Coordinated: No Coordination and Movement Description: slow and uncoordinated R LE Finger Nose Finger Test: required demonstration cues and  verbal cues in native language with pt still demonstrating decreased ability to complete task Rt or Lt Heel Shin Test: impaired R LE Motor  Motor Motor: Hemiplegia Motor - Skilled Clinical Observations: R hemiparesis  Trunk/Postural Assessment  Cervical Assessment Cervical Assessment: Within Functional Limits Thoracic Assessment Thoracic Assessment: Exceptions to WFL(rounded shoulders) Lumbar Assessment Lumbar Assessment: Within Functional Limits Postural Control Postural Control: Deficits on evaluation Protective Responses: impaired  Balance Balance Balance Assessed: Yes Standardized Balance Assessment Standardized Balance  Assessment: Berg Balance Test Berg Balance Test Sit to Stand: Able to stand  independently using hands Standing Unsupported: Able to stand 2 minutes with supervision Sitting with Back Unsupported but Feet Supported on Floor or Stool: Able to sit 2 minutes under supervision Stand to Sit: Controls descent by using hands Transfers: Able to transfer with verbal cueing and /or supervision Standing Unsupported with Eyes Closed: Able to stand 10 seconds with supervision Standing Ubsupported with Feet Together: Able to place feet together independently but unable to hold for 30 seconds From Standing, Reach Forward with Outstretched Arm: Can reach forward >12 cm safely (5") From Standing Position, Pick up Object from Floor: Unable to pick up and needs supervision From Standing Position, Turn to Look Behind Over each Shoulder: Turn sideways only but maintains balance Turn 360 Degrees: Able to turn 360 degrees safely but slowly Standing Unsupported, Alternately Place Feet on Step/Stool: Able to complete >2 steps/needs minimal assist Standing Unsupported, One Foot in Front: Needs help to step but can hold 15 seconds Standing on One Leg: Tries to lift leg/unable to hold 3 seconds but remains standing independently Total Score: 30 Static Sitting Balance Static Sitting -  Level of Assistance: 5: Stand by assistance Dynamic Sitting Balance Dynamic Sitting - Level of Assistance: 4: Min assist Static Standing Balance Static Standing - Level of Assistance: 4: Min assist Dynamic Standing Balance Dynamic Standing - Level of Assistance: 3: Mod assist Extremity/Trunk Assessment RUE Assessment RUE Assessment: Exceptions to WFL(able to move through full ROM with increased time, noted decreased motor control and strength grossly 4/5.  Loose gross grasp) LUE Assessment LUE Assessment: Within Functional Limits   See Function Navigator for Current Functional Status.   Refer to Care Plan for Long Term Goals  Recommendations for other services: None    Discharge Criteria: Patient will be discharged from OT if patient refuses treatment 3 consecutive times without medical reason, if treatment goals not met, if there is a change in medical status, if patient makes no progress towards goals or if patient is discharged from hospital.  The above assessment, treatment plan, treatment alternatives and goals were discussed and mutually agreed upon: by patient and by family  [facility] 04/16/2018, 12:50 PM

## 2018-04-16 NOTE — Evaluation (Addendum)
Physical Therapy Assessment and Plan  Patient Details  Name: Hector Pugh MRN: [id number] Date of Birth: August 18, 1961  PT Diagnosis: Difficulty walking, Hemiparesis dominant and Impaired cognition Rehab Potential: Good ELOS: 10-12 days   Today's Date: 04/16/2018 PT Individual Time: 0900-1010 PT Individual Time Calculation (min): 70 min    Problem List:  Patient Active Problem List   Diagnosis Date Noted  . Fever   . Hypoalbuminemia due to protein-calorie malnutrition ([place])   . 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 Vascular headache   . Seizure prophylaxis   . Cerebral edema (HCC)   . Atrial fibrillation with rapid ventricular response ([place])   . Pneumonia   . Diabetes mellitus type 2 in nonobese (HCC)   . Benign essential HTN   . History of pancreatic cancer   . FUO (fever of unknown origin)   . Dysphagia, post-stroke   . Tachycardia   . Tachypnea   . Hypothyroidism   . Hypernatremia   . Hypokalemia   . Acute blood loss anemia   . Thrombocytopenia ([place])   . Intracranial hemorrhage ([place]) 04/05/2018  . Acute pancreatitis 06/11/2014  . Headache 06/11/2014  . 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 Obstructive jaundice 11/11/2012  . Biliary stricture 11/11/2012  . Diabetes mellitus ([place]) 11/10/2012  . HTN (hypertension) 11/10/2012  . Pancreatitis 11/10/2012  . Abdominal pain 11/10/2012    Past Medical History:  Past Medical History:  Diagnosis Date  . 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 Cancer ([place])   . Diabetes mellitus ([place]) 11/10/2012  . Diabetes mellitus without complication (HCC)    diet controlled  . HTN (hypertension) 11/10/2012  . Hypertension   . 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 Pancreatic cancer ([place])   . Pancreatitis 11/10/2012  . Seasonal allergies    Past Surgical History:  Past Surgical History:  Procedure Laterality Date  . circucision     . circumsision    . COLONOSCOPY N/A 04/04/2018   Procedure: COLONOSCOPY;  Surgeon: [place] Ruff, MD;  Location: [facility];  Service: Endoscopy;  Laterality: N/A;  . CRANIOTOMY Left 04/05/2018   Procedure: Left Temporal CRANIOTOMY HEMATOMA EVACUATION of Intracranial Hemorrhage;  Surgeon: Ashok Pall, MD;  Location: [facility];  Service: Neurosurgery;  Laterality: Left;  . ERCP  11/08/2012   Procedure: ENDOSCOPIC RETROGRADE CHOLANGIOPANCREATOGRAPHY (ERCP);  Surgeon: Jeryl Columbia, MD;  Location: [facility];  Service: Gastroenterology;  Laterality: N/A;  . EUS  11/08/2012   Procedure: UPPER ENDOSCOPIC ULTRASOUND (EUS) LINEAR;  Surgeon: Arta Silence, MD;  Location: [facility];  Service: Endoscopy;  Laterality: N/A;  . FINE NEEDLE ASPIRATION  11/08/2012   Procedure: FINE NEEDLE ASPIRATION (FNA) LINEAR;  Surgeon: Arta Silence, MD;  Location: [facility];  Service: Endoscopy;  Laterality: N/A;  . HERNIA REPAIR     RIGHT  . LAPAROSCOPY  12/12/2012   Procedure: LAPAROSCOPY DIAGNOSTIC;  Surgeon: Stark Klein, MD;  Location: [facility];  Service: General;  Laterality: N/A;  . Left Foot Surgery    . Left Foot Surgery    . VASECTOMY    . WHIPPLE PROCEDURE  12/12/2012   Procedure: WHIPPLE PROCEDURE;  Surgeon: Stark Klein, MD;  Location: [facility];  Service: General;  Laterality: N/A;    Assessment & Plan Clinical Impression: Patient is a 57 y.o. year old male with history of pancreatic cancer s/p Whipple with onset of DM, HTN who was admitted on 04/05/18 after  found with decreased LOC, vomitus on the bed, difficulty speaking and decreased right sided movement--last seen normal few hours earlier. CT head done revealing large temporal lobe hemorrhage with surrounding edema, mass effect, IVH and mild hydrocephalus with 23 mm midline shift.  CTA head showed large temporal lobe hemorrhage without occlusion, embolus, AVM or aneurysm--bleed felt to be lobar hemorrhage.  He was taken to OR emergently for left  craniotomy and evacuation of hemorrhage by Dr. Christella Noa. Post op placed on Keppra for seizure prophylaxis and started on IV Zosyn due to concerns of aspiration PNA and completed treatment on 5/7.  Hospital course significant for cerebral edema requiring hypertonic saline, coagulopathy with drop in platelets to 113 and INR 1.4, A fib with RVR and labile BP. He was weaned off cleviprex by 5/8 and hypertonic saline by 5/9.  Serial CCT monitored with CT 5/9 showing evolving left temporal lobe with decrease in volume, increase in localized vasogenic edema with partial effacement of left lateral ventricle, stable infarct in left posterior temporal occipital lobe and no hydrocephalus.  He did develop lethargy later that day and hypertonic saline as well as Cleviprex resumed.  CT head on 5/11 reviewed, stable.  Dr. Christella Noa evaluated films and felt that no significant changes noted and no need to check further CT as patient neurologically stable. Hypertonic saline and clevidipine weaned off yesterday and BP/neuro status stable.  He is showing increase in verbal output. Neurology recommends Keppra for 2 weeks total for seizure prophylaxis. Fevers and resting tachycardia have resolved.  Therapy ongoing and patient is showing improvement in endurance levels. He continues to be limited by left gaze preference with right inattention, right hemiparesis, receptive > expressive aphasia and CIR recommended due to functional deficits.  Patient transferred to CIR on 04/15/2018 .   Patient currently requires mod with mobility secondary to muscle weakness, decreased motor planning, decreased attention to right, decreased attention and decreased awareness and decreased standing balance and decreased balance strategies.  Prior to hospitalization, patient was independent  with mobility and lived with Spouse in a House home.  Home access is 2Stairs to enter.  Patient will benefit from skilled PT intervention to maximize safe functional  mobility, minimize fall risk and decrease caregiver burden for planned discharge home with 24 hour supervision.  Anticipate patient will benefit from follow up OP at discharge.  PT - End of Session Activity Tolerance: Tolerates 30+ min activity with multiple rests PT Assessment Rehab Potential (ACUTE/IP ONLY): Good PT Patient demonstrates impairments in the following area(s): Balance;Behavior;Endurance;Motor;Pain;Perception;Safety;Sensory PT Transfers Functional Problem(s): Bed Mobility;Bed to Chair;Car;Furniture;Floor PT Locomotion Functional Problem(s): Ambulation;Wheelchair Mobility;Stairs PT Plan PT Intensity: Minimum of 1-2 x/day ,45 to 90 minutes PT Frequency: 5 out of 7 days PT Duration Estimated Length of Stay: 10-12 days PT Treatment/Interventions: Ambulation/gait training;Disease management/prevention;Pain management;Stair training;Balance/vestibular training;Patient/family education;Therapeutic Activities;Wheelchair propulsion/positioning;DME/adaptive equipment instruction;Cognitive remediation/compensation;Functional electrical stimulation;Psychosocial support;Therapeutic Exercise;Community reintegration;Functional mobility training;Skin care/wound management;UE/LE Strength taining/ROM;Discharge planning;Neuromuscular re-education;Splinting/orthotics;UE/LE Coordination activities PT Transfers Anticipated Outcome(s): supervision PT Locomotion Anticipated Outcome(s): supervision  PT Recommendation Follow Up Recommendations: Outpatient PT Patient destination: Home Equipment Recommended: To be determined  Skilled Therapeutic Intervention Evaluation completed (see details above and below) with education on PT POC and goals and individual treatment initiated with focus on functional mobility, R attention and safety. Pt supine in bed upon PT arrival, performs bed mobility with supervision. Pt performed stand pivot transfer from bed>w/c with min assist. Pt transported to the gym. Pt  performed car transfer from w/c<>car with min assist. Pt ambulated  x 100 ft without AD and mod assist, verbal cues for R attention and foot clearance. Pt ascended/descended 8 steps with B handrails and reciprocal pattern and mod assist. Pt participated in berg balance test as detailed below, discussed results with pt and family. Pt scored 30/56. Pt ambulated from gym>room 200 ft with RW and min assist. Pt left seated in w/c with needs in reach, QRB in place and chair alarm set.    PT Evaluation Precautions/Restrictions Precautions Precautions: Fall Precaution Comments: inattentive R side, word finding deficits  Restrictions Weight Bearing Restrictions: No General   Vital SignsTherapy Vitals Pulse Rate: 75 BP: 128/74 Pain   denies pain Home Living/Prior Functioning Home Living Available Help at Discharge: Family;Available 24 hours/day Type of Home: House Home Access: Stairs to enter [employer] of Steps: 2 Entrance Stairs-Rails: None Home Layout: Multi-level;Able to live on main level with bedroom/bathroom  Lives With: Spouse Prior Function Level of Independence: Independent with gait  Able to Take Stairs?: Yes Vocation: Full time employment Comments: 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  He has two MS degrees  Cognition Overall Cognitive Status: Impaired/Different from baseline Arousal/Alertness: Awake/alert Orientation Level: Oriented to person;Oriented to place Attention: Focused;Sustained Focused Attention: Appears intact Sustained Attention: Impaired Awareness: Impaired Safety/Judgment: Impaired Sensation Sensation Light Touch: Appears Intact Proprioception: Appears Intact Additional Comments: sensation grossly intact B LEs Coordination Gross Motor Movements are Fluid and Coordinated: No Fine Motor Movements are Fluid and Coordinated: No Coordination and Movement Description: slow and uncoordinated R LE Heel Shin Test: impaired R LE Motor  Motor Motor:  Hemiplegia Motor - Skilled Clinical Observations: R hemiparesis  Trunk/Postural Assessment  Cervical Assessment Cervical Assessment: Within Functional Limits Thoracic Assessment Thoracic Assessment: Exceptions to WFL(rounded shoulders) Lumbar Assessment Lumbar Assessment: Within Functional Limits Postural Control Postural Control: Deficits on evaluation Protective Responses: impaired  Balance Balance Balance Assessed: Yes Standardized Balance Assessment Standardized Balance Assessment: Berg Balance Test Berg Balance Test Sit to Stand: Able to stand  independently using hands Standing Unsupported: Able to stand 2 minutes with supervision Sitting with Back Unsupported but Feet Supported on Floor or Stool: Able to sit 2 minutes under supervision Stand to Sit: Controls descent by using hands Transfers: Able to transfer with verbal cueing and /or supervision Standing Unsupported with Eyes Closed: Able to stand 10 seconds with supervision Standing Ubsupported with Feet Together: Able to place feet together independently but unable to hold for 30 seconds From Standing, Reach Forward with Outstretched Arm: Can reach forward >12 cm safely (5") From Standing Position, Pick up Object from Floor: Unable to pick up and needs supervision From Standing Position, Turn to Look Behind Over each Shoulder: Turn sideways only but maintains balance Turn 360 Degrees: Able to turn 360 degrees safely but slowly Standing Unsupported, Alternately Place Feet on Step/Stool: Able to complete >2 steps/needs minimal assist Standing Unsupported, One Foot in Front: Needs help to step but can hold 15 seconds Standing on One Leg: Tries to lift leg/unable to hold 3 seconds but remains standing independently Total Score: 30 Static Sitting Balance Static Sitting - Level of Assistance: 5: Stand by assistance Dynamic Sitting Balance Dynamic Sitting - Level of Assistance: 4: Min assist Static Standing Balance Static  Standing - Level of Assistance: 4: Min assist Dynamic Standing Balance Dynamic Standing - Level of Assistance: 3: Mod assist Extremity Assessment  RLE Assessment RLE Assessment: Exceptions to [facility] RLE Strength Right Hip Flexion: 4/5 Right Hip Extension: 3+/5 Right Knee Flexion: 3+/5 Right Knee Extension: 4/5  Right Ankle Dorsiflexion: 3+/5 Right Ankle Plantar Flexion: 3+/5 LLE Assessment LLE Assessment: Within Functional Limits   See Function Navigator for Current Functional Status.   Refer to Care Plan for Long Term Goals  Recommendations for other services: None   Discharge Criteria: Patient will be discharged from PT if patient refuses treatment 3 consecutive times without medical reason, if treatment goals not met, if there is a change in medical status, if patient makes no progress towards goals or if patient is discharged from hospital.  The above assessment, treatment plan, treatment alternatives and goals were discussed and mutually agreed upon: by patient and by family  Netta Corrigan, PT, DPT 04/16/2018, 10:16 AM

## 2018-04-16 NOTE — Progress Notes (Signed)
[place] PHYSICAL MEDICINE & REHABILITATION     PROGRESS NOTE  Subjective/Complaints:  Pt seen lying in bed this AM.  He states he slept well overnight.  No reported issues.    ROS: Limited due to aphasia  Objective: Vital Signs: Blood pressure 128/74, pulse 75, temperature 98.9 F (37.2 C), temperature source Oral, resp. rate 18, height 5\' 6"  (1.676 m), weight 80.2 kg (176 lb 12.9 oz), SpO2 100 %. No results found. Recent Labs    04/15/18 0225 04/16/18 0558  WBC 8.8 8.6  HGB 9.9* 9.5*  HCT 29.9* 28.5*  PLT 197 234   Recent Labs    04/15/18 0225 04/16/18 0558  NA 138 137  K 3.7 3.5  CL 107 104  GLUCOSE 164* 182*  BUN 15 14  CREATININE 0.90 0.97  CALCIUM 8.2* 8.2*   CBG (last 3)  Recent Labs    04/15/18 1634 04/15/18 2116 04/16/18 0614  GLUCAP 192* 173* 160*    Wt Readings from Last 3 Encounters:  04/16/18 80.2 kg (176 lb 12.9 oz)  04/05/18 72.5 kg (159 lb 13.3 oz)  04/04/18 71.7 kg (158 lb)    Physical Exam:  BP 128/74   Pulse 75   Temp 98.9 F (37.2 C) (Oral)   Resp 18   Ht 5\' 6"  (1.676 m)   Wt 80.2 kg (176 lb 12.9 oz)   SpO2 100%   BMI 28.54 kg/m  Constitutional: He appears well-developed and well-nourished.  HENT: Left crani incision intact with sutures in place.   Eyes: EOMI. No discharge.  Cardiovascular: Normal rate and regular rhythm. No JVD. Respiratory: Effort normal and breath sounds normal.  GI: Bowel sounds are normal.  Musculoskeletal: 1+ pitting edema RLE with resolving ecchymosis on shin. Min edema right hand/forearm.   Neurological: He is alert and oriented x1, confused.  Right inattention with left gaze preference and inability to move eyes to right lateral field.  Right facial weakness with minimal dysarthria. Fluent > Nonfluent aphasia with apraxia.  Motor: LUE/LLE appear to be 5/5 proximal to distal RUE: appears to be 4/5 proximal to distal LUE: appears to be 4+/5 proximal to distal Skin: See above  Psychiatric: Limited  due to aphasia   Assessment/Plan: 1. Functional deficits secondary to left temporal hemorrhage which require 3+ hours per day of interdisciplinary therapy in a comprehensive inpatient rehab setting. Physiatrist is providing close team supervision and 24 hour management of active medical problems listed below. Physiatrist and rehab team continue to assess barriers to discharge/monitor patient progress toward functional and medical goals.  Function:  Bathing Bathing position      Bathing parts      Bathing assist        Upper Body Dressing/Undressing Upper body dressing                    Upper body assist        Lower Body Dressing/Undressing Lower body dressing                                  Lower body assist        Toileting Toileting          Toileting assist     Transfers Chair/bed Clinical biochemist  Cognition Comprehension Comprehension assist level: Follows complex conversation/direction with extra time/assistive device  Expression Expression assist level: Expresses complex 90% of the time/cues < 10% of the time  Social Interaction Social Interaction assist level: Interacts appropriately with others - No medications needed.  Problem Solving Problem solving assist level: Solves basic 90% of the time/requires cueing < 10% of the time  Memory      Medical Problem List and Plan: 1.  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  DVT Prophylaxis/Anticoagulation: Mechanical: Sequential compression devices, below knee Bilateral lower extremities 3. Pain Management: Monitor HA as activity increases. Continue Fioricet.  4. Mood: LCSW to follow for evaluation when appropriate 5. Neuropsych: This patient is not capable of making decisions on his own behalf. 6. Skin/Wound  Care: Monitor incision for healing. Maintain adequate hydration and nutritional status.  7. Fluids/Electrolytes/Nutrition: Monitor I/O.   D3 thin diet, advance as tolerated 8. HTN: Monitor BP bid with SBP goal < 140. Continues to be labile and Norvasc added on 5/9   Monitor with increased mobility 9. T2DM s/p Whipple for pancreatic cancer: Hgb A1C- 6.8.  Monitor with increased mobility 10. Seizure prophylaxis: Continue Keppra bid 11. Cerebral edema: Hypertonic saline and Cleviprex weaned off 5/12--Sodium has normalized.   12.  Fevers:  Aspiration PNA treated--antibiotics d/ced on 5/7.  Temps 97- 99 range with tylenol ordered prn to keep T< 99.5.   Fever overnight   Blood cultures ordered  Urine culture ordered  CXR ordered 13. A fib with RVR: Heart rate controlled with increase in Metoprolol.  Amiodarone has been weaned to oral route--monitor HR bid and with activity.   Monitor with increased mobility 14. Headaches:  Currently using Fioricet. Will continue to monitor 15. ABLA: Monitor for signs of bleeding.   Hb 9.5 on 5/14  Cont to monitor 16. Hypoalbuminemia   Supplement initiated on 5/14  LOS (Days) 1 A FACE TO FACE EVALUATION WAS PERFORMED  Ankit Orrin Yurkovich 04/16/2018 9:15 AM

## 2018-04-16 NOTE — Progress Notes (Signed)
Physical Therapy Session Note  Patient Details  Name: Hector Pugh MRN: [id number] Date of Birth: 1961/10/01  Today's Date: 04/16/2018 PT Individual Time: 7948-0165 PT Individual Time Calculation (min): 15 min   Short Term Goals: Week 1:  PT Short Term Goal 1 (Week 1): Pt will ambulate without AD x 150 ft with min assist PT Short Term Goal 2 (Week 1): Pt will ascend/descend 12 steps with B handrails and min assist PT Short Term Goal 3 (Week 1): Pt will attend to R side during 75% of functional mobility tasks  Skilled Therapeutic Interventions/Progress Updates:    no c/o pain.  Session focus on attention, awareness, and visual scanning.  Pt distracted throughout session, requires cues to attend to task.  Pt asking therapist "what happened?" and therapist providing education regarding reason for admission and rehab process.  Pt demonstrates no intellectual awareness, even after education, stating "but I have no deficits, nothing is wrong with me."  PT reiterated deficits with attention to cognition, weakness, balance, etc.  But no understanding appreciated in pt.  PT provided pt with table top task initially focus on recreating a design of colored pegs from a picture.  Pt unable to determine simple pattern in picture.  Recreated design on peg board and asked pt to recreate, but again unable to do so.  Transition to focus on attention and visual scanning as patient becoming distracted.  Requires cues to correctly select color of each peg from a field of 2.  He was correct in selecting from a field of 2 in 3/4 attempts, however was unable to identify the color without options presented.  Left positioned in bed.  Appears bed is not functioning properly as alarm could not be set; RN aware that all 4 rails up and door open until bed can be assessed.    Therapy Documentation Precautions:  Precautions Precautions: Fall Precaution Comments: inattentive R side, word finding deficits  Restrictions Weight  Bearing Restrictions: No   See Function Navigator for Current Functional Status.   Therapy/Group: Individual Therapy  Michel Santee 04/16/2018, 8:04 PM

## 2018-04-16 NOTE — Evaluation (Signed)
Speech Language Pathology Assessment and Plan  Patient Details  Name: Hector Pugh MRN: [id number] Date of Birth: 1961/04/05  SLP Diagnosis: Aphasia;Apraxia;Cognitive Impairments;Dysphagia  Rehab Potential: Good ELOS: 12 days     Today's Date: 04/16/2018 SLP Individual Time: 4695-0722 SLP Individual Time Calculation (min): 50 min   Problem List:  Patient Active Problem List   Diagnosis Date Noted  . Fever   . Hypoalbuminemia due to protein-calorie malnutrition ([place])   . 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 Vascular headache   . Seizure prophylaxis   . Cerebral edema (HCC)   . Atrial fibrillation with rapid ventricular response ([place])   . Pneumonia   . Diabetes mellitus type 2 in nonobese (HCC)   . Benign essential HTN   . History of pancreatic cancer   . FUO (fever of unknown origin)   . Dysphagia, post-stroke   . Tachycardia   . Tachypnea   . Hypothyroidism   . Hypernatremia   . Hypokalemia   . Acute blood loss anemia   . Thrombocytopenia ([place])   . Intracranial hemorrhage ([place]) 04/05/2018  . Acute pancreatitis 06/11/2014  . Headache 06/11/2014  . 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 Obstructive jaundice 11/11/2012  . Biliary stricture 11/11/2012  . Diabetes mellitus ([place]) 11/10/2012  . HTN (hypertension) 11/10/2012  . Pancreatitis 11/10/2012  . Abdominal pain 11/10/2012   Past Medical History:  Past Medical History:  Diagnosis Date  . 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 Cancer ([place])   . Diabetes mellitus ([place]) 11/10/2012  . Diabetes mellitus without complication (HCC)    diet controlled  . HTN (hypertension) 11/10/2012  . Hypertension   . 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 Pancreatic cancer ([place])   . Pancreatitis 11/10/2012  . Seasonal allergies    Past Surgical History:  Past Surgical History:  Procedure Laterality Date  . circucision    .  circumsision    . COLONOSCOPY N/A 04/04/2018   Procedure: COLONOSCOPY;  Surgeon: Leighton Ruff, MD;  Location: [facility];  Service: Endoscopy;  Laterality: N/A;  . CRANIOTOMY Left 04/05/2018   Procedure: Left Temporal CRANIOTOMY HEMATOMA EVACUATION of Intracranial Hemorrhage;  Surgeon: Ashok Pall, MD;  Location: [facility];  Service: Neurosurgery;  Laterality: Left;  . ERCP  11/08/2012   Procedure: ENDOSCOPIC RETROGRADE CHOLANGIOPANCREATOGRAPHY (ERCP);  Surgeon: Jeryl Columbia, MD;  Location: [facility];  Service: Gastroenterology;  Laterality: N/A;  . EUS  11/08/2012   Procedure: UPPER ENDOSCOPIC ULTRASOUND (EUS) LINEAR;  Surgeon: Arta Silence, MD;  Location: [facility];  Service: Endoscopy;  Laterality: N/A;  . FINE NEEDLE ASPIRATION  11/08/2012   Procedure: FINE NEEDLE ASPIRATION (FNA) LINEAR;  Surgeon: Arta Silence, MD;  Location: [facility];  Service: Endoscopy;  Laterality: N/A;  . HERNIA REPAIR     RIGHT  . LAPAROSCOPY  12/12/2012   Procedure: LAPAROSCOPY DIAGNOSTIC;  Surgeon: Stark Klein, MD;  Location: [facility];  Service: General;  Laterality: N/A;  . Left Foot Surgery    . Left Foot Surgery    . VASECTOMY    . WHIPPLE PROCEDURE  12/12/2012   Procedure: WHIPPLE PROCEDURE;  Surgeon: Stark Klein, MD;  Location: [facility];  Service: General;  Laterality: N/A;    Assessment / Plan / Recommendation Clinical Impression   Hector Pugh is a 57 year old male with history of pancreatic cancer s/p Whipple with onset of DM, HTN who was admitted on 04/05/18  after found with decreased LOC, vomitus on the bed, difficulty speaking and decreased right sided movement--last seen normal few hours earlier. CT head done revealing large temporal lobe hemorrhage with surrounding edema, mass effect, IVH and mild hydrocephalus with 23 mm midline shift.  CTA head showed large temporal lobe hemorrhage without occlusion, embolus, AVM or aneurysm--bleed felt to be lobar hemorrhage.   He was taken to OR emergently  for left craniotomy and evacuation of hemorrhage by Dr. Christella Noa. Post op placed on Keppra for seizure prophylaxis and started on IV Zosyn due to concerns of aspiration PNA and completed treatment on 5/7.  Hospital course significant for cerebral edema requiring hypertonic saline, coagulopathy with drop in platelets to 113 and INR 1.4, A fib with RVR and labile BP. He was weaned off cleviprex by 5/8 and hypertonic saline by 5/9.  Serial CCT monitored with CT 5/9 showing evolving left temporal lobe with decrease in volume, increase in localized vasogenic edema with partial effacement of left lateral ventricle, stable infarct in left posterior temporal occipital lobe and no hydrocephalus.  He did develop lethargy later that day and hypertonic saline as well as Cleviprex resumed.   CT head on 5/11 reviewed, stable.  Dr. Christella Noa evaluated films and felt that no significant changes noted and no need to check further CT as patient neurologically stable. Hypertonic saline and clevidipine weaned off yesterday and BP/neuro status stable.  He is showing increase in verbal output. Neurology recommends Keppra for 2 weeks total for seizure prophylaxis. Fevers and resting tachycardia have resolved.  Therapy ongoing and patient is showing improvement in endurance levels. He continues to be limited by left gaze preference with right inattention, right hemiparesis, receptive > expressive aphasia and CIR recommended due to functional deficits.  Pt presents with Wernicke's aphasia characterized by impaired ability to follow 1 and 2 step commands as well as answering simple yes/no question.  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  As a result, pt needed max assist to convey his needs and wants to caregivers.  It was difficult to formally assess  cognition given pt's language deficits; however, pt presents with right inattention, decreased sustained attention to tasks, and decreased task initiation and sequencing which appear to be secondary to motor planning impairment.   SLP also administered a bedside swallow evaluation on this date.  Pt presents with slowed but functional mastication of regular textures which results in trace diffuse residue of solids which clears with subtle cues for use of liquid wash.  As a result, recommend that pt's diet be advanced to regular textures and thin liquids with full supervision for use of swallowing precautions.   Given the abovementioned deficits, pt would benefit from skilled ST while inpatient in order to maximize functional independence and reduce burden of care prior to discharge.  Anticipate that pt will need 24/7 supervision at discharge in addition to [place] follow up at next level of care, preferably outpatient.     Skilled Therapeutic Interventions          Cognitive-linguistic and bedside swallow evaluation completed with results and recommendations reviewed with patient and family. Pt's daughter had questions regarding techniques to maximize pt's functional communication.  SLP provided skilled education regarding compensatory strategies, emphasizing the need for family members to simplify their output when communicating with pt as well as to provide pt with feedback (facial  expressions, gestures, and verbal cues) to convey when his verbalizations don't appear to match communicative intent.  All questions were answered to her satisfaction at this time.  Pt was left in wheelchair with daughter at bedside.      SLP Assessment  Patient will need skilled Speech Lanaguage Pathology Services during CIR admission    Recommendations  SLP Diet Recommendations: Age appropriate regular solids;Thin Liquid Administration via: Cup;Straw Medication Administration: Whole meds with liquid Supervision: Full  supervision/cueing for compensatory strategies Compensations: Slow rate;Small sips/bites Postural Changes and/or Swallow Maneuvers: Out of bed for meals Oral Care Recommendations: Oral care BID Patient destination: Home Follow up Recommendations: Outpatient SLP;24 hour supervision/assistance Equipment Recommended: None recommended by SLP    SLP Frequency 3 to 5 out of 7 days   SLP Duration  SLP Intensity  SLP Treatment/Interventions 12 days   Minumum of 1-2 x/day, 30 to 90 minutes  Cognitive remediation/compensation;Environmental controls;Multimodal Scientist, research (medical);Internal/external aids;Functional tasks;Dysphagia/aspiration precaution training;Patient/family education    Pain Pain Assessment Pain Scale: 0-10 Pain Score: 0-No pain  Prior Functioning Cognitive/Linguistic Baseline: Within functional limits Type of Home: House  Lives With: Spouse;Daughter Available Help at Discharge: Family;Available 24 hours/day Education: Masters degree Vocation: Full time employment  Function:  Eating Eating                 Cognition Comprehension Comprehension assist level: Understands basic 25 - 49% of the time/ requires cueing 50 - 75% of the time  Expression   Expression assist level: Expresses basic 25 - 49% of the time/requires cueing 50 - 75% of the time. Uses single words/gestures.  Social Interaction Social Interaction assist level: Interacts appropriately 25 - 49% of time - Needs frequent redirection.  Problem Solving Problem solving assist level: Solves basic 25 - 49% of the time - needs direction more than half the time to initiate, plan or complete simple activities  Memory Memory assist level: Recognizes or recalls 25 - 49% of the time/requires cueing 50 - 75% of the time   Short Term Goals: Week 1: SLP Short Term Goal 1 (Week 1): Pt will consume regular textures and thin liquids with mod I use of swallowing precautions to clear residuals from  the oral cavity.   SLP Short Term Goal 2 (Week 1): Pt will follow 1 step commands during functional tasks for 75% accuracy with mod assist multimodal cues.   SLP Short Term Goal 3 (Week 1): Pt will recognize and correct verbal errors at the word level with mod assist multimodal cues.   SLP Short Term Goal 4 (Week 1): Pt will locate items to the right of midline during functional tasks in 75% of opportunities with mod assist multimodal cues.    Refer to Care Plan for Long Term Goals  Recommendations for other services: None   Discharge Criteria: Patient will be discharged from SLP if patient refuses treatment 3 consecutive times without medical reason, if treatment goals not met, if there is a change in medical status, if patient makes no progress towards goals or if patient is discharged from hospital.  The above assessment, treatment plan, treatment alternatives and goals were discussed and mutually agreed upon: by patient  Emilio Math 04/16/2018, 1:18 PM

## 2018-04-17 ENCOUNTER — Inpatient Hospital Stay (HOSPITAL_COMMUNITY): Payer: Managed Care, Other (non HMO) | Admitting: Speech Pathology

## 2018-04-17 ENCOUNTER — Inpatient Hospital Stay (HOSPITAL_COMMUNITY): Payer: Managed Care, Other (non HMO) | Admitting: Physical Therapy

## 2018-04-17 ENCOUNTER — Inpatient Hospital Stay (HOSPITAL_COMMUNITY): Payer: Managed Care, Other (non HMO) | Admitting: Occupational Therapy

## 2018-04-17 ENCOUNTER — Inpatient Hospital Stay (HOSPITAL_COMMUNITY): Payer: Managed Care, Other (non HMO)

## 2018-04-17 DIAGNOSIS — R7309 Other abnormal glucose: Secondary | ICD-10-CM

## 2018-04-17 DIAGNOSIS — R0989 Other specified symptoms and signs involving the circulatory and respiratory systems: Secondary | ICD-10-CM

## 2018-04-17 DIAGNOSIS — J9811 Atelectasis: Secondary | ICD-10-CM

## 2018-04-17 LAB — GLUCOSE, CAPILLARY
GLUCOSE-CAPILLARY: 141 mg/dL — AB (ref 65–99)
GLUCOSE-CAPILLARY: 227 mg/dL — AB (ref 65–99)
GLUCOSE-CAPILLARY: 219 mg/dL — AB (ref 65–99)
Glucose-Capillary: 220 mg/dL — ABNORMAL HIGH (ref 65–99)

## 2018-04-17 LAB — BLOOD CULTURE ID PANEL (REFLEXED)
Acinetobacter baumannii: NOT DETECTED
CANDIDA ALBICANS: NOT DETECTED
CANDIDA GLABRATA: NOT DETECTED
CANDIDA TROPICALIS: NOT DETECTED
Candida krusei: NOT DETECTED
Candida parapsilosis: NOT DETECTED
ENTEROBACTER CLOACAE COMPLEX: NOT DETECTED
ENTEROBACTERIACEAE SPECIES: NOT DETECTED
Enterococcus species: NOT DETECTED
Escherichia coli: NOT DETECTED
Haemophilus influenzae: NOT DETECTED
KLEBSIELLA OXYTOCA: NOT DETECTED
KLEBSIELLA PNEUMONIAE: NOT DETECTED
Listeria monocytogenes: NOT DETECTED
Methicillin resistance: DETECTED — AB
NEISSERIA MENINGITIDIS: NOT DETECTED
PSEUDOMONAS AERUGINOSA: NOT DETECTED
Proteus species: NOT DETECTED
STREPTOCOCCUS PNEUMONIAE: NOT DETECTED
STREPTOCOCCUS PYOGENES: NOT DETECTED
Serratia marcescens: NOT DETECTED
Staphylococcus aureus (BCID): NOT DETECTED
Staphylococcus species: DETECTED — AB
Streptococcus agalactiae: NOT DETECTED
Streptococcus species: NOT DETECTED

## 2018-04-17 NOTE — Progress Notes (Addendum)
[facility] PHYSICAL MEDICINE & REHABILITATION     PROGRESS NOTE  Subjective/Complaints:  Patient seen lying in bed this morning. He states he slept well overnight. No reported issues overnight.  ROS: Limited due to aphasia  Objective: Vital Signs: Blood pressure 126/74, pulse 88, temperature 98.5 F (36.9 C), temperature source Oral, resp. rate 18, height 5\' 6"  (1.676 m), weight 76.4 kg (168 lb 6.9 oz), SpO2 99 %. Dg Chest 2 View  Result Date: 04/16/2018 CLINICAL DATA:  Fever EXAM: CHEST - 2 VIEW COMPARISON:  04/11/2018 FINDINGS: Left arm PICC has been removed. Hypoventilation with slight bibasilar atelectasis. Negative for heart failure or pneumonia or effusion. IMPRESSION: Hypoventilation with mild bibasilar atelectasis. Electronically Signed   By: Franchot Gallo M.D.   On: 04/16/2018 16:03   Recent Labs    04/15/18 0225 04/16/18 0558  WBC 8.8 8.6  HGB 9.9* 9.5*  HCT 29.9* 28.5*  PLT 197 234   Recent Labs    04/15/18 0225 04/16/18 0558  NA 138 137  K 3.7 3.5  CL 107 104  GLUCOSE 164* 182*  BUN 15 14  CREATININE 0.90 0.97  CALCIUM 8.2* 8.2*   CBG (last 3)  Recent Labs    04/16/18 1706 04/16/18 2209 04/17/18 0656  GLUCAP 140* 199* 141*    Wt Readings from Last 3 Encounters:  04/17/18 76.4 kg (168 lb 6.9 oz)  04/05/18 72.5 kg (159 lb 13.3 oz)  04/04/18 71.7 kg (158 lb)    Physical Exam:  BP 126/74   Pulse 88   Temp 98.5 F (36.9 C) (Oral)   Resp 18   Ht 5\' 6"  (1.676 m)   Wt 76.4 kg (168 lb 6.9 oz)   SpO2 99%   BMI 27.19 kg/m  Constitutional: He appears well-developed and well-nourished.  HENT: Left crani incision intact with sutures in place.   Eyes: EOMI. No discharge.  Cardiovascular: RRR. No JVD. Respiratory: Effort normal and breath sounds normal.  GI: Bowel sounds are normal.  Musculoskeletal: 1+ pitting edema RLE with resolving ecchymosis on shin. Min edema right hand/forearm.   Neurological: He is alert and oriented x1, confused, stable.   Right inattention with left gaze preference and inability to move eyes to right lateral field, unchanged.  Right facial weakness with minimal dysarthria. Fluent > Nonfluent aphasia with apraxia.  Motor: LUE/LLE appear to be 5/5 proximal to distal RUE: appears to be 4+-/5 proximal to distal RLE: appears to be 4+/5 proximal to distal Skin: See above  Psychiatric: Limited due to aphasia   Assessment/Plan: 1. Functional deficits secondary to left temporal hemorrhage which require 3+ hours per day of interdisciplinary therapy in a comprehensive inpatient rehab setting. Physiatrist is providing close team supervision and 24 hour management of active medical problems listed below. Physiatrist and rehab team continue to assess barriers to discharge/monitor patient progress toward functional and medical goals.  Function:  Bathing Bathing position Bathing activity did not occur: Refused    Bathing parts      Bathing assist        Upper Body Dressing/Undressing Upper body dressing   What is the patient wearing?: Pull over shirt/dress     Pull over shirt/dress - Perfomed by patient: Thread/unthread right sleeve, Thread/unthread left sleeve Pull over shirt/dress - Perfomed by helper: Put head through opening, Pull shirt over trunk        Upper body assist Assist Level: (Mod assist)      Lower Body Dressing/Undressing Lower body dressing   What  is the patient wearing?: Underwear, Pants, Non-skid slipper socks Underwear - Performed by patient: Pull underwear up/down Underwear - Performed by helper: Thread/unthread right underwear leg, Thread/unthread left underwear leg Pants- Performed by patient: Pull pants up/down Pants- Performed by helper: Thread/unthread right pants leg, Thread/unthread left pants leg   Non-skid slipper socks- Performed by helper: Don/doff right sock, Don/doff left sock                  Lower body assist Assist for lower body dressing: (Max assist)       Toileting Toileting   Toileting steps completed by patient: Adjust clothing prior to toileting, Performs perineal hygiene Toileting steps completed by helper: Adjust clothing after toileting    Toileting assist Assist level: Touching or steadying assistance (Pt.75%)   Transfers Chair/bed transfer   Chair/bed transfer method: Stand pivot Chair/bed transfer assist level: Touching or steadying assistance (Pt > 75%) Chair/bed transfer assistive device: Armrests     Locomotion Ambulation     Max distance: 100 ft Assist level: Moderate assist (Pt 50 - 74%)   Wheelchair          Cognition Comprehension Comprehension assist level: Understands basic 25 - 49% of the time/ requires cueing 50 - 75% of the time  Expression Expression assist level: Expresses basic 25 - 49% of the time/requires cueing 50 - 75% of the time. Uses single words/gestures.  Social Interaction Social Interaction assist level: Interacts appropriately 25 - 49% of time - Needs frequent redirection.  Problem Solving Problem solving assist level: Solves basic 25 - 49% of the time - needs direction more than half the time to initiate, plan or complete simple activities  Memory Memory assist level: Recognizes or recalls 25 - 49% of the time/requires cueing 50 - 75% of the time    Medical Problem List and Plan: 1.  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  DVT Prophylaxis/Anticoagulation: Mechanical: Sequential compression devices, below knee Bilateral lower extremities 3. Pain Management: Monitor HA as activity increases. Continue Fioricet.  4. Mood: LCSW to follow for evaluation when appropriate 5. Neuropsych: This patient is not capable of making decisions on his own behalf. 6. Skin/Wound Care: Monitor incision for healing. Maintain adequate hydration and nutritional status.  7.  Fluids/Electrolytes/Nutrition: Monitor I/O.   Advanced to regular texture diet 8. HTN: Monitor BP bid with SBP goal < 140. Norvasc added on 5/9   Labile on 5/15  Monitor with increased mobility 9. T2DM s/p Whipple for pancreatic cancer: Hgb A1C- 6.8.  Labile on 5/15  Monitor with increased mobility 10. Seizure prophylaxis: Continue Keppra bid 11. Cerebral edema: Hypertonic saline and Cleviprex weaned off 5/12--Sodium has normalized.   12.  Fevers:  Aspiration PNA treated--antibiotics d/ced on 5/7.    ?Of central origin +/- atelectasis  Blood cultures pending  Urine culture pending  CXR reviewed, showing atelectasis. Encourage IS 13. A fib with RVR: Heart rate controlled with increase in Metoprolol.  Amiodarone has been weaned to oral route--monitor HR bid and with activity.   Monitor with increased mobility 14. Headaches:  Currently using Fioricet. Will continue to monitor 15. ABLA: Monitor for signs of bleeding.   Hb 9.5 on 5/14  Cont to monitor 16. Hypoalbuminemia   Supplement initiated on 5/14  LOS (Days) 2 A FACE TO FACE EVALUATION WAS PERFORMED  Kiah Vanalstine Cyler Kappes 04/17/2018 8:53 AM

## 2018-04-17 NOTE — Progress Notes (Signed)
Physical Therapy Session Note  Patient Details  Name: Hector Pugh MRN: [id number] Date of Birth: [birth year]  Today's Date: 04/17/2018 PT Individual Time:1305-1400   63mn   Short Term Goals: Week 1:  PT Short Term Goal 1 (Week 1): Pt will ambulate without AD x 150 ft with min assist PT Short Term Goal 2 (Week 1): Pt will ascend/descend 12 steps with B handrails and min assist PT Short Term Goal 3 (Week 1): Pt will attend to R side during 75% of functional mobility tasks  Skilled Therapeutic Interventions/Progress Updates:   Pt received sitting in WC and agreeable to PT  Gait training without AD x [id number] min assist overall. Dynamic gait training to weave around 6 cones x 4 with min-mod assist from PT to prevent LOB to the R x 2 and improve awareness of cones on the R side 50% of the time. Gait in simulate home environment, to weave through furniture in day room 6071f 3 with min assist overall. Pt noted to intermittently furniture walk with only slight decrease with cues form PT. One near LOB by hitting leg rest of chair on pt's LLE. Seated rest breaks following each bout of dynamic gait training due to fatigue.   Dynamic standing balance and coordination training: reciprocal foot taps on 6 inch step 2 x 8 BLE with intermittent 0-1 UE support on rail. Lateral foot taps x 8 BLE with no UE support and min-mod assist from PT. Stepping over 1-2 obstacles (6") with mod assist from PT. Improved safety noted when leading with the RLE.   Patient returned to room and left sitting in WC [facility] call bell in reach and all needs met.          Therapy Documentation Precautions:  Precautions Precautions: Fall Precaution Comments: inattentive R side, word finding deficits  Restrictions Weight Bearing Restrictions: No Pain: Pain Assessment Pain Scale: 0-10 Pain Score: 0-No pain   See Function Navigator for Current Functional Status.   Therapy/Group: Individual Therapy  AusLorie Phenix15/2019, 1:18 PM

## 2018-04-17 NOTE — Progress Notes (Signed)
Speech Language Pathology Daily Session Note  Patient Details  Name: Hector Pugh MRN: [id number] Date of Birth: 1961-06-29  Today's Date: 04/17/2018 SLP Individual Time: 0915-1010 SLP Individual Time Calculation (min): 55 min  Short Term Goals: Week 1: SLP Short Term Goal 1 (Week 1): Pt will consume regular textures and thin liquids with mod I use of swallowing precautions to clear residuals from the oral cavity.   SLP Short Term Goal 2 (Week 1): Pt will follow 1 step commands during functional tasks for 75% accuracy with mod assist multimodal cues.   SLP Short Term Goal 3 (Week 1): Pt will recognize and correct verbal errors at the word level with mod assist multimodal cues.   SLP Short Term Goal 4 (Week 1): Pt will locate items to the right of midline during functional tasks in 75% of opportunities with mod assist multimodal cues.    Skilled Therapeutic Interventions:  Pt was seen for skilled ST targeting communication goals.  Pt was able to match object to written word from a field of two for ~50% accuracy with mod-max assist cues to recognize and correct errors.  Pt could not generate the names of targeted items without total assist but could repeat the name of the object in repetitions of 3 for 90% accuracy independently which improved to 100% accuracy with max assist verbal and visual cues to recognize and correct substitution errors.  Pt needed max assist to write basic, biographical information from a written model due to decreased sequencing and organization as well as perseveration during letter formation.  Pt presented with questions regarding the nature of his difficulty with communication and SLP provided skilled education with written aids regarding location of CVA as it pertains to his current deficits.  Pt was returned to room and left in wheelchair with call bell within reach, chair alarm set, and quick release belt donned.  Pt could return demonstration for use of call bell with  min assist.  Continue per current plan of care.    Function:  Eating Eating                 Cognition Comprehension Comprehension assist level: Understands basic 25 - 49% of the time/ requires cueing 50 - 75% of the time  Expression   Expression assist level: Expresses basic 25 - 49% of the time/requires cueing 50 - 75% of the time. Uses single words/gestures.  Social Interaction Social Interaction assist level: Interacts appropriately 50 - 74% of the time - May be physically or verbally inappropriate.  Problem Solving Problem solving assist level: Solves basic 25 - 49% of the time - needs direction more than half the time to initiate, plan or complete simple activities  Memory Memory assist level: Recognizes or recalls 25 - 49% of the time/requires cueing 50 - 75% of the time    Pain Pain Assessment Pain Scale: 0-10 Pain Score: 0-No pain  Therapy/Group: Individual Therapy  Liberta Gimpel, Selinda Orion 04/17/2018, 12:45 PM

## 2018-04-17 NOTE — Sleep Study (Signed)
Slept most of the night. Easy to awake but not easy to keep awake during conversation. B/P remains elevated. Temp was 99 last night. Tylenol was given and effective. Temp was 97.36 when rechecked. C/o headache this morning. Tylenol was given.

## 2018-04-17 NOTE — Progress Notes (Signed)
Occupational Therapy Session Note  Patient Details  Name: Javares Kaufhold MRN: [id number] Date of Birth: 01/01/1961  Today's Date: 04/17/2018 OT Individual Time: 7494-4967 and 1120-1200 OT Individual Time Calculation (min): 45 min and 40 min   Short Term Goals: Week 1:  OT Short Term Goal 1 (Week 1): Pt will complete bathing with min assist at sit > stand level OT Short Term Goal 2 (Week 1): Pt will complete LB dressing with min assist at sit > stand level OT Short Term Goal 3 (Week 1): Pt will complete toilet transfer with min assist with LRAD OT Short Term Goal 4 (Week 1): Pt will complete 2 grooming tasks in standing with min cues  Skilled Therapeutic Interventions/Progress Updates:    1) Treatment session with focus on ADL retraining with initiation and sequencing familiar tasks of bathing and dressing.  Pt ambulated to room shower with RW with mod cues for attention to Rt visual field as pt bumping in to items on Rt.  Min multimodal cues for initiation for bathing, once started pt able to sequence bathing without additional cues.  Provided cues for thoroughness to wash buttocks as pt stating he was finished after washing upper and lower body at seated level.  Min cues for sequencing and orientation of clothing with LB dressing, pt demonstrating difficulty with orientation of pants and only donned Lt sock requiring cues to attend to Rt foot.  Completed grooming tasks seated with setup for items, pt applying toothpaste appropriately and utilizing appropriate items to complete tasks.    2) Treatment session with focus on standing balance, Rt attention, and functional use of RUE.  Pt received upright in w/c willing to engage in therapy session.  Engaged in sit > stand at high-low table with min guard - supervision for transitional movement and when standing.  Engaged in visual scanning with focus on replicating patterns with resistive clothespins and then small colored pegs with pt requiring  initial cue to follow pattern with pt able to replicate without additional cues.  Min cues to utilize RUE when picking up clothespins and pegs alternating crossing midline with RUE and obtaining items in Rt visual field.  Pt with increased difficulty locating items in Rt visual field, however when cued pt would turn head to Rt and would be able to locate items.  Pt attended to task appropriately despite moderately distracting environment.    Therapy Documentation Precautions:  Precautions Precautions: Fall Precaution Comments: inattentive R side, word finding deficits  Restrictions Weight Bearing Restrictions: No General:   Vital Signs: Therapy Vitals Pulse Rate: 88 BP: 126/74 Pain: Pain Assessment Pain Score: 0-No pain  See Function Navigator for Current Functional Status.   Therapy/Group: Individual Therapy  Simonne Come 04/17/2018, 10:08 AM

## 2018-04-17 NOTE — Progress Notes (Signed)
Went to sleep around 1600-1700.   

## 2018-04-18 ENCOUNTER — Inpatient Hospital Stay (HOSPITAL_COMMUNITY): Payer: Managed Care, Other (non HMO) | Admitting: Speech Pathology

## 2018-04-18 ENCOUNTER — Inpatient Hospital Stay (HOSPITAL_COMMUNITY): Payer: Managed Care, Other (non HMO) | Admitting: Physical Therapy

## 2018-04-18 ENCOUNTER — Inpatient Hospital Stay (HOSPITAL_COMMUNITY): Payer: Managed Care, Other (non HMO) | Admitting: Occupational Therapy

## 2018-04-18 ENCOUNTER — Ambulatory Visit (HOSPITAL_COMMUNITY): Payer: Managed Care, Other (non HMO) | Attending: Physical Medicine and Rehabilitation

## 2018-04-18 DIAGNOSIS — R609 Edema, unspecified: Secondary | ICD-10-CM | POA: Diagnosis not present

## 2018-04-18 DIAGNOSIS — R531 Weakness: Secondary | ICD-10-CM

## 2018-04-18 DIAGNOSIS — I82491 Acute embolism and thrombosis of other specified deep vein of right lower extremity: Secondary | ICD-10-CM | POA: Insufficient documentation

## 2018-04-18 DIAGNOSIS — N390 Urinary tract infection, site not specified: Secondary | ICD-10-CM

## 2018-04-18 DIAGNOSIS — R5081 Fever presenting with conditions classified elsewhere: Secondary | ICD-10-CM

## 2018-04-18 LAB — URINE CULTURE: Culture: >=100000 — AB

## 2018-04-18 LAB — GLUCOSE, CAPILLARY
GLUCOSE-CAPILLARY: 150 mg/dL — AB (ref 65–99)
Glucose-Capillary: 247 mg/dL — ABNORMAL HIGH (ref 65–99)
Glucose-Capillary: 207 mg/dL — ABNORMAL HIGH (ref 65–99)
Glucose-Capillary: 162 mg/dL — ABNORMAL HIGH (ref 65–99)

## 2018-04-18 MED ORDER — APIXABAN 5 MG PO TABS
10.0000 mg | ORAL_TABLET | Freq: Two times a day (BID) | ORAL | Status: DC
  Administered 2018-04-18 – 2018-04-20 (×5): 10 mg via ORAL
  Filled 2018-04-18 (×5): qty 2

## 2018-04-18 MED ORDER — APIXABAN 5 MG PO TABS: 5.0000 mg | ORAL_TABLET | Freq: Two times a day (BID) | ORAL | Status: DC

## 2018-04-18 MED ORDER — AMOXICILLIN 500 MG PO CAPS
500.0000 mg | ORAL_CAPSULE | Freq: Three times a day (TID) | ORAL | Status: AC
  Administered 2018-04-18 – 2018-04-25 (×21): 500 mg via ORAL
  Filled 2018-04-18 (×22): qty 1

## 2018-04-18 MED ORDER — PREMIER PROTEIN SHAKE
11.0000 [oz_av] | Freq: Three times a day (TID) | ORAL | Status: DC
  Administered 2018-04-18 – 2018-04-26 (×20): 11 [oz_av] via ORAL
  Filled 2018-04-18 (×35): qty 325.31

## 2018-04-18 NOTE — Patient Care Conference (Addendum)
Inpatient RehabilitationTeam Conference and Plan of Care Update Date: 04/17/2018   Time: 2:45 PM    Patient Name: Hector Pugh      Medical Record Number: [id number]  Date of Birth: [birth year] Sex: Male         Room/Bed: 4M10C/4M10C-01 Payor Info: Payor: [employer] / Plan: Market researcher / Product Type: *No Product type* /    Admitting Diagnosis: CVA  Admit Date/Time:  04/15/2018  2:30 PM Admission Comments: No comment available   Primary Diagnosis:  <principal problem not specified> Principal Problem: <principal problem not specified>  Patient Active Problem List   Diagnosis Date Noted  . Acute lower UTI   . Atelectasis   . Labile blood pressure   . Labile blood glucose   . Fever   . Hypoalbuminemia due to protein-calorie malnutrition ([place])   . 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 Vascular headache   . Seizure prophylaxis   . Cerebral edema (HCC)   . Atrial fibrillation with rapid ventricular response ([place])   . Pneumonia   . Diabetes mellitus type 2 in nonobese (HCC)   . Benign essential HTN   . History of pancreatic cancer   . FUO (fever of unknown origin)   . Dysphagia, post-stroke   . Tachycardia   . Tachypnea   . Hypothyroidism   . Hypernatremia   . Hypokalemia   . Acute blood loss anemia   . Thrombocytopenia ([place])   . Intracranial hemorrhage ([place]) 04/05/2018  . Acute pancreatitis 06/11/2014  . Headache 06/11/2014  . 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 Obstructive jaundice 11/11/2012  . Biliary stricture 11/11/2012  . Diabetes mellitus ([place]) 11/10/2012  . HTN (hypertension) 11/10/2012  . Pancreatitis 11/10/2012  . Abdominal pain 11/10/2012    Expected Discharge Date: Expected Discharge Date: 04/27/18  Team Members Present: Physician leading conference: Dr. 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 min-mod assist ambulation without AD. supervision assist bed mobility. R inattention   Supervision assist overall for transfers, gait and bed mobilty   improved R in attention. improved safety awareness. balance, improved independence with transfers and gait.    Communication   max assist   min assist   comprehension of verbal and written information, awareness of verbal errors.    Safety/Cognition/ Behavioral Observations  mod assist   min assist   visual scanning to the right of midline, awareness of deficits, safety awareness, sustained attention to tasks    Pain             Skin  Rehab Goals Patient on target to meet rehab goals: Yes *See Care Plan and progress notes for long and short-term goals.     Barriers to Discharge  Current Status/Progress Possible Resolutions Date Resolved   Physician    Medical stability;Other (comments)  Fevers  See above  Therapies, optimize HTN/DM meds, follow infectious workup      Nursing                  PT                    OT                  SLP                SW                Discharge Planning/Teaching Needs:  Plan for pt to d/c  home with family providing 24/7 assistance.  Teaching ongoing.    Team Discussion:  BP slightly elevated;  Still with fevers and ID consult placed.  Supervision to min assist overall.  amb min - mad due to balance and right inattention.  ST following for rec aphasia and verbal apraxia - ST feels they do not need an interpreter.  Revisions to Treatment Plan:  NA    Continued Need for Acute Rehabilitation Level of Care: The patient requires daily medical management by a physician with specialized training in physical medicine and rehabilitation for the following conditions: Daily direction of a multidisciplinary physical rehabilitation program to ensure safe treatment while eliciting the highest outcome that is of practical value to the patient.: Yes Daily medical management of patient stability for increased activity during participation in an intensive rehabilitation regime.: Yes Daily analysis of laboratory values and/or radiology reports with any subsequent need for medication adjustment of medical intervention for : Blood pressure problems;Diabetes problems;Other;Neurological problems;Wound care problems  Hector Pugh 04/19/2018, 9:35 AM

## 2018-04-18 NOTE — Progress Notes (Signed)
Bilateral lower extremity venous duplex has been completed. There is evidence of acute deep vein thrombosis involving the gastrocnemius, and soleal veins of the right lower extremity. Negative for DVT on the left. Results were given to the patient's nurse, Earlie Server.  04/18/18 11:25 AM Hector Pugh RVT

## 2018-04-18 NOTE — Progress Notes (Signed)
Social Work Patient ID: Hector Pugh, male   DOB: [birth year], 59 y.o.   MRN: [id number]   Have reviewed team conference with pt and daughter.  Both aware of targeted d/c date 5/25 and supervision goals overall.  No concerns at this time.  Will continue to follow.  Dalton Mille, LCSW

## 2018-04-18 NOTE — Care Management (Signed)
Inpatient [facility] Individual Statement of Services  Patient Name:  Hector Pugh  Date:  04/18/2018  Welcome to the [facility].  Our goal is to provide you with an individualized program based on your diagnosis and situation, designed to meet your specific needs.  With this comprehensive rehabilitation program, you will be expected to participate in at least 3 hours of rehabilitation therapies Monday-Friday, with modified therapy programming on the weekends.  Your rehabilitation program will include the following services:  Physical Therapy (PT), Occupational Therapy (OT), Speech Therapy (ST), 24 hour per day rehabilitation nursing, Therapeutic Recreaction (TR), Neuropsychology, Case Management (Social Worker), Rehabilitation Medicine, Nutrition Services and Pharmacy Services  Weekly team conferences will be held on Wednesdays to discuss your progress.  Your Social Worker will talk with you frequently to get your input and to update you on team discussions.  Team conferences with you and your family in attendance may also be held.  Expected length of stay: 12-14 days    Overall anticipated outcome: supervision  Depending on your progress and recovery, your program may change. Your Social Worker will coordinate services and will keep you informed of any changes. Your Social Worker's name and contact numbers are listed  below.  The following services may also be recommended but are not provided by the [facility] will be made to provide these services after discharge if needed.  Arrangements include referral to agencies that provide these services.  Your insurance has been verified to be:  [place] Your primary doctor is:  Arvind  Pertinent information will be shared with your doctor and your  insurance company.  Social Worker:  [place], [place] or [phone]   Information discussed with and copy given to patient by: Lennart Pall, 04/18/2018, 4:44 PM

## 2018-04-18 NOTE — Sleep Study (Signed)
Slept all night. Woke up twice to urinate.

## 2018-04-18 NOTE — Progress Notes (Signed)
Speech Language Pathology Daily Session Note  Patient Details  Name: Hector Pugh MRN: [id number] Date of Birth: 07-30-1961  Today's Date: 04/18/2018 SLP Individual Time: 9563-8756 SLP Individual Time Calculation (min): 57 min  Short Term Goals: Week 1: SLP Short Term Goal 1 (Week 1): Pt will consume regular textures and thin liquids with mod I use of swallowing precautions to clear residuals from the oral cavity.   SLP Short Term Goal 2 (Week 1): Pt will follow 1 step commands during functional tasks for 75% accuracy with mod assist multimodal cues.   SLP Short Term Goal 3 (Week 1): Pt will recognize and correct verbal errors at the word level with mod assist multimodal cues.   SLP Short Term Goal 4 (Week 1): Pt will locate items to the right of midline during functional tasks in 75% of opportunities with mod assist multimodal cues.    Skilled Therapeutic Interventions:  Pt was seen for skilled ST targeting goals for dysphagia and communication.  Pt was received sitting upright in wheelchair at the sink brushing his teeth.  Pt consumed 100% of his breakfast tray which consisted entirely of dys 3 textures with mod I use of swallowing precautions and min assist verbal cues for initiation and sequencing of tray set up.  No overt s/s of aspiration with solids or liquids.  Following meal, pt match written word to object from a field of 2 for 100% accuracy with min assist.  He continues to have difficulty producing the names of object on command but did have 1 instance of spontaneous, accurate naming of object.  Pt had decreased searching behaviors when naming objects and was quicker to correct phoneme substitution errors with min-mod assist.  Pt was left in wheelchair with call bell within reach and quick release belt donned.  Daughter updated regarding goals of treatment session and progress.  Continue per current plan of care.    Function:  Eating Eating   Modified Consistency Diet: No Eating  Assist Level: Supervision or verbal cues           Cognition Comprehension Comprehension assist level: Understands basic 50 - 74% of the time/ requires cueing 25 - 49% of the time  Expression   Expression assist level: Expresses basic 50 - 74% of the time/requires cueing 25 - 49% of the time. Needs to repeat parts of sentences.  Social Interaction Social Interaction assist level: Interacts appropriately 75 - 89% of the time - Needs redirection for appropriate language or to initiate interaction.  Problem Solving Problem solving assist level: Solves basic 50 - 74% of the time/requires cueing 25 - 49% of the time  Memory Memory assist level: Recognizes or recalls 25 - 49% of the time/requires cueing 50 - 75% of the time    Pain Pain Assessment Pain Scale: 0-10 Pain Score: 0-No pain  Therapy/Group: Individual Therapy  Mathis Cashman, Selinda Orion 04/18/2018, 9:04 AM

## 2018-04-18 NOTE — Progress Notes (Signed)
Social Work  Social Work Assessment and Plan  Patient Details  Name: Hector Pugh MRN: [id number] Date of Birth: 11/30/1961  Today's Date: 04/18/2018  Problem List:  Patient Active Problem List   Diagnosis Date Noted  . Acute lower UTI   . Atelectasis   . Labile blood pressure   . Labile blood glucose   . Fever   . Hypoalbuminemia due to protein-calorie malnutrition ([place])   . 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 Vascular headache   . Seizure prophylaxis   . Cerebral edema (HCC)   . Atrial fibrillation with rapid ventricular response ([place])   . Pneumonia   . Diabetes mellitus type 2 in nonobese (HCC)   . Benign essential HTN   . History of pancreatic cancer   . FUO (fever of unknown origin)   . Dysphagia, post-stroke   . Tachycardia   . Tachypnea   . Hypothyroidism   . Hypernatremia   . Hypokalemia   . Acute blood loss anemia   . Thrombocytopenia ([place])   . Intracranial hemorrhage ([place]) 04/05/2018  . Acute pancreatitis 06/11/2014  . Headache 06/11/2014  . 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 Obstructive jaundice 11/11/2012  . Biliary stricture 11/11/2012  . Diabetes mellitus ([place]) 11/10/2012  . HTN (hypertension) 11/10/2012  . Pancreatitis 11/10/2012  . Abdominal pain 11/10/2012   Past Medical History:  Past Medical History:  Diagnosis Date  . 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 Cancer ([place])   . Diabetes mellitus ([place]) 11/10/2012  . Diabetes mellitus without complication (HCC)    diet controlled  . HTN (hypertension) 11/10/2012  . Hypertension   . 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 Pancreatic cancer ([place])   . Pancreatitis 11/10/2012  . Seasonal allergies    Past Surgical History:  Past Surgical History:  Procedure Laterality Date  . circucision    . circumsision    . COLONOSCOPY N/A 04/04/2018   Procedure: COLONOSCOPY;  Surgeon: Leighton Ruff, MD;   Location: [facility];  Service: Endoscopy;  Laterality: N/A;  . CRANIOTOMY Left 04/05/2018   Procedure: Left Temporal CRANIOTOMY HEMATOMA EVACUATION of Intracranial Hemorrhage;  Surgeon: Ashok Pall, MD;  Location: [facility];  Service: Neurosurgery;  Laterality: Left;  . ERCP  11/08/2012   Procedure: ENDOSCOPIC RETROGRADE CHOLANGIOPANCREATOGRAPHY (ERCP);  Surgeon: Jeryl Columbia, MD;  Location: [facility];  Service: Gastroenterology;  Laterality: N/A;  . EUS  11/08/2012   Procedure: UPPER ENDOSCOPIC ULTRASOUND (EUS) LINEAR;  Surgeon: Arta Silence, MD;  Location[facility];  Service: Endoscopy;  Laterality: N/A;  . FINE NEEDLE ASPIRATION  11/08/2012   Procedure: FINE NEEDLE ASPIRATION (FNA) LINEAR;  Surgeon: Arta Silence, MD;  Location[facility];  Service: Endoscopy;  Laterality: N/A;  . HERNIA REPAIR     RIGHT  . LAPAROSCOPY  12/12/2012   Procedure: LAPAROSCOPY DIAGNOSTIC;  Surgeon: Stark Klein, MD;  Location: [facility];  Service: General;  Laterality: N/A;  . Left Foot Surgery    . Left Foot Surgery    . VASECTOMY    . WHIPPLE PROCEDURE  12/12/2012   Procedure: WHIPPLE PROCEDURE;  Surgeon: Stark Klein, MD;  Location: [facility];  Service: General;  Laterality: N/A;   Social History:  reports that he has never smoked. He has never used smokeless tobacco. He reports that he does not drink alcohol or use drugs.  Family / Support Systems Marital Status: Married How Long?: 53 yrs  Patient Roles: Spouse, Parent Spouse/Significant Other: wife, Sofia Jaquith @ (H) [phone] or [phone] Children: daughter, Marlen Mollica (living in the home with pt) @ (C) [phone];  son, Kartik Fernando [facility]) Other Supports: two sets of very close friends living locally and daughter notes they will be part of care team as needed. Anticipated Caregiver: Wife, daughter and other family/ friend as needed. Ability/Limitations of Caregiver: None Caregiver Availability: 24/7 Family Dynamics: Pt's family are  very close and supportive with him.  Daughter notes that family in [place] is ready to help with care rotation at d/c as well.  Social History Preferred language: English Religion: Hindu Cultural Background: Pt originally from [place] and came to [place]. in 1983. 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  He does not engage much with me other than to offer very brief answers.  There are known speech deficits as well.  Daughter notes he has not expressed much emotional distress but feels that "he's just starting to realize everything that has happended."  Will monitor this and refer for neuropsychology as indicated Recent Psychosocial Issues: None Pyschiatric History: None Substance Abuse History: none  Patient / Family Perceptions, Expectations & Goals Pt/Family understanding of illness & functional limitations: Family/ daughter (a Therapist, sports at [employer]) with very good understanding of all the medical issues and of functional limitations/ need for CIR. Premorbid pt/family roles/activities: Pt completely independent and working f/t. Anticipated changes in roles/activities/participation: Anticipated that pt will need 24/7 supervision upon d/c and family prepared to assume all caregiver roles. Pt/family expectations/goals: per daughter, "I hope he can make a good recovery... not  sure about him getting back to his job."  [employer]: None Premorbid Home Care/DME Agencies: None Transportation available at discharge: yes Resource referrals recommended: Neuropsychology, Support group (specify)  Discharge Planning Living Arrangements: Spouse/significant other, Children Support Systems: Spouse/significant other, Children, Other relatives, Friends/neighbors Type of Residence: Private residence [employer] Resources: Multimedia programmer (specify)([employer]) Financial Resources: Employment Financial Screen Referred: No Living Expenses: Own Money Management: Patient Does the patient have any problems obtaining your medications?: No Home Management: pt/ family share responsibilities Patient/Family Preliminary Plans: Pt to return home with wife and daughter.  24/7 in place. 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  He is making good gains, however, speech is very affected. Daughter at bedside and very involved and knowledgeable about the medical issues as she is a Therapist, sports here at [employer] in [place].  No emotional distress for pt noted, however, will monitor.  SW to follow for d/c planning and support needs.  Gilda Abboud 04/18/2018, 4:41 PM

## 2018-04-18 NOTE — Progress Notes (Signed)
Venous Duplex result noted and Reesa Chew NP made aware

## 2018-04-18 NOTE — Progress Notes (Signed)
Discussed results of dopplers with Dr.Dalsanto, Dr. Arneta Cliche as well as daughter [name].   Dr. Christella Noa gave clearance to start anticoagulation. Discussed DOACs with pharmacy--Eliquis has least concerns for ICH. Will start with loading dose today.

## 2018-04-18 NOTE — Discharge Instructions (Addendum)
Inpatient Rehab Discharge Instructions  Hector Pugh Discharge date and time: 04/27/18   Activities/Precautions/ Functional Status: Activity: No lifting, driving, or strenuous exercise  till cleared by MD Diet: cardiac diet and diabetic diet--Monitor for pocketing Wound Care: keep wound clean and dry   Functional status:  ___ No restrictions     ___ Walk up steps independently _X__ 24/7 supervision/assistance   ___ Walk up steps with assistance ___ Intermittent supervision/assistance  ___ Bathe/dress independently ___ Walk with walker     _X__ Bathe/dress with supervision ___ Walk Independently    ___ Shower independently ___ Walk with assistance    ___ Shower with assistance _X__ No alcohol     ___ Return to work/school ________   Special Instructions:   COMMUNITY REFERRALS UPON DISCHARGE:    Outpatient: PT     OT    ST                   Agency:  [facility]       Phone:  [phone]              Appointment Date/Time:  04/30/18 @ 8:45 am - OT (please arrive 15 mins prior)                                                                 05/02/18 @ 8:00 am - PT                                                                 05/22/18 @ 11:00 - ST and PT    My questions have been answered and I understand these instructions. I will adhere to these goals and the provided educational materials after my discharge from the hospital.  Patient/Caregiver Signature _______________________________ Date __________  Clinician Signature _______________________________________ Date __________  Please bring this form and your medication list with you to all your follow-up doctor's appointments.      Information on my medicine - ELIQUIS (apixaban)  This medication education was reviewed with me or my healthcare representative as part of my discharge preparation.   Why was Eliquis prescribed for you? Eliquis was prescribed to treat blood clots that may have been found in the  veins of your legs (deep vein thrombosis) or in your lungs (pulmonary embolism) and to reduce the risk of them occurring again.  What do You need to know about Eliquis ? The starting dose is 10 mg (two 5 mg tablets) taken TWICE daily for the FIRST SEVEN (7) DAYS, then on 04/25/2018  the dose is reduced to ONE 5 mg tablet taken TWICE daily.  Eliquis may be taken with or without food.   Try to take the dose about the same time in the morning and in the evening. If you have difficulty swallowing the tablet whole please discuss with your pharmacist how to take the medication safely.  Take Eliquis exactly as prescribed and DO NOT stop taking Eliquis without talking to the doctor who prescribed the medication.  Stopping may increase your risk of  developing a new blood clot.  Refill your prescription before you run out.  After discharge, you should have regular check-up appointments with your healthcare provider that is prescribing your Eliquis.    What do you do if you miss a dose? If a dose of ELIQUIS is not taken at the scheduled time, take it as soon as possible on the same day and twice-daily administration should be resumed. The dose should not be doubled to make up for a missed dose.  Important Safety Information A possible side effect of Eliquis is bleeding. You should call your healthcare provider right away if you experience any of the following: ? Bleeding from an injury or your nose that does not stop. ? Unusual colored urine (red or dark brown) or unusual colored stools (red or black). ? Unusual bruising for unknown reasons. ? A serious fall or if you hit your head (even if there is no bleeding).  Some medicines may interact with Eliquis and might increase your risk of bleeding or clotting while on Eliquis. To help avoid this, consult your healthcare provider or pharmacist prior to using any new prescription or non-prescription medications, including herbals, vitamins,  non-steroidal anti-inflammatory drugs (NSAIDs) and supplements.  This website has more information on Eliquis (apixaban): http://www.eliquis.com/eliquis/home

## 2018-04-18 NOTE — Progress Notes (Signed)
Physical Therapy Session Note  Patient Details  Name: Hector Pugh MRN: [id number] Date of Birth: 03-22-1961  Today's Date: 04/18/2018 PT Individual Time: 1450-1600   45min   Short Term Goals: Week 1:  PT Short Term Goal 1 (Week 1): Pt will ambulate without AD x 150 ft with min assist PT Short Term Goal 2 (Week 1): Pt will ascend/descend 12 steps with B handrails and min assist PT Short Term Goal 3 (Week 1): Pt will attend to R side during 75% of functional mobility tasks  Skilled Therapeutic Interventions/Progress Updates:   Pt received supine in bed and agreeable to PT. Pt on bed rest due to DVT in RLE. Per PA, pt cleared for UE therex. PT treatment focused on UE strengthening, and visual scanning activities to compensate for R inattention/field cut.   UE therex:  shoulder press x 15 with 3# bar weight.  Chest press x 15 with 3# bar weight.  tricep extension x 15 with level 2 tband  Bicep curl x 15 BUE with level 2 tband.  Horizontal abduction x 12 with level 2 tband.  Ball taps with 3# bar weight. Difficulty noted with visual tracking to the R. 2bouts x 1 min   Color identification of clothes pins on the R side to place on pin rack 1 clothes pin, 2 clothes pins and 3 clothes pins . Pt able to follow 1 and 2 step but difficulty remembering 3 step color identification.  Peg board puzzle with 1 easy and moderate difficulty puzzles with min-mod cues for improved R visual scanning and questioning cues for error detection.    Pt left supine in bed with call bell in        Therapy Documentation Precautions:  Precautions Precautions: Fall Precaution Comments: inattentive R side, word finding deficits  Restrictions Weight Bearing Restrictions: No Vital Signs: Therapy Vitals Temp: 99.4 F (37.4 C) Temp Source: Oral Pulse Rate: 84 Resp: 16 BP: 125/70 Patient Position (if appropriate): Lying Oxygen Therapy SpO2: 100 % O2 Device: Room Air Pain: Pain Assessment Pain  Scale: Faces Faces Pain Scale: Hurts even more Pain Type: Surgical pain Pain Location: Head Pain Descriptors / Indicators: Aching Pain Frequency: Intermittent Pain Intervention(s): Medication (See eMAR)  See Function Navigator for Current Functional Status.   Therapy/Group: Individual Therapy  Lorie Phenix 04/18/2018, 3:53 PM

## 2018-04-18 NOTE — Progress Notes (Signed)
Occupational Therapy Session Note  Patient Details  Name: Hector Pugh MRN: [id number] Date of Birth: 1961-04-29  Today's Date: 04/18/2018 OT Individual Time: 1010-1105 OT Individual Time Calculation (min): 55 min    Short Term Goals: Week 1:  OT Short Term Goal 1 (Week 1): Pt will complete bathing with min assist at sit > stand level OT Short Term Goal 2 (Week 1): Pt will complete LB dressing with min assist at sit > stand level OT Short Term Goal 3 (Week 1): Pt will complete toilet transfer with min assist with LRAD OT Short Term Goal 4 (Week 1): Pt will complete 2 grooming tasks in standing with min cues  Skilled Therapeutic Interventions/Progress Updates:    Treatment session with focus on ADL retraining with functional transfers, dynamic standing balance, and Rt attention.  Pt received upright in w/c expressing desire to shower this session.  Pt ambulated to room shower with RW with min assist and mod cues due to pt bumping in to obstacles on Rt.  Bathing completed at sit > stand level with setup for items with focus on visually scanning to Rt to obtain items.  Min cues for initiation with bathing and for thoroughness.  Pt completed dressing tasks at sit > stand level with 1 LOB in standing when pulling up pants but able to correct without assistance.  Engaged in 9 hole peg test with pt completing in Rt: 1:07 and Lt: 43.  Discussed results and observations with decreased fine motor control and Rt inattention when picking up pegs.  Therapy Documentation Precautions:  Precautions Precautions: Fall Precaution Comments: inattentive R side, word finding deficits  Restrictions Weight Bearing Restrictions: No Pain: Pain Assessment Pain Scale: 0-10 Pain Score: 0-No pain  See Function Navigator for Current Functional Status.   Therapy/Group: Individual Therapy  Simonne Come 04/18/2018, 12:46 PM

## 2018-04-18 NOTE — Progress Notes (Signed)
ANTICOAGULATION CONSULT NOTE - Initial Consult  Pharmacy Consult for Eliquis Indication: VTE prophylaxis  No Known Allergies  Patient Measurements: Height: 5\' 6"  (167.6 cm) Weight: 171 lb 11.8 oz (77.9 kg) IBW/kg (Calculated) : 63.8  Vital Signs: Temp: 99.1 F (37.3 C) (05/16 0535) Temp Source: Oral (05/16 0535) BP: 126/77 (05/16 0535) Pulse Rate: 78 (05/16 0535)  Labs: Recent Labs    04/16/18 0558  HGB 9.5*  HCT 28.5*  PLT 234  CREATININE 0.97    Estimated Creatinine Clearance: 81.5 mL/min (by C-G formula based on SCr of 0.97 mg/dL).    Assessment: 82 YOM admitted on 5/3 with large temporal lobe hemorrhagic stroke Found to have new RLE DVT on doppler, team discussed with Dr. Christella Noa (Neurosurgery) cleared to start anticoagulation Hgb 9.5, pltc 234K on 5/14, est. Crcl ~ 80  Goal of Therapy:  Monitor platelets by anticoagulation protocol: Yes   Plan:  Eliquis 10mg  PO BID x 7 days through 5/22 Then Eliquis 5mg  BID from 5/23  Maryanna Shape, PharmD, BCPS  Clinical Pharmacist  Pager: [phone]   04/18/2018,1:45 PM

## 2018-04-18 NOTE — Progress Notes (Signed)
[facility]     PROGRESS NOTE  Subjective/Complaints:  Pt seen lying in bed this AM. He states he slept well overnight, confirmed by sleep chart.  No reported issues overnight.   ROS: Limited due to aphasia  Objective: Vital Signs: Blood pressure 126/77, pulse 78, temperature 99.1 F (37.3 C), temperature source Oral, resp. rate 18, height 5\' 6"  (1.676 m), weight 77.9 kg (171 lb 11.8 oz), SpO2 99 %. Dg Chest 2 View  Result Date: 04/16/2018 CLINICAL DATA:  Fever EXAM: CHEST - 2 VIEW COMPARISON:  04/11/2018 FINDINGS: Left arm PICC has been removed. Hypoventilation with slight bibasilar atelectasis. Negative for heart failure or pneumonia or effusion. IMPRESSION: Hypoventilation with mild bibasilar atelectasis. Electronically Signed   By: Franchot Gallo M.D.   On: 04/16/2018 16:03   Recent Labs    04/16/18 0558  WBC 8.6  HGB 9.5*  HCT 28.5*  PLT 234   Recent Labs    04/16/18 0558  NA 137  K 3.5  CL 104  GLUCOSE 182*  BUN 14  CREATININE 0.97  CALCIUM 8.2*   CBG (last 3)  Recent Labs    04/17/18 1634 04/17/18 2134 04/18/18 0649  GLUCAP 220* 219* 150*    Wt Readings from Last 3 Encounters:  04/18/18 77.9 kg (171 lb 11.8 oz)  04/05/18 72.5 kg (159 lb 13.3 oz)  04/04/18 71.7 kg (158 lb)    Physical Exam:  BP 126/77 (BP Location: Left Arm)   Pulse 78   Temp 99.1 F (37.3 C) (Oral)   Resp 18   Ht 5\' 6"  (1.676 m)   Wt 77.9 kg (171 lb 11.8 oz)   SpO2 99%   BMI 27.72 kg/m  Constitutional: He appears well-developed and well-nourished.  HENT: Left crani incision intact with sutures in place.   Eyes: EOMI. No discharge.  Cardiovascular: RRR. No JVD. Respiratory: Effort normal and breath sounds normal.  GI: Bowel sounds are normal.  Musculoskeletal: 1+ pitting edema RLE with resolving ecchymosis on shin. Min edema right hand/forearm.   Neurological: He is alert and oriented x1, confused, unchanged.  Right inattention with left gaze  preference and inability to move eyes to right lateral field, unchanged.  Right facial weakness with minimal dysarthria. Fluent aphasia with apraxia.  Motor: LUE/LLE appear to be 5/5 proximal to distal RUE: appears to be 4+-/5 proximal to distal (unchanged) RLE: appears to be 4+/5 proximal to distal (unchanged) Skin: See above  Psychiatric: Limited due to aphasia   Assessment/Plan: 1. Functional deficits secondary to left temporal hemorrhage which require 3+ hours per day of interdisciplinary therapy in a comprehensive inpatient rehab setting. Physiatrist is providing close team supervision and 24 hour management of active medical problems listed below. Physiatrist and rehab team continue to assess barriers to discharge/monitor patient progress toward functional and medical goals.  Function:  Bathing Bathing position Bathing activity did not occur: Refused Position: Production manager parts bathed by patient: Right arm, Left arm, Chest, Front perineal area, Abdomen, Right upper leg, Buttocks, Left upper leg Body parts bathed by helper: Back  Bathing assist Assist Level: Touching or steadying assistance(Pt > 75%), Supervision or verbal cues      Upper Body Dressing/Undressing Upper body dressing   What is the patient wearing?: Pull over shirt/dress     Pull over shirt/dress - Perfomed by patient: Thread/unthread right sleeve, Thread/unthread left sleeve, Put head through opening Pull over shirt/dress - Perfomed by helper: Pull shirt over trunk  Upper body assist Assist Level: Touching or steadying assistance(Pt > 75%)      Lower Body Dressing/Undressing Lower body dressing   What is the patient wearing?: Underwear, Pants, Non-skid slipper socks, Ted Hose Underwear - Performed by patient: Thread/unthread right underwear leg, Thread/unthread left underwear leg, Pull underwear up/down Underwear - Performed by helper: Thread/unthread right underwear leg,  Thread/unthread left underwear leg Pants- Performed by patient: Thread/unthread right pants leg, Thread/unthread left pants leg, Pull pants up/down Pants- Performed by helper: Thread/unthread right pants leg, Thread/unthread left pants leg Non-skid slipper socks- Performed by patient: Don/doff right sock, Don/doff left sock Non-skid slipper socks- Performed by helper: Don/doff right sock, Don/doff left sock               TED Hose - Performed by helper: Don/doff right TED hose, Don/doff left TED hose  Lower body assist Assist for lower body dressing: Touching or steadying assistance (Pt > 75%), Set up, Supervision or verbal cues   Set up : Don/doff TED stockings  Toileting Toileting   Toileting steps completed by patient: Adjust clothing prior to toileting, Performs perineal hygiene Toileting steps completed by helper: Adjust clothing after toileting    Toileting assist Assist level: Touching or steadying assistance (Pt.75%)   Transfers Chair/bed transfer   Chair/bed transfer method: Stand pivot Chair/bed transfer assist level: Touching or steadying assistance (Pt > 75%) Chair/bed transfer assistive device: Armrests     Locomotion Ambulation     Max distance: 159ft  Assist level: Moderate assist (Pt 50 - 74%)   Wheelchair          Cognition Comprehension Comprehension assist level: Understands basic 50 - 74% of the time/ requires cueing 25 - 49% of the time  Expression Expression assist level: Expresses basic 50 - 74% of the time/requires cueing 25 - 49% of the time. Needs to repeat parts of sentences.  Social Interaction Social Interaction assist level: Interacts appropriately 75 - 89% of the time - Needs redirection for appropriate language or to initiate interaction.  Problem Solving Problem solving assist level: Solves basic 50 - 74% of the time/requires cueing 25 - 49% of the time  Memory Memory assist level: Recognizes or recalls 25 - 49% of the time/requires cueing  50 - 75% of the time    Medical Problem List and Plan: 1.  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  DVT Prophylaxis/Anticoagulation: Mechanical: Sequential compression devices, below knee Bilateral lower extremities 3. Pain Management: Monitor HA as activity increases. Continue Fioricet.   Appears to be controlled at present 4. Mood: LCSW to follow for evaluation when appropriate 5. Neuropsych: This patient is not capable of making decisions on his own behalf. 6. Skin/Wound Care: Monitor incision for healing. Maintain adequate hydration and nutritional status.  7. Fluids/Electrolytes/Nutrition: Monitor I/O.   Advanced to regular texture diet 8. HTN: Monitor BP bid with SBP goal < 140. Norvasc added on 5/9   Slightly labile on 5/16  Monitor with increased mobility 9. T2DM s/p Whipple for pancreatic cancer: Hgb A1C- 6.8.  Labile on 5/16, will consider medications if persistently elevated  Monitor with increased mobility 10. Seizure prophylaxis: Continue Keppra bid 11. Cerebral edema: Hypertonic saline and Cleviprex weaned off 5/12--Sodium has normalized.   12.  Fevers:  Aspiration PNA treated--antibiotics d/ced on 5/7.    UTI +/- atelectasis  Blood cultures likely contaminent  Urine culture E. Faecalis  CXR  reviewed, showing atelectasis. Encourage IS  Discussed with pharmacy, Amoxacillin 500 TID 5/16-5/22 13. A fib with RVR: Heart rate controlled with increase in Metoprolol.  Amiodarone has been weaned to oral route--monitor HR bid and with activity.   Controlled on 5/16 14. Headaches:  Currently using Fioricet. Will continue to monitor 15. ABLA: Monitor for signs of bleeding.   Hb 9.5 on 5/14  Cont to monitor  16. Hypoalbuminemia   Supplement initiated on 5/14  LOS (Days) 3 A FACE TO FACE EVALUATION WAS PERFORMED  Ankit Aldine Grainger 04/18/2018 9:39 AM

## 2018-04-19 ENCOUNTER — Inpatient Hospital Stay (HOSPITAL_COMMUNITY): Payer: Managed Care, Other (non HMO) | Admitting: Speech Pathology

## 2018-04-19 ENCOUNTER — Other Ambulatory Visit: Payer: Self-pay

## 2018-04-19 ENCOUNTER — Inpatient Hospital Stay (HOSPITAL_COMMUNITY): Payer: Managed Care, Other (non HMO)

## 2018-04-19 ENCOUNTER — Inpatient Hospital Stay (HOSPITAL_COMMUNITY): Payer: Managed Care, Other (non HMO) | Admitting: Physical Therapy

## 2018-04-19 ENCOUNTER — Inpatient Hospital Stay (HOSPITAL_COMMUNITY): Payer: Managed Care, Other (non HMO) | Admitting: Occupational Therapy

## 2018-04-19 DIAGNOSIS — I824Z1 Acute embolism and thrombosis of unspecified deep veins of right distal lower extremity: Secondary | ICD-10-CM

## 2018-04-19 LAB — GLUCOSE, CAPILLARY
GLUCOSE-CAPILLARY: 155 mg/dL — AB (ref 65–99)
Glucose-Capillary: 151 mg/dL — ABNORMAL HIGH (ref 65–99)
Glucose-Capillary: 228 mg/dL — ABNORMAL HIGH (ref 65–99)
Glucose-Capillary: 253 mg/dL — ABNORMAL HIGH (ref 65–99)

## 2018-04-19 LAB — CULTURE, BLOOD (ROUTINE X 2): SPECIAL REQUESTS: ADEQUATE

## 2018-04-19 MED ORDER — GLIPIZIDE ER 5 MG PO TB24
5.0000 mg | ORAL_TABLET | Freq: Every day | ORAL | Status: DC
  Administered 2018-04-19 – 2018-04-27 (×9): 5 mg via ORAL
  Filled 2018-04-19 (×10): qty 1

## 2018-04-19 NOTE — Progress Notes (Signed)
Speech Language Pathology Daily Session Note  Patient Details  Name: Hector Pugh MRN: [id number] Date of Birth: 1961/02/07  Today's Date: 04/19/2018 SLP Individual Time: 1405-1500 SLP Individual Time Calculation (min): 55 min  Short Term Goals: Week 1: SLP Short Term Goal 1 (Week 1): Pt will consume regular textures and thin liquids with mod I use of swallowing precautions to clear residuals from the oral cavity.   SLP Short Term Goal 2 (Week 1): Pt will follow 1 step commands during functional tasks for 75% accuracy with mod assist multimodal cues.   SLP Short Term Goal 3 (Week 1): Pt will recognize and correct verbal errors at the word level with mod assist multimodal cues.   SLP Short Term Goal 4 (Week 1): Pt will locate items to the right of midline during functional tasks in 75% of opportunities with mod assist multimodal cues.    Skilled Therapeutic Interventions: Pt was seen for skilled ST targeting cognitive-linguistic goals.  Pt requested to use the bathroom upon therapist's arrival and needed mod-max assist multimodal cues for safety due to impulsivity with mobility and decreased awareness of obstacles on his right side.  Pt was able to match phrase to object from a field of 2 for 100% accuracy with min assist verbal cues to recognize and correct errors.  Pt could then name objects when given a written cue with mod assist verbal cues to correct phoneme substitution errors.  Subjectively pt's speech, though tangential, appears more goal directed and appropriate today in comparison to previous therapy sessions.  Pt was returned to room and left in wheelchair with call bell within reach and quick release belt donned.  Continue per current plan of care.    Function:  Eating Eating                Cognition Comprehension Comprehension assist level: Understands basic 50 - 74% of the time/ requires cueing 25 - 49% of the time  Expression   Expression assist level: Expresses basic  50 - 74% of the time/requires cueing 25 - 49% of the time. Needs to repeat parts of sentences.  Social Interaction Social Interaction assist level: Interacts appropriately 75 - 89% of the time - Needs redirection for appropriate language or to initiate interaction.  Problem Solving Problem solving assist level: Solves basic 50 - 74% of the time/requires cueing 25 - 49% of the time  Memory Memory assist level: Recognizes or recalls 25 - 49% of the time/requires cueing 50 - 75% of the time    Pain Pain Assessment Pain Scale: 0-10 Pain Score: 0-No pain  Therapy/Group: Individual Therapy  Eliyahu Bille, Selinda Orion 04/19/2018, 3:03 PM

## 2018-04-19 NOTE — Progress Notes (Signed)
[place] PHYSICAL MEDICINE & REHABILITATION     PROGRESS NOTE  Subjective/Complaints:  Pt seen lying in bed this AM. He states he slept well overnight, confirmed by sleep chart.  No reported issues overnight.   ROS: Limited due to aphasia  Objective: Vital Signs: Blood pressure 126/75, pulse 81, temperature 99.3 F (37.4 C), temperature source Oral, resp. rate 18, height 5\' 6"  (1.676 m), weight 70.1 kg (154 lb 8.7 oz), SpO2 100 %. No results found. No results for input(s): WBC, HGB, HCT, PLT in the last 72 hours. No results for input(s): NA, K, CL, GLUCOSE, BUN, CREATININE, CALCIUM in the last 72 hours.  Invalid input(s): CO CBG (last 3)  Recent Labs    04/18/18 1651 04/18/18 2115 04/19/18 0634  GLUCAP 207* 162* 151*    Wt Readings from Last 3 Encounters:  04/19/18 70.1 kg (154 lb 8.7 oz)  04/05/18 72.5 kg (159 lb 13.3 oz)  04/04/18 71.7 kg (158 lb)    Physical Exam:  BP 126/75 (BP Location: Left Arm)   Pulse 81   Temp 99.3 F (37.4 C) (Oral)   Resp 18   Ht 5\' 6"  (1.676 m)   Wt 70.1 kg (154 lb 8.7 oz)   SpO2 100%   BMI 24.94 kg/m  Constitutional: No distress . Vital signs reviewed. HEENT: EOMI, oral membranes moist. Left crani incision intact Neck: supple Cardiovascular: RRR without murmur. No JVD    Respiratory: CTA Bilaterally without wheezes or rales. Normal effort    GI: BS +, non-tender, non-distended  Musculoskeletal: 1+ pitting edema RLE with resolving ecchymosis on shin. Min edema right hand/forearm.   Neurological: He is alert and oriented x1, confused, follows commands.  Right inattention with left gaze preference and inability to move eyes to right lateral field, unchanged.  Right facial weakness with minimal dysarthria. Fluent aphasia with apraxia---improving Motor: LUE/LLE appear to be 5/5 proximal to distal RUE: appears to be 4+-/5 proximal to distal (stable) RLE: appears to be 4+/5 proximal to distal (stable) Skin: See above  Psychiatric: a  little anxious  Assessment/Plan: 1. Functional deficits secondary to left temporal hemorrhage which require 3+ hours per day of interdisciplinary therapy in a comprehensive inpatient rehab setting. Physiatrist is providing close team supervision and 24 hour management of active medical problems listed below. Physiatrist and rehab team continue to assess barriers to discharge/monitor patient progress toward functional and medical goals.  Function:  Bathing Bathing position Bathing activity did not occur: Refused Position: Production manager parts bathed by patient: Right arm, Left arm, Chest, Front perineal area, Abdomen, Right upper leg, Buttocks, Left upper leg Body parts bathed by helper: Back  Bathing assist Assist Level: Touching or steadying assistance(Pt > 75%), Supervision or verbal cues      Upper Body Dressing/Undressing Upper body dressing   What is the patient wearing?: Pull over shirt/dress     Pull over shirt/dress - Perfomed by patient: Thread/unthread right sleeve, Thread/unthread left sleeve, Put head through opening Pull over shirt/dress - Perfomed by helper: Pull shirt over trunk        Upper body assist Assist Level: Touching or steadying assistance(Pt > 75%)      Lower Body Dressing/Undressing Lower body dressing   What is the patient wearing?: Underwear, Pants, Non-skid slipper socks, Ted Hose Underwear - Performed by patient: Thread/unthread right underwear leg, Thread/unthread left underwear leg, Pull underwear up/down Underwear - Performed by helper: Thread/unthread right underwear leg, Thread/unthread left underwear leg Pants- Performed by  patient: Thread/unthread right pants leg, Thread/unthread left pants leg, Pull pants up/down Pants- Performed by helper: Thread/unthread right pants leg, Thread/unthread left pants leg Non-skid slipper socks- Performed by patient: Don/doff right sock, Don/doff left sock Non-skid slipper socks- Performed by  helper: Don/doff right sock, Don/doff left sock               TED Hose - Performed by helper: Don/doff right TED hose, Don/doff left TED hose  Lower body assist Assist for lower body dressing: Touching or steadying assistance (Pt > 75%), Set up, Supervision or verbal cues   Set up : Don/doff TED stockings  Toileting Toileting   Toileting steps completed by patient: Adjust clothing prior to toileting, Performs perineal hygiene(per Leann Nickles-Wright, NT) Toileting steps completed by helper: Adjust clothing after toileting    Toileting assist Assist level: Touching or steadying assistance (Pt.75%)   Transfers Chair/bed transfer   Chair/bed transfer method: Stand pivot Chair/bed transfer assist level: Touching or steadying assistance (Pt > 75%) Chair/bed transfer assistive device: Armrests     Locomotion Ambulation     Max distance: 11ft  Assist level: Moderate assist (Pt 50 - 74%)   Wheelchair          Cognition Comprehension Comprehension assist level: Understands basic 50 - 74% of the time/ requires cueing 25 - 49% of the time  Expression Expression assist level: Expresses basic 50 - 74% of the time/requires cueing 25 - 49% of the time. Needs to repeat parts of sentences.  Social Interaction Social Interaction assist level: Interacts appropriately 75 - 89% of the time - Needs redirection for appropriate language or to initiate interaction.  Problem Solving Problem solving assist level: Solves basic 50 - 74% of the time/requires cueing 25 - 49% of the time  Memory Memory assist level: Recognizes or recalls 25 - 49% of the time/requires cueing 50 - 75% of the time    Medical Problem List and Plan: 1.  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  DVT right gastroc and soleal veins: eliquis after review with NS  -mobilize today 3. Pain Management:  Monitor HA as activity increases. Continue Fioricet.   Appears to be controlled at present 4. Mood: LCSW to follow for evaluation when appropriate 5. Neuropsych: This patient is not capable of making decisions on his own behalf. 6. Skin/Wound Care: Monitor incision for healing. Maintain adequate hydration and nutritional status.  7. Fluids/Electrolytes/Nutrition: Monitor I/O.   Advanced to regular texture diet 8. HTN: Monitor BP bid with SBP goal < 140. Norvasc added on 5/9   Reasonable control today 5/17  Monitor with increased mobility 9. T2DM s/p Whipple for pancreatic cancer: Hgb A1C- 6.8.  Persistent elevation  -on glucotrol xl 5mg  at home. Resume today 10. Seizure prophylaxis: Continue Keppra bid 11. Cerebral edema: Hypertonic saline and Cleviprex weaned off 5/12--Sodium has normalized.   12.  Fevers:  Aspiration PNA treated--antibiotics d/ced on 5/7.    UTI +/- atelectasis  Blood cultures likely contaminent  Urine culture E. Faecalis  CXR reviewed, showing atelectasis. Encourage IS   Amoxicillin 500 TID 5/16-5/22 13. A fib with RVR: Heart rate controlled with increase in Metoprolol.  Amiodarone has been weaned to oral route--monitor HR bid and with activity.   Controlled on 5/16 14. Headaches:  Currently using Fioricet. Will continue to monitor 15. ABLA: Monitor for signs of bleeding.   Hb 9.5 on 5/14  Cont to monitor  16. Hypoalbuminemia   Supplement initiated on 5/14  LOS (Days) 4 A FACE TO FACE EVALUATION WAS PERFORMED  Meredith Staggers 04/19/2018 8:20 AM

## 2018-04-19 NOTE — Progress Notes (Signed)
Occupational Therapy Session Note  Patient Details  Name: Hector Pugh MRN: [id number] Date of Birth: 21-Aug-1961  Today's Date: 04/19/2018 OT Individual Time: 0830-0900 OT Individual Time Calculation (min): 30 min    Short Term Goals: Week 1:  OT Short Term Goal 1 (Week 1): Pt will complete bathing with min assist at sit > stand level OT Short Term Goal 2 (Week 1): Pt will complete LB dressing with min assist at sit > stand level OT Short Term Goal 3 (Week 1): Pt will complete toilet transfer with min assist with LRAD OT Short Term Goal 4 (Week 1): Pt will complete 2 grooming tasks in standing with min cues  Skilled Therapeutic Interventions/Progress Updates:    Pt received supine in bed with HOB raised eating breakfast. Pt required vc for pacing and size of bites during eating. Pt requested to leave bed and edu re bedrest orders and indication. Pt completed UB bathing and dressing at bed level with (S), however required tactile cues and visual cues to follow directions in thoroughness. Pt completed LB bathing and dressing with (S) at bed level as well. Toward end of session, this OT was alerted to bedrest orders being lifted. Pt transferred to EOB with (S). Pt completed sit to stand transfer with (S) and pivoted to w/c. Pt left in w/c with chair alarm set and all needs met, with daughter present.     Therapy Documentation Precautions:  Precautions Precautions: Fall Precaution Comments: inattentive R side, word finding deficits  Restrictions Weight Bearing Restrictions: No Pain: Pain Assessment Pain Scale: 0-10 Pain Score: 0-No pain  See Function Navigator for Current Functional Status.   Therapy/Group: Individual Therapy  Curtis Sites 04/19/2018, 12:20 PM

## 2018-04-19 NOTE — Progress Notes (Signed)
Physical Therapy Session Note  Patient Details  Name: Hector Pugh MRN: [id number] Date of Birth: 16-Jul-1961  Today's Date: 04/19/2018 PT Individual Time: 1500-1600 PT Individual Time Calculation (min): 60 min   Short Term Goals: Week 1:  PT Short Term Goal 1 (Week 1): Pt will ambulate without AD x 150 ft with min assist PT Short Term Goal 2 (Week 1): Pt will ascend/descend 12 steps with B handrails and min assist PT Short Term Goal 3 (Week 1): Pt will attend to R side during 75% of functional mobility tasks  Skilled Therapeutic Interventions/Progress Updates:    Pt received sitting in WC and agreeable to PT Gait training instructed by PT x 233f, 1514fx2, 1002fin assist from PT with min cues for awareness of the R side as well as posture and to widen BOS to improve stability.   Nustep reciprocal movement training x 8 minutes. Min cues for improved attention to the R UE to improve grasp on handle throughout.   Dynavision. Program A 4 rings, 1 min, x 5. 10, 12, 23, 21, and 19. Max cues for increased visual scanning to the R. PT attempted to perform A, 4 ring, with t scope. Pt unable to visual words or numbers due to R field cut.    dynamic balance/ gait training to weave through 8 cones x 4 with supervision-min assist from PT. Pt only required min cues to avoid cones of the R. Gait over unlevel surface x 10 ft with min assist from PT and increased step height on the R.   Patient returned to room and left sitting in WC [facility] call bell in reach and all needs met.        Therapy Documentation Precautions:  Precautions Precautions: Fall Precaution Comments: inattentive R side, word finding deficits  Restrictions Weight Bearing Restrictions: No Vital Signs: Therapy Vitals Temp: 98.2 F (36.8 C) Temp Source: Oral Pulse Rate: 75 BP: 112/73 Patient Position (if appropriate): Sitting Oxygen Therapy SpO2: 100 % O2 Device: Room Air Pain: Pain Assessment Pain Scale: 0-10 Pain  Score: 0-No pain   See Function Navigator for Current Functional Status.   Therapy/Group: Individual Therapy  AusLorie Phenix17/2019, 5:44 PM

## 2018-04-19 NOTE — Progress Notes (Addendum)
Occupational Therapy Session Note  Patient Details  Name: Hector Pugh MRN: [id number] Date of Birth: 03/31/1961  Today's Date: 04/19/2018 OT Group Time:  -  [phone] (60 min) and 11-1207 (8 minutes)    Skilled Therapeutic Interventions/Progress Updates: first treatment:  Patient participated in standing tolerance (dynamic activities), attention to task, orientation and fine motor skills , right environmental attention, as well as right attention and integration into right hand and bilateral hand skills  in room and kitchen.   He stated he cooks and completes home keeping skills at home.    He was able to ambulate in room and kitchen with close supervision to [place] for when turning to his right environment.    He required tactile or verbal cues to integrate his right hand into actitivies about 25% of the time tasks.   He gently bumped into doorways about 10% of the time that he ambulated or while completing home skills in his room or in the [place].  This patient exhibitied short term memory and orientation and focus to situation deficitis this session.   Otherwise, patient was very pleasant this session and often stated, "Thank you..... ".   It is good to see you."   And he often made humorous statements.  2nd session, his chair alarm was going off (this clinician heard it from the hall).   He had gotten out of his wheelchair and walked to the bathroom alone.   Though when asked if he pressed his call bell for assistance, He stated he pressed his call bell and really needed to go quickly.   This patient did exhibit short term memory deficits during earlier session.   Patient demonstrated close S to walk to toilet for toilet transfer and he completed toileting and clothing management and cleansing with distant S after having a BM.   RN and nurse tech were made aware that patient took himself to the restroom and that his chair alarm was buzzing (his room was the last room on the left from the nurses's  station)       Therapy Documentation Precautions:  Precautions Precautions: Fall Precaution Comments: inattentive R side, word finding deficits  Restrictions Weight Bearing Restrictions: No    Pain: Pain Assessment Pain Scale: 0-10 Pain Score: 0-No pain    Vision and Perception patient with right inattention and possible field cut    See Function Navigator for Current Functional Status.   Therapy/Group: Individual Therapy  Alfredia Ferguson [facility] 04/19/2018, 12:04 PM

## 2018-04-20 ENCOUNTER — Inpatient Hospital Stay (HOSPITAL_COMMUNITY): Payer: Managed Care, Other (non HMO) | Admitting: Speech Pathology

## 2018-04-20 ENCOUNTER — Inpatient Hospital Stay (HOSPITAL_COMMUNITY): Payer: Managed Care, Other (non HMO) | Admitting: Physical Therapy

## 2018-04-20 ENCOUNTER — Inpatient Hospital Stay (HOSPITAL_COMMUNITY): Payer: Managed Care, Other (non HMO) | Admitting: Occupational Therapy

## 2018-04-20 LAB — GLUCOSE, CAPILLARY
GLUCOSE-CAPILLARY: 183 mg/dL — AB (ref 65–99)
GLUCOSE-CAPILLARY: 155 mg/dL — AB (ref 65–99)
Glucose-Capillary: 129 mg/dL — ABNORMAL HIGH (ref 65–99)
Glucose-Capillary: 209 mg/dL — ABNORMAL HIGH (ref 65–99)

## 2018-04-20 MED ORDER — APIXABAN 5 MG PO TABS
5.0000 mg | ORAL_TABLET | Freq: Two times a day (BID) | ORAL | Status: DC
  Administered 2018-04-25 – 2018-04-27 (×5): 5 mg via ORAL
  Filled 2018-04-20 (×5): qty 1

## 2018-04-20 MED ORDER — APIXABAN 5 MG PO TABS
10.0000 mg | ORAL_TABLET | Freq: Two times a day (BID) | ORAL | Status: AC
  Administered 2018-04-20 – 2018-04-24 (×9): 10 mg via ORAL
  Filled 2018-04-20 (×9): qty 2

## 2018-04-20 NOTE — Progress Notes (Signed)
Speech Language Pathology Daily Session Note  Patient Details  Name: Hector Pugh MRN: [id number] Date of Birth: August 16, 1961  Today's Date: 04/20/2018 SLP Individual Time: 1405-1500 SLP Individual Time Calculation (min): 55 min  Short Term Goals: Week 1: SLP Short Term Goal 1 (Week 1): Pt will consume regular textures and thin liquids with mod I use of swallowing precautions to clear residuals from the oral cavity.   SLP Short Term Goal 2 (Week 1): Pt will follow 1 step commands during functional tasks for 75% accuracy with mod assist multimodal cues.   SLP Short Term Goal 3 (Week 1): Pt will recognize and correct verbal errors at the word level with mod assist multimodal cues.   SLP Short Term Goal 4 (Week 1): Pt will locate items to the right of midline during functional tasks in 75% of opportunities with mod assist multimodal cues.    Skilled Therapeutic Interventions:  Pt was seen for skilled ST targeting communication goals.  Pt had already eaten lunch upon therapist's arrival and declined a functional snack of regular textures that his family had brought from home.  Will follow up for diet toleration at next available appointment.  Pt was able to match word to written descriptive phrase from a field of two for 100% accuracy with min assist verbal cues for accurate decoding.  Pt had fewer verbal errors with oral reading of cards in comparison to yesterday's therapy session and needed min assist cues to name objects when given a written model for 80% accuracy.  Accuracy improved to 90% with mod assist verbal cues to recognize phoneme substitution errors.  Pt demonstrated improved letter formation and legibility when writing biographical information in comparison to initial attempts earlier in admission.  Pt needed min-mod assist to correct letter substitution errors when writing the names of familiar objects.   Pt was handed off to PT.  Continue per current plan of care.     Function  Eating Eating                 Cognition Comprehension Comprehension assist level: Understands basic 50 - 74% of the time/ requires cueing 25 - 49% of the time  Expression   Expression assist level: Expresses basic 50 - 74% of the time/requires cueing 25 - 49% of the time. Needs to repeat parts of sentences.  Social Interaction Social Interaction assist level: Interacts appropriately 75 - 89% of the time - Needs redirection for appropriate language or to initiate interaction.  Problem Solving Problem solving assist level: Solves basic 50 - 74% of the time/requires cueing 25 - 49% of the time  Memory Memory assist level: Recognizes or recalls 50 - 74% of the time/requires cueing 25 - 49% of the time    Pain Pain Assessment Pain Scale: 0-10 Pain Score: 0-No pain  Therapy/Group: Individual Therapy  Elandra Powell, Selinda Orion 04/20/2018, 3:06 PM

## 2018-04-20 NOTE — Progress Notes (Signed)
Physical Therapy Session Note  Patient Details  Name: Hector Pugh MRN: [id number] Date of Birth: 07-11-1961  Today's Date: 04/20/2018 PT Individual Time: 884-166, 0630-1601, and 1500-1515 PT Individual Time Calculation (min): 30 min, 30 min, and 15 min (total 75 min)  Short Term Goals: Week 1:  PT Short Term Goal 1 (Week 1): Pt will ambulate without AD x 150 ft with min assist PT Short Term Goal 2 (Week 1): Pt will ascend/descend 12 steps with B handrails and min assist PT Short Term Goal 3 (Week 1): Pt will attend to R side during 75% of functional mobility tasks  Skilled Therapeutic Interventions/Progress Updates: Tx1: Pt presented in w/c agreeable to therapy. Pt donned pants in sitting and performed sit to stand with RW to complete clothing management with stand by assist. Nsg arrived to administer meds. Pt transported to rehab gym and performed coordination activities with 4in step including taps to target and alternating toe taps. Pt  Initially required minA fading to min guard. Pt performed dynamic reaching no AD with no LOB. Pt then returned to room and remained in w/c with call bell within reach and needs met.   Tx2: Pt presented in w/c agreeable to therapy. Session focused on dynamic balance and gait. Pt transported to reabh gym for time management and participated in obstacle course including weaving through cones, stepping over threshold and performing step up on/over 4in step. Pt initially performed with RW then without AD. Pt required min verbal cues for negotiation of RW due to R inattention. Pt participated in standing balance on compliant surface tossing horseshoes with pt requiring minA for sequencing as pt would step off [place] to place horseshoe. Pt noted no LOB with activity. Pt handed off to OT at end of session.   Tx3: Pt presented in w/c at dayroom hand off from SLP. Pt performed sit to stand no AD and ambulated to NuStep. Pt participated in NuStep L3 x 10 min for  reciprocal movement and endurance. Pt required intermittent cues for maintaining B knees in neutral position. Pt maintained avg 50 steps per min throughout session. Pt transferred back to w/c in same manner as prior and then handed off to OT for next session.       Therapy Documentation Precautions:  Precautions Precautions: Fall Precaution Comments: inattentive R side, word finding deficits  Restrictions Weight Bearing Restrictions: No  See Function Navigator for Current Functional Status.   Therapy/Group: Individual Therapy  Yehoshua Vitelli  Brookelynn Hamor, PTA  04/20/2018, 12:52 PM

## 2018-04-20 NOTE — Progress Notes (Signed)
Occupational Therapy Session Note  Patient Details  Name: Hector Pugh MRN: [id number] Date of Birth: Mar 21, 1961  Today's Date: 04/20/2018 OT Individual Time: 1115-1200 1515-1530 OT Individual Time Calculation (min): 45 min ; 15 min   Short Term Goals: Week 1:  OT Short Term Goal 1 (Week 1): Pt will complete bathing with min assist at sit > stand level OT Short Term Goal 2 (Week 1): Pt will complete LB dressing with min assist at sit > stand level OT Short Term Goal 3 (Week 1): Pt will complete toilet transfer with min assist with LRAD OT Short Term Goal 4 (Week 1): Pt will complete 2 grooming tasks in standing with min cues  Skilled Therapeutic Interventions/Progress Updates:    Treatment session 1 focused on ADLs/self care training, transfer training, and pt education. Therapist received pt from PT and pt reported not need to completed bathing and dressing this AM. Pt agreeable to showering and dressing task in room. Pt completed showering task using grab bars and shower chair in sit<>stand level with CGA for steadiness and safety d/t poor attention andjudgement. Therapist provided v/c for hand/foot placement throughout tasks. Pt completed UB/LB dressing task in sit<>tand level. Thearpist provided A with donning TEDs d/t swelling in B LE. Thearpist instructed pt on safety awareness during functional mobility in room and encouraged pt to utilize furniture for steadiness while walking. Pt repositioned in w/c with chair alarm and QRB applied with call bell within reach left to rest for lunch.  Treatment session 2 focused on ther activty for functional and safe transfers in environment on different surfaces. Pt received in therapy gym by PT. Pt performed functional mobility without device around therapy room with CGA  D/t unsteadiness with cruising technique when nearfurniture to practice transfers in varies or chairs around room. Pt completed 18 transfers throughout room with min v/c for  sequencing. Pt requested to use the toilet and was transported back to room. Pt completed toileting with CGA and v/c for hand/foot placement and safety. Pt repositioned self in w/c with chair alarm and QRB applied with call bell within reach left to rest with needs met.   Therapy Documentation Precautions:  Precautions Precautions: Fall Precaution Comments: inattentive R side, word finding deficits  Restrictions Weight Bearing Restrictions: No   Pain: Pain Assessment Pain Scale: 0-10 Pain Score: 0-No pain  See Function Navigator for Current Functional Status.   Therapy/Group: Individual Therapy  Delon Sacramento 04/20/2018, 3:33 PM

## 2018-04-20 NOTE — Progress Notes (Signed)
[place] PHYSICAL MEDICINE & REHABILITATION     PROGRESS NOTE  Subjective/Complaints:  Patient sitting up in a wheelchair.  He is already eaten.  He denies any pain.  He has no concerns.    Objective: Vital Signs: Blood pressure 121/73, pulse 81, temperature 98 F (36.7 C), temperature source Oral, resp. rate 14, height 5\' 6"  (1.676 m), weight 154 lb 8.7 oz (70.1 kg), SpO2 99 %.  Well-developed well-nourished male in no acute distress.  HEENT exam atraumatic, normocephalic, extra ocular muscles are intact. Neck is supple Chest clear to auscultation Cardiac exam S1-S2 regular Abdominal exam thin, active bowel sounds, soft Extremities with trace pretibial edema bilateral lower extremities  Assessment/Plan: 1. Functional deficits secondary to left temporal hemorrhage   Medical Problem List and Plan: 1.  Left gaze preference with right inattention.  He also has receptive greater than expressive a aphasia.2.  DVT right gastroc and soleal veins: eliquis after review with NS  -mobilize today 3. Pain Management: He denies pain at this time. 4. Mood: LCSW to follow for evaluation when appropriate 5. Neuropsych: This patient is not capable of making decisions on his own behalf. 6. Skin/Wound Care: Monitor incision for healing. Maintain adequate hydration and nutritional status.  7. Fluids/Electrolytes/Nutrition: Monitor I/O.    Basic Metabolic Panel:    Component Value Date/Time   NA 137 04/16/2018 0558   K 3.5 04/16/2018 0558   CL 104 04/16/2018 0558   CO2 24 04/16/2018 0558   BUN 14 04/16/2018 0558   CREATININE 0.97 04/16/2018 0558   CREATININE 0.83 01/22/2014 0001   GLUCOSE 182 (H) 04/16/2018 0558   CALCIUM 8.2 (L) 04/16/2018 0558    8. HTN: Monitor controlled. 9. T2DM s/p Whipple for pancreatic cancer:  CBG (last 3)  Recent Labs    04/19/18 2120 04/20/18 0703 04/20/18 1144  GLUCAP 253* 129* 183*  Will monitor for now.  He may need more aggressive treatment.  10.  Seizure prophylaxis: Continue Keppra bid 11. Cerebral edema: Hypertonic saline and Cleviprex weaned off 5/12--Sodium has normalized.   12.  Fevers:  Aspiration PNA treated--antibiotics d/ced on 5/7.    UTI +/- atelectasis  Blood cultures likely contaminent  Urine culture E. Faecalis  CXR reviewed, showing atelectasis. Encourage IS   Amoxicillin 500 TID 5/16-5/22 13. A fib with RVR: Heart rate controlled with increase in Metoprolol.  Amiodarone has been weaned to oral route--monitor HR bid and with activity.   Controlled on 5/16 14. Headaches:  Currently using Fioricet. Will continue to monitor 15. ABLA: Monitor for signs of bleeding.   Hb 9.5 on 5/14  Cont to monitor  16. Hypoalbuminemia   Supplement initiated on 5/14  LOS (Days) 5 A FACE TO FACE EVALUATION WAS PERFORMED  Bruce H Swords 04/20/2018 11:42 AM

## 2018-04-21 ENCOUNTER — Inpatient Hospital Stay (HOSPITAL_COMMUNITY): Payer: Managed Care, Other (non HMO)

## 2018-04-21 LAB — GLUCOSE, CAPILLARY
GLUCOSE-CAPILLARY: 130 mg/dL — AB (ref 65–99)
GLUCOSE-CAPILLARY: 217 mg/dL — AB (ref 65–99)
GLUCOSE-CAPILLARY: 176 mg/dL — AB (ref 65–99)
Glucose-Capillary: 135 mg/dL — ABNORMAL HIGH (ref 65–99)

## 2018-04-21 LAB — CULTURE, BLOOD (ROUTINE X 2)
Culture: NO GROWTH
SPECIAL REQUESTS: ADEQUATE

## 2018-04-21 NOTE — Progress Notes (Signed)
[place] PHYSICAL MEDICINE & REHABILITATION     PROGRESS NOTE  Subjective/Complaints:  Lying in bed this morning.  No complaints.  He feels well.  He is encouraged by therapy.  Objective: Vital Signs: Blood pressure 119/76, pulse 79, temperature 98.1 F (36.7 C), temperature source Oral, resp. rate 18, height 5\' 6"  (1.676 m), weight 154 lb 1.6 oz (69.9 kg), SpO2 100 %.  No acute distress. HEENT exam atraumatic, normocephalic Neck is supple Chest clear to auscultation Cardiac exam S1-S2 regular Abdominal exam active bowel sounds, soft Extremities without edema.  Assessment/Plan: 1. Functional deficits secondary to left temporal hemorrhage   Medical Problem List and Plan: 1.  Left gaze preference with right inattention.  He also has receptive greater than expressive a aphasia. 2.  DVT right gastroc and soleal veins: eliquis after review with NS  -mobilize today 3. Pain Management: He denies pain at this time. 4. Mood: LCSW to follow for evaluation when appropriate 5. Neuropsych: This patient is not capable of making decisions on his own behalf. 6. Skin/Wound Care: No concerns at this time. 7. Fluids/Electrolytes/Nutrition: Monitor I/O.    Basic Metabolic Panel:    Component Value Date/Time   NA 137 04/16/2018 0558   K 3.5 04/16/2018 0558   CL 104 04/16/2018 0558   CO2 24 04/16/2018 0558   BUN 14 04/16/2018 0558   CREATININE 0.97 04/16/2018 0558   CREATININE 0.83 01/22/2014 0001   GLUCOSE 182 (H) 04/16/2018 0558   CALCIUM 8.2 (L) 04/16/2018 0558    8. HTN: Monitor controlled. 9. T2DM s/p Whipple for pancreatic cancer:  CBG (last 3)  Recent Labs    04/20/18 1703 04/20/18 2140 04/21/18 0637  GLUCAP 155* 209* 130*  We will continue to monitor.  May need to consider metformin at some point.  10. Seizure prophylaxis: Continue Keppra bid 11. Cerebral edema: Hypertonic saline and Cleviprex weaned off 5/12--Sodium has normalized.   12.  Fevers:  Currently afebrile.   He has been treated for infections during this hospitalization as listed below.  He is currently on amoxicillin for urinary tract infection.  Aspiration PNA treated--antibiotics d/ced on 5/7.    UTI +/- atelectasis  Blood cultures likely contaminent  Urine culture E. Faecalis  CXR reviewed, showing atelectasis. Encourage IS   Amoxicillin 500 TID 5/16-5/22 13. A fib with RVR: Currently appears to be in sinus rhythm by exam.  Reviewed last EKG also in sinus rhythm on May 6.  14. Headaches: Denies headache. 15. ABLA: Monitor for signs of bleeding.   Hb 9.5 on 5/14  Cont to monitor  16. Hypoalbuminemia   Supplement initiated on 5/14  LOS (Days) 6 A FACE TO FACE EVALUATION WAS PERFORMED  Bruce H Swords 04/21/2018 9:00 AM

## 2018-04-21 NOTE — Progress Notes (Signed)
Physical Therapy Session Note  Patient Details  Name: Hector Pugh MRN: [id number] Date of Birth: 1961/10/01  Today's Date: 04/21/2018 PT Individual Time: 7106-2694 PT Individual Time Calculation (min): 58 min   Short Term Goals: Week 1:  PT Short Term Goal 1 (Week 1): Pt will ambulate without AD x 150 ft with min assist PT Short Term Goal 2 (Week 1): Pt will ascend/descend 12 steps with B handrails and min assist PT Short Term Goal 3 (Week 1): Pt will attend to R side during 75% of functional mobility tasks  Skilled Therapeutic Interventions/Progress Updates:    Pt seated in w/c upon PT arrival, agreeable to therapy tx and denies pain. Pt ambulated from room>gym x 200 ft without AD, min guard. Pt worked on standing dynamic balance tasks this session including standing on airex to toss horseshoes, sidestepping, backwards ambulation, toe taps on colored cones, picking up cones off floor. Pt ambulated from unit>outside with min guard, no AD. Pt worked on ambulation on unlevel surfaces, min assist. Pt ambulated from outside>dayroom x 300 ft with min guard assist. Pt used nustep x 6 minutes on workload 6 for global strengthening, occasional verbal cues for R LE positioning. Pt ambulated to gym min guard. Pt worked on dynamic balance to complete obstacle course. Pt worked on R sided attention to perform card matching activity. Pt ambulated back to room and left supine in bed with family present and bed alarm set.  Therapy Documentation Precautions:  Precautions Precautions: Fall Precaution Comments: inattentive R side, word finding deficits  Restrictions Weight Bearing Restrictions: No   See Function Navigator for Current Functional Status.   Therapy/Group: Individual Therapy  Netta Corrigan, PT, DPT 04/21/2018, 8:03 AM

## 2018-04-22 ENCOUNTER — Inpatient Hospital Stay (HOSPITAL_COMMUNITY): Payer: Managed Care, Other (non HMO) | Admitting: Occupational Therapy

## 2018-04-22 ENCOUNTER — Inpatient Hospital Stay (HOSPITAL_COMMUNITY): Payer: Managed Care, Other (non HMO) | Admitting: Speech Pathology

## 2018-04-22 ENCOUNTER — Inpatient Hospital Stay (HOSPITAL_COMMUNITY): Payer: Managed Care, Other (non HMO) | Admitting: Physical Therapy

## 2018-04-22 LAB — GLUCOSE, CAPILLARY
GLUCOSE-CAPILLARY: 147 mg/dL — AB (ref 65–99)
GLUCOSE-CAPILLARY: 140 mg/dL — AB (ref 65–99)
Glucose-Capillary: 191 mg/dL — ABNORMAL HIGH (ref 65–99)
Glucose-Capillary: 195 mg/dL — ABNORMAL HIGH (ref 65–99)

## 2018-04-22 NOTE — Progress Notes (Signed)
Occupational Therapy Session Note  Patient Details  Name: Hector Pugh MRN: [id number] Date of Birth: 1961/10/26  Today's Date: 04/22/2018 OT Individual Time: 1005-1100 and 1345-1430 OT Individual Time Calculation (min): 55 min and 45 min   Short Term Goals: Week 1:  OT Short Term Goal 1 (Week 1): Pt will complete bathing with min assist at sit > stand level OT Short Term Goal 2 (Week 1): Pt will complete LB dressing with min assist at sit > stand level OT Short Term Goal 3 (Week 1): Pt will complete toilet transfer with min assist with LRAD OT Short Term Goal 4 (Week 1): Pt will complete 2 grooming tasks in standing with min cues  Skilled Therapeutic Interventions/Progress Updates:    1) Treatment session with focus on ADL retraining with functional transfers, dynamic standing balance, and Rt attention during self-care tasks of bathing and dressing.  Pt received upright in w/c requesting shower.  Pt ambulated to room shower without AD with min guard and min cues to attend to Rt visual field during mobility.  Pt completed toilet transfer and toileting with supervision, bathing at sit > stand level in room shower with min guard and setup for items.  Pt alternated single leg stance while drying lower legs post shower with therapist providing min assist for dynamic standing balance.  Completed dressing at sit > stand level with cues to don socks after therapist donned TEDS.  Ambulated to therapy gym without AD with cues to attend to Rt visual field.  Engaged in peg board activity with RUE, noted increased time when visually scanning to Rt side of pattern.  Returned to room as above and left semi-reclined in bed to rest per pt request.  2) Treatment session with focus on visual scanning and attention to Rt visual field.  Engaged in [place] in standing x3 with focus on attention to stimulus in [place].  Pt requiring increased time to locate items in Rt visual field compared to Lt, notably  increased time in Rt lower quadrant even when compared to Rt upper quadrant (average: upper 3.12 seconds and lower 3.95 seconds) compared to 1.33 and 1.88 on Rt upper and lower respectively.  Engaged in visual scanning to complete jigsaw puzzle with pt requiring max cues for sequencing and problem solving.  Pt demonstrating improved success when provided with fewer pieces at a time and focus on certain areas of puzzle to completion.  Pt returned to room and completed toilet transfer and toileting with supervision.  Pt left up in w/c with nursing staff arriving.  Therapy Documentation Precautions:  Precautions Precautions: Fall Precaution Comments: inattentive R side, word finding deficits  Restrictions Weight Bearing Restrictions: No Pain:  Pt with no c/o pain  See Function Navigator for Current Functional Status.   Therapy/Group: Individual Therapy  Simonne Come 04/22/2018, 12:45 PM

## 2018-04-22 NOTE — Progress Notes (Signed)
ANTICOAGULATION CONSULT NOTE - Initial Consult  Pharmacy Consult for Eliquis Indication: VTE prophylaxis  No Known Allergies  Patient Measurements: Height: 5\' 6"  (167.6 cm) Weight: 152 lb 8.9 oz (69.2 kg) IBW/kg (Calculated) : 63.8  Vital Signs: Temp: 98 F (36.7 C) (05/20 0503) Temp Source: Oral (05/20 0503) BP: 97/68 (05/20 0801) Pulse Rate: 86 (05/20 0801)  Labs: No results for input(s): HGB, HCT, PLT, APTT, LABPROT, INR, HEPARINUNFRC, HEPRLOWMOCWT, CREATININE, CKTOTAL, CKMB, TROPONINI in the last 72 hours.  Estimated Creatinine Clearance: 74.9 mL/min (by C-G formula based on SCr of 0.97 mg/dL).    Assessment: 13 YOM admitted on 5/3 with large temporal lobe hemorrhagic stroke Found to have new RLE DVT on doppler, team discussed with Dr. Christella Noa (Neurosurgery) cleared to start anticoagulation Hgb 9.5, pltc 234K on 5/14, est. Crcl ~ 80.   Spoke to patient today about apixaban therapy. Will need reinforcement (preferably when daughter present). No bleeding noted. Last CBC 5/14 (Hgb stable)   Goal of Therapy:  Monitor platelets by anticoagulation protocol: Yes   Plan:  Eliquis 10mg  PO BID x 7 days through 5/22 Then Eliquis 5mg  BID from 5/23  Amiree No A. Levada Dy, PharmD, [place] Pager: [phone]    04/22/2018,1:47 PM

## 2018-04-22 NOTE — Progress Notes (Signed)
Hector Pugh PHYSICAL MEDICINE & REHABILITATION     PROGRESS NOTE  Subjective/Complaints:  Patient seen sitting up in his chair this morning.  He states he slept well overnight, sleep chart.  Patient states he had one.  His cognition appears to be improving.  ROS: Limited due to aphasia, but appears to deny CP, S OB, nausea, vomiting, diarrhea.  Objective: Vital Signs: Blood pressure 97/68, pulse 86, temperature 98 F (36.7 C), temperature source Oral, resp. rate 18, height 5\' 6"  (1.676 m), weight 69.2 kg (152 lb 8.9 oz), SpO2 98 %. No results found. No results for input(s): WBC, HGB, HCT, PLT in the last 72 hours. No results for input(s): NA, K, CL, GLUCOSE, BUN, CREATININE, CALCIUM in the last 72 hours.  Invalid input(s): CO CBG (last 3)  Recent Labs    04/21/18 1648 04/21/18 2105 04/22/18 0632  GLUCAP 135* 176* 147*    Wt Readings from Last 3 Encounters:  04/22/18 69.2 kg (152 lb 8.9 oz)  04/05/18 72.5 kg (159 lb 13.3 oz)  04/04/18 71.7 kg (158 lb)    Physical Exam:  BP 97/68   Pulse 86   Temp 98 F (36.7 C) (Oral)   Resp 18   Ht 5\' 6"  (1.676 m)   Wt 69.2 kg (152 lb 8.9 oz)   SpO2 98%   BMI 24.62 kg/m  Constitutional: No distress . Vital signs reviewed. HENT: Normocephalic.  Craniotomy incision C/D/I Eyes: EOMI. No discharge. Cardiovascular: RRR. No JVD. Respiratory: CTA Bilaterally. Normal effort. GI: BS +. Non-distended. Musc: Improving RUE/RLE edema. Neurological: He is alert and oriented x2, confused, improving Right inattention with left gaze preference and inability to move eyes to right lateral field  Right facial weakness with minimal dysarthria. Fluent aphasia with apraxia, improving Motor: LUE/LLE: 5/5 proximal to distal RUE: 4/5 proximal to distal  RLE: 4+/5 proximal to distal (stable) Skin: See above  Psychiatric: Appears to have normal behavior and normal affect.  Assessment/Plan: 1. Functional deficits secondary to left temporal hemorrhage  which require 3+ hours per day of interdisciplinary therapy in a comprehensive inpatient rehab setting. Physiatrist is providing close team supervision and 24 hour management of active medical problems listed below. Physiatrist and rehab team continue to assess barriers to discharge/monitor patient progress toward functional and medical goals.  Function:  Bathing Bathing position Bathing activity did not occur: Refused Position: Production manager parts bathed by patient: Right arm, Left arm, Chest, Front perineal area, Abdomen, Right upper leg, Buttocks, Left upper leg, Left lower leg, Right lower leg Body parts bathed by helper: Back  Bathing assist Assist Level: Supervision or verbal cues      Upper Body Dressing/Undressing Upper body dressing   What is the patient wearing?: Pull over shirt/dress     Pull over shirt/dress - Perfomed by patient: Thread/unthread right sleeve, Thread/unthread left sleeve, Put head through opening, Pull shirt over trunk Pull over shirt/dress - Perfomed by helper: Pull shirt over trunk        Upper body assist Assist Level: Supervision or verbal cues      Lower Body Dressing/Undressing Lower body dressing   What is the patient wearing?: Non-skid slipper socks, Pants, Underwear, [employer]  - Performed by patient: Thread/unthread right underwear leg, Thread/unthread left underwear leg, Pull underwear up/down Underwear - Performed by helper: Thread/unthread right underwear leg, Thread/unthread left underwear leg Pants- Performed by patient: Thread/unthread right pants leg, Thread/unthread left pants leg, Pull pants up/down Pants- Performed by helper: Thread/unthread  right pants leg, Thread/unthread left pants leg Non-skid slipper socks- Performed by patient: Don/doff right sock, Don/doff left sock Non-skid slipper socks- Performed by helper: Don/doff right sock, Don/doff left sock               TED Hose - Performed by helper:  Don/doff right TED hose, Don/doff left TED hose  Lower body assist Assist for lower body dressing: Supervision or verbal cues   Set up : Don/doff TED stockings  Toileting Toileting   Toileting steps completed by patient: Adjust clothing prior to toileting, Adjust clothing after toileting Toileting steps completed by helper: Performs perineal hygiene Toileting Assistive Devices: Grab bar or rail  Toileting assist Assist level: Touching or steadying assistance (Pt.75%)   Transfers Chair/bed transfer   Chair/bed transfer method: Ambulatory Chair/bed transfer assist level: Touching or steadying assistance (Pt > 75%) Chair/bed transfer assistive device: Armrests     Locomotion Ambulation     Max distance: 266ft Assist level: Touching or steadying assistance (Pt > 75%)   Wheelchair          Cognition Comprehension Comprehension assist level: Understands basic 50 - 74% of the time/ requires cueing 25 - 49% of the time  Expression Expression assist level: Expresses basic 75 - 89% of the time/requires cueing 10 - 24% of the time. Needs helper to occlude trach/needs to repeat words.  Social Interaction Social Interaction assist level: Interacts appropriately 75 - 89% of the time - Needs redirection for appropriate language or to initiate interaction.  Problem Solving Problem solving assist level: Solves basic 75 - 89% of the time/requires cueing 10 - 24% of the time  Memory Memory assist level: Recognizes or recalls 75 - 89% of the time/requires cueing 10 - 24% of the time    Medical Problem List and Plan: 1.  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  DVT right gastroc and soleal veins: eliquis after review with NS 3. Pain Management: Monitor HA as activity increases. Continue Fioricet.   Intermittent headaches controlled with medications 4. Mood: LCSW to follow for  evaluation when appropriate 5. Neuropsych: This patient is not capable of making decisions on his own behalf. 6. Skin/Wound Care: Monitor incision for healing. Maintain adequate hydration and nutritional status.  7. Fluids/Electrolytes/Nutrition: Monitor I/O.   Advanced to regular texture diet 8. HTN: Monitor BP bid with SBP goal < 140. Norvasc added on 5/9   Relatively controlled on 5/20  Monitor with increased mobility 9. T2DM s/p Whipple for pancreatic cancer: Hgb A1C- 6.8.  Glucotrol 5mg  resumed   Remains elevated, will consider further increase tomorrow 10. Seizure prophylaxis: Continue Keppra bid 11. Cerebral edema: Hypertonic saline and Cleviprex weaned off 5/12--Sodium has normalized.   12.  Fevers:  Aspiration PNA treated--antibiotics d/ced on 5/7.    UTI +/- atelectasis +/- DVT  Blood cultures likely contaminent  Urine culture E. Faecalis  CXR reviewed, showing atelectasis. Encourage IS   Amoxicillin 500 TID 5/16-5/22 13. A fib with RVR: Heart rate controlled with increase in Metoprolol.  Amiodarone has been weaned to oral route--monitor HR bid and with activity.   Controlled on 5/20 14. Headaches:  Currently using Fioricet. Will continue to monitor 15. ABLA: Monitor for signs of bleeding.   Hb 9.5 on 5/14   Labs ordered for tomorrow  Cont to monitor  16. Hypoalbuminemia   Supplement initiated on 5/14  LOS (Days) 7 A FACE  TO FACE EVALUATION WAS PERFORMED  Hector Pugh 04/22/2018 10:18 AM

## 2018-04-22 NOTE — Progress Notes (Signed)
Physical Therapy Session Note  Patient Details  Name: Hector Pugh MRN: [id number] Date of Birth: 10/15/1961  Today's Date: 04/22/2018 PT Individual Time: 0830-0915 PT Individual Time Calculation (min): 45 min   Short Term Goals: Week 1:  PT Short Term Goal 1 (Week 1): Pt will ambulate without AD x 150 ft with min assist PT Short Term Goal 2 (Week 1): Pt will ascend/descend 12 steps with B handrails and min assist PT Short Term Goal 3 (Week 1): Pt will attend to R side during 75% of functional mobility tasks  Skilled Therapeutic Interventions/Progress Updates: Pt presented in w/c agreeable to therapy. Pt transported to day room for energy conservation and time management. Pt participated in [employer] LOS x 3 trials. Pt required moderate multimodal cues for improving hip strategy. Pt ambulated to rehab gym no AD with min guard and participated in balance activities including basketball toss on compliant and non-compliant surfaces. Pt noted to attempt to throw/catch with BUE and intermittent reach for ball with RUE. Pt also tossed horse shoes for fofrced use of RUE with LE on 6in step both ipsilateral and contralateral. Pt ambulated back to room no AD min guard and returned to w/c. Pt remained in w/c at end of session with quick release belt placed, call bell within reach, and needs met.      Therapy Documentation Precautions:  Precautions Precautions: Fall Precaution Comments: inattentive R side, word finding deficits  Restrictions Weight Bearing Restrictions: No Other Treatments:     See Function Navigator for Current Functional Status.   Therapy/Group: Individual Therapy      , PTA  04/22/2018, 2:18 PM  

## 2018-04-22 NOTE — Progress Notes (Signed)
Speech Language Pathology Daily Session Note  Patient Details  Name: Hector Pugh MRN: [id number] Date of Birth: 01-22-1961  Today's Date: 04/22/2018 SLP Individual Time: 1115-1200 SLP Individual Time Calculation (min): 45 min  Short Term Goals: Week 1: SLP Short Term Goal 1 (Week 1): Pt will consume regular textures and thin liquids with mod I use of swallowing precautions to clear residuals from the oral cavity.   SLP Short Term Goal 2 (Week 1): Pt will follow 1 step commands during functional tasks for 75% accuracy with mod assist multimodal cues.   SLP Short Term Goal 3 (Week 1): Pt will recognize and correct verbal errors at the word level with mod assist multimodal cues.   SLP Short Term Goal 4 (Week 1): Pt will locate items to the right of midline during functional tasks in 75% of opportunities with mod assist multimodal cues.    Skilled Therapeutic Interventions: Skilled treatment session focused on communication goals. SLP facilitated session by providing Min A to name common objects with written word present to achieve ~ 95% accuracy. Pt required Min A cues to write names of objects with cues given specifically for phoneme substitutions. Pt very social with good use of phrases "nice to meet you" and able to answer yes/no questions about sessions earlier in day.      Function:  Eating Eating                 Cognition Comprehension Comprehension assist level: Understands basic 50 - 74% of the time/ requires cueing 25 - 49% of the time;Understands basic 75 - 89% of the time/ requires cueing 10 - 24% of the time  Expression   Expression assist level: Expresses basic 50 - 74% of the time/requires cueing 25 - 49% of the time. Needs to repeat parts of sentences.  Social Interaction Social Interaction assist level: Interacts appropriately 75 - 89% of the time - Needs redirection for appropriate language or to initiate interaction.  Problem Solving Problem solving assist level:  Solves basic 75 - 89% of the time/requires cueing 10 - 24% of the time  Memory Memory assist level: Recognizes or recalls 75 - 89% of the time/requires cueing 10 - 24% of the time    Pain    Therapy/Group: Individual Therapy  Coulter Oldaker 04/22/2018, 12:52 PM

## 2018-04-23 ENCOUNTER — Inpatient Hospital Stay (HOSPITAL_COMMUNITY): Payer: Managed Care, Other (non HMO) | Admitting: Occupational Therapy

## 2018-04-23 ENCOUNTER — Inpatient Hospital Stay (HOSPITAL_COMMUNITY): Payer: Managed Care, Other (non HMO) | Admitting: Physical Therapy

## 2018-04-23 ENCOUNTER — Inpatient Hospital Stay (HOSPITAL_COMMUNITY): Payer: Managed Care, Other (non HMO) | Admitting: Speech Pathology

## 2018-04-23 LAB — CBC
HCT: 28.9 % — ABNORMAL LOW (ref 39.0–52.0)
Hemoglobin: 9.5 g/dL — ABNORMAL LOW (ref 13.0–17.0)
MCH: 27.7 pg (ref 26.0–34.0)
MCHC: 32.9 g/dL (ref 30.0–36.0)
MCV: 84.3 fL (ref 78.0–100.0)
PLATELETS: 399 10*3/uL (ref 150–400)
RBC: 3.43 MIL/uL — ABNORMAL LOW (ref 4.22–5.81)
RDW: 13.3 % (ref 11.5–15.5)
WBC: 6 10*3/uL (ref 4.0–10.5)

## 2018-04-23 LAB — BASIC METABOLIC PANEL
Anion gap: 8 (ref 5–15)
BUN: 10 mg/dL (ref 6–20)
CHLORIDE: 104 mmol/L (ref 101–111)
CO2: 26 mmol/L (ref 22–32)
CREATININE: 0.92 mg/dL (ref 0.61–1.24)
Calcium: 8.8 mg/dL — ABNORMAL LOW (ref 8.9–10.3)
GFR calc Af Amer: >60 mL/min (ref >60)
GFR calc non Af Amer: >60 mL/min (ref >60)
Glucose, Bld: 108 mg/dL — ABNORMAL HIGH (ref 65–99)
Potassium: 3.8 mmol/L (ref 3.5–5.1)
SODIUM: 138 mmol/L (ref 135–145)

## 2018-04-23 LAB — GLUCOSE, CAPILLARY
Glucose-Capillary: 113 mg/dL — ABNORMAL HIGH (ref 65–99)
Glucose-Capillary: 162 mg/dL — ABNORMAL HIGH (ref 65–99)
Glucose-Capillary: 116 mg/dL — ABNORMAL HIGH (ref 65–99)
Glucose-Capillary: 235 mg/dL — ABNORMAL HIGH (ref 65–99)

## 2018-04-23 NOTE — Progress Notes (Signed)
Patient was Ambulating with Clarise Cruz OT and walk into the wall. Daughter notified and VS checked and WNL. Dan PA notified.

## 2018-04-23 NOTE — Progress Notes (Signed)
[facility]IC[facility] RE[facility]ABILITATION     PROGRESS NOTE  Subjective/Complaints:  Patient seen sitting up in bed this morning. He states he slept well overnight. His cognition appears to continue to slowly improve.  ROS: Limited due to aphasia, but appears to deny CP, S OB, nausea, vomiting, diarrhea.  Objective: Vital Signs: Blood pressure 109/71, pulse 75, temperature 98.5 F (36.9 C), temperature source Oral, resp. rate 20, height 5\' 6"  (1.676 m), weight 69.2 kg (152 lb 8.9 oz), SpO2 98 %. No results found. Recent Labs    04/23/18 0612  WBC 6.0  HGB 9.5*  HCT 28.9*  PLT 399   Recent Labs    04/23/18 0612  NA 138  K 3.8  CL 104  GLUCOSE 108*  BUN 10  CREATININE 0.92  CALCIUM 8.8*   CBG (last 3)  Recent Labs    04/22/18 1656 04/22/18 2110 04/23/18 0652  GLUCAP 140* 195* 113*    Wt Readings from Last 3 Encounters:  04/23/18 69.2 kg (152 lb 8.9 oz)  04/05/18 72.5 kg (159 lb 13.3 oz)  04/04/18 71.7 kg (158 lb)    Physical Exam:  BP 109/71   Pulse 75   Temp 98.5 F (36.9 C) (Oral)   Resp 20   Ht 5\' 6"  (1.676 m)   Wt 69.2 kg (152 lb 8.9 oz)   SpO2 98%   BMI 24.62 kg/m  Constitutional: No distress . Vital signs reviewed. HENT: Normocephalic.  Craniotomy incision C/D/I Eyes: EOMI. No discharge. Cardiovascular: RRR. No JVD. Respiratory: CTA Bilaterally. Normal effort. GI: BS +. Non-distended. Musc: Improving RUE/RLE edema. Neurological: He is alert.  Right inattention improving Right facial weakness with minimal dysarthria. Fluent aphasia with apraxia, improving Motor: LUE/LLE: 5/5 proximal to distal RUE: 4/5 proximal to distal (stable) RLE: 4+/5 proximal to distal (stable) Skin: See above  Psychiatric: Appears to have normal behavior and normal affect.  Assessment/Plan: 1. Functional deficits secondary to left temporal hemorrhage which require 3+ hours per day of interdisciplinary therapy in a comprehensive inpatient rehab  setting. Physiatrist is providing close team supervision and 24 hour management of active medical problems listed below. Physiatrist and rehab team continue to assess barriers to discharge/monitor patient progress toward functional and medical goals.  Function:  Bathing Bathing position Bathing activity did not occur: Refused Position: Production manager parts bathed by patient: Right arm, Left arm, Chest, Front perineal area, Abdomen, Right upper leg, Buttocks, Left upper leg, Left lower leg, Right lower leg Body parts bathed by helper: Back  Bathing assist Assist Level: Supervision or verbal cues      Upper Body Dressing/Undressing Upper body dressing   What is the patient wearing?: Pull over shirt/dress     Pull over shirt/dress - Perfomed by patient: Thread/unthread right sleeve, Thread/unthread left sleeve, Put head through opening, Pull shirt over trunk Pull over shirt/dress - Perfomed by helper: Pull shirt over trunk        Upper body assist Assist Level: Supervision or verbal cues      Lower Body Dressing/Undressing Lower body dressing   What is the patient wearing?: Underwear, Pants, Non-skid slipper socks, Ted Hose Underwear - Performed by patient: Thread/unthread right underwear leg, Thread/unthread left underwear leg, Pull underwear up/down Underwear - Performed by helper: Thread/unthread right underwear leg, Thread/unthread left underwear leg Pants- Performed by patient: Thread/unthread right pants leg, Thread/unthread left pants leg, Pull pants up/down Pants- Performed by helper: Thread/unthread right pants leg, Thread/unthread left pants leg Non-skid  slipper socks- Performed by patient: Don/doff right sock, Don/doff left sock Non-skid slipper socks- Performed by helper: Don/doff right sock, Don/doff left sock               TED Hose - Performed by helper: Don/doff right TED hose, Don/doff left TED hose  Lower body assist Assist for lower body  dressing: Set up, Supervision or verbal cues   Set up : Don/doff TED stockings  Toileting Toileting   Toileting steps completed by patient: Adjust clothing prior to toileting, Performs perineal hygiene, Adjust clothing after toileting Toileting steps completed by helper: Performs perineal hygiene Toileting Assistive Devices: Grab bar or rail  Toileting assist Assist level: Supervision or verbal cues   Transfers Chair/bed transfer   Chair/bed transfer method: Ambulatory Chair/bed transfer assist level: Touching or steadying assistance (Pt > 75%) Chair/bed transfer assistive device: Armrests     Locomotion Ambulation     Max distance: 225ft Assist level: Touching or steadying assistance (Pt > 75%)   Wheelchair          Cognition Comprehension Comprehension assist level: Understands basic 75 - 89% of the time/ requires cueing 10 - 24% of the time  Expression Expression assist level: Expresses basic 50 - 74% of the time/requires cueing 25 - 49% of the time. Needs to repeat parts of sentences.  Social Interaction Social Interaction assist level: Interacts appropriately 50 - 74% of the time - May be physically or verbally inappropriate.  Problem Solving Problem solving assist level: Solves basic 50 - 74% of the time/requires cueing 25 - 49% of the time  Memory Memory assist level: Recognizes or recalls 50 - 74% of the time/requires cueing 25 - 49% of the time    Medical Problem List and Plan: 1.  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  DVT right gastroc and soleal veins: eliquis after review with NS 3. Pain Management: Monitor HA as activity increases. Continue Fioricet.   Intermittent headaches controlled with medications 4. Mood: LCSW to follow for evaluation when appropriate 5. Neuropsych: This patient is not capable of making decisions on his own behalf. 6.  Skin/Wound Care: Monitor incision for healing. Maintain adequate hydration and nutritional status.  7. Fluids/Electrolytes/Nutrition: Monitor I/O.   Advanced to regular texture diet 8. HTN: Monitor BP bid with SBP goal < 140. Norvasc added on 5/9   Controlled on 5/21  Monitor with increased mobility 9. T2DM s/p Whipple for pancreatic cancer: Hgb A1C- 6.8.  Glucotrol 5mg  resumed   Labile on 5/21 10. Seizure prophylaxis: Keppra bid d/ced on 5/21 11. Cerebral edema: Hypertonic saline and Cleviprex weaned off 5/12--Sodium has normalized.   12.  Fevers: Resolved   UTI +/- atelectasis +/- DVT  Blood cultures likely contaminent  Urine culture E. Faecalis  CXR reviewed, showing atelectasis. Encourage IS  Amoxicillin 500 TID 5/16-5/22 13. A fib with RVR: Heart rate controlled with increase in Metoprolol.  Amiodarone has been weaned to oral route--monitor HR bid and with activity.   Controlled on 5/21 14. Headaches:  Currently using Fioricet. Will continue to monitor 15. ABLA: Monitor for signs of bleeding.   Hb 9.5 on 5/21  Cont to monitor  16. Hypoalbuminemia   Supplement initiated on 5/14  LOS (Days) 8 A FACE TO FACE EVALUATION WAS PERFORMED  Burnis Kaser Darric Plante 04/23/2018 8:51 AM

## 2018-04-23 NOTE — Progress Notes (Signed)
Occupational Therapy Weekly Progress Note  Patient Details  Name: Hector Pugh MRN: [id number] Date of Birth: 1961-01-03  Beginning of progress report period: Apr 16, 2018 End of progress report period: Apr 23, 2018  Today's Date: 04/23/2018 OT Individual Time: 1975-8832 and 1305-1350 OT Individual Time Calculation (min): 60 min and 45 min   Patient has met 4 of 4 short term goals.  Pt is making steady progress towards goals.  Pt currently requires min guard to supervision with functional transfers with and without AD due to Rt inattention.  Pt requires min cues for sequencing and initiation with self-care tasks of bathing and dressing.  Pt continues to demonstrate Rt inattention impacting safety with mobility and self-care tasks.  Pt will require supervision with all ADLs and mobility due to Rt inattention and decreased awareness of deficits.  Patient continues to demonstrate the following deficits: muscle weakness, decreased coordination and decreased motor planning, decreased visual perceptual skills and hemianopsia, decreased attention to right, decreased initiation, decreased awareness, decreased problem solving, decreased safety awareness and decreased memory and decreased standing balance, decreased postural control and decreased balance strategies and therefore will continue to benefit from skilled OT intervention to enhance overall performance with BADL and Reduce care partner burden.  Patient progressing toward long term goals..  Continue plan of care.  OT Short Term Goals Week 1:  OT Short Term Goal 1 (Week 1): Pt will complete bathing with min assist at sit > stand level OT Short Term Goal 1 - Progress (Week 1): Met OT Short Term Goal 2 (Week 1): Pt will complete LB dressing with min assist at sit > stand level OT Short Term Goal 2 - Progress (Week 1): Met OT Short Term Goal 3 (Week 1): Pt will complete toilet transfer with min assist with LRAD OT Short Term Goal 3 - Progress  (Week 1): Met OT Short Term Goal 4 (Week 1): Pt will complete 2 grooming tasks in standing with min cues OT Short Term Goal 4 - Progress (Week 1): Met Week 2:  OT Short Term Goal 1 (Week 2): STG = LTGs due to remaining LOS  Skilled Therapeutic Interventions/Progress Updates:    1) Treatment session with focus on ADL retraining with functional mobility, dynamic standing balance, and Rt attention during mobility.  Pt received upright in w/c requesting to bathe at shower level.  Mod cues for pt to pick out clothes prior to bathing with cues to initiate and to ensure pt has all needed clothing items (family has been picking out clothes).  Pt ambulated to room shower with min guard and completed bathing at sit > stand level with supervision while bathing.  Min cues for initiation of bathing with soap and thoroughness with bathing.  Pt completed dressing tasks at sit > stand level without assist.  Ambulated to therapy gym with min cues to avoid obstacles.  Engaged in activity to locate horse shoes throughout gym with focus on visual attention and scanning to Rt visual field, pt with no awareness of deficit and decreased ability to scan to Rt even when standing with target just to Rt of midline.  Utilized card board to [employer] cards in Rt visual field with improved accuracy while seated to complete task.  While ambulating back to room pt noted people coming towards him on Lt and veered to Rt to avoid people and bumped into wall due to Rt inattention.  Pt with no injury, asking multiple questions about why he did not see wall on  his Rt.  Again educated pt on Rt visual impairment post hemorrhage and focus of previous activities.  Vital assessed and WNL.  Pt requested to lie down to rest before next therapy session.  2) Treatment session with focus on Rt attention during functional mobility and seated task.  Pt ambulated to therapy gym without AD with min guard and cues to visually attend to Rt and Lt.  Engaged in  seated visual scanning task with matching cards and locating various written words on paper at midline.  Pt required increased time and head turn to locate items on Rt side of paper.  Pt ambulated throughout therapy hallway to locate numbers in order with focus on visual scanning to Rt environment.  Pt walked up and down hallway 4 times to locate all 10 numbers in sequence in Rt visual field.  Pt perseverating on "why" this happened and how to fix it.  Again educated on visual impairment post hemorrhage and compensation with head turn to attend to Rt visual field.    Therapy Documentation Precautions:  Precautions Precautions: Fall Precaution Comments: inattentive R side, word finding deficits  Restrictions Weight Bearing Restrictions: No General:   Vital Signs: Therapy Vitals Temp: 98.5 F (36.9 C) Temp Source: Oral Pulse Rate: 77 Resp: 20 BP: 111/72 Patient Position (if appropriate): Lying Oxygen Therapy SpO2: 98 % O2 Device: Room Air Pain:  Pt with no c/o pain  See Function Navigator for Current Functional Status.   Therapy/Group: Individual Therapy  Simonne Come 04/23/2018, 7:26 AM

## 2018-04-23 NOTE — Progress Notes (Signed)
Physical Therapy Weekly Progress Note  Patient Details  Name: Hector Pugh MRN: [id number] Date of Birth: 1961-01-14  Beginning of progress report period: Apr 16, 2018 End of progress report period: Apr 23, 2018  Today's Date: 04/23/2018 PT Individual Time: 1000-1100 PT Individual Time Calculation (min): 60 min   Patient has met 1 of 3 short term goals.  Pt has made good progress during current course of therapy. He has demonstrated improvements in balance, coordination and attention to R side. Pt is currently demonstrating gait without AD at minA to min guard level with intermittent cues for R inattention. Pt continues to require multimodal cues for R attention in moderately distracting environments with pt noting R awareness approx 50% of time.   Patient continues to demonstrate the following deficits muscle weakness and muscle paralysis, impaired timing and sequencing, unbalanced muscle activation, decreased coordination and decreased motor planning and decreased initiation, decreased attention, decreased awareness, decreased problem solving and decreased safety awareness and therefore will continue to benefit from skilled PT intervention to increase functional independence with mobility.  Patient progressing toward long term goals..  Continue plan of care.  PT Short Term Goals Week 1:  PT Short Term Goal 1 (Week 1): Pt will ambulate without AD x 150 ft with min assist PT Short Term Goal 1 - Progress (Week 1): Met PT Short Term Goal 2 (Week 1): Pt will ascend/descend 12 steps with B handrails and min assist PT Short Term Goal 2 - Progress (Week 1): Progressing toward goal PT Short Term Goal 3 (Week 1): Pt will attend to R side during 75% of functional mobility tasks PT Short Term Goal 3 - Progress (Week 1): Progressing toward goal Week 2:     Skilled Therapeutic Interventions/Progress Updates: Pt presented in w/c with dgt present agreeable to therapy. Pt ambulated to rehab gym min  guard with intermittent cues for looking ahead vs looking at ground. Session focused on tracking/scanning in moderately distracting environments. Pt perseverating throughout session on "how can I fix this?" (in reference to R inattention), PTA attempted to explain pt with CVA anpard R inattention currently a deficit. Pt would verbalize understanding but no carryover as would repeat same question shortly after. Pt participated in scanning for objects in hallway with emphasis on R side. Pt required mod to max cues for location of objects throughout/ Pt Pt participated in NuStep L3 x 15 min for endurance and reciprocal activity. Pt ambulated back to room >345f no AD with min guard, no LOB and min cues for object avoidance. Pt returned to w/c at end of session with chair alarm on, quick release belt placed, and current needs met.      Therapy Documentation Precautions:  Precautions Precautions: Fall Precaution Comments: inattentive R side, word finding deficits  Restrictions Weight Bearing Restrictions: No    Vital Signs: Therapy Vitals Temp: 98.7 F (37.1 C) Temp Source: Oral Pulse Rate: 73 Resp: 18 BP: (!) 121/95 Patient Position (if appropriate): Sitting Oxygen Therapy SpO2: 100 % O2 Device: Room Air   See Function Navigator for Current Functional Status.  Therapy/Group: Individual Therapy  Rosita DeChalus 04/23/2018, 3:13 PM

## 2018-04-23 NOTE — Progress Notes (Signed)
Speech Language Pathology Daily Session Note  Patient Details  Name: Hector Pugh MRN: [id number] Date of Birth: 05-Jun-1961  Today's Date: 04/23/2018 SLP Individual Time: 7096-2836 SLP Individual Time Calculation (min): 30 min  Short Term Goals: Week 1: SLP Short Term Goal 1 (Week 1): Pt will consume regular textures and thin liquids with mod I use of swallowing precautions to clear residuals from the oral cavity.   SLP Short Term Goal 2 (Week 1): Pt will follow 1 step commands during functional tasks for 75% accuracy with mod assist multimodal cues.   SLP Short Term Goal 3 (Week 1): Pt will recognize and correct verbal errors at the word level with mod assist multimodal cues.   SLP Short Term Goal 4 (Week 1): Pt will locate items to the right of midline during functional tasks in 75% of opportunities with mod assist multimodal cues.    Skilled Therapeutic Interventions: Skilled treatment session focused on communication goals. SLP facilitated session by providing total A for patient to name functional items and to self-monitor and correct errors at the word level. Patient perseverative on asking clinician, "how to fix" speech errors and required Max A verbal cues for redirection to task. Patient left upright in wheelchair with quick release belt in place. Continue with current plan of care.      Function:   Cognition Comprehension Comprehension assist level: Understands basic 50 - 74% of the time/ requires cueing 25 - 49% of the time  Expression   Expression assist level: Expresses basic 50 - 74% of the time/requires cueing 25 - 49% of the time. Needs to repeat parts of sentences.  Social Interaction Social Interaction assist level: Interacts appropriately 50 - 74% of the time - May be physically or verbally inappropriate.  Problem Solving Problem solving assist level: Solves basic 50 - 74% of the time/requires cueing 25 - 49% of the time  Memory Memory assist level: Recognizes or  recalls 50 - 74% of the time/requires cueing 25 - 49% of the time    Pain No/Denies Pain  Therapy/Group: Individual Therapy  Stuart Guillen 04/23/2018, 2:58 PM

## 2018-04-24 ENCOUNTER — Inpatient Hospital Stay (HOSPITAL_COMMUNITY): Payer: Managed Care, Other (non HMO) | Admitting: Physical Therapy

## 2018-04-24 ENCOUNTER — Inpatient Hospital Stay (HOSPITAL_COMMUNITY): Payer: Managed Care, Other (non HMO) | Admitting: Occupational Therapy

## 2018-04-24 ENCOUNTER — Inpatient Hospital Stay (HOSPITAL_COMMUNITY): Payer: Managed Care, Other (non HMO) | Admitting: Speech Pathology

## 2018-04-24 LAB — GLUCOSE, CAPILLARY
GLUCOSE-CAPILLARY: 124 mg/dL — AB (ref 65–99)
GLUCOSE-CAPILLARY: 192 mg/dL — AB (ref 65–99)
Glucose-Capillary: 105 mg/dL — ABNORMAL HIGH (ref 65–99)
Glucose-Capillary: 151 mg/dL — ABNORMAL HIGH (ref 65–99)

## 2018-04-24 NOTE — Progress Notes (Signed)
Physical Therapy Session Note  Patient Details  Name: Hector Pugh MRN: [id number] Date of Birth: Nov 09, 1961  Today's Date: 04/24/2018 PT Individual Time: 1110-1205 PT Individual Time Calculation (min): 55 min   Short Term Goals: Week 2:    STGs = LTGs due to length of stay  Skilled Therapeutic Interventions/Progress Updates: Pt presented in w/c with dgt present agreeable to therapy. Pt ambulated to rehab gym min guard no AD. Ascend/descend x 12 steps bilateral rails supervision.  Participated in object search/scanning with emphasis on R side. Pt able to locate objects 50% without cues. Ambulated to [facility] gym supervision. Participated in Dynavision mode B with focus on scanning to R. Pt with most noted deficit in right lower quadrant with pt able to scan to upper R quadant 80-100% with 3 trials. Pt participated in Bi[employer] catch game 2 three minute trials. Pt required tactile cues for avoiding compensating with head/shoulders. Participated in NuStep L3 x 10 min for endurance. Pt ambulated back to room min guard/supervision with x 1 occurrence of noted scanning to R to avoid wall object. Pt returned to w/c and left with chair alarm on and needs met.      Therapy Documentation Precautions:  Precautions Precautions: Fall Precaution Comments: inattentive R side, word finding deficits  Restrictions Weight Bearing Restrictions: No   See Function Navigator for Current Functional Status.   Therapy/Group: Individual Therapy  Brad Lieurance  Imogen Maddalena, PTA  04/24/2018, 2:27 PM

## 2018-04-24 NOTE — Progress Notes (Signed)
Speech Language Pathology Weekly Progress and Session Note  Patient Details  Name: Hector Pugh MRN: [id number] Date of Birth: Mar 12, 1961  Beginning of progress report period: Apr 16, 2018 End of progress report period: Apr 24, 2018   Today's Date: 04/24/2018 SLP Individual Time: 0804-0900 SLP Individual Time Calculation (min): 56 min  Short Term Goals: Week 1: SLP Short Term Goal 1 (Week 1): Pt will consume regular textures and thin liquids with mod I use of swallowing precautions to clear residuals from the oral cavity.   SLP Short Term Goal 1 - Progress (Week 1): Met SLP Short Term Goal 2 (Week 1): Pt will follow 1 step commands during functional tasks for 75% accuracy with mod assist multimodal cues.   SLP Short Term Goal 2 - Progress (Week 1): Met SLP Short Term Goal 3 (Week 1): Pt will recognize and correct verbal errors at the word level with mod assist multimodal cues.   SLP Short Term Goal 3 - Progress (Week 1): Progressing toward goal SLP Short Term Goal 4 (Week 1): Pt will locate items to the right of midline during functional tasks in 75% of opportunities with mod assist multimodal cues.   SLP Short Term Goal 4 - Progress (Week 1): Met    New Short Term Goals: Week 2: SLP Short Term Goal 1 (Week 2): STG=LTG due to remaining length of stay   Weekly Progress Updates:   Pt has made functional gains this reporting period and has met 3 out of 4 short term goals.  Pt is currently mod-max assist for tasks due to receptive aphasia and verbal apraxia.  Pt has demonstrated improved visual scanning to the right of midline during structured tasks but still needs cues for awareness of obstacles on his right with functional mobility.  Pt is consuming regular, thin liquids diet with mod I use of swallowing precautions.  Pt at times needs cues for sequencing of tray set up and self feeding due to motor planning impairment  Pt and family education is ongoing.  Pt would continue to benefit from  skilled ST while inpatient in order to maximize functional independence and reduce burden of care prior to discharge.  Anticipate that pt will need 24/7 supervision at discharge in addition to [place] follow at next level of care.      Intensity: Minumum of 1-2 x/day, 30 to 90 minutes Frequency: 3 to 5 out of 7 days Duration/Length of Stay: 12 days  Treatment/Interventions: Cognitive remediation/compensation;Environmental controls;Multimodal Scientist, research (medical);Internal/external aids;Functional tasks;Dysphagia/aspiration precaution training;Patient/family education   Daily Session  Skilled Therapeutic Interventions: Pt was seen for skilled ST targeting goals for dysphagia and communication.  Pt consumed presentations of his currently prescribed diet on breakfast tray with mod I for use of swallowing precautions and no overt s/s of aspiration with solids or liquids.  Pt had x1 instance of ideational apraxia where he used a spoon to consume his orange juice.  When therapist brought error to pt's attention by asking "Do you usually drink orange juice with a spoon?" pt reported that he did and continued to use spoon.  During loosely structured conversations with pt, he demonstrated improved awareness of verbal errors with min verbal cues.   During structured language tasks, pt was able to match picture to written word for 100% accuracy with supervision but needed mod assist verbal and visual cues to correct phoneme substitution errors.  Despite needing increased cues to correct errors, pt is demonstrating increasing use of compensatory word finding strategies  such as description.  Pt was left in wheelchair with quick release belt donned and call bell within reach.  Goals updated on this date to reflect current progress and plan of care.       Function:   Eating Eating   Modified Consistency Diet: No Eating Assist Level: Set up assist for;Swallowing techniques: self managed   Eating  Set Up Assist For: Opening containers       Cognition Comprehension Comprehension assist level: Understands basic 75 - 89% of the time/ requires cueing 10 - 24% of the time  Expression   Expression assist level: Expresses basic 50 - 74% of the time/requires cueing 25 - 49% of the time. Needs to repeat parts of sentences.  Social Interaction Social Interaction assist level: Interacts appropriately 75 - 89% of the time - Needs redirection for appropriate language or to initiate interaction.  Problem Solving Problem solving assist level: Solves basic 50 - 74% of the time/requires cueing 25 - 49% of the time  Memory Memory assist level: Recognizes or recalls 75 - 89% of the time/requires cueing 10 - 24% of the time   General    Pain Pain Assessment Pain Scale: 0-10 Pain Score: 0-No pain  Therapy/Group: Individual Therapy  Lorrena Goranson, Selinda Orion 04/24/2018, 9:01 AM

## 2018-04-24 NOTE — Progress Notes (Signed)
Occupational Therapy Session Note  Patient Details  Name: Hector Pugh MRN: [id number] Date of Birth: 1961-11-18  Today's Date: 04/24/2018 OT Individual Time: 3545-6256 and 1400-1430 OT Individual Time Calculation (min): 45 min and 30 min   Short Term Goals: Week 2:  OT Short Term Goal 1 (Week 2): STG = LTGs due to remaining LOS  Skilled Therapeutic Interventions/Progress Updates:    1) Treatment session with focus on ADL retraining and attention to Rt environment during functional mobility.  Pt ambulated to room shower to engage in bathing at sit > stand level with min guard for mobility.  Pt able to locate door on Rt and open door without cues.  Completed bathing at sit > stand level with min cues for use of soap when bathing and for sequencing.  Dressing completed at sit > stand level to include picking out clothing and donning shoes this session.  Ambulated to therapy gym with cues to visually attend to obstacles on Rt.  Engaged in scavenger hunt in therapy gym to locate familiar items on Rt and Lt to facilitate visual scanning.  Pt required increased time and cues to locate items.  Completed peg board activity with improved visual scanning during seated, structured task.  Returned to room as above and left seated upright with daughter arriving.  2) Treatment session with focus on Rt attention during functional mobility.  Propelled pt to hospital entrance for energy conservation.  Engaged in functional ambulation outside with focus on visual scanning and attention to Rt environment during mobility.  Encouraged pt to count tables and trashcans as well as identify people in Rt visual field during mobility.  Min guard for mobility on uneven surface outside.  Pt with improved scanning to Rt when seated, however continues to require increased cues during mobility.  Therapy Documentation Precautions:  Precautions Precautions: Fall Precaution Comments: inattentive R side, word finding deficits   Restrictions Weight Bearing Restrictions: No Pain: Pain Assessment Pain Scale: 0-10 Pain Score: 0-No pain  See Function Navigator for Current Functional Status.   Therapy/Group: Individual Therapy  Simonne Come 04/24/2018, 10:05 AM

## 2018-04-24 NOTE — Progress Notes (Signed)
[facility]IC[facility] & RE[facility]ABILITATION     PROGRESS NOTE  Subjective/Complaints:  Patient seen sitting up in his chair this morning. He states he slept well overnight. Confirmed with sleep chart.  ROS: Limited due to aphasia, but appears to deny CP, S OB, nausea, vomiting, diarrhea.  Objective: Vital Signs: Blood pressure 112/69, pulse 70, temperature 98 F (36.7 C), temperature source Oral, resp. rate 18, height 5\' 6"  (1.676 m), weight 66.5 kg (146 lb 9.7 oz), SpO2 99 %. No results found. Recent Labs    04/23/18 0612  WBC 6.0  HGB 9.5*  HCT 28.9*  PLT 399   Recent Labs    04/23/18 0612  NA 138  K 3.8  CL 104  GLUCOSE 108*  BUN 10  CREATININE 0.92  CALCIUM 8.8*   CBG (last 3)  Recent Labs    04/23/18 1654 04/23/18 2118 04/24/18 0640  GLUCAP 116* 235* 105*    Wt Readings from Last 3 Encounters:  04/24/18 66.5 kg (146 lb 9.7 oz)  04/05/18 72.5 kg (159 lb 13.3 oz)  04/04/18 71.7 kg (158 lb)    Physical Exam:  BP 112/69 (BP Location: Right Arm)   Pulse 70   Temp 98 F (36.7 C) (Oral)   Resp 18   Ht 5\' 6"  (1.676 m)   Wt 66.5 kg (146 lb 9.7 oz)   SpO2 99%   BMI 23.66 kg/m  Constitutional: No distress . Vital signs reviewed. HENT: Normocephalic.  Craniotomy incision C/D/I Eyes: EOMI. No discharge. Cardiovascular: RRR. No JVD. Respiratory: CTA Bilaterally. Normal effort. GI: BS +. Non-distended. Musc: Improving RUE/RLE edema. Neurological: He is alert.  Right inattention improving Right facial weakness with minimal dysarthria. Fluent aphasia with apraxia, gradually improving Motor: LUE/LLE: 5/5 proximal to distal RUE: 4/5 proximal to distal (unchanged) RLE: 4+/5 proximal to distal (unchanged) Skin: See above  Psychiatric: Appears to have normal behavior and normal affect.  Assessment/Plan: 1. Functional deficits secondary to left temporal hemorrhage which require 3+ hours per day of interdisciplinary therapy in a comprehensive inpatient rehab  setting. Physiatrist is providing close team supervision and 24 hour management of active medical problems listed below. Physiatrist and rehab team continue to assess barriers to discharge/monitor patient progress toward functional and medical goals.  Function:  Bathing Bathing position Bathing activity did not occur: Refused Position: Production manager parts bathed by patient: Right arm, Left arm, Chest, Front perineal area, Abdomen, Right upper leg, Buttocks, Left upper leg, Left lower leg, Right lower leg Body parts bathed by helper: Back  Bathing assist Assist Level: Supervision or verbal cues      Upper Body Dressing/Undressing Upper body dressing   What is the patient wearing?: Pull over shirt/dress     Pull over shirt/dress - Perfomed by patient: Thread/unthread right sleeve, Thread/unthread left sleeve, Put head through opening, Pull shirt over trunk Pull over shirt/dress - Perfomed by helper: Pull shirt over trunk        Upper body assist Assist Level: Supervision or verbal cues      Lower Body Dressing/Undressing Lower body dressing   What is the patient wearing?: Underwear, Pants, Non-skid slipper socks, Ted Hose Underwear - Performed by patient: Thread/unthread right underwear leg, Thread/unthread left underwear leg, Pull underwear up/down Underwear - Performed by helper: Thread/unthread right underwear leg, Thread/unthread left underwear leg Pants- Performed by patient: Thread/unthread right pants leg, Thread/unthread left pants leg, Pull pants up/down Pants- Performed by helper: Thread/unthread right pants leg, Thread/unthread left pants  leg Non-skid slipper socks- Performed by patient: Don/doff right sock, Don/doff left sock Non-skid slipper socks- Performed by helper: Don/doff right sock, Don/doff left sock               TED Hose - Performed by helper: Don/doff right TED hose, Don/doff left TED hose  Lower body assist Assist for lower body  dressing: Set up, Supervision or verbal cues   Set up : Don/doff TED stockings  Toileting Toileting   Toileting steps completed by patient: Adjust clothing prior to toileting, Performs perineal hygiene, Adjust clothing after toileting Toileting steps completed by helper: Performs perineal hygiene Toileting Assistive Devices: Grab bar or rail  Toileting assist Assist level: Supervision or verbal cues   Transfers Chair/bed transfer   Chair/bed transfer method: Ambulatory Chair/bed transfer assist level: Touching or steadying assistance (Pt > 75%) Chair/bed transfer assistive device: Armrests     Locomotion Ambulation     Max distance: 255ft Assist level: Touching or steadying assistance (Pt > 75%)   Wheelchair          Cognition Comprehension Comprehension assist level: Understands basic 75 - 89% of the time/ requires cueing 10 - 24% of the time  Expression Expression assist level: Expresses basic 50 - 74% of the time/requires cueing 25 - 49% of the time. Needs to repeat parts of sentences.  Social Interaction Social Interaction assist level: Interacts appropriately 75 - 89% of the time - Needs redirection for appropriate language or to initiate interaction.  Problem Solving Problem solving assist level: Solves basic 50 - 74% of the time/requires cueing 25 - 49% of the time  Memory Memory assist level: Recognizes or recalls 75 - 89% of the time/requires cueing 10 - 24% of the time    Medical Problem List and Plan: 1.  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  DVT right gastroc and soleal veins: eliquis after review with NS 3. Pain Management: Monitor HA as activity increases. Continue Fioricet.   Intermittent headaches controlled with medications 4. Mood: LCSW to follow for evaluation when appropriate 5. Neuropsych: This patient is not capable of making decisions  on his own behalf. 6. Skin/Wound Care: Monitor incision for healing. Maintain adequate hydration and nutritional status.  7. Fluids/Electrolytes/Nutrition: Monitor I/O.   Advanced to regular texture diet 8. HTN: Monitor BP bid with SBP goal < 140. Norvasc added on 5/9   Controlled on 5/22  Monitor with increased mobility 9. T2DM s/p Whipple for pancreatic cancer: Hgb A1C- 6.8.  Glucotrol 5mg  resumed   Remained labile on 5/22 10. Seizure prophylaxis: Keppra bid d/ced on 5/21 11. Cerebral edema: Hypertonic saline and Cleviprex weaned off 5/12--Sodium has normalized.   12.  Fevers: Resolved   UTI +/- atelectasis +/- DVT  Blood cultures likely contaminent  Urine culture E. Faecalis  CXR reviewed, showing atelectasis. Encourage IS  Amoxicillin 500 TID 5/16-5/22 13. A fib with RVR: Heart rate controlled with increase in Metoprolol.  Amiodarone has been weaned to oral route--monitor HR bid and with activity.   Controlled on 5/22 14. Headaches:  Currently using Fioricet. Will continue to monitor 15. ABLA: Monitor for signs of bleeding.   Hb 9.5 on 5/21  Cont to monitor  16. Hypoalbuminemia   Supplement initiated on 5/14  LOS (Days) 9 A FACE TO FACE EVALUATION WAS PERFORMED  Hector Pugh 04/24/2018 9:03 AM

## 2018-04-25 ENCOUNTER — Inpatient Hospital Stay (HOSPITAL_COMMUNITY): Payer: Managed Care, Other (non HMO)

## 2018-04-25 ENCOUNTER — Inpatient Hospital Stay (HOSPITAL_COMMUNITY): Payer: Managed Care, Other (non HMO) | Admitting: Physical Therapy

## 2018-04-25 ENCOUNTER — Inpatient Hospital Stay (HOSPITAL_COMMUNITY): Payer: Managed Care, Other (non HMO) | Admitting: Speech Pathology

## 2018-04-25 DIAGNOSIS — I824Z1 Acute embolism and thrombosis of unspecified deep veins of right distal lower extremity: Secondary | ICD-10-CM | POA: Insufficient documentation

## 2018-04-25 LAB — GLUCOSE, CAPILLARY
GLUCOSE-CAPILLARY: 96 mg/dL (ref 65–99)
Glucose-Capillary: 163 mg/dL — ABNORMAL HIGH (ref 65–99)
Glucose-Capillary: 135 mg/dL — ABNORMAL HIGH (ref 65–99)
Glucose-Capillary: 208 mg/dL — ABNORMAL HIGH (ref 65–99)

## 2018-04-25 NOTE — Discharge Summary (Signed)
Physician Discharge Summary  Patient ID: Hector Pugh MRN: [id number] DOB/AGE: [birth year] 59 y.o.  Admit date: 04/15/2018 Discharge date: 04/27/2018  Discharge Diagnoses:  Principal Problem:   Left temporal lobe hemorrhage ([place]) Active Problems:   Diabetes mellitus type 2 in nonobese [facility])   Vascular headache   Seizure prophylaxis   Cerebral edema ([facility])   Atrial fibrillation with rapid ventricular response ([facility])   Hypoalbuminemia due to protein-calorie malnutrition ([facility])   Atelectasis   Acute lower UTI   DVT, lower extremity, distal, acute, right ([place])   Discharged Condition: stable   Significant Diagnostic Studies:  Dg Chest 2 View  Result Date: 04/16/2018 CLINICAL DATA:  Fever EXAM: CHEST - 2 VIEW COMPARISON:  04/11/2018 FINDINGS: Left arm PICC has been removed. Hypoventilation with slight bibasilar atelectasis. Negative for heart failure or pneumonia or effusion. IMPRESSION: Hypoventilation with mild bibasilar atelectasis. Electronically Signed   By: Franchot Gallo M.D.   On: 04/16/2018 16:03   [place] Ekg Site Rite  Result Date: 04/06/2018 If Site Rite image not attached, placement could not be confirmed due to current cardiac rhythm.   Labs:  Basic Metabolic Panel: BMP Latest Ref Rng & Units 04/23/2018 04/16/2018 04/15/2018  Glucose 65 - 99 mg/dL 108(H) 182(H) 164(H)  BUN 6 - 20 mg/dL 10 14 15   Creatinine 0.61 - 1.24 mg/dL 0.92 0.97 0.90  Sodium 135 - 145 mmol/L 138 137 138  Potassium 3.5 - 5.1 mmol/L 3.8 3.5 3.7  Chloride 101 - 111 mmol/L 104 104 107  CO2 22 - 32 mmol/L 26 24 23   Calcium 8.9 - 10.3 mg/dL 8.8(L) 8.2(L) 8.2(L)    CBC: CBC Latest Ref Rng & Units 04/23/2018 04/16/2018 04/15/2018  WBC 4.0 - 10.5 K/uL 6.0 8.6 8.8  Hemoglobin 13.0 - 17.0 g/dL 9.5(L) 9.5(L) 9.9(L)  Hematocrit 39.0 - 52.0 % 28.9(L) 28.5(L) 29.9(L)  Platelets 150 - 400 K/uL 399 234 197     CBG: Recent Labs  Lab 04/26/18 0656 04/26/18 1153 04/26/18 1649 04/26/18 2051 04/27/18 0634   GLUCAP 100* 169* 103* 201* 93    Brief HPI:   [name] is a 57 year old male with history of pancreatic cancer, T2DM, HTN who was admitted on 04/05/18 with decrease in LOC, vomiting episode, difficulty speaking and right sided weakness. He was found to have large left temporal hemorrhage with edema and mass effect with shift. He was taken to OR emergently for left craniotomy with evacuation of hemorrhage by Dr. Christella Noa. Post op course significant for fevers due to aspiration PNA, cerebral edema treated with hypertonic saline, A fib with RVR and labile BP.  Serial CT was showing improvement and patient with improvement in mentation as well as activity tolerance. Therapy ongoing and patient limited by right sided weakness with left gaze preference, expressive/receptive aphasia with cognitive deficits, . CIR was recommended due to functional deficits and he was cleared to start his rehab course on 05/12.   Hospital Course: Rhet Rorke was admitted to rehab 04/15/2018 for inpatient therapies to consist of PT, ST and OT at least three hours five days a week. Past admission physiatrist, therapy team and rehab RN have worked together to provide customized collaborative inpatient rehab. He was noted to be febrile on 5/14 am and blood cultures, UCS, LE dopplers as well as CXR were ordered for work up. CXR was negative for infection and 1/2 blood cultures was positive for staph likely due to contaminant. UCS showed >100,000 enterococcus faecalis and he was treated with 7 day  course of amoxicillin.  IS was encouraged to help with atelectasis. BLE dopplers revealed right gastrocnemius and soleal vein DVT and he was cleared to start full anticoagulation by NS. He continues on Eliquis bid and is tolerating this without SE. Follow up labs reveal H/H is stable and Lytes are WNL.  His po intake has been good with family supplementing with meals at home.    Headaches have almost resolved. Heart rate has been  monitored on bid basis and he remains in NSR on metoprolol and amiodarone. Blood pressures have been stable.  Blood sugars were monitored on ac/hs basis and have been controlled overall with intermittent elevations in the evenings. He has completed his course of Keppra and has been seizure free during his stay.  24 hours supervision is recommended due visual deficits with impaired safety awareness as well as receptive aphasia.  He will continue to receive follow up outpatient PT, OT and ST at [facility] after discharge.    Rehab course: During patient's stay in rehab weekly team conferences were held to monitor patient's progress, set goals and discuss barriers to discharge. At admission, patient required mod assist with basic self care needs and with mobility. He exhibited Wernicke's aphasia with apraxic speech and needed max assist to express basic needs. He had improvement in activity tolerance, balance, postural control as well as ability to compensate for deficits. He has had improvement in functional use RUE and RLE as well as improvement in awareness. He is able to complete ADL tasks with supervision and intermittent cues for safety. He is modified independent for transfers and requires supervision to ambulate 200' without AD. He is able to climb one flight of stairs with supervision. He requires mod assist for tasks due to receptive aphasia and apraxic speech. He needs min cues due to problems with safety awareness and right inattention.  Family education was completed regarding all aspects of mobility, cognition, speech and safety.     Disposition: Home  Diet: Diabetic diet.   Special Instructions: 1. No driving or strenuous activity till cleared by MD. 2. Needs 24 hours supervision.    Allergies as of 04/27/2018   No Known Allergies     Medication List    STOP taking these medications   feeding supplement (ENSURE ENLIVE) Liqd   insulin aspart 100 UNIT/ML injection Commonly  known as:  novoLOG   levETIRAcetam 500 MG tablet Commonly known as:  KEPPRA   pneumococcal 23 valent vaccine 25 MCG/0.5ML injection Commonly known as:  PNU-IMMUNE     TAKE these medications   acetaminophen 500 MG tablet Commonly known as:  TYLENOL Take 500 mg by mouth every 4 (four) hours as needed for moderate pain or headache.   amiodarone 200 MG tablet Commonly known as:  PACERONE Take 1 tablet (200 mg total) by mouth daily.   amLODipine 10 MG tablet Commonly known as:  NORVASC Take 1 tablet (10 mg total) by mouth daily.   apixaban 5 MG Tabs tablet Commonly known as:  ELIQUIS Take 1 tablet (5 mg total) by mouth 2 (two) times daily.   butalbital-acetaminophen-caffeine 50-325-40 MG tablet--Rx # 14 pills Commonly known as:  FIORICET, ESGIC Take 1 tablet by mouth every 8 (eight) hours as needed for headache.   glipiZIDE 5 MG 24 hr tablet Commonly known as:  GLUCOTROL XL Take 1 tablet (5 mg total) by mouth daily with breakfast.   levothyroxine 50 MCG tablet Commonly known as:  SYNTHROID, LEVOTHROID Take 1 tablet (50  mcg total) by mouth daily before breakfast.   losartan 50 MG tablet Commonly known as:  COZAAR Take 1 tablet (50 mg total) by mouth 2 (two) times daily.   metoprolol tartrate 100 MG tablet Commonly known as:  LOPRESSOR Take 1 tablet (100 mg total) by mouth 2 (two) times daily.   pantoprazole 40 MG tablet Commonly known as:  PROTONIX Take 1 tablet (40 mg total) by mouth daily.   protein supplement shake Liqd Commonly known as:  PREMIER PROTEIN Take 325 mLs (11 oz total) by mouth 3 (three) times daily with meals.   senna 8.6 MG Tabs tablet Commonly known as:  SENOKOT Take 1 tablet (8.6 mg total) by mouth daily.   tamsulosin 0.4 MG Caps capsule Commonly known as:  FLOMAX Take 1 capsule (0.4 mg total) by mouth daily.      Follow-up Information    Jamse Arn, MD Follow up.   Specialty:  Physical Medicine and Rehabilitation Why:  Office  will call you with follow up appointment Contact information: [address] [place] [address] [phone]        [name], MD. Call on 05/03/2018.   Specialty:  Internal Medicine Why:  1 pm appointment for post hospital follow up/Referral to cardiology due to episode of A fib.  Contact information: [place] [address] [phone]        Ashok Pall, MD. Call in 1 day(s).   Specialty:  Neurosurgery Why:  for post of check in 1-2 weeks Contact information: [address] [place] [address] [place] [address] [phone]        Garvin Fila, MD. Call.   Specialties:  Neurology, Radiology Why:  for follow up appointment in 4 weeks Contact information: [address] [place] [address] [phone]           Signed: Bary Leriche 04/29/2018, 5:47 PM

## 2018-04-25 NOTE — Progress Notes (Signed)
Occupational Therapy Session Note  Patient Details  Name: Hector Pugh MRN: [id number] Date of Birth: 08-22-1961  Today's Date: 04/25/2018 OT Individual Time: 1415-1500 OT Individual Time Calculation (min): 45 min    Short Term Goals: Week 2:  OT Short Term Goal 1 (Week 2): STG = LTGs due to remaining LOS  Skilled Therapeutic Interventions/Progress Updates:    Pt sitting up in w/c agreeable to therapy. Pt completed sit to stand transfer from w/c with poor safety awareness re moving leg rests on w/c, requiring vc for visual attention and correction. (S) only provided during functional mobility into shower where pt transferred to TTB. Pt demo-ing poor safety awareness during LB dressing, continuing to complete standing up with stabilization on wall, despite vc for seated level. Pt completed 200 ft of functional mobility with (S) overall. Pt was set up for kitchen scanning task with items placed in R visual field. Attention to R improving, with only 1 vc required for locating red cup among crowded cabinet on R side. Pt returned to room and was left in w/c with QRB donned and chair alarm set.   Therapy Documentation Precautions:  Precautions Precautions: Fall Precaution Comments: inattentive R side, word finding deficits  Restrictions Weight Bearing Restrictions: No    Vital Signs: Therapy Vitals Temp: 98.3 F (36.8 C) Temp Source: Oral Pulse Rate: 72 BP: 126/83 Patient Position (if appropriate): Sitting Oxygen Therapy SpO2: 100 % O2 Device: Room Air Pain: Pain Assessment Pain Scale: 0-10 Pain Score: 0-No pain  See Function Navigator for Current Functional Status.   Therapy/Group: Individual Therapy  Curtis Sites 04/25/2018, 3:50 PM

## 2018-04-25 NOTE — Patient Care Conference (Signed)
Inpatient RehabilitationTeam Conference and Plan of Care Update Date: 04/24/2018   Time: 2:35 PM    Patient Name: Hector Pugh      Medical Record Number: [id number]  Date of Birth: 26-Feb-1961 Sex: Male         Room/Bed: 4M10C/4M10C-01 Payor Info: Payor: [employer] / Plan: Market researcher / Product Type: *No Product type* /    Admitting Diagnosis: CVA  Admit Date/Time:  04/15/2018  2:30 PM Admission Comments: No comment available   Primary Diagnosis:  Left temporal lobe hemorrhage (HCC) Principal Problem: Left temporal lobe hemorrhage [facility])  Patient Active Problem List   Diagnosis Date Noted  . DVT of axillary vein, acute right ([place]) 04/25/2018  . Acute lower UTI   . Atelectasis   . Hypoalbuminemia due to protein-calorie malnutrition ([place])   . Left temporal lobe hemorrhage ([place]) 04/15/2018  . Vascular headache   . Seizure prophylaxis   . Cerebral edema (HCC)   . Atrial fibrillation with rapid ventricular response ([place])   . Pneumonia   . Diabetes mellitus type 2 in nonobese (HCC)   . Benign essential HTN   . History of pancreatic cancer   . FUO (fever of unknown origin)   . Dysphagia, post-stroke   . Tachycardia   . Tachypnea   . Hypothyroidism   . Hypernatremia   . Hypokalemia   . Acute blood loss anemia   . Thrombocytopenia ([place])   . Intracranial hemorrhage ([place]) 04/05/2018  . Acute pancreatitis 06/11/2014  . Headache 06/11/2014  . 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 Obstructive jaundice 11/11/2012  . Biliary stricture 11/11/2012  . Diabetes mellitus ([place]) 11/10/2012  . HTN (hypertension) 11/10/2012  . Pancreatitis 11/10/2012  . Abdominal pain 11/10/2012    Expected Discharge Date: Expected Discharge Date: 04/27/18  Team Members Present: Physician leading conference: Dr. 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  Improve mobility, transfers, safety, self-care, DM, UTI, DVT  See above   Bowel/Bladder   cont b/b; lbm 5/19  maintain  assess q shift and prn   Swallow/Nutrition/ Hydration   regular textures, thin liquids; intermittent supervision   mod I   toleration of intermittent supervision    ADL's   supervision bathing and dressing with cues for initiation and sequencing, min guard/supervision mobility and transfers without AD, focus on Rt attention during functional mobility  supervision overall  vision, Rt attention, RUE NMR, dynamic balance   Mobility   supervision transfers, min guard to supervision ambulation no AD, R inattention  Supervision assist overall for transfers, gait and bed mobilty   famil ed, R inattention, safety awareness   Communication   mod-max assist   min-mod assist   functional communication, awareness of verbal errors    Safety/Cognition/ Behavioral Observations  mod assist   min assist   continue to address initiation, sequencing, and right attention    Pain   no c/o pain; may c/o HA at times  <4 out of 10  assess q shift and prn; fioricet for HA prn   Skin   incision site to L side of head OTA  free from infection/breakdown with min assist  assess q shift and prn    Rehab Goals Patient on target to meet rehab goals: Yes *See Care Plan and progress notes for long and short-term goals.     Barriers to Discharge  Current Status/Progress Possible Resolutions Date Resolved   Physician    Medical stability     See above  Therapies, optimize DM meds, anticoagulation, abx for UTI      Nursing                  PT                    OT                  SLP                 SW                Discharge Planning/Teaching Needs:  Plan for pt to d/c home with family providing 24/7 assistance.  Teaching ongoing.    Team Discussion:  Cont to monitor DM;  BP controlled;  Fevers resolved and abx completed. Cont b/b and fewer pain complaints.  Still nees cues for initiation and attention with self care.  Significant right inattention continues.  Close supervision to min-guard and approaching supervision goals.  Still need mod assist for cognition but showing some compensation abilities emerging. Need to complete family ed with spouse - SW to follow up.  Revisions to Treatment Plan:  None    Continued Need for Acute Rehabilitation Level of Care: The patient requires daily medical management by a physician with specialized training in physical medicine and rehabilitation for the following conditions: Daily direction of a multidisciplinary physical rehabilitation program to ensure safe treatment while eliciting the highest outcome that is of practical value to the patient.: Yes Daily medical management of patient stability for increased activity during participation in an intensive rehabilitation regime.: Yes Daily analysis of laboratory values and/or radiology reports with any subsequent need for medication adjustment of medical intervention for : Diabetes problems;Other;Neurological problems  Debra Colon, [place] 04/25/2018, 2:23 PM

## 2018-04-25 NOTE — Progress Notes (Signed)
Speech Language Pathology Daily Session Note  Patient Details  Name: Per Beagley MRN: [id number] Date of Birth: [birth year]  Today's Date: 04/25/2018 SLP Individual Time: 1305-1400 SLP Individual Time Calculation (min): 55 min  Short Term Goals: Week 2: SLP Short Term Goal 1 (Week 2): STG=LTG due to remaining length of stay   Skilled Therapeutic Interventions:  Pt was seen for skilled ST targeting communication goals.  Pt was watching news on his tablet to find out the results of the [place] election.  Pt was able to state that "The guy who was already in it won it" (current Financial trader).  Pt was able to match picture to written word from a field of 2 for 100% accuracy with mod I.  He was able to name objects from pictures when given a written model for ~75% accuracy independently, which improved to ~80% accuracy with mod assist and 100% accuracy with max assist verbal and written cues to recognize and correct verbal errors.  Pt was able to copy word from a written model for 100% accuracy with mod I and could write to dictation at the word level with mod assist for accurate spelling.  Pt needed min-mod assist verbal cues for awareness of obstacles to the right of midline.  Pt is making excellent progress towards long term goals but pt will need family education prior to discharge as daughter or wife haven't been present for therapies recently.  CSW made aware.  Pt was left in wheelchair with quick release belt donned and call bell within reach.  Continue per plan of care.     Function:  Eating Eating   Modified Consistency Diet: No             Cognition Comprehension Comprehension assist level: Understands basic 75 - 89% of the time/ requires cueing 10 - 24% of the time  Expression   Expression assist level: Expresses basic 50 - 74% of the time/requires cueing 25 - 49% of the time. Needs to repeat parts of sentences.  Social Interaction Social Interaction assist  level: Interacts appropriately 75 - 89% of the time - Needs redirection for appropriate language or to initiate interaction.  Problem Solving Problem solving assist level: Solves basic 75 - 89% of the time/requires cueing 10 - 24% of the time  Memory Memory assist level: Recognizes or recalls 50 - 74% of the time/requires cueing 25 - 49% of the time    Pain Pain Assessment Pain Scale: 0-10 Pain Score: 0-No pain  Therapy/Group: Individual Therapy  Regis Hinton, [place] 04/25/2018, 3:51 PM

## 2018-04-25 NOTE — Progress Notes (Signed)
Social Work Patient ID: Hector Pugh, male   DOB: Jul 30, 1961, 57 y.o.   MRN: [id number]   Have reviewed team conference with pt and daughter (via phone).  Discussed need for family ed which we have planned for wife to be here at [place] tomorrow morning to begin.  Also discussed follow up services and they are agreeable with plan to do OP f/u which is felt will be more beneficial.  I will also touch base with wife and daughter in a.m.  Ikeya Brockel, LCSW

## 2018-04-25 NOTE — Progress Notes (Signed)
Physical Therapy Session Note  Patient Details  Name: Hector Pugh MRN: [id number] Date of Birth: Aug 15, 1961  Today's Date: 04/25/2018 PT Individual Time: 1100-1200 AND 1600-1630 PT Individual Time Calculation (min): 60 min AND 30 min   Short Term Goals: Week 1:  PT Short Term Goal 1 (Week 1): Pt will ambulate without AD x 150 ft with min assist PT Short Term Goal 1 - Progress (Week 1): Met PT Short Term Goal 2 (Week 1): Pt will ascend/descend 12 steps with B handrails and min assist PT Short Term Goal 2 - Progress (Week 1): Progressing toward goal PT Short Term Goal 3 (Week 1): Pt will attend to R side during 75% of functional mobility tasks PT Short Term Goal 3 - Progress (Week 1): Progressing toward goal  Skilled Therapeutic Interventions/Progress Updates:  Session 1 Pt received sitting in WC and agreeable to PT  PT instructed pt in gait training without AD 233f, 1541f and 5035fith Supervision assist and only min cues for attention to obstacles on the R through CIR.   Pathfinding and forced visual scanning task to locate 1-10 in hall outside rehab gym. Pt noted to only require min cues for visual scanning.   Dynavision visual scanning task. Program A x 3, 4 rings. Supervision assist from PT for mincue sfor improved scanning for attention to lower R quadrant. Pt noted to have significant improvement from previous attempts with a score of 31, 28 and 30. PT attempted to test divided attention with Program A +Tscope, but due to visual deficits, pt unable to read words or numbers.   Gait in simulated community Environment of hospital gift shop and hall of hospital leading to main entrance. Supervision assist from PT for direction only. Pt noted to be able to avoid all obstacles on the R without increased cues. Path finding task to locate 3 items in gift shop. Pt able to remember name of 2/3 objects, but required significant cues for awareness of necklace due to aphasia/agnosia. Stair  management up grand staircase from first to second floor with 1 rail and supervision assist form PT for safety.   Patient returned to room and [facility] and all needs met.     Session 2.   Pt received sitting in WC and agreeable to PT  PT instructed pt in gait training to ad from rehab gym with distant supervision assist from PT for safety.   PT instructed pt in dynamic gait training through obstacle course x2 with supervision assist from PT: weave through 4 cones, step up/down curb step, over 2 obstacles (1in), over unlevel surface x [id number] 12 steps with BUE support.   Dynamic balance training without UE support: reciprocal toe taps on 6 inch step x 10BLE, Lateral foot taps on 6 inch step. Supervision assist overall with min cues for decreased use of UE support. Lateral stepping on 4 inch balance beam, min-mod assist from PT with cues for use of hip strategy  To prevent A/P LOB.   [facility] returned to [facility] and [facility] and all needs met.         Therapy Documentation Precautions:  Precautions Precautions: Fall Precaution Comments: inattentive R side, word finding deficits  Restrictions Weight Bearing Restrictions: No Pain:   denies See Function Navigator for Current Functional Status.   Therapy/Group: Individual Therapy  AustLorie Phenix3/2019, 1:23 PM

## 2018-04-25 NOTE — Progress Notes (Signed)
[place] PHYSICAL MEDICINE & REHABILITATION     PROGRESS NOTE  Subjective/Complaints:  Pt seen sitting up in bed this AM.  He states he slept well overnight.  He has questions about discharge.  He does significantly better when speaking in his native language.   ROS: Denies CP, S OB, nausea, vomiting, diarrhea.  Objective: Vital Signs: Blood pressure 111/77, pulse 69, temperature 98 F (36.7 C), temperature source Oral, resp. rate 18, height 5\' 6"  (1.676 m), weight 68.3 kg (150 lb 9.2 oz), SpO2 99 %. No results found. Recent Labs    04/23/18 0612  WBC 6.0  HGB 9.5*  HCT 28.9*  PLT 399   Recent Labs    04/23/18 0612  NA 138  K 3.8  CL 104  GLUCOSE 108*  BUN 10  CREATININE 0.92  CALCIUM 8.8*   CBG (last 3)  Recent Labs    04/24/18 1618 04/24/18 2126 04/25/18 0642  GLUCAP 124* 192* 96    Wt Readings from Last 3 Encounters:  04/25/18 68.3 kg (150 lb 9.2 oz)  04/05/18 72.5 kg (159 lb 13.3 oz)  04/04/18 71.7 kg (158 lb)    Physical Exam:  BP 111/77 (BP Location: Left Arm)   Pulse 69   Temp 98 F (36.7 C) (Oral)   Resp 18   Ht 5\' 6"  (1.676 m)   Wt 68.3 kg (150 lb 9.2 oz)   SpO2 99%   BMI 24.30 kg/m  Constitutional: No distress . Vital signs reviewed. HENT: Normocephalic.  Craniotomy incision C/D/I Eyes: EOMI. No discharge. Cardiovascular: RRR. No JVD. Respiratory: CTA Bilaterally. Normal effort. GI: BS +. Non-distended. Musc: Improving RUE/RLE edema. Neurological: He is alert.  Right inattention Right facial weakness with minimal dysarthria. Fluent aphasia with apraxia, continues to improve Motor: LUE/LLE: 5/5 proximal to distal RUE: 4/5 proximal to distal (unchanged) RLE: 4+/5 proximal to distal (unchanged) Skin: See above  Psychiatric: Appears to have normal behavior and normal affect.  Assessment/Plan: 1. Functional deficits secondary to left temporal hemorrhage which require 3+ hours per day of interdisciplinary therapy in a comprehensive  inpatient rehab setting. Physiatrist is providing close team supervision and 24 hour management of active medical problems listed below. Physiatrist and rehab team continue to assess barriers to discharge/monitor patient progress toward functional and medical goals.  Function:  Bathing Bathing position Bathing activity did not occur: Refused Position: Production manager parts bathed by patient: Right arm, Left arm, Chest, Front perineal area, Abdomen, Right upper leg, Buttocks, Left upper leg, Left lower leg, Right lower leg Body parts bathed by helper: Back  Bathing assist Assist Level: Supervision or verbal cues      Upper Body Dressing/Undressing Upper body dressing   What is the patient wearing?: Pull over shirt/dress     Pull over shirt/dress - Perfomed by patient: Thread/unthread right sleeve, Thread/unthread left sleeve, Put head through opening, Pull shirt over trunk Pull over shirt/dress - Perfomed by helper: Pull shirt over trunk        Upper body assist Assist Level: Supervision or verbal cues      Lower Body Dressing/Undressing Lower body dressing   What is the patient wearing?: Underwear, Pants, [employer], Shoes Underwear - Performed by patient: Thread/unthread right underwear leg, Thread/unthread left underwear leg, Pull underwear up/down Underwear - Performed by helper: Thread/unthread right underwear leg, Thread/unthread left underwear leg Pants- Performed by patient: Thread/unthread right pants leg, Thread/unthread left pants leg, Pull pants up/down Pants- Performed by helper: Thread/unthread right  pants leg, Thread/unthread left pants leg Non-skid slipper socks- Performed by patient: Don/doff right sock, Don/doff left sock Non-skid slipper socks- Performed by helper: Don/doff right sock, Don/doff left sock     Shoes - Performed by patient: Don/doff right shoe, Don/doff left shoe, Fasten right, Fasten left         TED Hose - Performed by helper:  Don/doff right TED hose, Don/doff left TED hose  Lower body assist Assist for lower body dressing: Set up, Supervision or verbal cues   Set up : Don/doff TED stockings  Toileting Toileting   Toileting steps completed by patient: Adjust clothing prior to toileting, Performs perineal hygiene, Adjust clothing after toileting Toileting steps completed by helper: Performs perineal hygiene Toileting Assistive Devices: Grab bar or rail  Toileting assist Assist level: Supervision or verbal cues   Transfers Chair/bed transfer   Chair/bed transfer method: Ambulatory Chair/bed transfer assist level: Touching or steadying assistance (Pt > 75%) Chair/bed transfer assistive device: Armrests     Locomotion Ambulation     Max distance: 258ft Assist level: Touching or steadying assistance (Pt > 75%)   Wheelchair          Cognition Comprehension Comprehension assist level: Understands basic 50 - 74% of the time/ requires cueing 25 - 49% of the time  Expression Expression assist level: Expresses basic 50 - 74% of the time/requires cueing 25 - 49% of the time. Needs to repeat parts of sentences.  Social Interaction Social Interaction assist level: Interacts appropriately 75 - 89% of the time - Needs redirection for appropriate language or to initiate interaction.  Problem Solving Problem solving assist level: Solves basic 50 - 74% of the time/requires cueing 25 - 49% of the time  Memory Memory assist level: Recognizes or recalls 25 - 49% of the time/requires cueing 50 - 75% of the time    Medical Problem List and Plan: 1.  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  DVT right gastroc and soleal veins: eliquis after review with NS 3. Pain Management: Monitor HA as activity increases. Continue Fioricet.   Intermittent headaches controlled with medications, overall improving  4. Mood:  LCSW to follow for evaluation when appropriate 5. Neuropsych: This patient is not capable of making decisions on his own behalf. 6. Skin/Wound Care: Monitor incision for healing. Maintain adequate hydration and nutritional status.  7. Fluids/Electrolytes/Nutrition: Monitor I/O.   Advanced to regular texture diet 8. HTN: Monitor BP bid with SBP goal < 140. Norvasc added on 5/9   Controlled on 5/23  Monitor with increased mobility 9. T2DM s/p [place]ipple for pancreatic cancer: Hgb A1C- 6.8.  Glucotrol 5mg  resumed   Remained labile on 5/23 10. Seizure prophylaxis: Keppra bid d/ced on 5/21 11. Cerebral edema: Hypertonic saline and Cleviprex weaned off 5/12--Sodium has normalized.   12.  Fevers: Resolved   UTI +/- atelectasis +/- DVT  Blood cultures likely contaminent  Urine culture E. Faecalis  CXR reviewed, showing atelectasis. Encourage IS  Amoxicillin 500 TID 5/16-5/22, completed 13. A fib with RVR: Heart rate controlled with increase in Metoprolol.  Amiodarone has been weaned to oral route--monitor HR bid and with activity.   Controlled on 5/23 14. Headaches:  Currently using Fioricet. Will continue to monitor 15. ABLA: Monitor for signs of bleeding.   Hb 9.5 on 5/21  Cont to monitor  16. Hypoalbuminemia   Supplement initiated on 5/14  LOS (Days) 10  A FACE TO FACE EVALUATION WAS PERFORMED  Ankit Tabb [place] 04/25/2018 10:45 AM

## 2018-04-26 ENCOUNTER — Encounter (HOSPITAL_COMMUNITY): Payer: Managed Care, Other (non HMO) | Admitting: Speech Pathology

## 2018-04-26 ENCOUNTER — Inpatient Hospital Stay (HOSPITAL_COMMUNITY): Payer: Managed Care, Other (non HMO) | Admitting: Occupational Therapy

## 2018-04-26 ENCOUNTER — Ambulatory Visit (HOSPITAL_COMMUNITY): Payer: Managed Care, Other (non HMO) | Admitting: Physical Therapy

## 2018-04-26 ENCOUNTER — Encounter (HOSPITAL_COMMUNITY): Payer: Self-pay

## 2018-04-26 ENCOUNTER — Inpatient Hospital Stay (HOSPITAL_COMMUNITY): Payer: Managed Care, Other (non HMO) | Admitting: Physical Therapy

## 2018-04-26 DIAGNOSIS — I82A11 Acute embolism and thrombosis of right axillary vein: Secondary | ICD-10-CM

## 2018-04-26 LAB — GLUCOSE, CAPILLARY
GLUCOSE-CAPILLARY: 100 mg/dL — AB (ref 65–99)
Glucose-Capillary: 169 mg/dL — ABNORMAL HIGH (ref 65–99)
Glucose-Capillary: 103 mg/dL — ABNORMAL HIGH (ref 65–99)
Glucose-Capillary: 201 mg/dL — ABNORMAL HIGH (ref 65–99)

## 2018-04-26 MED ORDER — PANTOPRAZOLE SODIUM 40 MG PO TBEC: 40.0000 mg | DELAYED_RELEASE_TABLET | Freq: Every day | ORAL | 0 refills | Status: DC

## 2018-04-26 MED ORDER — BUTALBITAL-APAP-CAFFEINE 50-325-40 MG PO TABS: 1.0000 | ORAL_TABLET | Freq: Three times a day (TID) | ORAL | 0 refills | Status: DC | PRN

## 2018-04-26 MED ORDER — LEVOTHYROXINE SODIUM 50 MCG PO TABS: 50.0000 ug | ORAL_TABLET | Freq: Every day | ORAL | 0 refills | Status: AC

## 2018-04-26 MED ORDER — PREMIER PROTEIN SHAKE: 11.0000 [oz_av] | Freq: Three times a day (TID) | ORAL | 0 refills | Status: AC

## 2018-04-26 MED ORDER — AMLODIPINE BESYLATE 10 MG PO TABS: 10.0000 mg | ORAL_TABLET | Freq: Every day | ORAL | 0 refills | Status: DC

## 2018-04-26 MED ORDER — TAMSULOSIN HCL 0.4 MG PO CAPS: 0.4000 mg | ORAL_CAPSULE | Freq: Every day | ORAL | 0 refills | Status: AC

## 2018-04-26 MED ORDER — GLIPIZIDE ER 5 MG PO TB24: 5.0000 mg | ORAL_TABLET | Freq: Every day | ORAL | 0 refills | Status: DC

## 2018-04-26 MED ORDER — APIXABAN 5 MG PO TABS: 5.0000 mg | ORAL_TABLET | Freq: Two times a day (BID) | ORAL | 0 refills | Status: DC

## 2018-04-26 MED ORDER — SENNA 8.6 MG PO TABS: 1.0000 | ORAL_TABLET | Freq: Every day | ORAL | 0 refills | Status: AC

## 2018-04-26 MED ORDER — AMIODARONE HCL 200 MG PO TABS: 200.0000 mg | ORAL_TABLET | Freq: Every day | ORAL | 0 refills | Status: DC

## 2018-04-26 MED ORDER — LOSARTAN POTASSIUM 50 MG PO TABS: 50.0000 mg | ORAL_TABLET | Freq: Two times a day (BID) | ORAL | 0 refills | Status: DC

## 2018-04-26 MED ORDER — METOPROLOL TARTRATE 100 MG PO TABS: 100.0000 mg | ORAL_TABLET | Freq: Two times a day (BID) | ORAL | 0 refills | Status: DC

## 2018-04-26 NOTE — Progress Notes (Signed)
[place] PHYSICAL MEDICINE & REHABILITATION     PROGRESS NOTE  Subjective/Complaints:  Patient seen sitting up in his chair this morning. Family at bedside. Patient states he slept well overnight. He is looking forward to going home tomorrow. Patient and family with questions regarding discharge process and follow-up care.  ROS: denies CP, S OB, nausea, vomiting, diarrhea.  Objective: Vital Signs: Blood pressure 115/72, pulse 69, temperature 98.8 F (37.1 C), temperature source Oral, resp. rate 20, height 5\' 6"  (1.676 m), weight 68.4 kg (150 lb 12.7 oz), SpO2 99 %. No results found. No results for input(s): WBC, HGB, HCT, PLT in the last 72 hours. No results for input(s): NA, K, CL, GLUCOSE, BUN, CREATININE, CALCIUM in the last 72 hours.  Invalid input(s): CO CBG (last 3)  Recent Labs    04/25/18 1652 04/25/18 2105 04/26/18 0656  GLUCAP 135* 208* 100*    Wt Readings from Last 3 Encounters:  04/26/18 68.4 kg (150 lb 12.7 oz)  04/05/18 72.5 kg (159 lb 13.3 oz)  04/04/18 71.7 kg (158 lb)    Physical Exam:  BP 115/72 (BP Location: Left Arm)   Pulse 69   Temp 98.8 F (37.1 C) (Oral)   Resp 20   Ht 5\' 6"  (1.676 m)   Wt 68.4 kg (150 lb 12.7 oz)   SpO2 99%   BMI 24.34 kg/m  Constitutional: No distress . Vital signs reviewed. HENT: Normocephalic.  Craniotomy incision C/D/I Eyes: EOMI. No discharge. Cardiovascular: RRR. No JVD. Respiratory: CTA Bilaterally. Normal effort. GI: BS +. Non-distended. Musc: Improving RUE/RLE edema. Neurological: He is alert.  Right inattention Right facial weakness with minimal dysarthria. Fluent aphasia with apraxia, continues to improve Motor: LUE/LLE: 5/5 proximal to distal RUE: 4/5 proximal to distal (improving) RLE: 4+/5 proximal to distal (stable) Skin: See above  Psychiatric: Appears to have normal behavior and normal affect.  Assessment/Plan: 1. Functional deficits secondary to left temporal hemorrhage which require 3+ hours  per day of interdisciplinary therapy in a comprehensive inpatient rehab setting. Physiatrist is providing close team supervision and 24 hour management of active medical problems listed below. Physiatrist and rehab team continue to assess barriers to discharge/monitor patient progress toward functional and medical goals.  Function:  Bathing Bathing position Bathing activity did not occur: Refused Position: Production manager parts bathed by patient: Right arm, Left arm, Chest, Front perineal area, Abdomen, Right upper leg, Buttocks, Left upper leg, Left lower leg, Right lower leg, Back Body parts bathed by helper: Back  Bathing assist Assist Level: Supervision or verbal cues      Upper Body Dressing/Undressing Upper body dressing   What is the patient wearing?: Pull over shirt/dress     Pull over shirt/dress - Perfomed by patient: Thread/unthread right sleeve, Thread/unthread left sleeve, Put head through opening, Pull shirt over trunk Pull over shirt/dress - Perfomed by helper: Pull shirt over trunk        Upper body assist Assist Level: Supervision or verbal cues      Lower Body Dressing/Undressing Lower body dressing   What is the patient wearing?: Underwear, Pants, Non-skid slipper socks, Shoes Underwear - Performed by patient: Thread/unthread right underwear leg, Thread/unthread left underwear leg, Pull underwear up/down Underwear - Performed by helper: Thread/unthread right underwear leg, Thread/unthread left underwear leg Pants- Performed by patient: Thread/unthread right pants leg, Thread/unthread left pants leg, Pull pants up/down Pants- Performed by helper: Thread/unthread right pants leg, Thread/unthread left pants leg Non-skid slipper socks- Performed by patient:  Don/doff right sock, Don/doff left sock Non-skid slipper socks- Performed by helper: Don/doff right sock, Don/doff left sock     Shoes - Performed by patient: Don/doff right shoe, Don/doff left shoe,  Fasten right, Fasten left         TED Hose - Performed by helper: Don/doff right TED hose, Don/doff left TED hose  Lower body assist Assist for lower body dressing: Set up, Supervision or verbal cues   Set up : Don/doff TED stockings  Toileting Toileting   Toileting steps completed by patient: Adjust clothing prior to toileting, Performs perineal hygiene, Adjust clothing after toileting Toileting steps completed by helper: Performs perineal hygiene Toileting Assistive Devices: Grab bar or rail  Toileting assist Assist level: Supervision or verbal cues   Transfers Chair/bed transfer   Chair/bed transfer method: Ambulatory Chair/bed transfer assist level: Set up only Chair/bed transfer assistive device: Armrests     Locomotion Ambulation     Max distance: 200 ft Assist level: Supervision or verbal cues   Wheelchair          Cognition Comprehension Comprehension assist level: Understands basic 75 - 89% of the time/ requires cueing 10 - 24% of the time  Expression Expression assist level: Expresses basic 50 - 74% of the time/requires cueing 25 - 49% of the time. Needs to repeat parts of sentences.  Social Interaction Social Interaction assist level: Interacts appropriately 50 - 74% of the time - May be physically or verbally inappropriate.  Problem Solving Problem solving assist level: Solves basic 50 - 74% of the time/requires cueing 25 - 49% of the time  Memory Memory assist level: Recognizes or recalls 50 - 74% of the time/requires cueing 25 - 49% of the time    Medical Problem List and Plan: 1.  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  DVT right gastroc and soleal veins: eliquis after review with NS 3. Pain Management: Monitor HA as activity increases. Continue Fioricet.    Intermittent headaches controlled with medications, overall improving  4. Mood: LCSW to follow for evaluation when appropriate 5. Neuropsych: This patient is not capable of making decisions on his own behalf. 6. Skin/Wound Care: Monitor incision for healing. Maintain adequate hydration and nutritional status.  7. Fluids/Electrolytes/Nutrition: Monitor I/O.   Advanced to regular texture diet 8. HTN: Monitor BP bid with SBP goal < 140. Norvasc added on 5/9   Controlled on 5/24  Monitor with increased mobility 9. T2DM s/p Whipple for pancreatic cancer: Hgb A1C- 6.8.  Glucotrol 5mg  resumed   Labile on 5/24  Will need ambulatory monitoring 10. Seizure prophylaxis: Keppra bid d/ced on 5/21 11. Cerebral edema: Hypertonic saline and Cleviprex weaned off 5/12--Sodium has normalized.   12.  Fevers: Resolved   UTI +/- atelectasis +/- DVT  Blood cultures likely contaminent  Urine culture E. Faecalis  CXR reviewed, showing atelectasis. Encourage IS  Amoxicillin 500 TID 5/16-5/22, completed 13. A fib with RVR: Heart rate controlled with increase in Metoprolol.  Amiodarone has been weaned to oral route--monitor HR bid and with activity.   Controlled on 5/24 14. Headaches:  Currently using Fioricet. Will continue to monitor 15. ABLA: Monitor for signs of bleeding.   Hb 9.5 on 5/21  Cont to monitor  16. Hypoalbuminemia   Supplement initiated on 5/14  LOS (Days) 11 A FACE  TO FACE EVALUATION WAS PERFORMED  Hector Pugh 04/26/2018 10:07 AM

## 2018-04-26 NOTE — Progress Notes (Signed)
Physical Therapy Discharge Summary  Patient Details  Name: Hector Pugh MRN: [id number] Date of Birth: Apr 29, 1961  Today's Date: 04/26/2018 PT Individual Time: 1000-1100 AND 1600-1630 PT Individual Time Calculation (min): 60 min AND 30 min   2 Patient has met 8 of 8 long term goals due to improved activity tolerance, improved balance, improved postural control, increased strength, increased range of motion, decreased pain, ability to compensate for deficits, functional use of  right upper extremity and right lower extremity, improved attention, improved awareness and improved coordination.  Patient to discharge at an ambulatory level Supervision.   Patient's care partner is independent to provide the necessary physical and cognitive assistance at discharge.  Reasons goals not met: All PT goals met   Recommendation:  Patient will benefit from ongoing skilled PT services in outpatient setting to continue to advance safe functional mobility, address ongoing impairments in balance, safety, strength, visual deficits. , and minimize fall risk.  Equipment: No equipment provided  Reasons for discharge: treatment goals met and discharge from hospital  Patient/family agrees with progress made and goals achieved: Yes   PT treatment  Pt received sitting in WC and agreeable to PT. PT instructed pt in Grad day assessment to measure progress toward goals. See below for details. PT instructed pt in family education with emphasis on awareness of obstacles on the R side in functional context. Gait training through hall and hospital gift shop with supervision assist from family and PT. Education for car transfer also completed Patient returned to room and left sitting in [facility] with call bell in reach and all needs met.    Session 2.   PT instructed pt in modified [place] balance HEP level A and B, min verbal cues for safety concerns and proper ROM and technique to improve effectiveness of exercises. . Patient  returned to room and left sitting in [facility] with call bell in reach and all needs met.      PT Discharge Pain   0/10  Vision/Perception  Perception Perception: Impaired Inattention/Neglect: Does not attend to right visual field Praxis Praxis: Intact  Cognition   Sensation Sensation Light Touch: Appears Intact Proprioception: Appears Intact Coordination Gross Motor Movements are Fluid and Coordinated: Yes Fine Motor Movements are Fluid and Coordinated: Yes Coordination and Movement Description: WFL R vs L greatly improved from Eval   Motor  Motor Motor: Hemiplegia Motor - Discharge Observations: mild R hemiparesis. significantly improved from Eval   Mobility Bed Mobility Bed Mobility: Rolling Right;Rolling Left;Supine to Sit;Sit to Supine Rolling Right: 6: Modified independent (Device/Increase time) Rolling Left: 6: Modified independent (Device/Increase time) Supine to Sit: 6: Modified independent (Device/Increase time) Sit to Supine: 6: Modified independent (Device/Increase time) Transfers Transfers: Yes Sit to Stand: 6: Modified independent (Device/Increase time) Stand Pivot Transfers: 6: Modified independent (Device/Increase time) Locomotion  Ambulation Ambulation: Yes Ambulation/Gait Assistance: 5: Supervision Assistive device: None Gait Gait: Yes Gait Pattern: Within Functional Limits Stairs / Additional Locomotion Stairs: Yes Stairs Assistance: 5: Supervision Stair Management Technique: One rail Right Height of Stairs: 6 Curb: 5: Building services engineer Mobility: No  Trunk/Postural Assessment    WFL Balance Balance Balance Assessed: Yes Standardized Balance Assessment Standardized Balance Assessment: Berg Balance Test Berg Balance Test Sit to Stand: Able to stand without using hands and stabilize independently Standing Unsupported: Able to stand safely 2 minutes Sitting with Back Unsupported but Feet Supported on Floor or Stool: Able  to sit safely and securely 2 minutes Stand to Sit: Sits safely with minimal  use of hands Transfers: Able to transfer safely, minor use of hands Standing Unsupported with Eyes Closed: Able to stand 10 seconds safely Standing Ubsupported with Feet Together: Able to place feet together independently and stand for 1 minute with supervision From Standing, Reach Forward with Outstretched Arm: Can reach confidently >25 cm (10") From Standing Position, Pick up Object from Floor: Able to pick up shoe safely and easily From Standing Position, Turn to Look Behind Over each Shoulder: Looks behind from both sides and weight shifts well Turn 360 Degrees: Able to turn 360 degrees safely in 4 seconds or less Standing Unsupported, Alternately Place Feet on Step/Stool: Able to stand independently and safely and complete 8 steps in 20 seconds Standing Unsupported, One Foot in Front: Able to plae foot ahead of the other independently and hold 30 seconds Standing on One Leg: Able to lift leg independently and hold equal to or more than 3 seconds Total Score: 52 Static Sitting Balance Static Sitting - Level of Assistance: 6: Modified independent (Device/Increase time) Dynamic Sitting Balance Dynamic Sitting - Level of Assistance: 6: Modified independent (Device/Increase time) Static Standing Balance Static Standing - Level of Assistance: 6: Modified independent (Device/Increase time) Dynamic Standing Balance Dynamic Standing - Level of Assistance: 5: Stand by assistance Extremity Assessment      RLE Assessment RLE Assessment: Within Functional Limits RLE Strength Right Hip Flexion: 4+/5 Right Hip Extension: 4+/5 Right Knee Flexion: 4+/5 Right Knee Extension: 5/5 Right Ankle Dorsiflexion: 4/5 Right Ankle Plantar Flexion: 4+/5 LLE Assessment LLE Assessment: Within Functional Limits   See Function Navigator for Current Functional Status.  Lorie Phenix 04/26/2018, 5:41 PM

## 2018-04-26 NOTE — Progress Notes (Signed)
Occupational Therapy Discharge Summary  Patient Details  Name: Hector Pugh MRN: [id number] Date of Birth: 04/24/1961  Today's Date: 04/26/2018 OT Individual Time: 1120-1200 OT Individual Time Calculation (min): 40 min   Pt completed BADL tasks with overall supervision and intermittent cuing for safety awareness. Discussed home set-up, DME needs, and safe BADL participation within home environment. See functional navigator for further details.   Patient has met 10 of 10 long term goals due to improved activity tolerance, improved balance, postural control, ability to compensate for deficits, functional use of  RIGHT upper and RIGHT lower extremity, improved attention, improved awareness and improved coordination.  Patient to discharge at overall Supervision level.  Patient's care partner is independent to provide the necessary assistance at discharge.    Reasons goals not met: n/a  Recommendation:  Patient will benefit from ongoing skilled OT services in home health setting to continue to advance functional skills in the area of BADL.  Equipment: shower seat  Reasons for discharge: treatment goals met and discharge from hospital  Patient/family agrees with progress made and goals achieved: Yes  OT Discharge Precautions/Restrictions  Precautions Precautions: Fall Restrictions Weight Bearing Restrictions: No Pain Pain Assessment Pain Scale: 0-10 Pain Score: 0-No pain ADL ADL ADL Comments: Please see functional navigator Perception  Perception: Impaired Inattention/Neglect: s not attend to right visual field(improved since eval) Cognition Overall Cognitive Status: Impaired/Different from baseline Arousal/Alertness: Awake/alert Orientation Level: Oriented X4 Attention: Sustained Sustained Attention: Appears intact Memory: Appears intact Awareness: Impaired Awareness Impairment: Emergent impairment Problem Solving: Impaired Problem Solving Impairment: Functional  basic;Verbal basic Executive Function: Self Monitoring;Self Correcting Self Monitoring: Impaired Self Monitoring Impairment: Verbal basic Self Correcting: Impaired Self Correcting Impairment: Verbal basic Behaviors: Perseveration;Impulsive Safety/Judgment: Impaired Comments: right inattention  Sensation Sensation Light Touch: Appears Intact Proprioception: Appears Intact Coordination Gross Motor Movements are Fluid and Coordinated: Yes Fine Motor Movements are Fluid and Coordinated: Yes Coordination and Movement Description: WFL al   Finger Nose Finger Test: WFL  Heel Shin Test: impaired R LE Motor  Motor Motor: Hemiplegia Motor - Skilled Clinical Observations: R hemiparesis Motor - Discharge Observations: mild R hemiparesis. significantly improved from Eval  Mobility  Bed Mobility Supine to Sit: 6: Modified independent (Device/Increase time) Sit to Supine: 6: Modified independent (Device/Increase time) Transfers Sit to Stand: 6: Modified independent (Device/Increase time)  Balance Balance Static Sitting Balance Static Sitting - Level of Assistance: 6: Modified independent (Device/Increase time) Dynamic Sitting Balance Dynamic Sitting - Level of Assistance: 6: Modified independent (Device/Increase time) Static Standing Balance Static Standing - Level of Assistance: 6: Modified independent (Device/Increase time) Dynamic Standing Balance Dynamic Standing - Level of Assistance: 5: Stand by assistance Extremity/Trunk Assessment RUE Assessment RUE Assessment: Within Functional Limits(grossly 4+/5) LUE Assessment LUE Assessment: Within Functional Limits   See Function Navigator for Current Functional Status.   S  04/26/2018, 12:30 PM  

## 2018-04-26 NOTE — Progress Notes (Signed)
Speech Language Pathology Discharge Summary  Patient Details  Name: Hector Pugh MRN: [id number] Date of Birth: 1961-01-12  Today's Date: 04/26/2018 SLP Individual Time: 0800-0900 SLP Individual Time Calculation (min): 60 min   Skilled Therapeutic Interventions:  Pt was seen for skilled ST targeting family education and grad day activities to measure progress from initial evaluation.  Pt's wife and daughter were present at bedside and remained actively engaged throughout training.  SLP discussed pt's current goals and progress in therapy.  Skilled education was provided regarding techniques to maximize pt's functional communication.  Handout was provided to maximize carryover in the home environment.  SLP discussed rationale for 24/7 supervision at discharge, specifically pertaining to pt's right inattention.  SLP demonstrated cuing techniques during structured tasks.  Pt was able to match written word to object from a field of two for 100% accuracy with mod I but needed mod assist verbal and written cues to recognize and correct verbal errors when naming objects.  All questions were answered to pt's and family's satisfaction at this time.  Pt is ready for discharge tomorrow.        Patient has met 7 of 7 long term goals.  Patient to discharge at [facility] level.  Reasons goals not met:     Clinical Impression/Discharge Summary:   Pt has made functional gains while inpatient and is discharging having met 7 out of 7 long term goals.  Pt is currently mod assist for tasks due to receptive aphasia and apraxia of speech and is consuming regular, thin liquids diet with mod I use of swallowing precautions.  Pt also needs min cues for safety during ambulation due to right inattention and decreased safety awareness.  Pt and family education is complete at this time.  Pt has demonstrated improved awareness and ability to correct verbal errors.  Pt is discharging home with recommendations for ST follow up  at next level of care for cognitve-communicative function.     Care Partner:  Caregiver Able to Provide Assistance: Yes  Type of Caregiver Assistance: Physical;Cognitive  Recommendation:  Outpatient SLP;24 hour supervision/assistance  Rationale for SLP Follow Up: Maximize functional communication;Maximize cognitive function and independence;Reduce caregiver burden   Equipment: none recommended by SLP    Reasons for discharge: Discharged from hospital   Patient/Family Agrees with Progress Made and Goals Achieved: Yes   Function:  Eating Eating               Cognition Comprehension Comprehension assist level: Understands basic 75 - 89% of the time/ requires cueing 10 - 24% of the time  Expression   Expression assist level: Expresses basic 50 - 74% of the time/requires cueing 25 - 49% of the time. Needs to repeat parts of sentences.  Social Interaction Social Interaction assist level: Interacts appropriately 75 - 89% of the time - Needs redirection for appropriate language or to initiate interaction.  Problem Solving Problem solving assist level: Solves basic 75 - 89% of the time/requires cueing 10 - 24% of the time  Memory Memory assist level: Recognizes or recalls 50 - 74% of the time/requires cueing 25 - 49% of the time   Emilio Math 04/26/2018, 1:24 PM

## 2018-04-27 LAB — GLUCOSE, CAPILLARY: Glucose-Capillary: 93 mg/dL (ref 65–99)

## 2018-04-27 NOTE — Progress Notes (Signed)
Patient discharged home with son about 1030. Patient and son given discharge instructions. All questions answered. Patient's vitals stable. Patient denied any pain.

## 2018-04-27 NOTE — Progress Notes (Signed)
[facility]ILIT[facility]     PROGRESS NOTE  Subjective/Complaints:   No issues overnite  ROS: denies CP, S OB, nausea, vomiting, diarrhea.  Objective: Vital Signs: Blood pressure 105/74, pulse 70, temperature 97.9 F (36.6 C), temperature source Oral, resp. rate 18, height 5\' 6"  (1.676 m), weight 68.3 kg (150 lb 9.2 oz), SpO2 99 %. No results found. No results for input(s): WBC, HGB, HCT, PLT in the last 72 hours. No results for input(s): NA, K, CL, GLUCOSE, BUN, CREATININE, CALCIUM in the last 72 hours.  Invalid input(s): CO CBG (last 3)  Recent Labs    04/26/18 1649 04/26/18 2051 04/27/18 0634  GLUCAP 103* 201* 93    Wt Readings from Last 3 Encounters:  04/27/18 68.3 kg (150 lb 9.2 oz)  04/05/18 72.5 kg (159 lb 13.3 oz)  04/04/18 71.7 kg (158 lb)    Physical Exam:  BP 105/74 (BP Location: Right Arm)   Pulse 70   Temp 97.9 F (36.6 C) (Oral)   Resp 18   Ht 5\' 6"  (1.676 m)   Wt 68.3 kg (150 lb 9.2 oz)   SpO2 99%   BMI 24.30 kg/m  Constitutional: No distress . Vital signs reviewed. HENT: Normocephalic.  Craniotomy incision C/D/I Eyes: EOMI. No discharge. Cardiovascular: RRR. No JVD. Respiratory: CTA Bilaterally. Normal effort. GI: BS +. Non-distended. Musc: Improving RUE/RLE edema. Neurological: He is alert.  Right inattention Right facial weakness with minimal dysarthria. Fluent aphasia with apraxia, continues to improve Motor: LUE/LLE: 5/5 proximal to distal RUE: 4/5 proximal to distal (improving) RLE: 4+/5 proximal to distal (stable) Skin: See above  Psychiatric: Appears to have normal behavior and normal affect.  Assessment/Plan: 1. Functional deficits secondary to left temporal hemorrhage  Stable for D/C today F/u PCP in 3-4 weeks F/u PM&R 2 weeks See D/C summary See D/C instructions Function:  Bathing Bathing position Bathing activity did not occur: Refused Position: Shower  Bathing parts Body parts bathed by patient:  Right arm, Chest, Left arm, Front perineal area, Abdomen, Buttocks, Right upper leg, Right lower leg, Left lower leg, Left upper leg Body parts bathed by helper: Back  Bathing assist Assist Level: Supervision or verbal cues      Upper Body Dressing/Undressing Upper body dressing   What is the patient wearing?: Pull over shirt/dress     Pull over shirt/dress - Perfomed by patient: Thread/unthread right sleeve, Thread/unthread left sleeve, Put head through opening, Pull shirt over trunk Pull over shirt/dress - Perfomed by helper: Pull shirt over trunk        Upper body assist Assist Level: No help, No cues      Lower Body Dressing/Undressing Lower body dressing   What is the patient wearing?: Pants, Underwear, Socks, Shoes Underwear - Performed by patient: Thread/unthread right underwear leg, Pull underwear up/down, Thread/unthread left underwear leg Underwear - Performed by helper: Thread/unthread right underwear leg, Thread/unthread left underwear leg Pants- Performed by patient: Thread/unthread left pants leg, Thread/unthread right pants leg, Pull pants up/down Pants- Performed by helper: Thread/unthread right pants leg, Thread/unthread left pants leg Non-skid slipper socks- Performed by patient: Don/doff left sock, Don/doff right sock Non-skid slipper socks- Performed by helper: Don/doff right sock, Don/doff left sock     Shoes - Performed by patient: Don/doff left shoe, Don/doff right shoe, Fasten right, Fasten left         TED Hose - Performed by helper: Don/doff right TED hose, Don/doff left TED hose  Lower body assist Assist for  lower body dressing: Supervision or verbal cues   Set up : Don/doff TED stockings  Toileting Toileting   Toileting steps completed by patient: Adjust clothing prior to toileting, Performs perineal hygiene, Adjust clothing after toileting Toileting steps completed by helper: Performs perineal hygiene Toileting Assistive Devices: Grab bar or rail   Toileting assist Assist level: Supervision or verbal cues   Transfers Chair/bed transfer   Chair/bed transfer method: Stand pivot Chair/bed transfer assist level: No Help, no cues, assistive device, takes more than a reasonable amount of time Chair/bed transfer assistive device: Armrests     Locomotion Ambulation     Max distance: 520ft  Assist level: Supervision or verbal cues   Wheelchair Wheelchair activity did not occur: N/A        Cognition Comprehension Comprehension assist level: Understands basic 75 - 89% of the time/ requires cueing 10 - 24% of the time  Expression Expression assist level: Expresses basic 90% of the time/requires cueing < 10% of the time.  Social Interaction Social Interaction assist level: Interacts appropriately 75 - 89% of the time - Needs redirection for appropriate language or to initiate interaction.  Problem Solving Problem solving assist level: Solves basic 75 - 89% of the time/requires cueing 10 - 24% of the time  Memory Memory assist level: Recognizes or recalls 75 - 89% of the time/requires cueing 10 - 24% of the time    Medical Problem List and Plan: 1.  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  DVT right gastroc and soleal veins: eliquis after review with NS 3. Pain Management: Monitor HA as activity increases. Continue Fioricet.   Intermittent headaches controlled with medications, overall improving  4. Mood: LCSW to follow for evaluation when appropriate 5. Neuropsych: This patient is not capable of making decisions on his own behalf. 6. Skin/Wound Care: Monitor incision for healing. Maintain adequate hydration and nutritional status.  7. Fluids/Electrolytes/Nutrition: Monitor I/O.   Advanced to regular texture diet 8. HTN: Monitor BP bid with SBP goal < 140.  Norvasc added on 5/9   Controlled on 5/25  Monitor with increased mobility 9. T2DM s/p Whipple for pancreatic cancer: Hgb A1C- 6.8.  Glucotrol 5mg  resumed    CBG (last 3)  Recent Labs    04/26/18 1649 04/26/18 2051 04/27/18 0634  GLUCAP 103* 201* 93  Overall adequate control but some lability 10. Seizure prophylaxis: Keppra bid d/ced on 5/21 11. Cerebral edema: Hypertonic saline and Cleviprex weaned off 5/12--Sodium has normalized.   12.  Fevers: Resolved   UTI +/- atelectasis +/- DVT  Blood cultures likely contaminent  Urine culture E. Faecalis  CXR reviewed, showing atelectasis. Encourage IS  Amoxicillin 500 TID 5/16-5/22, completed 13. A fib with RVR: Heart rate controlled with increase in Metoprolol.  Amiodarone has been weaned to oral route--monitor HR bid and with activity.   Controlled on 5/25-70bpm 14. Headaches:  Currently using Fioricet. Will continue to monitor 15. ABLA:   Hb 9.5 on 5/21, stable value  Cont to monitor  16. Hypoalbuminemia   Supplement initiated on 5/14  LOS (Days) 12 A FACE TO FACE EVALUATION WAS PERFORMED  Charlett Blake 04/27/2018 8:22 AM

## 2018-04-29 NOTE — Progress Notes (Signed)
Social Work  Discharge Note  The overall goal for the admission was met for:   Discharge location: Yes - home with family able to provide 24/7 supervision  Length of Stay: Yes - 12 days  Discharge activity level: Yes - supervision  Home/community participation: Yes  Services provided included: MD, RD, PT, OT, SLP, RN, TR, Pharmacy and SW  Financial Services: Private Insurance: [employer]  Follow-up services arranged: Outpatient: PT, OT, ST via [facility], DME: tub seat via [place] and Patient/Family has no preference for HH/DME agencies  Comments (or additional information):  Patient/Family verbalized understanding of follow-up arrangements: Yes  Individual responsible for coordination of the follow-up plan: pt/wife  Confirmed correct DME delivered: Hector Pugh 04/29/2018    Hector Pugh

## 2018-04-30 ENCOUNTER — Telehealth: Payer: Self-pay

## 2018-04-30 ENCOUNTER — Other Ambulatory Visit: Payer: Self-pay

## 2018-04-30 ENCOUNTER — Ambulatory Visit: Payer: Managed Care, Other (non HMO) | Attending: Internal Medicine | Admitting: Occupational Therapy

## 2018-04-30 DIAGNOSIS — R41840 Attention and concentration deficit: Secondary | ICD-10-CM | POA: Diagnosis present

## 2018-04-30 DIAGNOSIS — R278 Other lack of coordination: Secondary | ICD-10-CM

## 2018-04-30 DIAGNOSIS — R2681 Unsteadiness on feet: Secondary | ICD-10-CM | POA: Insufficient documentation

## 2018-04-30 DIAGNOSIS — I69118 Other symptoms and signs involving cognitive functions following nontraumatic intracerebral hemorrhage: Secondary | ICD-10-CM

## 2018-04-30 DIAGNOSIS — M6281 Muscle weakness (generalized): Secondary | ICD-10-CM | POA: Diagnosis present

## 2018-04-30 DIAGNOSIS — R2689 Other abnormalities of gait and mobility: Secondary | ICD-10-CM | POA: Insufficient documentation

## 2018-04-30 DIAGNOSIS — R41841 Cognitive communication deficit: Secondary | ICD-10-CM | POA: Diagnosis present

## 2018-04-30 DIAGNOSIS — R414 Neurologic neglect syndrome: Secondary | ICD-10-CM | POA: Diagnosis present

## 2018-04-30 DIAGNOSIS — R41842 Visuospatial deficit: Secondary | ICD-10-CM

## 2018-04-30 DIAGNOSIS — R4701 Aphasia: Secondary | ICD-10-CM | POA: Insufficient documentation

## 2018-04-30 NOTE — Telephone Encounter (Signed)
Transitional Care call-Daughter Puja    1. Are you/is patient experiencing any problems since coming home? Orthostatis, doing better  Are there any questions regarding any aspect of care?No 2. Are there any questions regarding medications administration/dosing?No Are meds being taken as prescribed? Yes Patient should review meds with caller to confirm 3. Have there been any falls? No 4. Has Home Health been to the house and/or have they contacted you? No, getting outpatient services   5. Are bowels and bladder emptying properly? Yes 6. Are there any unexpected incontinence issues? No  7. Any fevers, problems with breathing, unexpected pain? No 8. Are there any skin problems or new areas of breakdown? No 9. Has the patient/family member arranged specialty MD follow up (ie cardiology/neurology/renal/surgical/etc)? Yes  10. Does the patient need any other services or support that we can help arrange? No  11. Are caregivers following through as expected in assisting the patient? 12. Has the patient quit smoking, drinking alcohol, or using drugs as recommended?No   Appointment time, arrive time 10:00 for 10:20 [address] [employer] [address]

## 2018-04-30 NOTE — Therapy (Signed)
[facility] [address] [place], [place], [address] Phone: [phone]   Fax:  [phone]  Occupational Therapy Evaluation  Patient Details  Name: Hector Pugh MRN: [id number] Date of Birth: 1961/03/30 Referring Provider: Dr. Delice Lesch   Encounter Date: 04/30/2018  OT End of Session - 04/30/18 1101    Visit Number  1    Number of Visits  17    Date for OT Re-Evaluation  06/30/18    Authorization Type  [employer]    OT Start Time  0800    OT Stop Time  0850    OT Time Calculation (min)  50 min    Activity Tolerance  Patient tolerated treatment well    Behavior During Therapy  [facility] for tasks assessed/performed       Past Medical History:  Diagnosis Date  . 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 Cancer ([place])   . Diabetes mellitus ([place]) 11/10/2012  . Diabetes mellitus without complication (HCC)    diet controlled  . HTN (hypertension) 11/10/2012  . Hypertension   . 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 Pancreatic cancer ([place])   . Pancreatitis 11/10/2012  . Seasonal allergies     Past Surgical History:  Procedure Laterality Date  . circucision    . circumsision    . COLONOSCOPY N/A 04/04/2018   Procedure: COLONOSCOPY;  Surgeon: Leighton Ruff, MD;  Location: [facility];  Service: Endoscopy;  Laterality: N/A;  . CRANIOTOMY Left 04/05/2018   Procedure: Left Temporal CRANIOTOMY HEMATOMA EVACUATION of Intracranial Hemorrhage;  Surgeon: Ashok Pall, MD;  Location: [facility];  Service: Neurosurgery;  Laterality: Left;  . ERCP  11/08/2012   Procedure: ENDOSCOPIC RETROGRADE CHOLANGIOPANCREATOGRAPHY (ERCP);  Surgeon: Jeryl Columbia, MD;  Location: [facility];  Service: Gastroenterology;  Laterality: N/A;  . EUS  11/08/2012   Procedure: UPPER ENDOSCOPIC ULTRASOUND (EUS) LINEAR;  Surgeon: Arta Silence, MD;  Location: [facility];  Service: Endoscopy;  Laterality: N/A;  . FINE NEEDLE  ASPIRATION  11/08/2012   Procedure: FINE NEEDLE ASPIRATION (FNA) LINEAR;  Surgeon: Arta Silence, MD;  Location: [facility];  Service: Endoscopy;  Laterality: N/A;  . HERNIA REPAIR     RIGHT  . LAPAROSCOPY  12/12/2012   Procedure: LAPAROSCOPY DIAGNOSTIC;  Surgeon: Stark Klein, MD;  Location: [facility];  Service: General;  Laterality: N/A;  . Left Foot Surgery    . Left Foot Surgery    . VASECTOMY    . WHIPPLE PROCEDURE  12/12/2012   Procedure: WHIPPLE PROCEDURE;  Surgeon: Stark Klein, MD;  Location: [facility];  Service: General;  Laterality: N/A;    There were no vitals filed for this visit.  Subjective Assessment - 04/30/18 0809    Patient is accompained by:  Family member daughter    Pertinent History  PMH: Ampullary Adenacarcinoma, DM2, HTN, tachycardia, A-fib    Limitations  no driving, 99/3 sup, no strenuous activity or lifting    Patient Stated Goals  be independent    Currently in Pain?  No/denies        [facility] OT Assessment - 04/30/18 0001      Assessment   Medical Diagnosis  Lt temporal ICH, s/p craniotomy for evacuation    Referring Provider  Dr. Delice Lesch    Onset Date/Surgical Date  04/05/18    Hand Dominance  Right    Prior Therapy  Inpatient rehab 04/15/18 - 04/27/18      Precautions  Precautions  Other (comment)    Precaution Comments  no driving, 29/9 supervision, no strenous activity or lifting      Balance Screen   Has the patient fallen in the past 6 months  No    Has the patient had a decrease in activity level because of a fear of falling?   No    Is the patient reluctant to leave their home because of a fear of falling?   No      Home  Environment   Astronomer;Door    Additional Comments  Pt lives in 2 story home with 2 steps to enter. 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, gardening      ADL   Eating/Feeding  Modified independent needs assist to cut food d/t safety    Grooming  Modified independent Copy    Upper Body Bathing  Supervision/safety    Lower Body Bathing  Supervision/safety    Upper Body Dressing  Supervision/safety    Lower Body Dressing  Supervision/safety    Toilet Transfer  Modified independent    Toileting - Clothing Manipulation  Modified independent    Tub/Shower Transfer  Supervision/safety      IADL   Shopping  Completely unable to shop    Light Housekeeping  -- dependent    Meal Prep  -- dependent, however pt did some cooking prior to [place] on family or friends for transportation    Medication Management  Takes responsibility if medication is prepared in advance in seperate dosage;Has difficulty remembering to take medication supervision    Financial Management  Dependent      Mobility   Mobility Status  Independent      Written Expression   Dominant Hand  Right      Vision - History   Baseline Vision  Bifocals      Vision Assessment   Ocular Range of Motion  Within Functional Limits    Tracking/Visual Pursuits  Unable to hold eye position out of midline Pt loses fixation (especially to Lt): difficult to determine    Convergence  Impaired (comment) Rt eye did not appear to converge properly    Visual Fields  Right visual field deficit significant    Comment  Pt also has Rt inattention. Difficult to determine if decreased ability with tracking/smooth pursuits d/t visual inattention, aphasia, possible scotomas (as pt seems to lose target), or just overall decr. attention. Pt also had a hard time following directions d/t aphasia      Cognition   Overall Cognitive Status  Cognition to be further assessed in functional context PRN    Cognition Comments  Pt has aphasia and does not seem to understand as well in [place] since [place]. Pt seems to understand better when daughter  explains in native language [place]) or Hindi. However, pt speaks [place]. Pt has speech evaluation scheduled for mid June      Observation/Other Assessments   Observations  Pt looked to daughter t/o evaluation for further explanation when asked questions. Pt also tried to turn Lt upon arrival, then turned Rt (towards bathrooms) even when therapist instructed pt to go "straight' down hall  Sensation   Light Touch  Appears Intact      Coordination   9 Hole Peg Test  Right;Left    Right 9 Hole Peg Test  31.53 sec    Left 9 Hole Peg Test  33.84 sec      Perception   Perception  Impaired    Inattention/Neglect  Does not attend to right visual field      Praxis   Praxis  Impaired    Praxis Impairment Details  Motor planning      Edema   Edema  none      ROM / Strength   AROM / PROM / Strength  AROM;Strength      AROM   Overall AROM Comments  BUE AROM WNL's      Strength   Overall Strength Comments  BUE MMT grossly 5/5      Hand Function   Right Hand Grip (lbs)  45 lbs    Left Hand Grip (lbs)  66 lbs                        OT Short Term Goals - 04/30/18 1317      OT SHORT TERM GOAL #1   Title  Independent with visual HEP     Time  4    Period  Weeks    Status  New    Target Date  05/31/18      OT SHORT TERM GOAL #2   Title  Independent with putty HEP for Rt hand    Time  4    Period  Weeks    Status  New      OT SHORT TERM GOAL #3   Title  Pt/family to verbalize understanding with visual compensatory strategies for Rt visual field cut    Time  4    Period  Weeks    Status  New      OT SHORT TERM GOAL #4   Title  Pt to perform simple snack prep and light household chores with supervision only    Time  4    Period  Weeks    Status  New      OT SHORT TERM GOAL #5   Title  Pt to perform environmental scanning with 75% accuracy or greater    Time  4    Period  Weeks    Status  New        OT Long Term Goals - 04/30/18 1321       OT LONG TERM GOAL #1   Title  Pt to improve grip strength Rt dominant hand to 60 lbs or greater    Baseline  45 lbs    Time  8    Period  Weeks    Status  New    Target Date  06/30/18      OT LONG TERM GOAL #2   Title  Pt to perform environmental scanning while performing simple physical task at 90% accuracy    Time  8    Period  Weeks    Status  New      OT LONG TERM GOAL #3   Title  Pt to return to cooking tasks with direct supervision only    Time  8    Period  Weeks    Status  New      OT LONG TERM GOAL #4   Title  Pt to return to home management tasks with  direct supervision only    Time  8    Period  Weeks    Status  New            Plan - 04/30/18 1310    Clinical Impression Statement  Pt is a 57 y.o. male who presents to outpatient rehab s/p Lt temporal ICH on 04/05/18 with craniotomy for evacuation (same day). Pt was on inpatient rehab from 5/13-5/25/19. Pt currently presents with mild strength deficits on Rt side, significant Rt visual field cut, Rt inattention, decreased attention/cognition, and aphasia.     Occupational Profile and client history currently impacting functional performance  PMH: Ampullary adenacarcinoma, DM type 2, HTN, tachycardia, A-fib. 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[employer]    Occupational performance deficits (Please refer to evaluation for details):  ADL's;IADL's;Work;Leisure;Social Participation    Rehab Potential  Good    Current Impairments/barriers affecting progress:  visual deficits, aphasia    OT Frequency  2x / week    OT Duration  8 weeks Plus evaluation    OT Treatment/Interventions  Self-care/ADL training;DME and/or AE instruction;Therapeutic activities;Therapeutic exercise;Cognitive remediation/compensation;Coping strategies training;Neuromuscular education;Functional Mobility Training;Passive range of motion;Visual/perceptual remediation/compensation;Manual Therapy;Patient/family education    Plan   putty HEP, letter cancellation, line by section test, further assess attention as able, trail making part A    Clinical Decision Making  Several treatment options, min-mod task modification necessary    Consulted and Agree with Plan of Care  Patient;Family member/caregiver    Family Member Consulted  daughter       Patient will benefit from skilled therapeutic intervention in order to improve the following deficits and impairments:  Decreased coordination, Improper body mechanics, Decreased safety awareness, Decreased endurance, Decreased activity tolerance, Decreased knowledge of precautions, Impaired UE functional use, Decreased knowledge of use of DME, Decreased cognition, Decreased mobility, Decreased strength, Impaired perceived functional ability, Impaired vision/preception  Visit Diagnosis: Visuospatial deficit - Plan: Ot plan of care cert/re-cert  Neurologic neglect syndrome - Plan: Ot plan of care cert/re-cert  Muscle weakness (generalized) - Plan: Ot plan of care cert/re-cert  Other symptoms and signs involving cognitive functions following nontraumatic intracerebral hemorrhage - Plan: Ot plan of care cert/re-cert  Attention and concentration deficit - Plan: Ot plan of care cert/re-cert  Other lack of coordination - Plan: Ot plan of care cert/re-cert    Problem List Patient Active Problem List   Diagnosis Date Noted  . DVT, lower extremity, distal, acute, right ([place]) 04/25/2018  . Acute lower UTI   . Atelectasis   . Hypoalbuminemia due to protein-calorie malnutrition ([place])   . Left temporal lobe hemorrhage ([place]) 04/15/2018  . Vascular headache   . Seizure prophylaxis   . Cerebral edema (HCC)   . Atrial fibrillation with rapid ventricular response ([place])   . Pneumonia   . Diabetes mellitus type 2 in nonobese (HCC)   . Benign essential HTN   . History of pancreatic cancer   . FUO (fever of unknown origin)   . Dysphagia, post-stroke   . Tachycardia   . Tachypnea   .  Hypothyroidism   . Hypernatremia   . Hypokalemia   . Acute blood loss anemia   . Thrombocytopenia ([place])   . Intracranial hemorrhage ([place]) 04/05/2018  . Acute pancreatitis 06/11/2014  . Headache 06/11/2014  . 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 Ampullary carcinoma ([place]) 12/06/2012  . Obstructive jaundice 11/11/2012  . Biliary stricture 11/11/2012  . Diabetes mellitus ([place]) 11/10/2012  .  HTN (hypertension) 11/10/2012  . Pancreatitis 11/10/2012  . Abdominal pain 11/10/2012    Carey Bullocks, OTR/L 04/30/2018, 1:28 PM  [facility] [address] [place], [place], [address] Phone: [phone]   Fax:  [phone]  Name: Hector Pugh MRN: [id number] Date of Birth: 1961/09/07

## 2018-05-02 ENCOUNTER — Ambulatory Visit: Payer: Managed Care, Other (non HMO)

## 2018-05-03 ENCOUNTER — Ambulatory Visit: Payer: Managed Care, Other (non HMO)

## 2018-05-03 ENCOUNTER — Other Ambulatory Visit: Payer: Self-pay

## 2018-05-03 ENCOUNTER — Encounter: Payer: Self-pay | Admitting: Rehabilitation

## 2018-05-03 ENCOUNTER — Ambulatory Visit: Payer: Managed Care, Other (non HMO) | Admitting: Rehabilitation

## 2018-05-03 DIAGNOSIS — R41842 Visuospatial deficit: Secondary | ICD-10-CM | POA: Diagnosis not present

## 2018-05-03 DIAGNOSIS — R2689 Other abnormalities of gait and mobility: Secondary | ICD-10-CM

## 2018-05-03 DIAGNOSIS — R4701 Aphasia: Secondary | ICD-10-CM

## 2018-05-03 DIAGNOSIS — R41841 Cognitive communication deficit: Secondary | ICD-10-CM

## 2018-05-03 DIAGNOSIS — R278 Other lack of coordination: Secondary | ICD-10-CM

## 2018-05-03 DIAGNOSIS — R2681 Unsteadiness on feet: Secondary | ICD-10-CM

## 2018-05-03 NOTE — Therapy (Signed)
[facility] [address] [place], [place], [address] Phone: [phone]   Fax:  [phone]  Speech Language Pathology Evaluation  Patient Details  Name: Hector Pugh MRN: [id number] Date of Birth: [birth year] Referring Provider: Delice Lesch, MD   Encounter Date: 05/03/2018  End of Session - 05/03/18 1620    Visit Number  1    Number of Visits  25    Date for SLP Re-Evaluation  08/01/18 (90 days)    Authorization Type  pre-auth Christella Scheuermann    SLP Start Time  1151    SLP Stop Time   1232    SLP Time Calculation (min)  41 min    Activity Tolerance  Patient tolerated treatment well       Past Medical History:  Diagnosis Date  . 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 Cancer ([place])   . Diabetes mellitus ([place]) 11/10/2012  . Diabetes mellitus without complication (HCC)    diet controlled  . HTN (hypertension) 11/10/2012  . Hypertension   . 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 Pancreatic cancer ([place])   . Pancreatitis 11/10/2012  . Seasonal allergies     Past Surgical History:  Procedure Laterality Date  . circucision    . circumsision    . COLONOSCOPY N/A 04/04/2018   Procedure: COLONOSCOPY;  Surgeon: Leighton Ruff, MD;  [facility];  Service: Endoscopy;  Laterality: N/A;  . CRANIOTOMY Left 04/05/2018   Procedure: Left Temporal CRANIOTOMY HEMATOMA EVACUATION of Intracranial Hemorrhage;  Surgeon: Ashok Pall, MD;  Location: [facility];  Service: Neurosurgery;  Laterality: Left;  . ERCP  11/08/2012   Procedure: ENDOSCOPIC RETROGRADE CHOLANGIOPANCREATOGRAPHY (ERCP);  Surgeon: Jeryl Columbia, MD;  Location[facility];  Service: Gastroenterology;  Laterality: N/A;  . EUS  11/08/2012   Procedure: UPPER ENDOSCOPIC ULTRASOUND (EUS) LINEAR;  Surgeon: Arta Silence, MD;  [facility];  Service: Endoscopy;  Laterality: N/A;  . FINE NEEDLE ASPIRATION  11/08/2012   Procedure:  FINE NEEDLE ASPIRATION (FNA) LINEAR;  Surgeon: Arta Silence, MD;  [facility];  Service: Endoscopy;  Laterality: N/A;  . HERNIA REPAIR     RIGHT  . LAPAROSCOPY  12/12/2012   Procedure: LAPAROSCOPY DIAGNOSTIC;  Surgeon: Stark Klein, MD;  [facility];  Service: General;  Laterality: N/A;  . Left Foot Surgery    . Left Foot Surgery    . VASECTOMY    . WHIPPLE PROCEDURE  12/12/2012   Procedure: WHIPPLE PROCEDURE;  Surgeon: Stark Klein, MD;  [facility];  Service: General;  Laterality: N/A;    There were no vitals filed for this visit.  Subjective Assessment - 05/03/18 1158    Subjective  "[place]" - SLP Asked for daughter's name and pt provided wife's name.    Patient is accompained by:  Family member Hector Pugh, dtr    Currently in Pain?  No/denies         SLP Evaluation [facility] - 05/03/18 1158      SLP Visit Information   SLP Received On  05/03/18    Referring Provider  Delice Lesch, MD    Onset Date  04-05-18    Medical Diagnosis  CVA      General Information   HPI  [name] is a 57 y.o. male with history of pancreatic endocrine tumor s/p wipple procedure in 2012, DM, HTN admitted for slurred speech, N/V and right sided weakness. Dx with left temporal  ICH s/p hematoma evacuation 5/3. Remained intubated and on the vent until 5/5. Rec'd ST on CIR.      Prior Functional Status   Cognitive/Linguistic Baseline  Within functional limits    Type of Home  House     Lives With  Spouse;Daughter    Education  Masters degree    Vocation  Full time employment chemist      Cognition   Overall Cognitive Status  Impaired/Different from baseline    Area of Impairment  Attention;Awareness    Attention Comments  pt is impulsive - demonstrated decr in internal sustained/selective attention - pt does not cont thought process through to note an activity is not safe if he attempts it    Awareness  Emergent    Awareness Comments  pt unaware of errors in words at this time     Behaviors  Impulsive pt jumped up from chair when SLP called his name      Auditory Comprehension   Overall Auditory Comprehension  Impaired    Yes/No Questions  Impaired    Complex Questions  50-74% accurate    Commands  Impaired    One Step Basic Commands  50-74% accurate 60%    Two Step Basic Commands  -- 50% mod difficult    Conversation  Simple    Other Conversation Comments  During conversation today SLP had to repeat or rephrase simple questions for pt to demo understanding.     Interfering Components  Visual impairments;Attention    EffectiveTechniques  Extra processing time;Repetition rephrasing    Overall Auditory Comprehension Comments  Daughter tells SLP native language rec language is better than English.       Reading Comprehension   Reading Status  Impaired    Sentence Level  51-75% accurate    Interfering Components  Right neglect/inattention aphasia    Effective Techniques  Verbal cueing;Visual cueing extra time      Verbal Expression   Overall Verbal Expression  Impaired    Level of Generative/Spontaneous Verbalization  Sentence pt's conversation open ended ?s -mostly vague speech    Repetition  Impaired    Level of Impairment  Word level;Phrase level    Other Verbal Expression Comments  Pt with minmal vague speech in salient topic (his occupation) however in more general or uninteresting topics (what pt did with ST on CIR) pt's speech much more vague and empty devoid of much meaning. Pt unaware of this. Pt demonstrated aphasic errors when reading conversational phrases, without awareness. In cookie thief desctiption pt with multiple aphasic errors with pronouns without awareness. Even with SLP questioning/repetition of pt's verbal expression x3 pt still did not notice error/s.       Motor Speech   Overall Motor Speech  Other (comment) possible; aphasia vs apraxia    Articulation  -- pt with consistent p/f (initial) errors-? accent    Motor Planning  -- but  moreaphasic errors on expanding words"sit, city, citizen                      SLP Education - 05/03/18 1620    Education Details  eval results, possible goals    Person(s) Educated  Patient;Child(ren)    Methods  Explanation    Comprehension  Verbalized understanding;Need further instruction       SLP Short Term Goals - 05/03/18 1630      SLP SHORT TERM GOAL #1   Title  pt will generate sentence responses in min-mod  complex language tasks 80% accuracy with min-mod A occasionally for error awareness    Time  4    Period  Weeks ot 9 total sessions, for all STGs    Status  New      SLP SHORT TERM GOAL #2   Title  pt will read sentences of 8-9 words with error awareness at 90% over three sessions    Time  4    Period  Weeks    Status  New      SLP SHORT TERM GOAL #3   Title  pt will demo understanding of spoken sentence level stimuli with 80% success over three sesions    Time  4    Period  Weeks    Status  New      SLP SHORT TERM GOAL #4   Title  pt will complete cognitive linguistic testing within first 4 therapy sessions    Time  2    Period  Weeks or 5 total sessions    Status  New      SLP SHORT TERM GOAL #5   Title  pt will complete a HEP for increasing accuracy of his verbal output with 90% success with rare min A for error awareness    Time  4    Period  Weeks    Status  New       SLP Long Term Goals - 05/03/18 1636      SLP LONG TERM GOAL #1   Title  pt will complete a HEP for improving accuracy of his verbal output with 90% success over three sessions    Time  8    Period  Weeks or 17 sessions    Status  New      SLP LONG TERM GOAL #2   Title  pt will participate in 8 minutes of mod complex conversation appropriately, with modified independence    Time  8    Period  Weeks or 17 sessions    Status  New      SLP LONG TERM GOAL #3   Title  pt will demo understanding of 8 minutes mod complex conversation over three visits, with modified  independence    Time  8    Period  Weeks or 17 visits    Status  New      SLP LONG TERM GOAL #4   Title  pt will demo understanding of 15 minutes mod complex/complex conversation over three visits, with modified independence    Time  12    Period  Weeks or 25 total visits    Status  New      SLP LONG TERM GOAL #5   Title  pt will demo awareness of verbal speech errors by correcting 95% of errors in 15 minutes mod complex/complex conversation over two sessions    Time  12    Period  Weeks or 25 visits    Status  New       Plan - 05/03/18 1621    Clinical Impression Statement  [name] presents today with mod aphasia (mod expressive, mod receptive) which is less of a deficit in his native language. SLP told daughter today that if pt does not understand in [place] prompt pt in native language and then again in [place]. Pt also suffers from some degree of cognitive-linguistic deficits (SLP unable to test today due to time constraints). Written and reading tasks are complicated by rt inattention. and rt field cut. He  would benefit from skilled ST targeting langauge skills (recpetive and expressive), and from a cognitive linguistic evaluation and probable goals generated, as OT is not directly addressing this in their plan of care.     Speech Therapy Frequency  2x / week    Duration  -- 12 weeks, or 25 total visits    Treatment/Interventions  Internal/external aids;Patient/family education;Compensatory strategies;SLP instruction and feedback;Functional tasks;Cognitive reorganization;Cueing hierarchy;Environmental controls;Language facilitation    Potential to Achieve Goals  Good    Consulted and Agree with Plan of Care  Patient;Family member/caregiver    Family Member Consulted  daughter       Patient will benefit from skilled therapeutic intervention in order to improve the following deficits and impairments:   Aphasia  Cognitive communication deficit    Problem List Patient Active  Problem List   Diagnosis Date Noted  . DVT, lower extremity, distal, acute, right ([place]) 04/25/2018  . Acute lower UTI   . Atelectasis   . Hypoalbuminemia due to protein-calorie malnutrition ([place])   . Left temporal lobe hemorrhage ([place]) 04/15/2018  . Vascular headache   . Seizure prophylaxis   . Cerebral edema (H[facility])   . Atrial fibrillation with rapid ventricular response ([place])   . Pneumonia   . Diabetes mellitus type 2 in nonobese (HCC)   . Benign essential HTN   . History of pancreatic cancer   . FUO (fever of unknown origin)   . Dysphagia, post-stroke   . Tachycardia   . Tachypnea   . Hypothyroidism   . Hypernatremia   . Hypokalemia   . Acute blood loss anemia   . Thrombocytopenia ([place])   . Intracranial hemorrhage ([place]) 04/05/2018  . Acute pancreatitis 06/11/2014  . Headache 06/11/2014  . 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 Obstructive jaundice 11/11/2012  . Biliary stricture 11/11/2012  . Diabetes mellitus ([place]) 11/10/2012  . HTN (hypertension) 11/10/2012  . Pancreatitis 11/10/2012  . Abdominal pain 11/10/2012    [facility] ,[place], [place]  05/03/2018, 4:43 PM  [facility] [address] [place], [place], [address] Phone: [phone]   Fax:  [phone]  Name: Hector Pugh MRN: [id number] Date of Birth: [birth year]

## 2018-05-03 NOTE — Therapy (Signed)
[facility] [address] [place], [place], [address] Phone: [phone]   Fax:  [phone]  Physical Therapy Evaluation  Patient Details  Name: Hector Pugh MRN: [id number] Date of Birth: 01-25-1961 Referring Provider: Dr. Delice Lesch   Encounter Date: 05/03/2018  PT End of Session - 05/03/18 1306    Visit Number  1    Number of Visits  9    Date for PT Re-Evaluation  06/02/18    Authorization Type  [employer]    PT Start Time  1103    PT Stop Time  1145    PT Time Calculation (min)  42 min    Activity Tolerance  Patient tolerated treatment well    Behavior During Therapy  [facility] for tasks assessed/performed       Past Medical History:  Diagnosis Date  . 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 Cancer ([place])   . Diabetes mellitus ([place]) 11/10/2012  . Diabetes mellitus without complication (HCC)    diet controlled  . HTN (hypertension) 11/10/2012  . Hypertension   . 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 Pancreatic cancer ([place])   . Pancreatitis 11/10/2012  . Seasonal allergies     Past Surgical History:  Procedure Laterality Date  . circucision    . circumsision    . COLONOSCOPY N/A 04/04/2018   Procedure: COLONOSCOPY;  Surgeon: Leighton Ruff, MD;  [facility];  Service: Endoscopy;  Laterality: N/A;  . CRANIOTOMY Left 04/05/2018   Procedure: Left Temporal CRANIOTOMY HEMATOMA EVACUATION of Intracranial Hemorrhage;  Surgeon: Ashok Pall, MD;  Location: [facility];  Service: Neurosurgery;  Laterality: Left;  . ERCP  11/08/2012   Procedure: ENDOSCOPIC RETROGRADE CHOLANGIOPANCREATOGRAPHY (ERCP);  Surgeon: Jeryl Columbia, MD;  [facility];  Service: Gastroenterology;  Laterality: N/A;  . EUS  11/08/2012   Procedure: UPPER ENDOSCOPIC ULTRASOUND (EUS) LINEAR;  Surgeon: Arta Silence, MD;  [facility];  Service: Endoscopy;  Laterality: N/A;  . FINE NEEDLE  ASPIRATION  11/08/2012   Procedure: FINE NEEDLE ASPIRATION (FNA) LINEAR;  Surgeon: Arta Silence, MD;  [facility];  Service: Endoscopy;  Laterality: N/A;  . HERNIA REPAIR     RIGHT  . LAPAROSCOPY  12/12/2012   Procedure: LAPAROSCOPY DIAGNOSTIC;  Surgeon: Stark Klein, MD;  [facility];  Service: General;  Laterality: N/A;  . Left Foot Surgery    . Left Foot Surgery    . VASECTOMY    . WHIPPLE PROCEDURE  12/12/2012   Procedure: WHIPPLE PROCEDURE;  Surgeon: Stark Klein, MD;  [facility];  Service: General;  Laterality: N/A;    There were no vitals filed for this visit.   Subjective Assessment - 05/03/18 1107    Subjective  Per pt, reports he is doing well at home. Daughter reports deficits are mostly vision and attention deficits.  Did move into new home once discharched from hospital.      Patient is accompained by:  Family member Puja    Limitations  Walking    How long can you walk comfortably?  Has been able to ambulate in stores without issue.      Patient Stated Goals  "To move as best I can."     Currently in Pain?  No/denies         [facility] PT Assessment - 05/03/18 1112      Assessment   Medical Diagnosis  L temporal ICH, s/p crani to evacuate  Referring Provider  Dr. Delice Lesch    Onset Date/Surgical Date  04/05/18    Hand Dominance  Right    Prior Therapy  Inpatient rehab 04/15/18 - 04/27/18      Precautions   Precautions  Other (comment)    Precaution Comments  no driving, 62/8 supervision, no strenous activity or lifting      Balance Screen   Has the patient fallen in the past 6 months  No    Has the patient had a decrease in activity level because of a fear of falling?   No    Is the patient reluctant to leave their home because of a fear of falling?   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, gardening       Cognition   Overall Cognitive Status  Impaired/Different from baseline see SLP note details    Area of Impairment  Attention;Following commands;Safety/judgement    Current Attention Level  Sustained    Following Commands  Follows one step commands inconsistently depending on level of distraction    Safety/Judgement  Decreased awareness of deficits    Attention  Sustained    Sustained Attention  Impaired    Sustained Attention Impairment  Verbal basic;Functional basic    Awareness  Impaired    Awareness Impairment  Intellectual impairment    Behaviors  Impulsive      Sensation   Light Touch  Appears Intact    Hot/Cold  Appears Intact    Proprioception  Appears Intact      Coordination   Gross Motor Movements are Fluid and Coordinated  Yes    Fine Motor Movements are Fluid and Coordinated  Yes    Coordination and Movement Description  grossly WFL, R is slight uncordinated    Heel Shin Test  slightly impaired RLE      ROM / Strength   AROM / PROM / Strength  Strength      Strength   Overall Strength  Within functional limits for tasks performed    Overall Strength Comments  grossly 4-5/5, note L quad weakness with testing (could have been due to aphasia)      Transfers   Transfers  Sit to Stand;Stand to Sit    Sit to Stand  7: Independent    Stand to Sit  7: Independent      Ambulation/Gait   Ambulation/Gait  Yes    Ambulation/Gait Assistance  6: Modified independent (Device/Increase time);5: Supervision;4: Min guard    Ambulation/Gait Assistance Details  Assessed ambulation outdoors during evaluation over varying surfaces.  Note that pt has marked difficulty with dual tasking and requires intermittent guiding to avoid curb to the R.  Max verbal cues for direction esp when engaging in conversation.      Ambulation Distance (Feet)  300 Feet    Assistive device  None    Gait Pattern  Within Functional Limits    Ambulation Surface  Level;Indoor    Gait velocity  4.17 ft/sec  without device    Stairs  Yes    Stairs Assistance  7: Independent    Stair Management Technique  No rails    Number of Stairs  4    Height of Stairs  6      Functional Gait  Assessment   Gait  assessed   Yes    Gait Level Surface  Walks 20 ft in less than 5.5 sec, no assistive devices, good speed, no evidence for imbalance, normal gait pattern, deviates no more than 6 in outside of the 12 in walkway width.    Change in Gait Speed  Able to smoothly change walking speed without loss of balance or gait deviation. Deviate no more than 6 in outside of the 12 in walkway width.    Gait with Horizontal Head Turns  Performs head turns smoothly with slight change in gait velocity (eg, minor disruption to smooth gait path), deviates 6-10 in outside 12 in walkway width, or uses an assistive device.    Gait with Vertical Head Turns  Performs task with slight change in gait velocity (eg, minor disruption to smooth gait path), deviates 6 - 10 in outside 12 in walkway width or uses assistive device    Gait and Pivot Turn  Pivot turns safely within 3 sec and stops quickly with no loss of balance.    Step Over Obstacle  Is able to step over 2 stacked shoe boxes taped together (9 in total height) without changing gait speed. No evidence of imbalance.    Gait with Narrow Base of Support  Ambulates 4-7 steps.    Gait with Eyes Closed  Walks 20 ft, slow speed, abnormal gait pattern, evidence for imbalance, deviates 10-15 in outside 12 in walkway width. Requires more than 9 sec to ambulate 20 ft.    Ambulating Backwards  Walks 20 ft, uses assistive device, slower speed, mild gait deviations, deviates 6-10 in outside 12 in walkway width.    Steps  Alternating feet, no rail.    Total Score  23    FGA comment:  19-24 = medium risk fall                Objective measurements completed on examination: See above findings.              PT Education - 05/03/18 1305    Education Details  evaluation  findings, goals, POC    Person(s) Educated  Patient;Child(ren)    Methods  Explanation;Demonstration    Comprehension  Verbalized understanding;Returned demonstration       PT Short Term Goals - 05/03/18 1313      PT SHORT TERM GOAL #1   Title  =LTGs        PT Long Term Goals - 05/03/18 1313      PT LONG TERM GOAL #1   Title  Pt will be independent with final HEP in order to indicate decreased fall risk and improved functional mobility.  (Target date: 06/02/18)    Time  4    Period  Weeks    Status  New    Target Date  06/02/18      PT LONG TERM GOAL #2   Title  Pt will improve FGA to >/=28/30 in order to indicate decreased fall risk.      Time  4    Period  Weeks    Status  New      PT LONG TERM GOAL #3   Title  Pt will perform floor to stand tranfser without support and with no report of dizziness/light headedness in order to return to preferred prayer position.      Time  4    Period  Weeks    Status  New      PT LONG TERM GOAL #4  Title  Pt will ambulate >1000' over varying outdoor surfaces while engaging in dual task without overt LOB and with increased attention to R environment in order to return to community and leisure activity.     Time  4    Period  Weeks    Status  New             Plan - 05/03/18 1307    Clinical Impression Statement  Pt presents s/p L temporal ICH w/ resultant craniotomy to evacuate on 04/05/18.  Note CIR admission from 5/13-5/25.  Pt demonstrates high level balance deficits, minor decrease in coordination, and deficits related to R field cut and R inattention.  Upon PT evaluation, note decreased coordination, FGA score of 23/30 indicative of moderate fall risk, decreased ability to dual task, and pt/daughter reports difficulty getting up from floor (to pray).  Pt will benefit from skilled OP neuro PT in order to address deficits.      History and Personal Factors relevant to plan of care:  see above    Clinical Presentation  Evolving     Clinical Presentation due to:  see above    Clinical Decision Making  Moderate    Rehab Potential  Good    Clinical Impairments Affecting Rehab Potential  R inattention and R field cut    PT Frequency  2x / week    PT Duration  4 weeks    PT Treatment/Interventions  ADLs/Self Care Home Management;Gait training;Stair training;Functional mobility training;Therapeutic activities;Therapeutic exercise;Balance training;Neuromuscular re-education;Cognitive remediation;Patient/family education;Energy conservation    PT Next Visit Plan  est HEP for high level balance, dual tasking, outdoor gait/balance tasks, floor to stand transfer to simulate prayer position    Consulted and Agree with Plan of Care  Patient;Family member/caregiver    Family Member Consulted  daughter       Patient will benefit from skilled therapeutic intervention in order to improve the following deficits and impairments:  Decreased activity tolerance, Decreased balance, Decreased safety awareness, Decreased coordination, Impaired vision/preception  Visit Diagnosis: Unsteadiness on feet  Other abnormalities of gait and mobility  Other lack of coordination     Problem List Patient Active Problem List   Diagnosis Date Noted  . DVT, lower extremity, distal, acute, right ([place]) 04/25/2018  . Acute lower UTI   . Atelectasis   . Hypoalbuminemia due to protein-calorie malnutrition ([place])   . Left temporal lobe hemorrhage ([place]) 04/15/2018  . Vascular headache   . Seizure prophylaxis   . Cerebral edema ([facility])   . Atrial fibrillation with rapid ventricular response ([place])   . Pneumonia   . Diabetes mellitus type 2 in nonobese ([facility])   . Benign essential HTN   . History of pancreatic cancer   . FUO (fever of unknown origin)   . Dysphagia, post-stroke   . Tachycardia   . Tachypnea   . Hypothyroidism   . Hypernatremia   . Hypokalemia   . Acute blood loss anemia   . Thrombocytopenia ([place]   . Intracranial hemorrhage ([place])  04/05/2018  . Acute pancreatitis 06/11/2014  . Headache 06/11/2014  . 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 Obstructive jaundice 11/11/2012  . Biliary stricture 11/11/2012  . Diabetes mellitus ([place]) 11/10/2012  . HTN (hypertension) 11/10/2012  . Pancreatitis 11/10/2012  . Abdominal pain 11/10/2012    Cameron Sprang, PT, MPT [facility] [address] [place], [place], [address] Phone: [phone]   Fax:  [phone]  05/03/18, 1:16 PM  Name: Hector Pugh MRN: [id number] Date of Birth: 06-20-1961

## 2018-05-06 ENCOUNTER — Ambulatory Visit: Payer: Managed Care, Other (non HMO) | Attending: Internal Medicine | Admitting: Occupational Therapy

## 2018-05-06 DIAGNOSIS — R2681 Unsteadiness on feet: Secondary | ICD-10-CM | POA: Diagnosis present

## 2018-05-06 DIAGNOSIS — R278 Other lack of coordination: Secondary | ICD-10-CM | POA: Insufficient documentation

## 2018-05-06 DIAGNOSIS — R2689 Other abnormalities of gait and mobility: Secondary | ICD-10-CM | POA: Diagnosis present

## 2018-05-06 DIAGNOSIS — M6281 Muscle weakness (generalized): Secondary | ICD-10-CM | POA: Diagnosis present

## 2018-05-06 DIAGNOSIS — R41841 Cognitive communication deficit: Secondary | ICD-10-CM | POA: Diagnosis present

## 2018-05-06 DIAGNOSIS — R41842 Visuospatial deficit: Secondary | ICD-10-CM | POA: Insufficient documentation

## 2018-05-06 DIAGNOSIS — R4701 Aphasia: Secondary | ICD-10-CM | POA: Diagnosis present

## 2018-05-06 DIAGNOSIS — R414 Neurologic neglect syndrome: Secondary | ICD-10-CM | POA: Insufficient documentation

## 2018-05-06 DIAGNOSIS — R41840 Attention and concentration deficit: Secondary | ICD-10-CM | POA: Diagnosis present

## 2018-05-06 DIAGNOSIS — I69118 Other symptoms and signs involving cognitive functions following nontraumatic intracerebral hemorrhage: Secondary | ICD-10-CM | POA: Diagnosis present

## 2018-05-06 NOTE — Therapy (Signed)
[facility] [address] [place], [place], [address] Phone: [phone]   Fax:  [phone]  Occupational Therapy Treatment  Patient Details  Name: Hector Pugh MRN: [id number] Date of Birth: July 15, 1961 Referring Provider: Dr. Delice Lesch   Encounter Date: 05/06/2018  OT End of Session - 05/06/18 1209    Visit Number  2    Number of Visits  17    Date for OT Re-Evaluation  06/30/18    Authorization Type  [employer]    OT Start Time  1015    OT Stop Time  1103    OT Time Calculation (min)  48 min    Activity Tolerance  Patient tolerated treatment well    Behavior During Therapy  [facility] for tasks assessed/performed       Past Medical History:  Diagnosis Date  . 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 Cancer ([place])   . Diabetes mellitus ([place]) 11/10/2012  . Diabetes mellitus without complication (HCC)    diet controlled  . HTN (hypertension) 11/10/2012  . Hypertension   . 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 Pancreatic cancer ([place])   . Pancreatitis 11/10/2012  . Seasonal allergies     Past Surgical History:  Procedure Laterality Date  . circucision    . circumsision    . COLONOSCOPY N/A 04/04/2018   Procedure: COLONOSCOPY;  Surgeon: Leighton Ruff, MD;  [facility];  Service: Endoscopy;  Laterality: N/A;  . CRANIOTOMY Left 04/05/2018   Procedure: Left Temporal CRANIOTOMY HEMATOMA EVACUATION of Intracranial Hemorrhage;  Surgeon: Ashok Pall, MD;  Location: [facility];  Service: Neurosurgery;  Laterality: Left;  . ERCP  11/08/2012   Procedure: ENDOSCOPIC RETROGRADE CHOLANGIOPANCREATOGRAPHY (ERCP);  Surgeon: Jeryl Columbia, MD;  Location: [facility];  Service: Gastroenterology;  Laterality: N/A;  . EUS  11/08/2012   Procedure: UPPER ENDOSCOPIC ULTRASOUND (EUS) LINEAR;  Surgeon: Arta Silence, MD;  [facility];  Service: Endoscopy;  Laterality: N/A;  . FINE NEEDLE  ASPIRATION  11/08/2012   Procedure: FINE NEEDLE ASPIRATION (FNA) LINEAR;  Surgeon: Arta Silence, MD;  [facility];  Service: Endoscopy;  Laterality: N/A;  . HERNIA REPAIR     RIGHT  . LAPAROSCOPY  12/12/2012   Procedure: LAPAROSCOPY DIAGNOSTIC;  Surgeon: Stark Klein, MD;  [facility];  Service: General;  Laterality: N/A;  . Left Foot Surgery    . Left Foot Surgery    . VASECTOMY    . WHIPPLE PROCEDURE  12/12/2012   Procedure: WHIPPLE PROCEDURE;  Surgeon: Stark Klein, MD;  [facility];  Service: General;  Laterality: N/A;    There were no vitals filed for this visit.  Subjective Assessment - 05/06/18 1018    Patient is accompained by:  Family member daughter    Pertinent History  PMH: Ampullary Adenacarcinoma, DM2, HTN, tachycardia, A-fib    Limitations  no driving, 38/9 sup, no strenuous activity or lifting    Patient Stated Goals  be independent    Currently in Pain?  No/denies                   OT Treatments/Exercises (OP) - 05/06/18 0001      Exercises   Exercises  Hand      Hand Exercises   Other Hand Exercises  Pt issued putty HEP - See pt instructions for details. Pt issued red putty for home use      Visual/Perceptual Exercises  Letter Search  Pt reading letters from Lt to Rt side of page with min initial cueing to include letters on Rt side, and one error only d/t aphasia. Followed by letter cancellation 69M print size, with only min self corrected omissions on far Rt side (he initially missed, but corrected w/o prompts).     Other Exercises  Trail making test Part A with no difficulty. Trail making test Part B with one error and needed cues to get back to correct number/letter, but pt then able to complete. Pt demo good working memory and alternating attention, however did notice pt had more difficulty finding letters/numbers on far Rt side of page w/ multiple components (trail making part B only). Line by section test w/o difficulty or  errors. Symbols/digits test with only 1 error (95% accurate). Did notice pt off center somewhat when writing numbers in box space             OT Education - 05/06/18 1053    Education Details  Putty HEP for Rt hand    Person(s) Educated  Patient;Child(ren)    Methods  Explanation;Demonstration;Handout    Comprehension  Verbalized understanding;Returned demonstration       OT Short Term Goals - 05/06/18 1210      OT SHORT TERM GOAL #1   Title  Independent with visual HEP     Time  4    Period  Weeks    Status  New      OT SHORT TERM GOAL #2   Title  Independent with putty HEP for Rt hand    Time  4    Period  Weeks    Status  On-going      OT SHORT TERM GOAL #3   Title  Pt/family to verbalize understanding with visual compensatory strategies for Rt visual field cut    Time  4    Period  Weeks    Status  New      OT SHORT TERM GOAL #4   Title  Pt to perform simple snack prep and light household chores with supervision only    Time  4    Period  Weeks    Status  New      OT SHORT TERM GOAL #5   Title  Pt to perform environmental scanning with 75% accuracy or greater    Time  4    Period  Weeks    Status  New        OT Long Term Goals - 04/30/18 1321      OT LONG TERM GOAL #1   Title  Pt to improve grip strength Rt dominant hand to 60 lbs or greater    Baseline  45 lbs    Time  8    Period  Weeks    Status  New    Target Date  06/30/18      OT LONG TERM GOAL #2   Title  Pt to perform environmental scanning while performing simple physical task at 90% accuracy    Time  8    Period  Weeks    Status  New      OT LONG TERM GOAL #3   Title  Pt to return to cooking tasks with direct supervision only    Time  8    Period  Weeks    Status  New      OT LONG TERM GOAL #4   Title  Pt to return to home  management tasks with direct supervision only    Time  8    Period  Weeks    Status  New            Plan - 05/06/18 1210    Clinical Impression  Statement  Based on results from further testing today, pt appears to have cognition WFL's. Pt's deficits appear to be more visual, with aphasia (worse in second language - [place])    Occupational Profile and client history currently impacting functional performance  PMH: Ampullary adenacarcinoma, DM type 2, HTN, tachycardia, A-fib. 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[employer]    Occupational performance deficits (Please refer to evaluation for details):  ADL's;IADL's;Work;Leisure;Social Participation    Rehab Potential  Good    Current Impairments/barriers affecting progress:  visual deficits, aphasia    OT Frequency  2x / week    OT Duration  8 weeks    OT Treatment/Interventions  Self-care/ADL training;DME and/or AE instruction;Therapeutic activities;Therapeutic exercise;Cognitive remediation/compensation;Coping strategies training;Neuromuscular education;Functional Mobility Training;Passive range of motion;Visual/perceptual remediation/compensation;Manual Therapy;Patient/family education    Plan  issue visual HEP and compensatory strategies for Rt visual field cut, wider tabletop scanning (matching times). If time allows, environmental scanning    Consulted and Agree with Plan of Care  Patient;Family member/caregiver    Family Member Consulted  daughter       Patient will benefit from skilled therapeutic intervention in order to improve the following deficits and impairments:  Decreased coordination, Improper body mechanics, Decreased safety awareness, Decreased endurance, Decreased activity tolerance, Decreased knowledge of precautions, Impaired UE functional use, Decreased knowledge of use of DME, Decreased cognition, Decreased mobility, Decreased strength, Impaired perceived functional ability, Impaired vision/preception  Visit Diagnosis: Visuospatial deficit  Muscle weakness (generalized)    Problem List Patient Active Problem List   Diagnosis Date Noted   . DVT, lower extremity, distal, acute, right ([place]) 04/25/2018  . Acute lower UTI   . Atelectasis   . Hypoalbuminemia due to protein-calorie malnutrition ([place])   . Left temporal lobe hemorrhage ([place]) 04/15/2018  . Vascular headache   . Seizure prophylaxis   . Cerebral edema (HCC)   . Atrial fibrillation with rapid ventricular response ([place])   . Pneumonia   . Diabetes mellitus type 2 in nonobese (HCC)   . Benign essential HTN   . History of pancreatic cancer   . FUO (fever of unknown origin)   . Dysphagia, post-stroke   . Tachycardia   . Tachypnea   . Hypothyroidism   . Hypernatremia   . Hypokalemia   . Acute blood loss anemia   . Thrombocytopenia ([place])   . Intracranial hemorrhage ([place]) 04/05/2018  . Acute pancreatitis 06/11/2014  . Headache 06/11/2014  . 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 Ampullary carcinoma ([place]) 12/06/2012  . Obstructive jaundice 11/11/2012  . Biliary stricture 11/11/2012  . Diabetes mellitus ([place]) 11/10/2012  . HTN (hypertension) 11/10/2012  . Pancreatitis 11/10/2012  . Abdominal pain 11/10/2012    Carey Bullocks, OTR/L 05/06/2018, 12:13 PM  [facility] [address] [place], [place], [address] Phone: [phone]   Fax:  [phone]  Name: Hector Pugh MRN: [id number] Date of Birth: 1961-01-21

## 2018-05-06 NOTE — Patient Instructions (Signed)
  1. Grip Strengthening (Resistive Putty)   Squeeze putty using thumb and all fingers. Repeat _20___ times. Do __2__ sessions per day.   2. Roll putty into tube on table and pinch between first 2 fingers and thumb x 10 reps each. Do 2 sessions per day       

## 2018-05-09 ENCOUNTER — Encounter
Payer: Managed Care, Other (non HMO) | Attending: Physical Medicine & Rehabilitation | Admitting: Physical Medicine & Rehabilitation

## 2018-05-09 ENCOUNTER — Encounter: Payer: Self-pay | Admitting: Physical Medicine & Rehabilitation

## 2018-05-09 VITALS — BP 97/63 | HR 57 | Ht 66.0 in | Wt 146.0 lb

## 2018-05-09 DIAGNOSIS — E119 Type 2 diabetes mellitus without complications: Secondary | ICD-10-CM | POA: Diagnosis not present

## 2018-05-09 DIAGNOSIS — I824Z1 Acute embolism and thrombosis of unspecified deep veins of right distal lower extremity: Secondary | ICD-10-CM | POA: Diagnosis not present

## 2018-05-09 DIAGNOSIS — G8929 Other chronic pain: Secondary | ICD-10-CM | POA: Diagnosis not present

## 2018-05-09 DIAGNOSIS — R0989 Other specified symptoms and signs involving the circulatory and respiratory systems: Secondary | ICD-10-CM | POA: Diagnosis not present

## 2018-05-09 DIAGNOSIS — M545 Low back pain, unspecified: Secondary | ICD-10-CM

## 2018-05-09 DIAGNOSIS — I10 Essential (primary) hypertension: Secondary | ICD-10-CM

## 2018-05-09 DIAGNOSIS — I611 Nontraumatic intracerebral hemorrhage in hemisphere, cortical: Secondary | ICD-10-CM | POA: Diagnosis not present

## 2018-05-09 DIAGNOSIS — I4891 Unspecified atrial fibrillation: Secondary | ICD-10-CM

## 2018-05-09 DIAGNOSIS — I69120 Aphasia following nontraumatic intracerebral hemorrhage: Secondary | ICD-10-CM | POA: Insufficient documentation

## 2018-05-09 DIAGNOSIS — Z90411 Acquired partial absence of pancreas: Secondary | ICD-10-CM | POA: Diagnosis not present

## 2018-05-09 DIAGNOSIS — Z8507 Personal history of malignant neoplasm of pancreas: Secondary | ICD-10-CM

## 2018-05-09 DIAGNOSIS — I69151 Hemiplegia and hemiparesis following nontraumatic intracerebral hemorrhage affecting right dominant side: Secondary | ICD-10-CM | POA: Insufficient documentation

## 2018-05-09 MED ORDER — METHOCARBAMOL 500 MG PO TABS: 500.0000 mg | ORAL_TABLET | Freq: Two times a day (BID) | ORAL | 1 refills | Status: DC | PRN

## 2018-05-09 NOTE — Progress Notes (Addendum)
Subjective:    Patient ID: Hector Pugh, male    DOB: [birth year], 59 y.o.   MRN: [id number]  HPI 57 year old male with history of pancreatic cancer, T2DM, HTN presents for transitional care management after receiving CIR for left temporal lobe hemorrhage.   Admit date: 04/15/2018 Discharge date: 04/27/2018  Daughter supplements history. Since discharge, his headaches have resolved. BP is controlled.  He saw PCP.  His CBGs have been relatively controlled.  He notes some mild back pain.  Denies falls, fevers. He saw Neurosurgery and follow up imaging ordered.  Therapies: Outpt 2-3/week DME: Shower chair Mobility: No assistive device needed  Pain Inventory Average Pain 0 Pain Right Now 4 My pain is aching  In the last 24 hours, has pain interfered with the following? General activity 0 Relation with others 0 Enjoyment of life 0 What TIME of day is your pain at its worst? na Sleep (in general) Fair  Pain is worse with: na Pain improves with: na Relief from Meds: na  Mobility walk without assistance ability to climb steps?  yes do you drive?  no  Function employed # of hrs/week 40 I need assistance with the following:  meal prep, household duties and shopping  Neuro/Psych depression  Prior Studies Any changes since last visit?  no  Physicians involved in your care Any changes since last visit?  no   Family History  Problem Relation Age of Onset  . Cancer Mother        esophageal carcinoma   Social History   Socioeconomic History  . Marital status: Married    Spouse name: Not on file  . Number of children: Not on file  . Years of education: Not on file  . Highest education level: Not on file  Occupational History  . Not on file  Social Needs  . Financial resource strain: Not on file  . Food insecurity:    Worry: Not on file    Inability: Not on file  . Transportation needs:    Medical: Not on file    Non-medical: Not on file  Tobacco Use  .  Smoking status: Never Smoker  . Smokeless tobacco: Never Used  Substance and Sexual Activity  . Alcohol use: No  . Drug use: No  . Sexual activity: Never  Lifestyle  . Physical activity:    Days per week: Not on file    Minutes per session: Not on file  . Stress: Not on file  Relationships  . Social connections:    Talks on phone: Not on file    Gets together: Not on file    Attends religious service: Not on file    Active member of club or organization: Not on file    Attends meetings of clubs or organizations: Not on file    Relationship status: Not on file  Other Topics Concern  . Not on file  Social History Narrative  . Not on file   Past Surgical History:  Procedure Laterality Date  . circucision    . circumsision    . COLONOSCOPY N/A 04/04/2018   Procedure: COLONOSCOPY;  Surgeon: Leighton Ruff, MD;  [facility];  Service: Endoscopy;  Laterality: N/A;  . CRANIOTOMY Left 04/05/2018   Procedure: Left Temporal CRANIOTOMY HEMATOMA EVACUATION of Intracranial Hemorrhage;  Surgeon: Ashok Pall, MD;  Location: [facility];  Service: Neurosurgery;  Laterality: Left;  . ERCP  11/08/2012   Procedure: ENDOSCOPIC RETROGRADE CHOLANGIOPANCREATOGRAPHY (ERCP);  Surgeon: Jeryl Columbia,  MD;  [facility];  Service: Gastroenterology;  Laterality: N/A;  . EUS  11/08/2012   Procedure: UPPER ENDOSCOPIC ULTRASOUND (EUS) LINEAR;  Surgeon: Arta Silence, MD;  [facility];  Service: Endoscopy;  Laterality: N/A;  . FINE NEEDLE ASPIRATION  11/08/2012   Procedure: FINE NEEDLE ASPIRATION (FNA) LINEAR;  Surgeon: Arta Silence, MD;  [facility];  Service: Endoscopy;  Laterality: N/A;  . HERNIA REPAIR     RIGHT  . LAPAROSCOPY  12/12/2012   Procedure: LAPAROSCOPY DIAGNOSTIC;  Surgeon: Stark Klein, MD;  [facility];  Service: General;  Laterality: N/A;  . Left Foot Surgery    . Left Foot Surgery    . VASECTOMY    . WHIPPLE PROCEDURE  12/12/2012   Procedure: WHIPPLE  PROCEDURE;  Surgeon: Stark Klein, MD;  [facility];  Service: General;  Laterality: N/A;   Past Medical History:  Diagnosis Date  . 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 Cancer ([place])   . Diabetes mellitus ([place]) 11/10/2012  . Diabetes mellitus without complication (HCC)    diet controlled  . HTN (hypertension) 11/10/2012  . Hypertension   . 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 Pancreatic cancer ([place])   . Pancreatitis 11/10/2012  . Seasonal allergies    BP 97/63   Pulse (!) 57   Ht 5\' 6"  (1.676 m)   Wt 146 lb (66.2 kg)   SpO2 99%   BMI 23.57 kg/m   Opioid Risk Score:   Fall Risk Score:  `1  Depression screen PHQ 2/9  No flowsheet data found.   Review of Systems  Constitutional: Negative.   HENT: Negative.   Eyes: Negative.   Respiratory: Negative.   Cardiovascular: Negative.   Gastrointestinal: Negative.   Endocrine: Negative.   Genitourinary: Negative.   Musculoskeletal: Negative.   Skin: Negative.   Allergic/Immunologic: Negative.   Neurological: Positive for speech difficulty. Negative for seizures, weakness and headaches.  Hematological: Negative.   Psychiatric/Behavioral: Positive for dysphoric mood.  All other systems reviewed and are negative.     Objective:   Physical Exam Constitutional: No distress . Vital signs reviewed. HENT: Normocephalic.  Craniotomy incision C/D/I Eyes: EOMI. No discharge. Cardiovascular: RRR. No JVD. Respiratory: CTA Bilaterally. Normal effort. GI: BS +. Non-distended. Musc: No edema. Neurological: He is alert.  Expressive> Receptive aphasia Right inattention Right facial weakness with minimal dysarthria. Motor: LUE/LLE: 5/5 proximal to distal RUE: 4+-5/5 proximal to distal  RLE: 4+/5 proximal to distal  Skin: See above  Psychiatric: Appears to have normal behavior and normal affect.    Assessment & Plan:  57 year old male with history of pancreatic cancer, T2DM, HTN  presents for transitional care management after receiving CIR for left temporal lobe hemorrhage.   1. 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 DVT right gastroc and soleal veins  Cont eliquis   3. Pain Management:   Controlled at present  4. T2DM s/p [place] for pancreatic cancer:   Cont meds  Follow up with PCP  5. A fib with RVR:   Cont follow up with PCP  6. Blood pressure  Relatively low at present, but daughter notes low values since hospitalization/meds  Asymptomatic  7. Chronic low back pain  Likely due to increase in activity  Robaxin 500 BID  PRN ordered  Meds reviewed Referrals reviewed All questions answered

## 2018-05-10 ENCOUNTER — Ambulatory Visit: Payer: Managed Care, Other (non HMO) | Admitting: Rehabilitation

## 2018-05-10 ENCOUNTER — Telehealth: Payer: Self-pay | Admitting: *Deleted

## 2018-05-10 ENCOUNTER — Encounter: Payer: Self-pay | Admitting: Rehabilitation

## 2018-05-10 ENCOUNTER — Ambulatory Visit: Payer: Managed Care, Other (non HMO)

## 2018-05-10 DIAGNOSIS — R2689 Other abnormalities of gait and mobility: Secondary | ICD-10-CM

## 2018-05-10 DIAGNOSIS — R2681 Unsteadiness on feet: Secondary | ICD-10-CM

## 2018-05-10 DIAGNOSIS — R41842 Visuospatial deficit: Secondary | ICD-10-CM | POA: Diagnosis not present

## 2018-05-10 DIAGNOSIS — M6281 Muscle weakness (generalized): Secondary | ICD-10-CM

## 2018-05-10 NOTE — Telephone Encounter (Signed)
Ms Hector Pugh called about Hector Pugh's speech therapy being denied by insurance because they had not received clinical information as to why therapy is indicated. All appts have been cancelled.  She is requesting that this be addressed so he can continue with his therapy. (he only had one visit). Please advise.

## 2018-05-10 NOTE — Patient Instructions (Addendum)
Stand in corner with chair in front of you for support.    Feet Apart (Compliant Surface) Head Motion - Eyes Closed    Stand on compliant surface: ___pillow or cushion_____ with feet shoulder width apart. Close eyes and move head slowly, up and down x 10 reps, side to side x 10 reps, and diagonally in each direction (up right, down left and up left down right) x 10 reps each Repeat __1__ times per session. Do __1-2__ sessions per day.  Copyright  VHI. All rights reserved.   Feet Together (Compliant Surface) Varied Arm Positions - Eyes Closed    Stand on compliant surface: ___pillow/cushion_____ with feet together and arms by your side. Close eyes and visualize upright position. Hold__20__ seconds. Repeat __3__ times per session. Do __1-2__ sessions per day.  Copyright  VHI. All rights reserved.     FUNCTIONAL MOBILITY: Marching - Standing    March forward along countertop (for support if you need it) by lifting left leg up, then right. Alternate. Do x 8-10 feet and repeat x 3 reps.     Copyright  VHI. All rights reserved.   Tandem Walking    Do beside counter top for support if needed. Walk with each foot directly in front of other, heel of one foot touching toes of other foot with each step. Both feet straight ahead. Do 8-10 feet x 3 reps.    Copyright  VHI. All rights reserved.

## 2018-05-10 NOTE — Telephone Encounter (Signed)
I have left a VM for Lennart Pall MSW to address since the auth occurred while inpt rehab. I will let Wilburn Keir know it is being addressed.

## 2018-05-10 NOTE — Telephone Encounter (Signed)
I am not sure what they are looking.  My documentation along with discharge documentation and recommendations from inpatient SLP suggest aphasia and dysarthria.  If there is something specific they need, please let me, however, this has never been the case. Thanks.

## 2018-05-11 NOTE — Therapy (Signed)
[facility] [address] [place], [place], [address] Phone: [phone]   Fax:  [phone]  Physical Therapy Treatment  Patient Details  Name: Hector Pugh MRN: [id number] Date of Birth: [birth year] Referring Provider: Dr. Delice Lesch   Encounter Date: 05/10/2018  PT End of Session - 05/11/18 0804    Visit Number  2    Number of Visits  9    Date for PT Re-Evaluation  06/02/18    Authorization Type  [employer]    PT Start Time  1447    PT Stop Time  1531    PT Time Calculation (min)  44 min    Activity Tolerance  Patient tolerated treatment well    Behavior During Therapy  [facility] for tasks assessed/performed       Past Medical History:  Diagnosis Date  . 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 Cancer ([place])   . Diabetes mellitus ([place]) 11/10/2012  . Diabetes mellitus without complication (HCC)    diet controlled  . HTN (hypertension) 11/10/2012  . Hypertension   . 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 Pancreatic cancer ([place])   . Pancreatitis 11/10/2012  . Seasonal allergies     Past Surgical History:  Procedure Laterality Date  . circucision    . circumsision    . COLONOSCOPY N/A 04/04/2018   Procedure: COLONOSCOPY;  Surgeon: Leighton Ruff, MD;  [facility];  Service: Endoscopy;  Laterality: N/A;  . CRANIOTOMY Left 04/05/2018   Procedure: Left Temporal CRANIOTOMY HEMATOMA EVACUATION of Intracranial Hemorrhage;  Surgeon: Ashok Pall, MD;  Location: [facility];  Service: Neurosurgery;  Laterality: Left;  . ERCP  11/08/2012   Procedure: ENDOSCOPIC RETROGRADE CHOLANGIOPANCREATOGRAPHY (ERCP);  Surgeon: Jeryl Columbia, MD;  [facility];  Service: Gastroenterology;  Laterality: N/A;  . EUS  11/08/2012   Procedure: UPPER ENDOSCOPIC ULTRASOUND (EUS) LINEAR;  Surgeon: Arta Silence, MD;  [facility];  Service: Endoscopy;  Laterality: N/A;  . FINE NEEDLE  ASPIRATION  11/08/2012   Procedure: FINE NEEDLE ASPIRATION (FNA) LINEAR;  Surgeon: Arta Silence, MD;  [facility];  Service: Endoscopy;  Laterality: N/A;  . HERNIA REPAIR     RIGHT  . LAPAROSCOPY  12/12/2012   Procedure: LAPAROSCOPY DIAGNOSTIC;  Surgeon: Stark Klein, MD;  [facility];  Service: General;  Laterality: N/A;  . Left Foot Surgery    . Left Foot Surgery    . VASECTOMY    . WHIPPLE PROCEDURE  12/12/2012   Procedure: WHIPPLE PROCEDURE;  Surgeon: Stark Klein, MD;  [facility];  Service: General;  Laterality: N/A;    There were no vitals filed for this visit.  Subjective Assessment - 05/10/18 1452    Subjective  Pt reports he is doing well.  No changes since last visit.     Patient is accompained by:  Family member Pushpa (pernounced pooshpa)    Limitations  Walking    How long can you walk comfortably?  Has been able to ambulate in stores without issue.      Patient Stated Goals  "To move as best I can."     Currently in Pain?  No/denies         [facility] PT Assessment - 05/10/18 1453      6 Minute Walk- Baseline   6 Minute Walk- Baseline  yes    BP (mmHg)  114/68    HR (bpm)  58  02 Sat (%RA)  100 %    Modified Borg Scale for Dyspnea  0- Nothing at all    Perceived Rate of Exertion (Borg)  6-      6 Minute walk- Post Test   6 Minute Walk Post Test  yes    BP (mmHg)  119/66    HR (bpm)  83    02 Sat (%RA)  100 %    Modified Borg Scale for Dyspnea  0- Nothing at all    Perceived Rate of Exertion (Borg)  7- Very, very light      6 minute walk test results    Aerobic Endurance Distance Walked  1655    Endurance additional comments  Discussed walking program at home based on these results.  He is very close to normal distance for his age group, however wife reports he walked faster during test than he noramlly does when out walking in neighborhood.  Provided education for him to increase gait speed in order to better address endurance at home along  with ensuring he is scanning environment rather than just looking straight ahead.          Stand in corner with chair in front of you for support.    Feet Apart (Compliant Surface) Head Motion - Eyes Closed    Stand on compliant surface: ___pillow or cushion_____ with feet shoulder width apart. Close eyes and move head slowly, up and down x 10 reps, side to side x 10 reps, and diagonally in each direction (up right, down left and up left down right) x 10 reps each Repeat __1__ times per session. Do __1-2__ sessions per day.  Copyright  VHI. All rights reserved.   Feet Together (Compliant Surface) Varied Arm Positions - Eyes Closed    Stand on compliant surface: ___pillow/cushion_____ with feet together and arms by your side. Close eyes and visualize upright position. Hold__20__ seconds. Repeat __3__ times per session. Do __1-2__ sessions per day.  Copyright  VHI. All rights reserved.     FUNCTIONAL MOBILITY: Marching - Standing    March forward along countertop (for support if you need it) by lifting left leg up, then right. Alternate. Do x 8-10 feet and repeat x 3 reps.     Copyright  VHI. All rights reserved.   Tandem Walking    Do beside counter top for support if needed. Walk with each foot directly in front of other, heel of one foot touching toes of other foot with each step. Both feet straight ahead. Do 8-10 feet x 3 reps.    Copyright  VHI. All rights reserved.                     PT Education - 05/11/18 0804    Education Details  Education on walking program, HEP for balance    Person(s) Educated  Patient;Spouse    Methods  Explanation;Demonstration;Handout    Comprehension  Verbalized understanding;Returned demonstration       PT Short Term Goals - 05/03/18 1313      PT SHORT TERM GOAL #1   Title  =LTGs        PT Long Term Goals - 05/03/18 1313      PT LONG TERM GOAL #1   Title  Pt will be independent with final HEP in  order to indicate decreased fall risk and improved functional mobility.  (Target date: 06/02/18)    Time  4    Period  Weeks  Status  New    Target Date  06/02/18      PT LONG TERM GOAL #2   Title  Pt will improve FGA to >/=28/30 in order to indicate decreased fall risk.      Time  4    Period  Weeks    Status  New      PT LONG TERM GOAL #3   Title  Pt will perform floor to stand tranfser without support and with no report of dizziness/light headedness in order to return to preferred prayer position.      Time  4    Period  Weeks    Status  New      PT LONG TERM GOAL #4   Title  Pt will ambulate >1000' over varying outdoor surfaces while engaging in dual task without overt LOB and with increased attention to R environment in order to return to community and leisure activity.     Time  4    Period  Weeks    Status  New            Plan - 05/11/18 0805    Clinical Impression Statement  Skilled session focused on formal assessment of endurance with 6MWT.  Pt is very close to normal for his age group (ambulated distance of [place] but will continue to address as needed.  Also provided pt with initial HEP for balance.      Rehab Potential  Good    Clinical Impairments Affecting Rehab Potential  R inattention and R field cut    PT Frequency  2x / week    PT Duration  4 weeks    PT Treatment/Interventions  ADLs/Self Care Home Management;Gait training;Stair training;Functional mobility training;Therapeutic activities;Therapeutic exercise;Balance training;Neuromuscular re-education;Cognitive remediation;Patient/family education;Energy conservation    PT Next Visit Plan  Add to HEP as needed, dual tasking, outdoor gait/balance tasks, floor to stand transfer to simulate prayer position    Consulted and Agree with Plan of Care  Patient;Family member/caregiver    Family Member Consulted  daughter       Patient will benefit from skilled therapeutic intervention in order to improve the  following deficits and impairments:  Decreased activity tolerance, Decreased balance, Decreased safety awareness, Decreased coordination, Impaired vision/preception  Visit Diagnosis: Muscle weakness (generalized)  Unsteadiness on feet  Other abnormalities of gait and mobility     Problem List Patient Active Problem List   Diagnosis Date Noted  . DVT, lower extremity, distal, acute, right ([place]) 04/25/2018  . Acute lower UTI   . Atelectasis   . Hypoalbuminemia due to protein-calorie malnutrition ([place])   . Left temporal lobe hemorrhage ([place]) 04/15/2018  . Vascular headache   . Seizure prophylaxis   . Cerebral edema (HCC)   . Atrial fibrillation with rapid ventricular response ([place])   . Pneumonia   . Diabetes mellitus type 2 in nonobese (HCC)   . Benign essential HTN   . History of pancreatic cancer   . FUO (fever of unknown origin)   . Dysphagia, post-stroke   . Tachycardia   . Tachypnea   . Hypothyroidism   . Hypernatremia   . Hypokalemia   . Acute blood loss anemia   . Thrombocytopenia ([place])   . Intracranial hemorrhage ([place]) 04/05/2018  . Acute pancreatitis 06/11/2014  . Headache 06/11/2014  . 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 Obstructive jaundice 11/11/2012  . Biliary stricture 11/11/2012  . Diabetes mellitus ([place]) 11/10/2012  .  HTN (hypertension) 11/10/2012  . Pancreatitis 11/10/2012  . Abdominal pain 11/10/2012    Cameron Sprang, PT, MPT [facility] [address] [place], [place], [address] Phone: [phone]   Fax:  [phone] 05/11/18, 8:09 AM  Name: Hector Pugh MRN: [id number] Date of Birth: 1961/01/29

## 2018-05-13 ENCOUNTER — Other Ambulatory Visit: Payer: Self-pay

## 2018-05-13 NOTE — Patient Outreach (Signed)
[facility]) Care Management  05/13/2018  Hector Pugh 28-May-1961 [id number]  EMMI: stroke red alert Referral date: 05/10/18 Referral reason: questions/ problems with medications Insurance: [employer] Day # 9 Attempt #1  Telephone call to patient regarding EMMI stroke red alert. HIPAA compliant voice message left with call back phone number.   PLAN: RNCM will attempt 2nd telephone call to patient within 4 business days.   Quinn Plowman RN,BSN,CCM [facility] Telephonic  [phone]

## 2018-05-14 ENCOUNTER — Ambulatory Visit: Payer: Managed Care, Other (non HMO) | Admitting: Occupational Therapy

## 2018-05-14 ENCOUNTER — Encounter: Payer: Self-pay | Admitting: Occupational Therapy

## 2018-05-14 DIAGNOSIS — R414 Neurologic neglect syndrome: Secondary | ICD-10-CM

## 2018-05-14 DIAGNOSIS — R41840 Attention and concentration deficit: Secondary | ICD-10-CM

## 2018-05-14 DIAGNOSIS — R278 Other lack of coordination: Secondary | ICD-10-CM

## 2018-05-14 DIAGNOSIS — R41842 Visuospatial deficit: Secondary | ICD-10-CM | POA: Diagnosis not present

## 2018-05-14 NOTE — Patient Instructions (Signed)
1. Look for the edge of objects particularly the right side,  so that you make sure you are seeing all of an object 2. Turn your head when walking, scan from side to side, particularly in busy environments 3. Use an organized scanning pattern. It's usually easier to scan from top to bottom, and left to right (like you are reading) 4. Double check yourself 5. Use a line guide (like a blank piece of paper) or your finger when reading 6. If necessary, place brightly colored tape, or mark  at end of table or work area to the right as a reminder to always look until you see the tape.    Activities to try at home to encourage visual scanning:   1. Word searches 2. Mazes 3. Puzzles 4. Card games 5. Computer games and/or searches 6. Connect-the-dots  Activities for environmental (larger) scanning:  When you enter a new environment, stop and look from left to right before moving forward. 1. With supervision, scan for items in grocery store or drugstore.  Begin with a familiar store, then progress to a new store you've never been in before. Make sure you have supervision with this.  2. Have a family member with your whenever you are crossing the street or a parking lot for safety

## 2018-05-14 NOTE — Therapy (Signed)
[facility] [address] [place], [place], [address] Phone: [phone]   Fax:  [phone]  Occupational Therapy Treatment  Patient Details  Name: Hector Pugh MRN: [id number] Date of Birth: 06/17/1961 Referring Provider: Dr. Delice Lesch   Encounter Date: 05/14/2018  OT End of Session - 05/14/18 1057    Visit Number  3    Number of Visits  17    Date for OT Re-Evaluation  06/30/18    Authorization Type  Cigna    OT Start Time  1018    OT Stop Time  1100    OT Time Calculation (min)  42 min    Behavior During Therapy  [facility] for tasks assessed/performed       Past Medical History:  Diagnosis Date  . 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 Cancer ([place])   . Diabetes mellitus ([place]) 11/10/2012  . Diabetes mellitus without complication (HCC)    diet controlled  . HTN (hypertension) 11/10/2012  . Hypertension   . 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 Pancreatic cancer ([place])   . Pancreatitis 11/10/2012  . Seasonal allergies     Past Surgical History:  Procedure Laterality Date  . circucision    . circumsision    . COLONOSCOPY N/A 04/04/2018   Procedure: COLONOSCOPY;  Surgeon: Leighton Ruff, MD;  [facility];  Service: Endoscopy;  Laterality: N/A;  . CRANIOTOMY Left 04/05/2018   Procedure: Left Temporal CRANIOTOMY HEMATOMA EVACUATION of Intracranial Hemorrhage;  Surgeon: Ashok Pall, MD;  Location: [facility];  Service: Neurosurgery;  Laterality: Left;  . ERCP  11/08/2012   Procedure: ENDOSCOPIC RETROGRADE CHOLANGIOPANCREATOGRAPHY (ERCP);  Surgeon: Jeryl Columbia, MD;  [facility];  Service: Gastroenterology;  Laterality: N/A;  . EUS  11/08/2012   Procedure: UPPER ENDOSCOPIC ULTRASOUND (EUS) LINEAR;  Surgeon: Arta Silence, MD;  [facility];  Service: Endoscopy;  Laterality: N/A;  . FINE NEEDLE ASPIRATION  11/08/2012   Procedure: FINE NEEDLE ASPIRATION  (FNA) LINEAR;  Surgeon: Arta Silence, MD;  [facility];  Service: Endoscopy;  Laterality: N/A;  . HERNIA REPAIR     RIGHT  . LAPAROSCOPY  12/12/2012   Procedure: LAPAROSCOPY DIAGNOSTIC;  Surgeon: Stark Klein, MD;  [facility];  Service: General;  Laterality: N/A;  . Left Foot Surgery    . Left Foot Surgery    . VASECTOMY    . WHIPPLE PROCEDURE  12/12/2012   Procedure: WHIPPLE PROCEDURE;  Surgeon: Stark Klein, MD;  [facility];  Service: General;  Laterality: N/A;    There were no vitals filed for this visit.  Subjective Assessment - 05/14/18 1022    Patient Stated Goals  be independent    Currently in Pain?  No/denies          Treatment: Environmental scanning, pt only missed 1 item during environmental scanning. He slowed down and took his time. Education performed regarding compensatory strategies for visual impairment, pt/ dtr verbalized understanding. Tabletop number cancellation 100% accuracy. Completing a 12 piece puzzle, min v.c for strategies.                   OT Short Term Goals - 05/06/18 1210      OT SHORT TERM GOAL #1   Title  Independent with visual HEP     Time  4    Period  Weeks    Status  New      OT  SHORT TERM GOAL #2   Title  Independent with putty HEP for Rt hand    Time  4    Period  Weeks    Status  On-going      OT SHORT TERM GOAL #3   Title  Pt/family to verbalize understanding with visual compensatory strategies for Rt visual field cut    Time  4    Period  Weeks    Status  New      OT SHORT TERM GOAL #4   Title  Pt to perform simple snack prep and light household chores with supervision only    Time  4    Period  Weeks    Status  New      OT SHORT TERM GOAL #5   Title  Pt to perform environmental scanning with 75% accuracy or greater    Time  4    Period  Weeks    Status  New        OT Long Term Goals - 04/30/18 1321      OT LONG TERM GOAL #1   Title  Pt to improve grip strength Rt  dominant hand to 60 lbs or greater    Baseline  45 lbs    Time  8    Period  Weeks    Status  New    Target Date  06/30/18      OT LONG TERM GOAL #2   Title  Pt to perform environmental scanning while performing simple physical task at 90% accuracy    Time  8    Period  Weeks    Status  New      OT LONG TERM GOAL #3   Title  Pt to return to cooking tasks with direct supervision only    Time  8    Period  Weeks    Status  New      OT LONG TERM GOAL #4   Title  Pt to return to home management tasks with direct supervision only    Time  8    Period  Weeks    Status  New            Plan - 05/14/18 1718    Clinical Impression Statement  Pt isprogressing towards goals. Pt/ dtr verbalize understanding of compensatory strategies for visual deficits.    Occupational performance deficits (Please refer to evaluation for details):  ADL's;IADL's;Work;Leisure;Social Participation    Rehab Potential  Good    Current Impairments/barriers affecting progress:  visual deficits, aphasia    OT Frequency  2x / week    OT Duration  8 weeks    OT Treatment/Interventions  Self-care/ADL training;DME and/or AE instruction;Therapeutic activities;Therapeutic exercise;Cognitive remediation/compensation;Coping strategies training;Neuromuscular education;Functional Mobility Training;Passive range of motion;Visual/perceptual remediation/compensation;Manual Therapy;Patient/family education    Plan  wider tabletop scanning, environmental scanning    Consulted and Agree with Plan of Care  Patient;Family member/caregiver    Family Member Consulted  daughter       Patient will benefit from skilled therapeutic intervention in order to improve the following deficits and impairments:  Decreased coordination, Improper body mechanics, Decreased safety awareness, Decreased endurance, Decreased activity tolerance, Decreased knowledge of precautions, Impaired UE functional use, Decreased knowledge of use of DME,  Decreased cognition, Decreased mobility, Decreased strength, Impaired perceived functional ability, Impaired vision/preception  Visit Diagnosis: Visuospatial deficit  Other lack of coordination  Neurologic neglect syndrome  Attention and concentration deficit    Problem List Patient Active  Problem List   Diagnosis Date Noted  . DVT, lower extremity, distal, acute, right ([place]) 04/25/2018  . Acute lower UTI   . Atelectasis   . Hypoalbuminemia due to protein-calorie malnutrition ([place])   . Left temporal lobe hemorrhage ([place]) 04/15/2018  . Vascular headache   . Seizure prophylaxis   . Cerebral edema (HCC)   . Atrial fibrillation with rapid ventricular response ([place])   . Pneumonia   . Diabetes mellitus type 2 in nonobese (HCC)   . Benign essential HTN   . History of pancreatic cancer   . FUO (fever of unknown origin)   . Dysphagia, post-stroke   . Tachycardia   . Tachypnea   . Hypothyroidism   . Hypernatremia   . Hypokalemia   . Acute blood loss anemia   . Thrombocytopenia ([place])   . Intracranial hemorrhage ([place]) 04/05/2018  . Acute pancreatitis 06/11/2014  . Headache 06/11/2014  . 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 Obstructive jaundice 11/11/2012  . Biliary stricture 11/11/2012  . Diabetes mellitus ([place]) 11/10/2012  . HTN (hypertension) 11/10/2012  . Pancreatitis 11/10/2012  . Abdominal pain 11/10/2012    Ottis Vacha 05/14/2018, 5:21 PM  [facility] [address] [place], [place], [address] Phone: [phone]   Fax:  [phone]  Name: Samik Balkcom MRN: [id number] Date of Birth: 08-27-1961

## 2018-05-15 ENCOUNTER — Other Ambulatory Visit: Payer: Self-pay

## 2018-05-15 NOTE — Patient Outreach (Signed)
[facility]) Care Management  05/15/2018  Shean Gerding 01/20/1961 [id number]   EMMI: stroke red alert Referral date: 05/10/18 Referral reason: questions/ problems with medications Insurance: [employer] Day # 9 Attempt #2  Second telephone call to patient regarding EMMI stroke red alert. HIPAA compliant voice message left with call back phone number.   PLAN: RNCM will attempt 3rd telephone call to patient within 4 business days.   Quinn Plowman RN,BSN,CCM [facility] Telephonic  [phone]

## 2018-05-17 ENCOUNTER — Ambulatory Visit: Payer: Managed Care, Other (non HMO) | Admitting: Rehabilitation

## 2018-05-17 ENCOUNTER — Ambulatory Visit: Payer: Managed Care, Other (non HMO) | Admitting: Occupational Therapy

## 2018-05-17 ENCOUNTER — Encounter: Payer: Self-pay | Admitting: Rehabilitation

## 2018-05-17 DIAGNOSIS — R278 Other lack of coordination: Secondary | ICD-10-CM

## 2018-05-17 DIAGNOSIS — R2689 Other abnormalities of gait and mobility: Secondary | ICD-10-CM

## 2018-05-17 DIAGNOSIS — R2681 Unsteadiness on feet: Secondary | ICD-10-CM

## 2018-05-17 DIAGNOSIS — R41842 Visuospatial deficit: Secondary | ICD-10-CM | POA: Diagnosis not present

## 2018-05-17 DIAGNOSIS — R414 Neurologic neglect syndrome: Secondary | ICD-10-CM

## 2018-05-17 DIAGNOSIS — M6281 Muscle weakness (generalized): Secondary | ICD-10-CM

## 2018-05-17 DIAGNOSIS — R41840 Attention and concentration deficit: Secondary | ICD-10-CM

## 2018-05-17 NOTE — Patient Instructions (Signed)
  Strengthening: Resisted Flexion   Hold tubing with _each____ arm(s) at side. Pull forward and up. Move shoulder through pain-free range of motion. Repeat __10__ times per set.  Do _1-2_ sessions per day , every other day   Strengthening: Resisted Extension   Hold tubing in __each___ hand(s), arm forward. Pull arm back, elbow straight. Repeat _10___ times per set. Do _1-2___ sessions per day, every other day.   Resisted Horizontal Abduction: Bilateral   Sit or stand, tubing in both hands, arms out in front. Keeping arms straight, pinch shoulder blades together and stretch arms out. Repeat _10___ times per set. Do _1-2___ sessions per day, every other day.   Elbow Flexion: Resisted   With tubing held in _each_____ hand(s) and other end secured under foot, curl arm up as far as possible. Repeat _10___ times per set. Do _1-2___ sessions per day, every other day.    Elbow Extension: Resisted   Sit in chair with resistive band secured at armrest (or hold with other hand) and ____each___ elbow bent. Straighten elbow. Repeat _10___ times per set.  Do _1-2___ sessions per day, every other day.   Copyright  VHI. All rights reserved.

## 2018-05-17 NOTE — Patient Instructions (Addendum)
   Start in standing position (if you are alone, do beside a couch for support if you need it).  Go into half kneeling position as below:      Then bring legs side by side (so that you are on both knees) and then return to this position (above) and back to stand.  Do each side x 5 reps.     Squat: Double Leg (Supported)    With feet shoulder width apart, hold support. Squat, keeping lower leg vertical, knee in line with second toe. Use legs, do not pull up and down with arms.  Place chair behind you and think about aiming your bottom for the chair.  Keep feet pointed forward and knees going straight ahead not out to the side.  Repeat _10__ times per set. Rest _30__ seconds after set. Do _2 sets per session.  http://plyo.exer.us/74   Copyright  VHI. All rights reserved.    Other things to do at home:  Work on walking (in neighborhood or in yard for more challenge) while tossing ball (bean bag, etc anything small that he can toss) and try and add another cognitive task like naming foods, animals, etc (something that he is familiar with).  Can also have him kick ball in yard (switching feet intermittently) again while maintaining conversation or doing another cognitive task.

## 2018-05-17 NOTE — Therapy (Signed)
[facility] [address] [place], [place], [address] Phone: [phone]   Fax:  [phone]  Occupational Therapy Treatment  Patient Details  Name: Hector Pugh MRN: [id number] Date of Birth: 05-01-1961 Referring Provider: Dr. Delice Lesch   Encounter Date: 05/17/2018  OT End of Session - 05/17/18 1016    Visit Number  4    Number of Visits  17    Date for OT Re-Evaluation  06/30/18    Authorization Type  [employer]    OT Start Time  [phone]    OT Stop Time  1017    OT Time Calculation (min)  43 min    Activity Tolerance  Patient tolerated treatment well    Behavior During Therapy  [facility] for tasks assessed/performed       Past Medical History:  Diagnosis Date  . 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 Cancer ([place])   . Diabetes mellitus ([place]) 11/10/2012  . Diabetes mellitus without complication (HCC)    diet controlled  . HTN (hypertension) 11/10/2012  . Hypertension   . 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 Pancreatic cancer ([place])   . Pancreatitis 11/10/2012  . Seasonal allergies     Past Surgical History:  Procedure Laterality Date  . circucision    . circumsision    . COLONOSCOPY N/A 04/04/2018   Procedure: COLONOSCOPY;  Surgeon: Leighton Ruff, MD;  Location: [facility];  Service: Endoscopy;  Laterality: N/A;  . CRANIOTOMY Left 04/05/2018   Procedure: Left Temporal CRANIOTOMY HEMATOMA EVACUATION of Intracranial Hemorrhage;  Surgeon: Ashok Pall, MD;  Location: [place];  Service: Neurosurgery;  Laterality: Left;  . ERCP  11/08/2012   Procedure: ENDOSCOPIC RETROGRADE CHOLANGIOPANCREATOGRAPHY (ERCP);  Surgeon: Jeryl Columbia, MD;  Location: [facility];  Service: Gastroenterology;  Laterality: N/A;  . EUS  11/08/2012   Procedure: UPPER ENDOSCOPIC ULTRASOUND (EUS) LINEAR;  Surgeon: Arta Silence, MD;  [facility];  Service: Endoscopy;  Laterality: N/A;  . FINE NEEDLE  ASPIRATION  11/08/2012   Procedure: FINE NEEDLE ASPIRATION (FNA) LINEAR;  Surgeon: Arta Silence, MD;  [facility];  Service: Endoscopy;  Laterality: N/A;  . HERNIA REPAIR     RIGHT  . LAPAROSCOPY  12/12/2012   Procedure: LAPAROSCOPY DIAGNOSTIC;  Surgeon: Stark Klein, MD;  Location: [facility];  Service: General;  Laterality: N/A;  . Left Foot Surgery    . Left Foot Surgery    . VASECTOMY    . WHIPPLE PROCEDURE  12/12/2012   Procedure: WHIPPLE PROCEDURE;  Surgeon: Stark Klein, MD;  [facility];  Service: General;  Laterality: N/A;    There were no vitals filed for this visit.  Subjective Assessment - 05/17/18 1634    Pertinent History  PMH: Ampullary Adenacarcinoma, DM2, HTN, tachycardia, A-fib    Patient Stated Goals  be independent    Currently in Pain?  No/denies             Treatment:  wider field of view tabletop scanning to match clock faces with correct time, min v.c to use organized scan pattern, increased time locating items to right side. Arm bike x 5 mins level 1 for conditioning              OT Education - 05/17/18 1015    Education Details  Red theraband 10-15 reps , min v.c and demonstration    Person(s) Educated  Patient;Child(ren)    Methods  Explanation;Verbal cues;Demonstration;Handout  Comprehension  Returned demonstration;Verbalized understanding;Verbal cues required       OT Short Term Goals - 05/17/18 1633      OT SHORT TERM GOAL #1   Title  Independent with visual HEP     Time  4    Period  Weeks    Status  On-going      OT SHORT TERM GOAL #2   Title  Independent with putty HEP for Rt hand    Time  4    Period  Weeks    Status  On-going      OT SHORT TERM GOAL #3   Title  Pt/family to verbalize understanding with visual compensatory strategies for Rt visual field cut    Time  4    Period  Weeks    Status  On-going      OT SHORT TERM GOAL #4   Title  Pt to perform simple snack prep and light household chores  with supervision only    Time  4    Period  Weeks    Status  On-going      OT SHORT TERM GOAL #5   Title  Pt to perform environmental scanning with 75% accuracy or greater    Time  4    Period  Weeks    Status  On-going        OT Long Term Goals - 04/30/18 1321      OT LONG TERM GOAL #1   Title  Pt to improve grip strength Rt dominant hand to 60 lbs or greater    Baseline  45 lbs    Time  8    Period  Weeks    Status  New    Target Date  06/30/18      OT LONG TERM GOAL #2   Title  Pt to perform environmental scanning while performing simple physical task at 90% accuracy    Time  8    Period  Weeks    Status  New      OT LONG TERM GOAL #3   Title  Pt to return to cooking tasks with direct supervision only    Time  8    Period  Weeks    Status  New      OT LONG TERM GOAL #4   Title  Pt to return to home management tasks with direct supervision only    Time  8    Period  Weeks    Status  New            Plan - 05/17/18 1632    Clinical Impression Statement  Pt is progressing towards goals. Pt demonstrates improving tabletop scanning.    Occupational performance deficits (Please refer to evaluation for details):  ADL's;IADL's;Work;Leisure;Social Participation    Rehab Potential  Good    Current Impairments/barriers affecting progress:  visual deficits, aphasia    OT Frequency  2x / week    OT Duration  8 weeks    OT Treatment/Interventions  Self-care/ADL training;DME and/or AE instruction;Therapeutic activities;Therapeutic exercise;Cognitive remediation/compensation;Coping strategies training;Neuromuscular education;Functional Mobility Training;Passive range of motion;Visual/perceptual remediation/compensation;Manual Therapy;Patient/family education    Plan  environmental scanning,  functional tasks with a visual component    Consulted and Agree with Plan of Care  Patient;Family member/caregiver    Family Member Consulted  daughter       Patient will benefit  from skilled therapeutic intervention in order to improve the following deficits and impairments:  Decreased coordination,  Improper body mechanics, Decreased safety awareness, Decreased endurance, Decreased activity tolerance, Decreased knowledge of precautions, Impaired UE functional use, Decreased knowledge of use of DME, Decreased cognition, Decreased mobility, Decreased strength, Impaired perceived functional ability, Impaired vision/preception  Visit Diagnosis: Muscle weakness (generalized)  Other lack of coordination  Neurologic neglect syndrome  Attention and concentration deficit    Problem List Patient Active Problem List   Diagnosis Date Noted  . DVT, lower extremity, distal, acute, right ([place]) 04/25/2018  . Acute lower UTI   . Atelectasis   . Hypoalbuminemia due to protein-calorie malnutrition ([place])   . Left temporal lobe hemorrhage ([place]) 04/15/2018  . Vascular headache   . Seizure prophylaxis   . Cerebral edema (HCC)   . Atrial fibrillation with rapid ventricular response ([place])   . Pneumonia   . Diabetes mellitus type 2 in nonobese (HCC)   . Benign essential HTN   . History of pancreatic cancer   . FUO (fever of unknown origin)   . Dysphagia, post-stroke   . Tachycardia   . Tachypnea   . Hypothyroidism   . Hypernatremia   . Hypokalemia   . Acute blood loss anemia   . Thrombocytopenia ([place])   . Intracranial hemorrhage ([place]) 04/05/2018  . Acute pancreatitis 06/11/2014  . Headache 06/11/2014  . 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 Ampullary carcinoma ([place]) 12/06/2012  . Obstructive jaundice 11/11/2012  . Biliary stricture 11/11/2012  . Diabetes mellitus ([place]) 11/10/2012  . HTN (hypertension) 11/10/2012  . Pancreatitis 11/10/2012  . Abdominal pain 11/10/2012    Lashawnta Burgert 05/17/2018, 4:34 PM  [facility] [address] [place], [place], [address] Phone: [phone]   Fax:   [phone]  Name: Joncarlos Atkison MRN: [id number] Date of Birth: 1961/06/08

## 2018-05-17 NOTE — Therapy (Addendum)
[facility] [address] [place], [place], [address] Phone: [phone]   Fax:  [phone]  Physical Therapy Treatment  Patient Details  Name: Hector Pugh MRN: [id number] Date of Birth: November 17, 1961 Referring Provider: Dr. Delice Lesch   Encounter Date: 05/17/2018  PT End of Session - 05/17/18 0942    Visit Number  3    Number of Visits  9    Date for PT Re-Evaluation  06/02/18    Authorization Type  [employer]    PT Start Time  0845    PT Stop Time  0928    PT Time Calculation (min)  43 min    Activity Tolerance  Patient tolerated treatment well    Behavior During Therapy  [facility] for tasks assessed/performed       Past Medical History:  Diagnosis Date  . 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 Cancer ([place])   . Diabetes mellitus ([place]) 11/10/2012  . Diabetes mellitus without complication (HCC)    diet controlled  . HTN (hypertension) 11/10/2012  . Hypertension   . 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 Pancreatic cancer ([place])   . Pancreatitis 11/10/2012  . Seasonal allergies     Past Surgical History:  Procedure Laterality Date  . circucision    . circumsision    . COLONOSCOPY N/A 04/04/2018   Procedure: COLONOSCOPY;  Surgeon: Leighton Ruff, MD;  [facility];  Service: Endoscopy;  Laterality: N/A;  . CRANIOTOMY Left 04/05/2018   Procedure: Left Temporal CRANIOTOMY HEMATOMA EVACUATION of Intracranial Hemorrhage;  Surgeon: Ashok Pall, MD;  Location: [facility];  Service: Neurosurgery;  Laterality: Left;  . ERCP  11/08/2012   Procedure: ENDOSCOPIC RETROGRADE CHOLANGIOPANCREATOGRAPHY (ERCP);  Surgeon: Jeryl Columbia, MD;  [facility];  Service: Gastroenterology;  Laterality: N/A;  . EUS  11/08/2012   Procedure: UPPER ENDOSCOPIC ULTRASOUND (EUS) LINEAR;  Surgeon: Arta Silence, MD;  [facility];  Service: Endoscopy;  Laterality: N/A;  . FINE NEEDLE  ASPIRATION  11/08/2012   Procedure: FINE NEEDLE ASPIRATION (FNA) LINEAR;  Surgeon: Arta Silence, MD;  [facility];  Service: Endoscopy;  Laterality: N/A;  . HERNIA REPAIR     RIGHT  . LAPAROSCOPY  12/12/2012   Procedure: LAPAROSCOPY DIAGNOSTIC;  Surgeon: Stark Klein, MD;  [facility];  Service: General;  Laterality: N/A;  . Left Foot Surgery    . Left Foot Surgery    . VASECTOMY    . WHIPPLE PROCEDURE  12/12/2012   Procedure: WHIPPLE PROCEDURE;  Surgeon: Stark Klein, MD;  [facility];  Service: General;  Laterality: N/A;    There were no vitals filed for this visit.  Subjective Assessment - 05/17/18 0847    Subjective  Pt reports doing a lot of gardening this week.      Patient is accompained by:  Family member    Limitations  Walking    How long can you walk comfortably?  Has been able to ambulate in stores without issue.      Patient Stated Goals  "To move as best I can."     Currently in Pain?  No/denies                       [facility] Adult PT Treatment/Exercise - 05/17/18 0848      Ambulation/Gait   Ambulation/Gait  Yes    Ambulation/Gait Assistance  5: Supervision    Ambulation/Gait Assistance  Details  Had pt ambulate around therapy track x 400' without device while tossing ball x 115' prorgressing to tossing ball while naming foods in alphabetical order.  Note pt did slow gait speed slightly and had marked difficulty naming food even with max hinting cues but was able to keep up with appropriate letter.  Continued with this outdoors over level, unlevel and grassy surfaces ambulating while maintaining conversation and also while kicking ball and maintaining conversation.  During indoor gait dual task exercise did not have any LOB, however when outdoors on grassy surfaces while kicking ball and maintaining conversation, had 2-3 instances of imbalance needing min/guard to correct and prevent overt LOB.      Ambulation Distance (Feet)  1200 Feet     Assistive device  None    Gait Pattern  Within Functional Limits    Ambulation Surface  Level;Unlevel;Indoor;Outdoor;Paved;Grass    Curb  5: Supervision    Curb Details (indicate cue type and reason)  Cues for attending R when stepping up/down curb step outdoors.       Self-Care   Self-Care  Other Self-Care Comments    Other Self-Care Comments   Provided education to daughter on performing dual task exercises at home when ambulating in neighborhood and in yard for increased challenge.  Pt and daughter verbalized understanding.       Therapeutic Activites    Therapeutic Activities  Other Therapeutic Activities    Other Therapeutic Activities  Had pt perform stand>floor>stand transfer as he does at home when getting into prayer position.  Note that he is able to do at S level (has done at home but daughter has been providing distant S for safety.  Also note that he does without shoes at home.  Note mild difficulty, therefore continued with floor to stand transfers for LE strengthening and RLE NMR.  See therex below.       Exercises   Exercises  Other Exercises    Other Exercises   Performed stand to half kneeling to tall kneel and vice versa alternating LEs x 10 reps total.  Note some difficulty with processing due to cognitive issues and aphasia deficits, but with repetition and continued cues (daughter assisting in native language as needed) he was able to return demo.  Added this for HEP.  To continue LE strength, performed forward lunges x 30' with min A and mod cues for technique (wider step and keeping upright posture).  Switched to performing squats at counter top with UE support and chair behind him in order to provide visual cue to aim for chair for more correct technique.  Requires max tactile and verbal cues for correct technique, but did add to HEP as family is available to assist as needed.               PT Education - 05/17/18 0848    Education Details  additional exercises for  HEP, things to do at home to work on dual tasking    Person(s) Educated  Patient    Methods  Explanation    Comprehension  Verbalized understanding       PT Short Term Goals - 05/03/18 1313      PT SHORT TERM GOAL #1   Title  =LTGs        PT Long Term Goals - 05/03/18 1313      PT LONG TERM GOAL #1   Title  Pt will be independent with final HEP in order to indicate decreased fall  risk and improved functional mobility.  (Target date: 06/02/18)    Time  4    Period  Weeks    Status  New    Target Date  06/02/18      PT LONG TERM GOAL #2   Title  Pt will improve FGA to >/=28/30 in order to indicate decreased fall risk.      Time  4    Period  Weeks    Status  New      PT LONG TERM GOAL #3   Title  Pt will perform floor to stand tranfser without support and with no report of dizziness/light headedness in order to return to preferred prayer position.      Time  4    Period  Weeks    Status  New      PT LONG TERM GOAL #4   Title  Pt will ambulate >1000' over varying outdoor surfaces while engaging in dual task without overt LOB and with increased attention to R environment in order to return to community and leisure activity.     Time  4    Period  Weeks    Status  New            Plan - 05/17/18 0943    Clinical Impression Statement  Skilled session focused on dual tasking to add cognitive challenge to mobility.  Pt with marked difficulty maintaining cognitive task during mobility, esp when asked to name random items in certain order.  Also addressed floor to stand transitions for LTG.  Pt given HEP but is able to get into prayer position at S level.  Will continue to address.     Rehab Potential  Good    Clinical Impairments Affecting Rehab Potential  R inattention and R field cut    PT Frequency  2x / week    PT Duration  4 weeks    PT Treatment/Interventions  ADLs/Self Care Home Management;Gait training;Stair training;Functional mobility training;Therapeutic  activities;Therapeutic exercise;Balance training;Neuromuscular re-education;Cognitive remediation;Patient/family education;Energy conservation    PT Next Visit Plan  agility ladder, Add to HEP as needed, dual tasking, outdoor gait/balance tasks, floor to stand transfer to simulate prayer position (maybe have him do without shoes to fully assess as he does at home)    Consulted and Agree with Plan of Care  Patient;Family member/caregiver    Family Member Consulted  daughter       Patient will benefit from skilled therapeutic intervention in order to improve the following deficits and impairments:  Decreased activity tolerance, Decreased balance, Decreased safety awareness, Decreased coordination, Impaired vision/preception  Visit Diagnosis: Muscle weakness (generalized)  Unsteadiness on feet  Other abnormalities of gait and mobility     Problem List Patient Active Problem List   Diagnosis Date Noted  . DVT, lower extremity, distal, acute, right ([place]) 04/25/2018  . Acute lower UTI   . Atelectasis   . Hypoalbuminemia due to protein-calorie malnutrition ([place])   . Left temporal lobe hemorrhage ([place]) 04/15/2018  . Vascular headache   . Seizure prophylaxis   . Cerebral edema (H[facility])   . Atrial fibrillation with rapid ventricular response ([place])   . Pneumonia   . Diabetes mellitus type 2 in nonobese ([facility])   . Benign essential HTN   . History of pancreatic cancer   . FUO (fever of unknown origin)   . Dysphagia, post-stroke   . Tachycardia   . Tachypnea   . Hypothyroidism   . Hypernatremia   .  Hypokalemia   . Acute blood loss anemia   . Thrombocytopenia ([place])   . Intracranial hemorrhage ([place]) 04/05/2018  . Acute pancreatitis 06/11/2014  . Headache 06/11/2014  . 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 Ampullary carcinoma ([place]) 12/06/2012  . Obstructive jaundice 11/11/2012  . Biliary stricture 11/11/2012  . Diabetes mellitus ([place]) 11/10/2012  . HTN (hypertension)  11/10/2012  . Pancreatitis 11/10/2012  . Abdominal pain 11/10/2012    Cameron Sprang, PT, MPT [facility] [address] [place], [place], [address] Phone: [phone]   Fax:  [phone] 05/17/18, 10:03 AM  Name: Marquee Fuchs MRN: [id number] Date of Birth: [birth year]

## 2018-05-21 ENCOUNTER — Ambulatory Visit: Payer: Managed Care, Other (non HMO) | Admitting: Physical Therapy

## 2018-05-21 ENCOUNTER — Ambulatory Visit: Payer: Managed Care, Other (non HMO)

## 2018-05-21 ENCOUNTER — Ambulatory Visit: Payer: Managed Care, Other (non HMO) | Admitting: Occupational Therapy

## 2018-05-21 ENCOUNTER — Ambulatory Visit: Payer: Self-pay

## 2018-05-21 ENCOUNTER — Encounter: Payer: Self-pay | Admitting: Physical Therapy

## 2018-05-21 DIAGNOSIS — R414 Neurologic neglect syndrome: Secondary | ICD-10-CM

## 2018-05-21 DIAGNOSIS — I69118 Other symptoms and signs involving cognitive functions following nontraumatic intracerebral hemorrhage: Secondary | ICD-10-CM

## 2018-05-21 DIAGNOSIS — R41842 Visuospatial deficit: Secondary | ICD-10-CM

## 2018-05-21 DIAGNOSIS — R4701 Aphasia: Secondary | ICD-10-CM

## 2018-05-21 DIAGNOSIS — R2681 Unsteadiness on feet: Secondary | ICD-10-CM

## 2018-05-21 DIAGNOSIS — R41841 Cognitive communication deficit: Secondary | ICD-10-CM

## 2018-05-21 DIAGNOSIS — M6281 Muscle weakness (generalized): Secondary | ICD-10-CM

## 2018-05-21 DIAGNOSIS — R2689 Other abnormalities of gait and mobility: Secondary | ICD-10-CM

## 2018-05-21 DIAGNOSIS — R41840 Attention and concentration deficit: Secondary | ICD-10-CM

## 2018-05-21 NOTE — Patient Instructions (Signed)
  Please complete the assigned speech therapy homework prior to your next session and return it to the speech therapist at your next visit.  

## 2018-05-21 NOTE — Therapy (Signed)
Hector Pugh [address] [place], [place], [address] Phone: [phone]   Fax:  [phone]  Speech Language Pathology Treatment  Patient Details  Name: Hector Pugh MRN: [id number] Date of Birth: October 05, 1961 Referring Provider: Delice Lesch, MD   Encounter Date: 05/21/2018  End of Session - 05/21/18 1817    Visit Number  2    Number of Visits  17    Date for SLP Re-Evaluation  08/01/18    Authorization Time Period  05-10-18 to 11-09-18    Authorization - Visit Number  2    Authorization - Number of Visits  25    SLP Start Time  4268    SLP Stop Time   1402    SLP Time Calculation (min)  44 min    Activity Tolerance  Patient tolerated treatment well       Past Medical History:  Diagnosis Date  . 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 Cancer ([place])   . Diabetes mellitus ([place]) 11/10/2012  . Diabetes mellitus without complication (HCC)    diet controlled  . HTN (hypertension) 11/10/2012  . Hypertension   . 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 Pancreatic cancer ([place])   . Pancreatitis 11/10/2012  . Seasonal allergies     Past Surgical History:  Procedure Laterality Date  . circucision    . circumsision    . COLONOSCOPY N/A 04/04/2018   Procedure: COLONOSCOPY;  Surgeon: Leighton Ruff, MD;  [facility];  Service: Endoscopy;  Laterality: N/A;  . CRANIOTOMY Left 04/05/2018   Procedure: Left Temporal CRANIOTOMY HEMATOMA EVACUATION of Intracranial Hemorrhage;  Surgeon: Ashok Pall, MD;  Location: [facility];  Service: Neurosurgery;  Laterality: Left;  . ERCP  11/08/2012   Procedure: ENDOSCOPIC RETROGRADE CHOLANGIOPANCREATOGRAPHY (ERCP);  Surgeon: Jeryl Columbia, MD;  Location: [facility];  Service: Gastroenterology;  Laterality: N/A;  . EUS  11/08/2012   Procedure: UPPER ENDOSCOPIC ULTRASOUND (EUS) LINEAR;  Surgeon: Arta Silence, MD;  [facility];  Service:  Endoscopy;  Laterality: N/A;  . FINE NEEDLE ASPIRATION  11/08/2012   Procedure: FINE NEEDLE ASPIRATION (FNA) LINEAR;  Surgeon: Arta Silence, MD;  [facility];  Service: Endoscopy;  Laterality: N/A;  . HERNIA REPAIR     RIGHT  . LAPAROSCOPY  12/12/2012   Procedure: LAPAROSCOPY DIAGNOSTIC;  Surgeon: Stark Klein, MD;  [facility];  Service: General;  Laterality: N/A;  . Left Foot Surgery    . Left Foot Surgery    . VASECTOMY    . WHIPPLE PROCEDURE  12/12/2012   Procedure: WHIPPLE PROCEDURE;  Surgeon: Stark Klein, MD;  [facility];  Service: General;  Laterality: N/A;    There were no vitals filed for this visit.  Subjective Assessment - 05/21/18 1323    Subjective  "It's alright." (re SLP: "How are you today?")    Currently in Pain?  No/denies            ADULT SLP TREATMENT - 05/21/18 1323      General Information   Behavior/Cognition  Alert;Pleasant mood;Cooperative;Impulsive      Treatment Provided   Treatment provided  Cognitive-Linquistic      Cognitive-Linquistic Treatment   Treatment focused on  Aphasia    Skilled Treatment  SLP worked with pt today on receptive and expressive langauge - demo'd understandign of sentences/detailed description with 70% success with repetitions allowed (50% of the time). Pt also exhibited deficits during this  task with sustained/selective attention with impulsiveness as he did not always look at all choices prior to making his decision. Pt with anomic episodes x7 during today's session- did not use compensatory measures and pt benfitted most from spelling cues. Semantic cues did not prove very helpful for pt today.       Assessment / Recommendations / Plan   Plan  Continue with current plan of care      Progression Toward Goals   Progression toward goals  Progressing toward goals         SLP Short Term Goals - 05/21/18 1820      SLP SHORT TERM GOAL #1   Title  pt will generate sentence responses in min-mod  complex language tasks 80% accuracy with min-mod A occasionally for error awareness    Time  4    Period  Weeks ot 9 total sessions, for all STGs    Status  On-going      SLP SHORT TERM GOAL #2   Title  pt will read sentences of 8-9 words with error awareness at 90% over three sessions    Time  4    Period  Weeks    Status  On-going      SLP SHORT TERM GOAL #3   Title  pt will demo understanding of spoken sentence level stimuli with 80% success over three sesions    Time  4    Period  Weeks    Status  On-going      SLP SHORT TERM GOAL #4   Title  pt will complete cognitive linguistic testing within first 4 therapy sessions    Time  2    Period  Weeks or 5 total sessions    Status  On-going      SLP SHORT TERM GOAL #5   Title  pt will complete a HEP for increasing accuracy of his verbal output with 90% success with rare min A for error awareness    Time  4    Period  Weeks    Status  On-going       SLP Long Term Goals - 05/21/18 1820      SLP LONG TERM GOAL #1   Title  pt will complete a HEP for improving accuracy of his verbal output with 90% success over three sessions    Time  8    Period  Weeks or 17 sessions    Status  On-going      SLP LONG TERM GOAL #2   Title  pt will participate in 8 minutes of mod complex conversation appropriately, with modified independence    Time  8    Period  Weeks or 17 sessions    Status  On-going      SLP LONG TERM GOAL #3   Title  pt will demo understanding of 8 minutes mod complex conversation over three visits, with modified independence    Time  8    Period  Weeks or 17 visits    Status  On-going      SLP LONG TERM GOAL #4   Title  pt will demo understanding of 15 minutes mod complex/complex conversation over three visits, with modified independence    Time  12    Period  Weeks or 25 total visits    Status  On-going      SLP LONG TERM GOAL #5   Title  pt will demo awareness of verbal speech errors by correcting 95%  of  errors in 15 minutes mod complex/complex conversation over two sessions    Time  12    Period  Weeks or 25 visits    Status  On-going       Plan - 05/21/18 1818    Clinical Impression Statement  Mr. Yasui presents today with mod aphasia (mod expressive, mod receptive) which is less of a deficit in his native language. Pt also continues to exhibit some degree of cognitive-linguistic deficits- seen in decr'd sustained/selective attention. Written and reading tasks are complicated by rt inattention. and rt field cut. He would benefit from skilled ST targeting langauge skills (recpetive and expressive), and from a cognitive linguistic evaluation and probable goals generated, as OT is not directly addressing this in their plan of care.     Speech Therapy Frequency  2x / week    Duration  -- 12 weeks, or 25 total visits    Treatment/Interventions  Internal/external aids;Patient/family education;Compensatory strategies;SLP instruction and feedback;Functional tasks;Cognitive reorganization;Cueing hierarchy;Environmental controls;Language facilitation    Potential to Achieve Goals  Good    Consulted and Agree with Plan of Care  Patient;Family member/caregiver    Family Member Consulted  daughter       Patient will benefit from skilled therapeutic intervention in order to improve the following deficits and impairments:   Aphasia  Cognitive communication deficit    Problem List Patient Active Problem List   Diagnosis Date Noted  . DVT, lower extremity, distal, acute, right ([place]) 04/25/2018  . Acute lower UTI   . Atelectasis   . Hypoalbuminemia due to protein-calorie malnutrition ([place])   . Left temporal lobe hemorrhage ([place]) 04/15/2018  . Vascular headache   . Seizure prophylaxis   . Cerebral edema ([facility])   . Atrial fibrillation with rapid ventricular response ([place])   . Pneumonia   . Diabetes mellitus type 2 in nonobese (HCC)   . Benign essential HTN   . History of pancreatic cancer   .  FUO (fever of unknown origin)   . Dysphagia, post-stroke   . Tachycardia   . Tachypnea   . Hypothyroidism   . Hypernatremia   . Hypokalemia   . Acute blood loss anemia   . Thrombocytopenia ([place])   . Intracranial hemorrhage ([place]) 04/05/2018  . Acute pancreatitis 06/11/2014  . Headache 06/11/2014  . 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 Obstructive jaundice 11/11/2012  . Biliary stricture 11/11/2012  . Diabetes mellitus ([place]) 11/10/2012  . HTN (hypertension) 11/10/2012  . Pancreatitis 11/10/2012  . Abdominal pain 11/10/2012    [facility] ,[place], CCC-SLP  05/21/2018, 6:20 PM  [facility] [address] [place], [place], [address] Phone: [phone]   Fax:  [phone]   Name: Shilo Pauwels MRN: [id number] Date of Birth: 31-Jan-1961

## 2018-05-21 NOTE — Therapy (Signed)
[facility] [address] [place], [place], [address] Phone: [phone]   Fax:  [phone]  Occupational Therapy Treatment  Patient Details  Name: Hector Pugh MRN: [id number] Date of Birth: 01-Jun-1961 Referring Provider: Dr. Delice Lesch   Encounter Date: 05/21/2018  OT End of Session - 05/21/18 1559    Visit Number  5    Number of Visits  17    Date for OT Re-Evaluation  06/30/18    Authorization Type  [employer]    OT Start Time  1455    OT Stop Time  4656    OT Time Calculation (min)  43 min       Past Medical History:  Diagnosis Date  . 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 Cancer ([place])   . Diabetes mellitus ([facility]) 11/10/2012  . Diabetes mellitus without complication (HCC)    diet controlled  . HTN (hypertension) 11/10/2012  . Hypertension   . 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 Pancreatic cancer ([place])   . Pancreatitis 11/10/2012  . Seasonal allergies     Past Surgical History:  Procedure Laterality Date  . circucision    . circumsision    . COLONOSCOPY N/A 04/04/2018   Procedure: COLONOSCOPY;  Surgeon: Leighton Ruff, MD;  [facility];  Service: Endoscopy;  Laterality: N/A;  . CRANIOTOMY Left 04/05/2018   Procedure: Left Temporal CRANIOTOMY HEMATOMA EVACUATION of Intracranial Hemorrhage;  Surgeon: Ashok Pall, MD;  Location: [facility];  Service: Neurosurgery;  Laterality: Left;  . ERCP  11/08/2012   Procedure: ENDOSCOPIC RETROGRADE CHOLANGIOPANCREATOGRAPHY (ERCP);  Surgeon: Jeryl Columbia, MD;  Location: [facility];  Service: Gastroenterology;  Laterality: N/A;  . EUS  11/08/2012   Procedure: UPPER ENDOSCOPIC ULTRASOUND (EUS) LINEAR;  Surgeon: Arta Silence, MD;  [facility];  Service: Endoscopy;  Laterality: N/A;  . FINE NEEDLE ASPIRATION  11/08/2012   Procedure: FINE NEEDLE ASPIRATION (FNA) LINEAR;  Surgeon: Arta Silence, MD;  [facility];  Service: Endoscopy;  Laterality: N/A;  . HERNIA REPAIR     RIGHT  . LAPAROSCOPY  12/12/2012   Procedure: LAPAROSCOPY DIAGNOSTIC;  Surgeon: Stark Klein, MD;  [facility];  Service: General;  Laterality: N/A;  . Left Foot Surgery    . Left Foot Surgery    . VASECTOMY    . WHIPPLE PROCEDURE  12/12/2012   Procedure: WHIPPLE PROCEDURE;  Surgeon: Stark Klein, MD;  [facility];  Service: General;  Laterality: N/A;    There were no vitals filed for this visit.                 Treatment: Pipe tree design for visual perceptual skills with a cognitive component. Pt organized all pieces without prompting and completed design correctly. Activities on I-PAD for alternating attention, visual memory, and scanning, min-mod difficulty, mod v.c required for alternating attention task initially. Completing a 12 piece puzzle, increased time and min v.c to slow down and for puzzle orientation         OT Short Term Goals - 05/21/18 1600      OT SHORT TERM GOAL #1   Title  Independent with visual HEP     Time  4    Period  Weeks    Status  On-going      OT SHORT TERM GOAL #2   Title  Independent with putty HEP for Rt hand    Time  4  Period  Weeks    Status  On-going      OT SHORT TERM GOAL #3   Title  Pt/family to verbalize understanding with visual compensatory strategies for Rt visual field cut    Time  4    Period  Weeks    Status  On-going      OT SHORT TERM GOAL #4   Title  Pt to perform simple snack prep and light household chores with supervision only    Time  4    Period  Weeks    Status  On-going      OT SHORT TERM GOAL #5   Title  Pt to perform environmental scanning with 75% accuracy or greater    Time  4    Period  Weeks    Status  On-going        OT Long Term Goals - 04/30/18 1321      OT LONG TERM GOAL #1   Title  Pt to improve grip strength Rt dominant hand to 60 lbs or greater    Baseline  45 lbs    Time  8    Period   Weeks    Status  New    Target Date  06/30/18      OT LONG TERM GOAL #2   Title  Pt to perform environmental scanning while performing simple physical task at 90% accuracy    Time  8    Period  Weeks    Status  New      OT LONG TERM GOAL #3   Title  Pt to return to cooking tasks with direct supervision only    Time  8    Period  Weeks    Status  New      OT LONG TERM GOAL #4   Title  Pt to return to home management tasks with direct supervision only    Time  8    Period  Weeks    Status  New            Plan - 05/21/18 1600    Clinical Impression Statement  Pt is progressing towards goals. Pt demonstrated good organization strategies during pipetree design today. He requires cueing to slow down when scanning for improved accuracy.    Rehab Potential  Good    Current Impairments/barriers affecting progress:  visual deficits, aphasia    OT Frequency  2x / week    OT Duration  8 weeks    OT Treatment/Interventions  Self-care/ADL training;DME and/or AE instruction;Therapeutic activities;Therapeutic exercise;Cognitive remediation/compensation;Coping strategies training;Neuromuscular education;Functional Mobility Training;Passive range of motion;Visual/perceptual remediation/compensation;Manual Therapy;Patient/family education    Plan  issue vision HEP, environmental scanning,  functional tasks with a visual component    Consulted and Agree with Plan of Care  Patient;Family member/caregiver    Family Member Consulted  daughter       Patient will benefit from skilled therapeutic intervention in order to improve the following deficits and impairments:     Visit Diagnosis: Neurologic neglect syndrome  Attention and concentration deficit  Visuospatial deficit  Other symptoms and signs 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 DVT, lower extremity, distal, acute,  right ([place]) 04/25/2018  . Acute lower UTI   . Atelectasis   . Hypoalbuminemia due to protein-calorie malnutrition ([place])   . Left temporal lobe hemorrhage ([place]) 04/15/2018  . Vascular headache   .  Seizure prophylaxis   . Cerebral edema ([facility])   . Atrial fibrillation with rapid ventricular response ([place])   . Pneumonia   . Diabetes mellitus type 2 in nonobese ([facility])   . Benign essential HTN   . History of pancreatic cancer   . FUO (fever of unknown origin)   . Dysphagia, post-stroke   . Tachycardia   . Tachypnea   . Hypothyroidism   . Hypernatremia   . Hypokalemia   . Acute blood loss anemia   . Thrombocytopenia ([place])   . Intracranial hemorrhage ([place]) 04/05/2018  . Acute pancreatitis 06/11/2014  . Headache 06/11/2014  . 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 Obstructive jaundice 11/11/2012  . Biliary stricture 11/11/2012  . Diabetes mellitus ([facility]) 11/10/2012  . HTN (hypertension) 11/10/2012  . Pancreatitis 11/10/2012  . Abdominal pain 11/10/2012    Hector Pugh 05/21/2018, 4:02 PM  [facility] [address] [place], [place], [address] Phone: [phone]   Fax:  [phone]  Name: Hector Pugh MRN: [id number] Date of Birth: 1961/06/01

## 2018-05-22 ENCOUNTER — Encounter: Payer: Managed Care, Other (non HMO) | Admitting: Speech Pathology

## 2018-05-22 ENCOUNTER — Ambulatory Visit: Payer: Managed Care, Other (non HMO)

## 2018-05-22 ENCOUNTER — Ambulatory Visit: Payer: Managed Care, Other (non HMO) | Admitting: Speech Pathology

## 2018-05-22 ENCOUNTER — Ambulatory Visit: Payer: Managed Care, Other (non HMO) | Admitting: Occupational Therapy

## 2018-05-22 ENCOUNTER — Encounter: Payer: Self-pay | Admitting: Speech Pathology

## 2018-05-22 ENCOUNTER — Other Ambulatory Visit: Payer: Self-pay

## 2018-05-22 DIAGNOSIS — R2681 Unsteadiness on feet: Secondary | ICD-10-CM

## 2018-05-22 DIAGNOSIS — R2689 Other abnormalities of gait and mobility: Secondary | ICD-10-CM

## 2018-05-22 DIAGNOSIS — R41841 Cognitive communication deficit: Secondary | ICD-10-CM

## 2018-05-22 DIAGNOSIS — M6281 Muscle weakness (generalized): Secondary | ICD-10-CM

## 2018-05-22 DIAGNOSIS — R414 Neurologic neglect syndrome: Secondary | ICD-10-CM

## 2018-05-22 DIAGNOSIS — R41842 Visuospatial deficit: Secondary | ICD-10-CM | POA: Diagnosis not present

## 2018-05-22 DIAGNOSIS — R41840 Attention and concentration deficit: Secondary | ICD-10-CM

## 2018-05-22 DIAGNOSIS — R4701 Aphasia: Secondary | ICD-10-CM

## 2018-05-22 NOTE — Therapy (Signed)
[facility] [address] [place], [place], [address] Phone: [phone]   Fax:  [phone]  Physical Therapy Treatment  Patient Details  Name: Hector Pugh MRN: [id number] Date of Birth: 1961-04-11 Referring Provider: Dr. Delice Lesch   Encounter Date: 05/21/2018  PT End of Session - 05/21/18 1406    Visit Number  4    Number of Visits  9    Date for PT Re-Evaluation  06/02/18    Authorization Type  [employer]    PT Start Time  1405    PT Stop Time  1445    PT Time Calculation (min)  40 min    Equipment Utilized During Treatment  Gait belt    Activity Tolerance  Patient tolerated treatment well    Behavior During Therapy  [facility] for tasks assessed/performed       Past Medical History:  Diagnosis Date  . 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 Cancer ([place])   . Diabetes mellitus ([place]) 11/10/2012  . Diabetes mellitus without complication (HCC)    diet controlled  . HTN (hypertension) 11/10/2012  . Hypertension   . 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 Pancreatic cancer ([place])   . Pancreatitis 11/10/2012  . Seasonal allergies     Past Surgical History:  Procedure Laterality Date  . circucision    . circumsision    . COLONOSCOPY N/A 04/04/2018   Procedure: COLONOSCOPY;  Surgeon: Leighton Ruff, MD;  [facility];  Service: Endoscopy;  Laterality: N/A;  . CRANIOTOMY Left 04/05/2018   Procedure: Left Temporal CRANIOTOMY HEMATOMA EVACUATION of Intracranial Hemorrhage;  Surgeon: Ashok Pall, MD;  Location: [facility];  Service: Neurosurgery;  Laterality: Left;  . ERCP  11/08/2012   Procedure: ENDOSCOPIC RETROGRADE CHOLANGIOPANCREATOGRAPHY (ERCP);  Surgeon: Jeryl Columbia, MD;  Location: [facility];  Service: Gastroenterology;  Laterality: N/A;  . EUS  11/08/2012   Procedure: UPPER ENDOSCOPIC ULTRASOUND (EUS) LINEAR;  Surgeon: Arta Silence, MD;  [facility];  Service:  Endoscopy;  Laterality: N/A;  . FINE NEEDLE ASPIRATION  11/08/2012   Procedure: FINE NEEDLE ASPIRATION (FNA) LINEAR;  Surgeon: Arta Silence, MD;  [facility];  Service: Endoscopy;  Laterality: N/A;  . HERNIA REPAIR     RIGHT  . LAPAROSCOPY  12/12/2012   Procedure: LAPAROSCOPY DIAGNOSTIC;  Surgeon: Stark Klein, MD;  [facility];  Service: General;  Laterality: N/A;  . Left Foot Surgery    . Left Foot Surgery    . VASECTOMY    . WHIPPLE PROCEDURE  12/12/2012   Procedure: WHIPPLE PROCEDURE;  Surgeon: Stark Klein, MD;  [facility];  Service: General;  Laterality: N/A;    There were no vitals filed for this visit.  Subjective Assessment - 05/21/18 1405    Subjective  No new complaints. No falls or pain to report. Was sore in the legs after last session.     Limitations  Walking    How long can you walk comfortably?  Has been able to ambulate in stores without issue.      Patient Stated Goals  "To move as best I can."     Currently in Pain?  No/denies    Pain Score  0-No pain           [facility] Adult PT Treatment/Exercise - 05/21/18 1407      Transfers   Transfers  Sit to Stand;Stand to Sit;Floor to Transfer    Sit  to Stand  7: Independent    Stand to Sit  7: Independent    Floor to Transfer  5: Supervision    Floor to Transfer Details (indicate cue type and reason)  using red mat: no shoes/socks going down into half kneel and back up with no imbalance noted.       Ambulation/Gait   Ambulation/Gait  Yes    Ambulation/Gait Assistance  5: Supervision;4: Min guard    Ambulation/Gait Assistance Details  egaged pt in dual task. mild veering noted with cues to stay on task. some pauses needed due to business of gym area    Assistive device  None    Gait Pattern  Within Functional Limits    Ambulation Surface  Level;Indoor    Gait Comments  gait around track while self tossing hankerchief and naming animals A-Z with cues needed on animals  75-80%      High Level  Balance   High Level Balance Activities  Marching forwards;Marching backwards;Tandem walking tandem and toe walk fwd/bwd    High Level Balance Comments  on red mats next to counter top: 3 laps each with min assist, occasional touch to counter. cues to slow down and on ex form/technique.           Balance Exercises - 05/21/18 1437      Balance Exercises: Standing   SLS with Vectors  Foam/compliant surface;4 reps;Limitations    Step Over Hurdles / Cones  hurdles of varied heights on both red mats: reciprocal stepping over x 6 laps with no UE support, min guard assist. knocked over hurdles x2 on first 2 laps, no issues on remaining laps.                         Balance Exercises: Standing   SLS with Vectors Limitations  cones along both edges of both red mats: lateral taps to each cone with modified tandem walk down center of mats x 4 laps. min assist with cues on weight shifting and ex form.           PT Short Term Goals - 05/03/18 1313      PT SHORT TERM GOAL #1   Title  =LTGs        PT Long Term Goals - 05/03/18 1313      PT LONG TERM GOAL #1   Title  Pt will be independent with final HEP in order to indicate decreased fall risk and improved functional mobility.  (Target date: 06/02/18)    Time  4    Period  Weeks    Status  New    Target Date  06/02/18      PT LONG TERM GOAL #2   Title  Pt will improve FGA to >/=28/30 in order to indicate decreased fall risk.      Time  4    Period  Weeks    Status  New      PT LONG TERM GOAL #3   Title  Pt will perform floor to stand tranfser without support and with no report of dizziness/light headedness in order to return to preferred prayer position.      Time  4    Period  Weeks    Status  New      PT LONG TERM GOAL #4   Title  Pt will ambulate >1000' over varying outdoor surfaces while engaging in dual task without overt LOB and with increased attention to  R environment in order to return to community and leisure activity.      Time  4    Period  Weeks    Status  New            Plan - 05/21/18 1406    Clinical Impression Statement  Today's skilled session readdressed floor transfers with no imbalance noted. Remainder of session continued to focus on high level balance with only fatigue reported. Short rest breaks helped with this. Pt does continue to be challenged by dual tasking and balance on compliant surfaces. Pt is progressing toward goals and should benefit from continued PT to progress toward unmet goals.     Rehab Potential  Good    Clinical Impairments Affecting Rehab Potential  R inattention and R field cut    PT Frequency  2x / week    PT Duration  4 weeks    PT Treatment/Interventions  ADLs/Self Care Home Management;Gait training;Stair training;Functional mobility training;Therapeutic activities;Therapeutic exercise;Balance training;Neuromuscular re-education;Cognitive remediation;Patient/family education;Energy conservation    PT Next Visit Plan  agility ladder, Add to HEP as needed, dual tasking, outdoor gait/balance tasks    Consulted and Agree with Plan of Care  Patient;Family member/caregiver    Family Member Consulted  daughter       Patient will benefit from skilled therapeutic intervention in order to improve the following deficits and impairments:  Decreased activity tolerance, Decreased balance, Decreased safety awareness, Decreased coordination, Impaired vision/preception  Visit Diagnosis: Muscle weakness (generalized)  Unsteadiness on feet  Other abnormalities of gait and mobility     Problem List Patient Active Problem List   Diagnosis Date Noted  . DVT, lower extremity, distal, acute, right ([place]) 04/25/2018  . Acute lower UTI   . Atelectasis   . Hypoalbuminemia due to protein-calorie malnutrition ([place])   . Left temporal lobe hemorrhage ([place]) 04/15/2018  . Vascular headache   . Seizure prophylaxis   . Cerebral edema (HCC)   . Atrial fibrillation with rapid  ventricular response ([place])   . Pneumonia   . Diabetes mellitus type 2 in nonobese (HCC)   . Benign essential HTN   . History of pancreatic cancer   . FUO (fever of unknown origin)   . Dysphagia, post-stroke   . Tachycardia   . Tachypnea   . Hypothyroidism   . Hypernatremia   . Hypokalemia   . Acute blood loss anemia   . Thrombocytopenia ([place])   . Intracranial hemorrhage ([place]) 04/05/2018  . Acute pancreatitis 06/11/2014  . Headache 06/11/2014  . 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 Obstructive jaundice 11/11/2012  . Biliary stricture 11/11/2012  . Diabetes mellitus ([place]) 11/10/2012  . HTN (hypertension) 11/10/2012  . Pancreatitis 11/10/2012  . Abdominal pain 11/10/2012    Willow Ora, PTA, [facility] [address], [place], [place] [address] [phone] 05/22/18, 8:01 AM   Name: Jearl Soto MRN: [id number] Date of Birth: [birth year]

## 2018-05-22 NOTE — Therapy (Signed)
[facility] [address] [place], [place], [address] Phone: [phone]   Fax:  [phone]  Occupational Therapy Treatment  Patient Details  Name: Hector Pugh MRN: [id number] Date of Birth: 1961/07/30 Referring Provider: Dr. Delice Lesch   Encounter Date: 05/22/2018  OT End of Session - 05/22/18 1052    Visit Number  6    Number of Visits  17    Date for OT Re-Evaluation  06/30/18    Authorization Type  [employer]    OT Start Time  1020    OT Stop Time  1100    OT Time Calculation (min)  40 min    Activity Tolerance  Patient tolerated treatment well    Behavior During Therapy  [facility] for tasks assessed/performed       Past Medical History:  Diagnosis Date  . 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 Cancer ([place])   . Diabetes mellitus ([place]) 11/10/2012  . Diabetes mellitus without complication (HCC)    diet controlled  . HTN (hypertension) 11/10/2012  . Hypertension   . 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 Pancreatic cancer ([place])   . Pancreatitis 11/10/2012  . Seasonal allergies     Past Surgical History:  Procedure Laterality Date  . circucision    . circumsision    . COLONOSCOPY N/A 04/04/2018   Procedure: COLONOSCOPY;  Surgeon: Leighton Ruff, MD;  [facility];  Service: Endoscopy;  Laterality: N/A;  . CRANIOTOMY Left 04/05/2018   Procedure: Left Temporal CRANIOTOMY HEMATOMA EVACUATION of Intracranial Hemorrhage;  Surgeon: Ashok Pall, MD;  Location: [facility];  Service: Neurosurgery;  Laterality: Left;  . ERCP  11/08/2012   Procedure: ENDOSCOPIC RETROGRADE CHOLANGIOPANCREATOGRAPHY (ERCP);  Surgeon: Jeryl Columbia, MD;  Location[facility];  Service: Gastroenterology;  Laterality: N/A;  . EUS  11/08/2012   Procedure: UPPER ENDOSCOPIC ULTRASOUND (EUS) LINEAR;  Surgeon: Arta Silence, MD;  [facility];  Service: Endoscopy;  Laterality: N/A;  . FINE NEEDLE  ASPIRATION  11/08/2012   Procedure: FINE NEEDLE ASPIRATION (FNA) LINEAR;  Surgeon: Arta Silence, MD;  [facility];  Service: Endoscopy;  Laterality: N/A;  . HERNIA REPAIR     RIGHT  . LAPAROSCOPY  12/12/2012   Procedure: LAPAROSCOPY DIAGNOSTIC;  Surgeon: Stark Klein, MD;  [facility];  Service: General;  Laterality: N/A;  . Left Foot Surgery    . Left Foot Surgery    . VASECTOMY    . WHIPPLE PROCEDURE  12/12/2012   Procedure: WHIPPLE PROCEDURE;  Surgeon: Stark Klein, MD;  [facility];  Service: General;  Laterality: N/A;    There were no vitals filed for this visit.  Subjective Assessment - 05/22/18 1022    Patient is accompained by:  Family member daughter    Pertinent History  PMH: Ampullary Adenacarcinoma, DM2, HTN, tachycardia, A-fib    Limitations  no driving, 61/4 sup, no strenuous activity or lifting    Patient Stated Goals  be independent    Currently in Pain?  No/denies                   OT Treatments/Exercises (OP) - 05/22/18 0001      Visual/Perceptual Exercises   Word Finding  Word search (complex) finding 7 words in 15 min. (To complete rest at home)     Other Exercises  Pt issued visual compensatory strategies and reviewed with pt/daughter. Pt also issued home visual scanning activities  and env'tal scanning activities and reviewed. Pt issued visual HEP for visual attention and OROM strengthening.              OT Education - 05/22/18 1028    Education Details  Visual compensatory strategies, visual HEP (for visual attn), and home/environmental activities to encourage visual scanning    Person(s) Educated  Patient;Child(ren)    Methods  Explanation;Handout;Demonstration    Comprehension  Verbalized understanding       OT Short Term Goals - 05/22/18 1052      OT SHORT TERM GOAL #1   Title  Independent with visual HEP     Time  4    Period  Weeks    Status  On-going      OT SHORT TERM GOAL #2   Title  Independent with  putty HEP for Rt hand    Time  4    Period  Weeks    Status  Achieved      OT SHORT TERM GOAL #3   Title  Pt/family to verbalize understanding with visual compensatory strategies for Rt visual field cut    Time  4    Period  Weeks    Status  On-going      OT SHORT TERM GOAL #4   Title  Pt to perform simple snack prep and light household chores with supervision only    Time  4    Period  Weeks    Status  On-going      OT SHORT TERM GOAL #5   Title  Pt to perform environmental scanning with 75% accuracy or greater    Time  4    Period  Weeks    Status  On-going        OT Long Term Goals - 04/30/18 1321      OT LONG TERM GOAL #1   Title  Pt to improve grip strength Rt dominant hand to 60 lbs or greater    Baseline  45 lbs    Time  8    Period  Weeks    Status  New    Target Date  06/30/18      OT LONG TERM GOAL #2   Title  Pt to perform environmental scanning while performing simple physical task at 90% accuracy    Time  8    Period  Weeks    Status  New      OT LONG TERM GOAL #3   Title  Pt to return to cooking tasks with direct supervision only    Time  8    Period  Weeks    Status  New      OT LONG TERM GOAL #4   Title  Pt to return to home management tasks with direct supervision only    Time  8    Period  Weeks    Status  New            Plan - 05/22/18 1052    Clinical Impression Statement  Pt continues to progress towards goals. Pt/family given info and ex's today to improve visual scanning during functional tasks.     Occupational Profile and client history currently impacting functional performance  PMH: Ampullary adenacarcinoma, DM type 2, HTN, tachycardia, A-fib. 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[employer]    Occupational performance deficits (Please refer to evaluation for details):  ADL's;IADL's;Work;Leisure;Social Participation    Rehab Potential  Good    OT Frequency  2x / week    OT Duration  8 weeks    OT  Treatment/Interventions  Self-care/ADL training;DME and/or AE instruction;Therapeutic activities;Therapeutic exercise;Cognitive remediation/compensation;Coping strategies training;Neuromuscular education;Functional Mobility Training;Passive range of motion;Visual/perceptual remediation/compensation;Manual Therapy;Patient/family education    Plan  simple cooking task (grilled cheese), and environmental scanning. (following session: making chai, pt's family to bring in ingredients)     Consulted and Agree with Plan of Care  Patient;Family member/caregiver    Family Member Consulted  daughter       Patient will benefit from skilled therapeutic intervention in order to improve the following deficits and impairments:  Decreased coordination, Improper body mechanics, Decreased safety awareness, Decreased endurance, Decreased activity tolerance, Decreased knowledge of precautions, Impaired UE functional use, Decreased knowledge of use of DME, Decreased cognition, Decreased mobility, Decreased strength, Impaired perceived functional ability, Impaired vision/preception  Visit Diagnosis: Neurologic neglect syndrome  Attention and concentration deficit  Visuospatial deficit    Problem List Patient Active Problem List   Diagnosis Date Noted  . DVT, lower extremity, distal, acute, right ([place]) 04/25/2018  . Acute lower UTI   . Atelectasis   . Hypoalbuminemia due to protein-calorie malnutrition ([place])   . Left temporal lobe hemorrhage ([place]) 04/15/2018  . Vascular headache   . Seizure prophylaxis   . Cerebral edema (H[facility])   . Atrial fibrillation with rapid ventricular response ([place])   . Pneumonia   . Diabetes mellitus type 2 in nonobese (HCC)   . Benign essential HTN   . History of pancreatic cancer   . FUO (fever of unknown origin)   . Dysphagia, post-stroke   . Tachycardia   . Tachypnea   . Hypothyroidism   . Hypernatremia   . Hypokalemia   . Acute blood loss anemia   . Thrombocytopenia  ([place])   . Intracranial hemorrhage ([place]) 04/05/2018  . Acute pancreatitis 06/11/2014  . Headache 06/11/2014  . 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 Ampullary carcinoma ([place]) 12/06/2012  . Obstructive jaundice 11/11/2012  . Biliary stricture 11/11/2012  . Diabetes mellitus ([place]) 11/10/2012  . HTN (hypertension) 11/10/2012  . Pancreatitis 11/10/2012  . Abdominal pain 11/10/2012    Carey Bullocks, OTR/L 05/22/2018, 11:03 AM  [facility] [address] [place], [place], [address] Phone: [phone]   Fax:  [phone]  Name: Gatsby Chismar MRN: [id number] Date of Birth: [birth year]

## 2018-05-22 NOTE — Patient Instructions (Signed)
VISUAL COMPENSATORY STRATEGIES  1. Look for the edge of objects (to the left and/or right) so that you make sure you are seeing all of an object 2. Turn your head when walking, scan from side to side, particularly in busy environments 3. Use an organized scanning pattern. It's usually easier to scan from top to bottom, and left to right (like you are reading) 4. Double check yourself 5. Use a line guide (like a blank piece of paper) or your finger when reading 6. If necessary, place brightly colored tape at end of table or work area as a reminder to always look until you see the tape.   Activities to try at home to encourage visual scanning:   1. Word searches 2. Mazes 3. Puzzles 4. Card games 5. Computer games and/or searches 6. Connect-the-dots  Activities for environmental (larger) scanning:  1. With supervision, scan for items in grocery store or drugstore.  Begin with a familiar store, then progress to a new store you've never been in before. Make sure you have supervision with this.  2. With supervision, tell a family member or caregiver when it is safe to cross a street after looking all directions and any side streets. However, do NOT cross street unless family member or caregiver is with you and says it is OK   Visual HEP (for visual attention):  Perform at least 3 times per day. Stop if your eye becomes fatigued or hurts and try again later.  1. Hold a small object/card in front of you.  Hold it in the middle at arm's length away.    2. Cover your LEFT eye and look at the object with your RIGHT eye.  3. Slowly move the object side to side in front of you while continuing to watch it with your RIGHT eye.  4.  Remember to keep your head still and only move your eye.  5.  Repeat 5-10 times.  6.  Then, move object up and down while watching it 5-10 times.  7. Cover your RIGHT eye and look at the object with your LEFT eye while you repeat #1-6 above.  8.  Now, uncover  both eyes and try to focus on the object while holding it in the middle.  Try to make it 1 image.   9.  If you can, try to hold it for 10-30 sec increasing as able.    10.  Once you can make the image 1 for at least 30 sec in the middle, repeat #1-6 above with both eyes moving slowly and only in the range that you can keep the image 1.

## 2018-05-22 NOTE — Therapy (Signed)
[facility] [address] [place], [place], [address] Phone: [phone]   Fax:  [phone]  Physical Therapy Treatment  Patient Details  Name: Hector Pugh MRN: [id number] Date of Birth: October 02, 1961 Referring Provider: Dr. Delice Lesch   Encounter Date: 05/22/2018  PT End of Session - 05/22/18 1152    Visit Number  5    Number of Visits  9    Date for PT Re-Evaluation  06/02/18    Authorization Type  [employer]    PT Start Time  1103    PT Stop Time  1144    PT Time Calculation (min)  41 min    Equipment Utilized During Treatment  Gait belt    Activity Tolerance  Patient tolerated treatment well    Behavior During Therapy  [facility] for tasks assessed/performed       Past Medical History:  Diagnosis Date  . 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 Cancer ([place])   . Diabetes mellitus ([place]) 11/10/2012  . Diabetes mellitus without complication (HCC)    diet controlled  . HTN (hypertension) 11/10/2012  . Hypertension   . 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 Pancreatic cancer ([place])   . Pancreatitis 11/10/2012  . Seasonal allergies     Past Surgical History:  Procedure Laterality Date  . circucision    . circumsision    . COLONOSCOPY N/A 04/04/2018   Procedure: COLONOSCOPY;  Surgeon: Leighton Ruff, MD;  [facility];  Service: Endoscopy;  Laterality: N/A;  . CRANIOTOMY Left 04/05/2018   Procedure: Left Temporal CRANIOTOMY HEMATOMA EVACUATION of Intracranial Hemorrhage;  Surgeon: Ashok Pall, MD;  Location: [facility];  Service: Neurosurgery;  Laterality: Left;  . ERCP  11/08/2012   Procedure: ENDOSCOPIC RETROGRADE CHOLANGIOPANCREATOGRAPHY (ERCP);  Surgeon: Jeryl Columbia, MD;  Location: [facility];  Service: Gastroenterology;  Laterality: N/A;  . EUS  11/08/2012   Procedure: UPPER ENDOSCOPIC ULTRASOUND (EUS) LINEAR;  Surgeon: Arta Silence, MD;  [facility];  Service:  Endoscopy;  Laterality: N/A;  . FINE NEEDLE ASPIRATION  11/08/2012   Procedure: FINE NEEDLE ASPIRATION (FNA) LINEAR;  Surgeon: Arta Silence, MD;  [facility];  Service: Endoscopy;  Laterality: N/A;  . HERNIA REPAIR     RIGHT  . LAPAROSCOPY  12/12/2012   Procedure: LAPAROSCOPY DIAGNOSTIC;  Surgeon: Stark Klein, MD;  [facility];  Service: General;  Laterality: N/A;  . Left Foot Surgery    . Left Foot Surgery    . VASECTOMY    . WHIPPLE PROCEDURE  12/12/2012   Procedure: WHIPPLE PROCEDURE;  Surgeon: Stark Klein, MD;  [facility];  Service: General;  Laterality: N/A;    There were no vitals filed for this visit.  Subjective Assessment - 05/22/18 1106    Subjective  Pt denied falls or change since yesterday.     Patient is accompained by:  Family member dtr: Hector Pugh    Patient Stated Goals  "To move as best I can."     Currently in Pain?  No/denies              Balance Exercises - 05/22/18 1156      Balance Exercises: Standing   SLS  Eyes open;Foam/compliant surface;10 secs;Other reps (comment) 4 reps/LE, cues to keep hips level    Rockerboard  Anterior/posterior;Other (comment) ball toss x30 reps while staying in the middles    Tandem Gait  4 reps;Foam/compliant surface;Forward No UE support  with cues for technique.    Other Standing Exercises  Performed in // bars: Ball toss on airex x50 reps in all directions (min guard to min A provided by PT) with pt's dtr tossing ball. Then ball toss with pt naming foods by the alphabet, with extensive cues and incr. time. Pt also performed agility/balance training in ladder x4 reps/activity: stepping inside ladder and then outside (one foot at a time), then hopping outside of ladder, then back inside. Cues and demo for technique, with pt incr. speed with each trial. Min guard to min A for safety.  Incr. Postural sway with incr. Speed during ladder activities.                      PT Short Term Goals -  05/03/18 1313      PT SHORT TERM GOAL #1   Title  =LTGs        PT Long Term Goals - 05/03/18 1313      PT LONG TERM GOAL #1   Title  Pt will be independent with final HEP in order to indicate decreased fall risk and improved functional mobility.  (Target date: 06/02/18)    Time  4    Period  Weeks    Status  New    Target Date  06/02/18      PT LONG TERM GOAL #2   Title  Pt will improve FGA to >/=28/30 in order to indicate decreased fall risk.      Time  4    Period  Weeks    Status  New      PT LONG TERM GOAL #3   Title  Pt will perform floor to stand tranfser without support and with no report of dizziness/light headedness in order to return to preferred prayer position.      Time  4    Period  Weeks    Status  New      PT LONG TERM GOAL #4   Title  Pt will ambulate >1000' over varying outdoor surfaces while engaging in dual task without overt LOB and with increased attention to R environment in order to return to community and leisure activity.     Time  4    Period  Weeks    Status  New            Plan - 05/22/18 1153    Clinical Impression Statement  Today's skilled session focused on high level balance activities to improve righting reaction, strength, and balance. PT included cognitive tasks during ball toss on compliant surface to incorporate dual tasking, pt continues to require incr. time and extensive cues during cognitive tasks. Pt progressed to min guard during stepping strategy activity. Continue with POC.     Rehab Potential  Good    Clinical Impairments Affecting Rehab Potential  R inattention and R field cut    PT Frequency  2x / week    PT Duration  4 weeks    PT Treatment/Interventions  ADLs/Self Care Home Management;Gait training;Stair training;Functional mobility training;Therapeutic activities;Therapeutic exercise;Balance training;Neuromuscular re-education;Cognitive remediation;Patient/family education;Energy conservation    PT Next Visit Plan   continue agility ladder, Add to HEP as needed, dual tasking, outdoor gait/balance tasks. LTGS DUE ON 06/02/18    Consulted and Agree with Plan of Care  Patient;Family member/caregiver    Family Member Consulted  daughter       Patient will benefit from skilled therapeutic intervention in order to improve  the following deficits and impairments:  Decreased activity tolerance, Decreased balance, Decreased safety awareness, Decreased coordination, Impaired vision/preception  Visit Diagnosis: Unsteadiness on feet  Other abnormalities of gait and mobility  Muscle weakness (generalized)     Problem List Patient Active Problem List   Diagnosis Date Noted  . DVT, lower extremity, distal, acute, right ([place]) 04/25/2018  . Acute lower UTI   . Atelectasis   . Hypoalbuminemia due to protein-calorie malnutrition ([place])   . Left temporal lobe hemorrhage ([place]) 04/15/2018  . Vascular headache   . Seizure prophylaxis   . Cerebral edema (HCC)   . Atrial fibrillation with rapid ventricular response ([place])   . Pneumonia   . Diabetes mellitus type 2 in nonobese (HCC)   . Benign essential HTN   . History of pancreatic cancer   . FUO (fever of unknown origin)   . Dysphagia, post-stroke   . Tachycardia   . Tachypnea   . Hypothyroidism   . Hypernatremia   . Hypokalemia   . Acute blood loss anemia   . Thrombocytopenia ([place])   . Intracranial hemorrhage ([place]) 04/05/2018  . Acute pancreatitis 06/11/2014  . Headache 06/11/2014  . 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 Ampullary carcinoma ([place]) 12/06/2012  . Obstructive jaundice 11/11/2012  . Biliary stricture 11/11/2012  . Diabetes mellitus ([place]) 11/10/2012  . HTN (hypertension) 11/10/2012  . Pancreatitis 11/10/2012  . Abdominal pain 11/10/2012    Hector Pugh 05/22/2018, 12:00 PM  [facility] [address] [place], [place], [address] Phone: [phone]   Fax:   [phone]  Name: Hector Pugh MRN: [id number] Date of Birth: 08-19-1961  Geoffry Paradise, PT,DPT 05/22/18 12:01 PM Phone: [phone] Fax: [phone]

## 2018-05-22 NOTE — Therapy (Signed)
[facility] [address] [place], [place], [address] Phone: [phone]   Fax:  [phone]  Speech Language Pathology Treatment  Patient Details  Name: Hector Pugh MRN: [id number] Date of Birth: 04-20-1961 Referring Provider: Delice Lesch, MD   Encounter Date: 05/22/2018  End of Session - 05/22/18 1244    Visit Number  3    Number of Visits  17    Date for SLP Re-Evaluation  08/01/18    Authorization Type  pre-auth - [employer]    Authorization Time Period  05-10-18 to 11-09-18    Authorization - Visit Number  3    Authorization - Number of Visits  25    SLP Start Time  7425    SLP Stop Time   1231    SLP Time Calculation (min)  43 min    Activity Tolerance  Patient tolerated treatment well       Past Medical History:  Diagnosis Date  . 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 Cancer ([place])   . Diabetes mellitus ([place]) 11/10/2012  . Diabetes mellitus without complication (HCC)    diet controlled  . HTN (hypertension) 11/10/2012  . Hypertension   . 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 Pancreatic cancer ([place])   . Pancreatitis 11/10/2012  . Seasonal allergies     Past Surgical History:  Procedure Laterality Date  . circucision    . circumsision    . COLONOSCOPY N/A 04/04/2018   Procedure: COLONOSCOPY;  Surgeon: Leighton Ruff, MD;  [facility];  Service: Endoscopy;  Laterality: N/A;  . CRANIOTOMY Left 04/05/2018   Procedure: Left Temporal CRANIOTOMY HEMATOMA EVACUATION of Intracranial Hemorrhage;  Surgeon: Ashok Pall, MD;  Location: [facility];  Service: Neurosurgery;  Laterality: Left;  . ERCP  11/08/2012   Procedure: ENDOSCOPIC RETROGRADE CHOLANGIOPANCREATOGRAPHY (ERCP);  Surgeon: Jeryl Columbia, MD;  Location[facility];  Service: Gastroenterology;  Laterality: N/A;  . EUS  11/08/2012   Procedure: UPPER ENDOSCOPIC ULTRASOUND (EUS) LINEAR;  Surgeon: Arta Silence, MD;   [facility];  Service: Endoscopy;  Laterality: N/A;  . FINE NEEDLE ASPIRATION  11/08/2012   Procedure: FINE NEEDLE ASPIRATION (FNA) LINEAR;  Surgeon: Arta Silence, MD;  [facility];  Service: Endoscopy;  Laterality: N/A;  . HERNIA REPAIR     RIGHT  . LAPAROSCOPY  12/12/2012   Procedure: LAPAROSCOPY DIAGNOSTIC;  Surgeon: Stark Klein, MD;  [facility];  Service: General;  Laterality: N/A;  . Left Foot Surgery    . Left Foot Surgery    . VASECTOMY    . WHIPPLE PROCEDURE  12/12/2012   Procedure: WHIPPLE PROCEDURE;  Surgeon: Stark Klein, MD;  [facility];  Service: General;  Laterality: N/A;    There were no vitals filed for this visit.  Subjective Assessment - 05/22/18 1204    Subjective  "He had the cards we put in and I told him about them"    Currently in Pain?  No/denies            ADULT SLP TREATMENT - 05/22/18 1211      General Information   Behavior/Cognition  Alert;Pleasant mood;Cooperative;Impulsive      Treatment Provided   Treatment provided  Cognitive-Linquistic      Cognitive-Linquistic Treatment   Treatment focused on  Aphasia    Skilled Treatment  Facilitated auditory comprehension of 3-4 phrases/sentences describing simple animal for pt to ID (convergent naming). Pt reqired reduced  rate, pauses and occasional repetitions and gestures. Pt required usual phonemic, written cues and imitation for convergent naming  6/10. Pt named and wrote 6 items in category with occasional min A for naming, and usual min to mod A to ID and correct written errors. Pt also required onging cues to reduce impulsivity and speed.       Assessment / Recommendations / Plan   Plan  Continue with current plan of care      Progression Toward Goals   Progression toward goals  Progressing toward goals         SLP Short Term Goals - 05/22/18 1244      SLP SHORT TERM GOAL #1   Title  pt will generate sentence responses in min-mod complex language tasks 80%  accuracy with min-mod A occasionally for error awareness    Time  4    Period  Weeks ot 9 total sessions, for all STGs    Status  On-going      SLP SHORT TERM GOAL #2   Title  pt will read sentences of 8-9 words with error awareness at 90% over three sessions    Time  4    Period  Weeks    Status  On-going      SLP SHORT TERM GOAL #3   Title  pt will demo understanding of spoken sentence level stimuli with 80% success over three sesions    Time  4    Period  Weeks    Status  On-going      SLP SHORT TERM GOAL #4   Title  pt will complete cognitive linguistic testing within first 4 therapy sessions    Time  2    Period  Weeks or 5 total sessions    Status  On-going      SLP SHORT TERM GOAL #5   Title  pt will complete a HEP for increasing accuracy of his verbal output with 90% success with rare min A for error awareness    Time  4    Period  Weeks    Status  On-going       SLP Long Term Goals - 05/22/18 1244      SLP LONG TERM GOAL #1   Title  pt will complete a HEP for improving accuracy of his verbal output with 90% success over three sessions    Time  8    Period  Weeks or 17 sessions    Status  On-going      SLP LONG TERM GOAL #2   Title  pt will participate in 8 minutes of mod complex conversation appropriately, with modified independence    Time  8    Period  Weeks or 17 sessions    Status  On-going      SLP LONG TERM GOAL #3   Title  pt will demo understanding of 8 minutes mod complex conversation over three visits, with modified independence    Time  8    Period  Weeks or 17 visits    Status  On-going      SLP LONG TERM GOAL #4   Title  pt will demo understanding of 15 minutes mod complex/complex conversation over three visits, with modified independence    Time  12    Period  Weeks or 25 total visits    Status  On-going      SLP LONG TERM GOAL #5   Title  pt will demo awareness of  verbal speech errors by correcting 95% of errors in 15 minutes mod  complex/complex conversation over two sessions    Time  12    Period  Weeks or 25 visits    Status  On-going       Plan - 05/22/18 1244    Clinical Impression Statement  [name] presents today with mod aphasia (mod expressive, mod receptive) which is less of a deficit in his native language. Pt also continues to exhibit some degree of cognitive-linguistic deficits- seen in decr'd sustained/selective attention. Written and reading tasks are complicated by rt inattention. and rt field cut. He would benefit from skilled ST targeting langauge skills (recpetive and expressive), and from a cognitive linguistic evaluation and probable goals generated, as OT is not directly addressing this in their plan of care.     Speech Therapy Frequency  2x / week    Treatment/Interventions  Internal/external aids;Patient/family education;Compensatory strategies;SLP instruction and feedback;Functional tasks;Cognitive reorganization;Cueing hierarchy;Environmental controls;Language facilitation    Potential to Achieve Goals  Good    Consulted and Agree with Plan of Care  Patient;Family member/caregiver    Family Member Consulted  daughter       Patient will benefit from skilled therapeutic intervention in order to improve the following deficits and impairments:   Aphasia  Cognitive communication deficit    Problem List Patient Active Problem List   Diagnosis Date Noted  . DVT, lower extremity, distal, acute, right ([place]) 04/25/2018  . Acute lower UTI   . Atelectasis   . Hypoalbuminemia due to protein-calorie malnutrition ([place])   . Left temporal lobe hemorrhage ([place]) 04/15/2018  . Vascular headache   . Seizure prophylaxis   . Cerebral edema (HCC)   . Atrial fibrillation with rapid ventricular response ([place])   . Pneumonia   . Diabetes mellitus type 2 in nonobese (HCC)   . Benign essential HTN   . History of pancreatic cancer   . FUO (fever of unknown origin)   . Dysphagia, post-stroke   .  Tachycardia   . Tachypnea   . Hypothyroidism   . Hypernatremia   . Hypokalemia   . Acute blood loss anemia   . Thrombocytopenia ([place])   . Intracranial hemorrhage ([place]) 04/05/2018  . Acute pancreatitis 06/11/2014  . Headache 06/11/2014  . 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 Obstructive jaundice 11/11/2012  . Biliary stricture 11/11/2012  . Diabetes mellitus ([place]) 11/10/2012  . HTN (hypertension) 11/10/2012  . Pancreatitis 11/10/2012  . Abdominal pain 11/10/2012    Lovvorn, Annye Rusk MS, CCC-SLP 05/22/2018, 12:45 PM  [facility] [address] [place], [place], [address] Phone: [phone]   Fax:  [phone]   Name: Hector Pugh MRN: [id number] Date of Birth: 1961/08/05

## 2018-05-22 NOTE — Patient Outreach (Signed)
[facility]) Care Management  05/22/2018  Bhargav Barbaro 23-Mar-1961 [id number]   EMMI:stroke red alert Referral date:05/10/18 Referral reason:questions/ problems with medications Insurance: [employer] Day #9 Attempt #3  Third ttelephone call to patientregarding EMMI stroke red alert. HIPAA compliant voice message left with call back phone number.  PLAN:If no response from patient will proceed with case closure  Quinn Plowman RN,BSN,CCM [facility] Telephonic  [phone]

## 2018-05-24 ENCOUNTER — Other Ambulatory Visit: Payer: Self-pay

## 2018-05-24 NOTE — Patient Outreach (Signed)
[facility]) Care Management  05/24/2018  Hector Pugh 03-15-1961 [id number]  No response from patient after 3 telephone calls attempts.  PLAN;  RNCM will close patient due to being unable to reach   Quinn Plowman RN,BSN,CCM [facility] Telephonic  [phone]

## 2018-05-28 ENCOUNTER — Ambulatory Visit: Payer: Managed Care, Other (non HMO)

## 2018-05-28 ENCOUNTER — Ambulatory Visit: Payer: Managed Care, Other (non HMO) | Admitting: Occupational Therapy

## 2018-05-28 DIAGNOSIS — R41842 Visuospatial deficit: Secondary | ICD-10-CM

## 2018-05-28 DIAGNOSIS — R414 Neurologic neglect syndrome: Secondary | ICD-10-CM

## 2018-05-28 DIAGNOSIS — R2681 Unsteadiness on feet: Secondary | ICD-10-CM

## 2018-05-28 DIAGNOSIS — R2689 Other abnormalities of gait and mobility: Secondary | ICD-10-CM

## 2018-05-28 DIAGNOSIS — R41840 Attention and concentration deficit: Secondary | ICD-10-CM

## 2018-05-28 NOTE — Patient Instructions (Signed)
Tandem Walking    Do beside counter top for support if needed. Walk with each foot directly in front of other, heel of one foot touching toes of other foot with each step. Both feet straight ahead. Do 8-10 feet x 3 reps.

## 2018-05-28 NOTE — Therapy (Signed)
[facility] [address] [place], [place], [address] Phone: [phone]   Fax:  [phone]  Physical Therapy Treatment  Patient Details  Name: Hector Pugh MRN: [id number] Date of Birth: 1961-03-08 Referring Provider: Dr. Delice Lesch   Encounter Date: 05/28/2018  PT End of Session - 05/28/18 1125    Visit Number  6    Number of Visits  9    Date for PT Re-Evaluation  06/02/18    Authorization Type  [employer]    PT Start Time  0850    PT Stop Time  0929    PT Time Calculation (min)  39 min    Equipment Utilized During Treatment  -- min guard to S prn    Activity Tolerance  Patient tolerated treatment well    Behavior During Therapy  [facility] for tasks assessed/performed       Past Medical History:  Diagnosis Date  . 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 Cancer ([place])   . Diabetes mellitus ([place]) 11/10/2012  . Diabetes mellitus without complication (HCC)    diet controlled  . HTN (hypertension) 11/10/2012  . Hypertension   . 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 Pancreatic cancer ([place])   . Pancreatitis 11/10/2012  . Seasonal allergies     Past Surgical History:  Procedure Laterality Date  . circucision    . circumsision    . COLONOSCOPY N/A 04/04/2018   Procedure: COLONOSCOPY;  Surgeon: Leighton Ruff, MD;  Location: [facility];  Service: Endoscopy;  Laterality: N/A;  . CRANIOTOMY Left 04/05/2018   Procedure: Left Temporal CRANIOTOMY HEMATOMA EVACUATION of Intracranial Hemorrhage;  Surgeon: Ashok Pall, MD;  Location: [place];  Service: Neurosurgery;  Laterality: Left;  . ERCP  11/08/2012   Procedure: ENDOSCOPIC RETROGRADE CHOLANGIOPANCREATOGRAPHY (ERCP);  Surgeon: Jeryl Columbia, MD;  Location: [facility];  Service: Gastroenterology;  Laterality: N/A;  . EUS  11/08/2012   Procedure: UPPER ENDOSCOPIC ULTRASOUND (EUS) LINEAR;  Surgeon: Arta Silence, MD;  Location: [facility];   Service: Endoscopy;  Laterality: N/A;  . FINE NEEDLE ASPIRATION  11/08/2012   Procedure: FINE NEEDLE ASPIRATION (FNA) LINEAR;  Surgeon: Arta Silence, MD;  Location: [facility];  Service: Endoscopy;  Laterality: N/A;  . HERNIA REPAIR     RIGHT  . LAPAROSCOPY  12/12/2012   Procedure: LAPAROSCOPY DIAGNOSTIC;  Surgeon: Stark Klein, MD;  Location: [facility];  Service: General;  Laterality: N/A;  . Left Foot Surgery    . Left Foot Surgery    . VASECTOMY    . WHIPPLE PROCEDURE  12/12/2012   Procedure: WHIPPLE PROCEDURE;  Surgeon: Stark Klein, MD;  Location: [facility];  Service: General;  Laterality: N/A;    There were no vitals filed for this visit.  Subjective Assessment - 05/28/18 0853    Subjective  Pt denied falls or changes since last visit. Pt's dtr reported pt's yard has a steep decline with holes in yard, and pt is imbalanced.     Patient is accompained by:  -- Dtr in lobby    How long can you walk comfortably?  Has been able to ambulate in stores without issue.      Patient Stated Goals  "To move as best I can."     Currently in Pain?  No/denies         [facility] PT Assessment - 05/28/18 0854      Functional Gait  Assessment   Gait assessed  Yes    Gait Level Surface  Walks 20 ft in less than 5.5 sec, no assistive devices, good speed, no evidence for imbalance, normal gait pattern, deviates no more than 6 in outside of the 12 in walkway width. 4.41 sec.     Change in Gait Speed  Able to smoothly change walking speed without loss of balance or gait deviation. Deviate no more than 6 in outside of the 12 in walkway width.    Gait with Horizontal Head Turns  Performs head turns smoothly with slight change in gait velocity (eg, minor disruption to smooth gait path), deviates 6-10 in outside 12 in walkway width, or uses an assistive device.    Gait with Vertical Head Turns  Performs task with slight change in gait velocity (eg, minor disruption to smooth gait path), deviates 6 - 10 in outside 12  in walkway width or uses assistive device    Gait and Pivot Turn  Pivot turns safely within 3 sec and stops quickly with no loss of balance.    Step Over Obstacle  Is able to step over 2 stacked shoe boxes taped together (9 in total height) without changing gait speed. No evidence of imbalance.    Gait with Narrow Base of Support  Is able to ambulate for 10 steps heel to toe with no staggering.    Gait with Eyes Closed  Walks 20 ft, no assistive devices, good speed, no evidence of imbalance, normal gait pattern, deviates no more than 6 in outside 12 in walkway width. Ambulates 20 ft in less than 7 sec.    Ambulating Backwards  Walks 20 ft, no assistive devices, good speed, no evidence for imbalance, normal gait    Steps  Alternating feet, no rail.    Total Score  28    FGA comment:  28/30: indicates pt is at low falls risk.                    [facility] Adult PT Treatment/Exercise - 05/28/18 0854      Transfers   Floor to Transfer  7: Independent    Floor to Transfer Details (indicate cue type and reason)  Pt performed stand<>floor txfs on red mat with shoes, x2 reps. No imbalance noted.       Ambulation/Gait   Ambulation/Gait  Yes    Ambulation/Gait Assistance  4: Min guard;5: Supervision    Ambulation/Gait Assistance Details  Pt performed dual tasking while amb. outdoors over uneven terrain. Pt performed head turns, counting up by 2's and counting backwards by 2's. Pt required S to ensure safety over grassy terrain. Cues for how to perform tasks.     Ambulation Distance (Feet)  1000 Feet    Assistive device  None    Gait Pattern  Within Functional Limits    Ambulation Surface  Level;Indoor          Balance Exercises - 05/28/18 1124      Balance Exercises: Standing   Other Standing Exercises  Performed in //bars with S for safety: tandem gait 4x10' with cues to improve anterior weight shifting. Please see pt instructions for HEP details.         PT Education - 05/28/18  1124    Education Details  PT discussed goal progress and that primary PT and pt will discuss POC next session but that pt will likely only require a few more PT visits to address high level balance/dual tasking.     Person(s)  Educated  Patient;Child(ren)    Methods  Explanation    Comprehension  Verbalized understanding       PT Short Term Goals - 05/03/18 1313      PT SHORT TERM GOAL #1   Title  =LTGs        PT Long Term Goals - 05/28/18 1127      PT LONG TERM GOAL #1   Title  Pt will be independent with final HEP in order to indicate decreased fall risk and improved functional mobility.  (Target date: 06/02/18)    Time  4    Period  Weeks    Status  New      PT LONG TERM GOAL #2   Title  Pt will improve FGA to >/=28/30 in order to indicate decreased fall risk.      Time  4    Period  Weeks    Status  Achieved      PT LONG TERM GOAL #3   Title  Pt will perform floor to stand tranfser without support and with no report of dizziness/light headedness in order to return to preferred prayer position.      Time  4    Period  Weeks    Status  Achieved      PT LONG TERM GOAL #4   Title  Pt will ambulate >1000' over varying outdoor surfaces while engaging in dual task without overt LOB and with increased attention to R environment in order to return to community and leisure activity.     Time  4    Period  Weeks    Status  Achieved            Plan - 05/28/18 1125    Clinical Impression Statement  Pt demonstrated progress as he met LTGs 2, and 3. Pt partially met LTG 4, as he requires S to ensure safety and frequent cues during dual tasking 2/2 cognitive impairments. Incr. time during testing to allow for comprehension, verbal/tactile cues and demo required. Pt noted to experience incr. postural sway during amb. downhill over grassy terrain but was able to maintain balance by decr. speed. PT will finishing assessing LTG 1 (HEP) next session and determine ongoing POC.      Rehab Potential  Good    Clinical Impairments Affecting Rehab Potential  R inattention and R field cut    PT Frequency  2x / week    PT Duration  4 weeks    PT Treatment/Interventions  ADLs/Self Care Home Management;Gait training;Stair training;Functional mobility training;Therapeutic activities;Therapeutic exercise;Balance training;Neuromuscular re-education;Cognitive remediation;Patient/family education;Energy conservation    PT Next Visit Plan  Finish assessing LTG 1 (HEP) and determine how additonal visits. continue agility ladder, Add to HEP as needed, dual tasking, outdoor gait/balance tasks.     Consulted and Agree with Plan of Care  Patient;Family member/caregiver    Family Member Consulted  daughter       Patient will benefit from skilled therapeutic intervention in order to improve the following deficits and impairments:  Decreased activity tolerance, Decreased balance, Decreased safety awareness, Decreased coordination, Impaired vision/preception  Visit Diagnosis: Other abnormalities of gait and mobility  Unsteadiness on feet     Problem List Patient Active Problem List   Diagnosis Date Noted  . DVT, lower extremity, distal, acute, right ([place]) 04/25/2018  . Acute lower UTI   . Atelectasis   . Hypoalbuminemia due to protein-calorie malnutrition ([place])   . Left temporal lobe hemorrhage ([place]) 04/15/2018  .  Vascular headache   . Seizure prophylaxis   . Cerebral edema (H[facility])   . Atrial fibrillation with rapid ventricular response ([place])   . Pneumonia   . Diabetes mellitus type 2 in nonobese (H[facility])   . Benign essential HTN   . History of pancreatic cancer   . FUO (fever of unknown origin)   . Dysphagia, post-stroke   . Tachycardia   . Tachypnea   . Hypothyroidism   . Hypernatremia   . Hypokalemia   . Acute blood loss anemia   . Thrombocytopenia ([place])   . Intracranial hemorrhage ([place]) 04/05/2018  . Acute pancreatitis 06/11/2014  . Headache 06/11/2014  . Exocrine  pancreatic insufficiency 03/11/2013  . 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 Obstructive jaundice 11/11/2012  . Biliary stricture 11/11/2012  . Diabetes mellitus ([place]) 11/10/2012  . HTN (hypertension) 11/10/2012  . Pancreatitis 11/10/2012  . Abdominal pain 11/10/2012    Lerlene Treadwell L 05/28/2018, 11:29 AM  [facility] [address] [place], [place], [address] Phone: [phone]   Fax:  [phone]  Name: Hector Pugh MRN: [id number] Date of Birth: 1961/05/03  Geoffry Paradise, PT,DPT 05/28/18 11:30 AM Phone: [phone] Fax: [phone]

## 2018-05-29 ENCOUNTER — Ambulatory Visit: Payer: Managed Care, Other (non HMO) | Admitting: Adult Health

## 2018-05-29 ENCOUNTER — Ambulatory Visit: Payer: Managed Care, Other (non HMO) | Admitting: Speech Pathology

## 2018-05-29 ENCOUNTER — Encounter: Payer: Self-pay | Admitting: Adult Health

## 2018-05-29 ENCOUNTER — Encounter: Payer: Self-pay | Admitting: Speech Pathology

## 2018-05-29 VITALS — BP 100/67 | HR 52 | Ht 62.0 in | Wt 149.4 lb

## 2018-05-29 DIAGNOSIS — I611 Nontraumatic intracerebral hemorrhage in hemisphere, cortical: Secondary | ICD-10-CM

## 2018-05-29 DIAGNOSIS — I10 Essential (primary) hypertension: Secondary | ICD-10-CM | POA: Diagnosis not present

## 2018-05-29 DIAGNOSIS — E119 Type 2 diabetes mellitus without complications: Secondary | ICD-10-CM | POA: Diagnosis not present

## 2018-05-29 DIAGNOSIS — R41841 Cognitive communication deficit: Secondary | ICD-10-CM

## 2018-05-29 DIAGNOSIS — R41842 Visuospatial deficit: Secondary | ICD-10-CM | POA: Diagnosis not present

## 2018-05-29 DIAGNOSIS — I824Z1 Acute embolism and thrombosis of unspecified deep veins of right distal lower extremity: Secondary | ICD-10-CM

## 2018-05-29 DIAGNOSIS — R4701 Aphasia: Secondary | ICD-10-CM

## 2018-05-29 NOTE — Progress Notes (Signed)
[facility] [address] [place]. [place] [address] [phone]       OFFICE FOLLOW UP NOTE  [name] Date of Birth:  1961/11/06 Medical Record Number:  [id number]   Reason for Referral:  hospital ICH follow up  CHIEF COMPLAINT:  Chief Complaint  Patient presents with  . Follow-up    Large hemorrhage left temporal lobe follow up, pt saw Dr. Erlinda Pugh pt with Hector Pugh his son    HPI: Hector Pugh is being seen today for initial visit in the office for Hector Pugh on 04/05/18. History obtained from patient and chart review. Reviewed all radiology images and labs personally.  Hector Pugh a 57 y.o.malewith a history of DM, s/p whipple in 2012 who was lastknown well before he went to bed last night 04/05/2018 around 8:30 PM.When his wife left for work at [employer] 04/06/2018, she noticed that he was slurring his speech, however he did not have any clear deficits. He went downstairs sometime between 5 and 8:30 AM and started to make tea.Subsequently when his daughter arrived home about 8:30 AM he was in bed with vomit and not moving his right side as well. He has brought to the emergency department as a code stroke where CT revealed large temporal hemorrhage. Premorbid modified rankin scale:0. ICH Score:0. Dr. Christella Pugh, neurosurgeon, consulted and took to the OR for a left temporal craniotomy for hematoma evacuation.He was taken to OR emergently for left craniotomy and evacuation of hemorrhage by Dr. Christella Pugh. Post op placed on Keppra for seizure prophylaxis and started on IV Zosyn due to concerns of aspiration PNA and completed treatment on 5/7.  Hospital course significant for cerebral edema requiring hypertonic saline, coagulopathy with drop in platelets to 113 and INR 1.4, A fib with RVR and labile BP. He was weaned off cleviprex by 5/8 and hypertonic saline by 5/9.  Serial CCT monitored with CT 5/9 showing evolving left temporal lobe with decrease in volume, increase in  localized vasogenic edema with partial effacement of left lateral ventricle, stable infarct in left posterior temporal occipital lobe and no hydrocephalus.  He did develop lethargy later that day and hypertonic saline as well as Cleviprex resumed.  CT head on 5/11 reviewed, stable.  Dr. Christella Pugh evaluated films and felt that no significant changes noted and no need to check further CT as patient neurologically stable. Hypertonic saline and clevidipine weaned off and BP/neuro status stable.    Patient was admitted to CIR on 04/15/2018 and discharged home on 04/27/2018.  Recommended continuation of Keppra for 2 weeks total which was completed prior to being discharged from CIR and was seizure-free during his day.  He continued to have visual deficits with impaired safety awareness and receptive aphasia according to discharge notes and recommended continuing outpatient PT/OT/ST.  Patient was also deemed safe to use restart Eliquis for atrial fibrillation management.  Patient is being seen today for hospital follow-up and is accompanied by his son.  He continues to participate in PT/OT/ST at our neuro rehab clinic.  States his right side has been improving but continues to have visual deficits with aphasia.  He continues take Eliquis without bleeding or bruising.  Blood pressure satisfactory at 100/62.  Patient has gradually started to participate in previous activities except for driving due to visual loss.  Denies worsening vision or headaches.  Denies new or worsening stroke/TIA symptoms at this time.   ROS:   14 system review of systems performed and negative with exception of right vision loss,  right inattention and aphasia  PMH:  Past Medical History:  Diagnosis Date  . 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 Cancer ([place])   . Diabetes mellitus ([place]) 11/10/2012  . Diabetes mellitus without complication (HCC)    diet controlled  . HTN (hypertension) 11/10/2012  . Hypertension   .  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 Pancreatic cancer ([place])   . Pancreatitis 11/10/2012  . Seasonal allergies     PSH:  Past Surgical History:  Procedure Laterality Date  . circucision    . circumsision    . COLONOSCOPY N/A 04/04/2018   Procedure: COLONOSCOPY;  Surgeon: Leighton Ruff, MD;  Location: [facility];  Service: Endoscopy;  Laterality: N/A;  . CRANIOTOMY Left 04/05/2018   Procedure: Left Temporal CRANIOTOMY HEMATOMA EVACUATION of Intracranial Hemorrhage;  Surgeon: Ashok Pall, MD;  Location: [facility];  Service: Neurosurgery;  Laterality: Left;  . ERCP  11/08/2012   Procedure: ENDOSCOPIC RETROGRADE CHOLANGIOPANCREATOGRAPHY (ERCP);  Surgeon: Jeryl Columbia, MD;  Location: [facility];  Service: Gastroenterology;  Laterality: N/A;  . EUS  11/08/2012   Procedure: UPPER ENDOSCOPIC ULTRASOUND (EUS) LINEAR;  Surgeon: Arta Silence, MD;  Location: [facility];  Service: Endoscopy;  Laterality: N/A;  . FINE NEEDLE ASPIRATION  11/08/2012   Procedure: FINE NEEDLE ASPIRATION (FNA) LINEAR;  Surgeon: Arta Silence, MD;  Location: [facility];  Service: Endoscopy;  Laterality: N/A;  . HERNIA REPAIR     RIGHT  . LAPAROSCOPY  12/12/2012   Procedure: LAPAROSCOPY DIAGNOSTIC;  Surgeon: Stark Klein, MD;  Location: [facility];  Service: General;  Laterality: N/A;  . Left Foot Surgery    . Left Foot Surgery    . VASECTOMY    . WHIPPLE PROCEDURE  12/12/2012   Procedure: WHIPPLE PROCEDURE;  Surgeon: Stark Klein, MD;  Location: [facility];  Service: General;  Laterality: N/A;    Social History:  Social History   Socioeconomic History  . Marital status: Married    Spouse name: Not on file  . Number of children: Not on file  . Years of education: Not on file  . Highest education level: Not on file  Occupational History  . Not on file  Social Needs  . Financial resource strain: Not on file  . Food insecurity:    Worry: Not on file    Inability: Not on file  . Transportation needs:     Medical: Not on file    Non-medical: Not on file  Tobacco Use  . Smoking status: Never Smoker  . Smokeless tobacco: Never Used  Substance and Sexual Activity  . Alcohol use: No  . Drug use: No  . Sexual activity: Never  Lifestyle  . Physical activity:    Days per week: Not on file    Minutes per session: Not on file  . Stress: Not on file  Relationships  . Social connections:    Talks on phone: Not on file    Gets together: Not on file    Attends religious service: Not on file    Active member of club or organization: Not on file    Attends meetings of clubs or organizations: Not on file    Relationship status: Not on file  . Intimate partner violence:    Fear of current or ex partner: Not on file    Emotionally abused: Not on file    Physically abused: Not on file    Forced sexual activity: Not on file  Other Topics Concern  . Not on file  Social History Narrative  . Not on file    Family History:  Family History  Problem Relation Age of Onset  . Cancer Mother        esophageal carcinoma    Medications:   Current Outpatient Medications on File Prior to Visit  Medication Sig Dispense Refill  . acetaminophen (TYLENOL) 500 MG tablet Take 500 mg by mouth every 4 (four) hours as needed for moderate pain or headache.     Marland Kitchen amiodarone (PACERONE) 200 MG tablet Take 1 tablet (200 mg total) by mouth daily. 30 tablet 0  . amLODipine (NORVASC) 10 MG tablet Take 1 tablet (10 mg total) by mouth daily. 30 tablet 0  . apixaban (ELIQUIS) 5 MG TABS tablet Take 1 tablet (5 mg total) by mouth 2 (two) times daily. 60 tablet 0  . butalbital-acetaminophen-caffeine (FIORICET, ESGIC) 50-325-40 MG tablet Take 1 tablet by mouth every 8 (eight) hours as needed for headache. 14 tablet 0  . glipiZIDE (GLUCOTROL XL) 5 MG 24 hr tablet Take 1 tablet (5 mg total) by mouth daily with breakfast. 30 tablet 0  . levothyroxine (SYNTHROID, LEVOTHROID) 50 MCG tablet Take 1 tablet (50 mcg total) by mouth  daily before breakfast. 30 tablet 0  . losartan (COZAAR) 50 MG tablet Take 1 tablet (50 mg total) by mouth 2 (two) times daily. 60 tablet 0  . methocarbamol (ROBAXIN) 500 MG tablet Take 1 tablet (500 mg total) by mouth 2 (two) times daily as needed for muscle spasms. 60 tablet 1  . metoprolol tartrate (LOPRESSOR) 100 MG tablet Take 1 tablet (100 mg total) by mouth 2 (two) times daily. 60 tablet 0  . pantoprazole (PROTONIX) 40 MG tablet Take 1 tablet (40 mg total) by mouth daily. 30 tablet 0  . protein supplement shake (PREMIER PROTEIN) LIQD Take 325 mLs (11 oz total) by mouth 3 (three) times daily with meals.  0  . senna (SENOKOT) 8.6 MG TABS tablet Take 1 tablet (8.6 mg total) by mouth daily. 30 each 0  . tamsulosin (FLOMAX) 0.4 MG CAPS capsule Take 1 capsule (0.4 mg total) by mouth daily. 30 capsule 0   No current facility-administered medications on file prior to visit.     Allergies:  No Known Allergies   Physical Exam  Vitals:   05/29/18 1026  BP: 100/67  Pulse: (!) 52  Weight: 149 lb 6.4 oz (67.8 kg)  Height: 5\' 2"  (1.575 m)   Body mass index is 27.33 kg/m. No exam data present  General: well developed, well nourished, pleasant middle-aged male, seated, in no evident distress Head: head normocephalic and atraumatic.   Neck: supple with no carotid or supraclavicular bruits Cardiovascular: regular rate and rhythm, no murmurs Musculoskeletal: no deformity Skin:  no rash/petichiae Vascular:  Normal pulses all extremities  Neurologic Exam Mental Status: Awake and fully alert.  Expressive aphasia present.  Oriented to place and time. Recent and remote memory intact. Attention span, concentration and fund of knowledge appropriate. Mood and affect appropriate.  Cranial Nerves: Fundoscopic exam reveals sharp disc margins. Pupils equal, briskly reactive to light. Extraocular movements full without nystagmus.  Right contralateral homonymous hemianopia. Hearing intact. Facial  sensation intact.  Right mild facial paralysis. Motor: Normal bulk and tone. Normal strength in all tested extremity muscles. Sensory.: intact to touch , pinprick , position and vibratory sensation.  Coordination: Rapid alternating movements normal in all extremities. Finger-to-nose and heel-to-shin performed accurately bilaterally. Gait and  Station: Arises from chair without difficulty. Stance is normal. Gait demonstrates normal stride length and balance . Able to heel, toe and tandem walk without difficulty.  Reflexes: 1+ and symmetric. Toes downgoing.    NIHSS  4 Modified Rankin  2 HAS-BLED 2 CHA2DS2-VASc 4   Diagnostic Data (Labs, Imaging, Testing)  Ct Head Code Stroke Wo Contrast 04/05/2018 0944 1. Large hemorrhage left temporal lobe with surrounding edema and mass-effect. Intraventricular hemorrhage with mild hydrocephalus. 13 mm midline shift. Etiologies of hemorrhage are not determined on this study but could be due to hypertension, cerebral amyloid, vascular malformation, or less likely tumor. 2. ASPECTS is 10   Ct Angio Head W Or Wo Contrast 04/05/2018  1112 Stable LEFT temporal lobe hemorrhage, with LEFT-to-RIGHT midline shift. Lobar hypertensive bleed is favored. No underlying vascular anomaly, or arterial/venous thrombosis/embolus.   Ct Head Wo Contrast 04/05/2018 2228 1. Postsurgical changes from interval partial evacuation of hematoma within the left temporal lobe with approximately 20 cc residual. 2. Stable small volume of intraventricular hemorrhage and small volume of subarachnoid hemorrhage predominantly over left convexity. 3. Decreased mass effect with 6 mm left-to-right midline shift, previously 13 mm. Improved patency of lateral ventricles. 4. No new acute intracranial abnormality identified.   Ct Head Wo Contrast 04/06/2018 0913 1. Similar postoperative appearance of residual blood products in the left temporal lobe following partial evacuation of the hematoma. 2.  Stable intraventricular hemorrhage without hydrocephalus. 3. 6 mm left-to-right midline shift is stable.   Ct Head Wo Contrast 04/07/2018 1552 1. Stable to slight decrease in size of residual blood products in the left temporal lobe following partial vacuo a shin. 2. Slight decrease in midline shift. 3. Stable interventricular hemorrhage without hydrocephalus.   Ct Head Wo Contrast 04/09/2018 0438 1. Stable left temporal lobe hemorrhage, 5 mm left-to-right midline shift, small volume subarachnoid hemorrhage, and small volume intraventricular hemorrhage. 2. No new acute intracranial abnormality.  Ct Head Wo Contrast 04/11/2018 0540 1. Evolving left temporal lobe hemorrhage, similar in size but decreased in attenuation as compared to 04/09/2018, with increased localized vasogenic edema and regional mass effect. Secondary left-to-right shift of 7 mm. 2. Persistent small volume intraventricular hemorrhage, slightly decreased from previous. 3. No other new acute intracranial abnormality.    TTE  04/07/2018 - Left ventricle: The cavity size was normal. Wall thickness wasnormal. Systolic function was normal. The estimated ejectionfraction was in the range of 60% to 65%. Wall motion was normal;there were no regional wall motion abnormalities. Leftventricular diastolic function parameters were normal. - Aortic valve: There was mild regurgitation. - Atrial septum: No defect or patent foramen ovale was identified. - Systemic veins: Patient ventilated so unable to accurately assessCVP with IVC assessment.  CT head wo contrast 04/13/2018 IMPRESSION: 1. Stable left anterior temporal lobe hematoma, small volume intraventricular hemorrhage, and small volume subarachnoid hemorrhage. No new hemorrhage. 2. Stable surrounding edema in the left temporal lobe, associated mass effect, 7 mm left-to-right midline shift, and left uncal herniation. 3. Stable small left lateral craniotomy postsurgical  changes.    ASSESSMENT: Hector Pugh is a 57 y.o. year old male here with ICH on 04/05/2018 secondary to hypertension. Vascular risk factors include HLD, HTN, DM and new onset A. fib.     PLAN: -Continue Eliquis (apixaban) daily  for secondary stroke prevention -Continue f/u with cardiologist regarding atrial fibrillation and Eliquis management -Continue PT/OT/ST -Patient questioning whether he can start driving -patient not cleared at this time due to continued hemianopia and questionable cognitive state  for driving.  Advised patient to continue to follow with OT and schedule appointment with ophthalmology regarding visual deficit -F/u with PCP regarding your HLD, HTN and DM management -continue to monitor BP at home -Maintain strict control of hypertension with blood pressure goal below 130/90, diabetes with hemoglobin A1c goal below 6.5% and cholesterol with LDL cholesterol (bad cholesterol) goal below 70 mg/dL. I also advised the patient to eat a healthy diet with plenty of whole grains, cereals, fruits and vegetables, exercise regularly and maintain ideal body weight.  Follow up in 3 months or call earlier if needed   Greater than 50% of time during this 25 minute visit was spent on counseling,explanation of diagnosis of ICH, reviewing risk factor management of HLD, HTN, DM and A. fib, planning of further management, discussion with patient and family and coordination of care  Venancio Poisson, [facility]  [facility] [address] [place], [place] [address]  Phone [phone] Fax [phone]

## 2018-05-29 NOTE — Therapy (Signed)
[facility] [address] [place], [place], [address] Phone: [phone]   Fax:  [phone]  Speech Language Pathology Treatment  Patient Details  Name: Hector Pugh MRN: [id number] Date of Birth: 08-Aug-1961 Referring Provider: Delice Lesch, MD   Encounter Date: 05/29/2018  End of Session - 05/29/18 1048    Visit Number  4    Number of Visits  17    Date for SLP Re-Evaluation  08/01/18    Authorization Time Period  05-10-18 to 11-09-18    Authorization - Visit Number  4    Authorization - Number of Visits  25    SLP Start Time  [phone]    SLP Stop Time   1010    SLP Time Calculation (min)  39 min    Activity Tolerance  Patient tolerated treatment well       Past Medical History:  Diagnosis Date  . 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 Cancer ([place])   . Diabetes mellitus ([place]) 11/10/2012  . Diabetes mellitus without complication (HCC)    diet controlled  . HTN (hypertension) 11/10/2012  . Hypertension   . 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 Pancreatic cancer ([place])   . Pancreatitis 11/10/2012  . Seasonal allergies     Past Surgical History:  Procedure Laterality Date  . circucision    . circumsision    . COLONOSCOPY N/A 04/04/2018   Procedure: COLONOSCOPY;  Surgeon: Leighton Ruff, MD;  [facility];  Service: Endoscopy;  Laterality: N/A;  . CRANIOTOMY Left 04/05/2018   Procedure: Left Temporal CRANIOTOMY HEMATOMA EVACUATION of Intracranial Hemorrhage;  Surgeon: Ashok Pall, MD;  Location: [facility];  Service: Neurosurgery;  Laterality: Left;  . ERCP  11/08/2012   Procedure: ENDOSCOPIC RETROGRADE CHOLANGIOPANCREATOGRAPHY (ERCP);  Surgeon: Jeryl Columbia, MD;  Location: [facility];  Service: Gastroenterology;  Laterality: N/A;  . EUS  11/08/2012   Procedure: UPPER ENDOSCOPIC ULTRASOUND (EUS) LINEAR;  Surgeon: Arta Silence, MD;  [facility];  Service:  Endoscopy;  Laterality: N/A;  . FINE NEEDLE ASPIRATION  11/08/2012   Procedure: FINE NEEDLE ASPIRATION (FNA) LINEAR;  Surgeon: Arta Silence, MD;  [facility];  Service: Endoscopy;  Laterality: N/A;  . HERNIA REPAIR     RIGHT  . LAPAROSCOPY  12/12/2012   Procedure: LAPAROSCOPY DIAGNOSTIC;  Surgeon: Stark Klein, MD;  [facility];  Service: General;  Laterality: N/A;  . Left Foot Surgery    . Left Foot Surgery    . VASECTOMY    . WHIPPLE PROCEDURE  12/12/2012   Procedure: WHIPPLE PROCEDURE;  Surgeon: Stark Klein, MD;  [facility];  Service: General;  Laterality: N/A;    There were no vitals filed for this visit.  Subjective Assessment - 05/29/18 1014    Subjective  "Still, uh you know, have trouble" re: word finding - Pt's son requests pt leave ST 5 min early due to MD appt next door)    Currently in Pain?  No/denies            ADULT SLP TREATMENT - 05/29/18 1014      General Information   Behavior/Cognition  Alert;Pleasant mood;Cooperative;Impulsive      Cognitive-Linquistic Treatment   Treatment focused on  Aphasia;Cognition    Skilled Treatment  Administered the Cognitive Linguistic Quick Test (CLQT) to further assess cognition. Results revealed moderate attention impairment,, severe memory impairment, executive function WNL, moderate language impairment, mild impairment of  visuospatial skills, and mildly impaired clock drawing. To compensate for aphasia, I presented instructions with reduced rate, pauses, gesture and word emphasis. I repeated instruction when this was allowed. Pt observed to be impulsive, attempting to start task prior to my finishing the instructions. He benefitted from cues to take his time and look at the whole page and find page borders due to right inattention. Overall, moderate cognitive linguistic impaiment,as well as aphasia. Goals for       Assessment / Recommendations / Plan   Plan  Continue with current plan of care       Progression Toward Goals   Progression toward goals  Progressing toward goals         SLP Short Term Goals - 05/29/18 1044      SLP SHORT TERM GOAL #1   Title  pt will generate sentence responses in min-mod complex language tasks 80% accuracy with min-mod A occasionally for error awareness    Time  3    Period  Weeks ot 9 total sessions, for all STGs    Status  On-going      SLP SHORT TERM GOAL #2   Title  pt will read sentences of 8-9 words with error awareness at 90% over three sessions    Time  3    Period  Weeks    Status  On-going      SLP SHORT TERM GOAL #3   Title  pt will demo understanding of spoken sentence level stimuli with 80% success over three sesions    Time  3    Period  Weeks    Status  On-going      SLP SHORT TERM GOAL #4   Title  pt will complete cognitive linguistic testing within first 4 therapy sessions    Time  2    Period  Weeks or 5 total sessions    Status  Achieved      SLP SHORT TERM GOAL #5   Title  pt will complete a HEP for increasing accuracy of his verbal output with 90% success with rare min A for error awareness    Time  3    Period  Weeks    Status  On-going      Additional Short Term Goals   Additional Short Term Goals  Yes      SLP SHORT TERM GOAL #6   Title  Pt will selectively attend to structured cognitive lingustic tasks in mildly distracting environment with 85% on task and occasional min A for attention over 2 sessions    Time  4    Period  Weeks    Status  New       SLP Long Term Goals - 05/29/18 1046      SLP LONG TERM GOAL #1   Title  pt will complete a HEP for improving accuracy of his verbal output with 90% success over three sessions    Time  7    Period  Weeks or 17 sessions    Status  On-going      SLP LONG TERM GOAL #2   Title  pt will participate in 8 minutes of mod complex conversation appropriately, with modified independence    Time  7    Period  Weeks or 17 sessions    Status  On-going      SLP  LONG TERM GOAL #3   Title  pt will demo understanding of 8 minutes mod complex conversation over three  visits, with modified independence    Time  7    Period  Weeks or 17 visits    Status  On-going      SLP LONG TERM GOAL #4   Title  pt will demo understanding of 15 minutes mod complex/complex conversation over three visits, with modified independence    Time  12    Period  Weeks or 25 total visits    Status  On-going      SLP LONG TERM GOAL #5   Title  pt will demo awareness of verbal speech errors by correcting 95% of errors in 15 minutes mod complex/complex conversation over two sessions    Time  12    Period  Weeks or 25 visits    Status  On-going      Additional Long Term Goals   Additional Long Term Goals  Yes      SLP LONG TERM GOAL #6   Title  Pt will alternate attention between 2 simple cognitive linguistic tasks with 80% on each and occasional min A over 2 sessions    Time  12    Period  Weeks    Status  New      SLP LONG TERM GOAL #7   Title  Pt will utilize external aids for memory, schedule management, med management with occasional min A from family/caregivers over 2 sessions    Time  12    Period  Weeks    Status  New       Plan - 05/29/18 1040    Clinical Impression Statement  CLQT administered - see skilled intervention for results. I added goals for memory and attention. Moderate aphasia and moderate cognitive linguistic impairments persist affecting safety, communication and memory. Continue skilled ST to maximize cognition and communication for improved indepdence and to reduce caregiver burden.     Speech Therapy Frequency  2x / week    Duration  -- 12 weeks or total of 25 visits    Treatment/Interventions  Internal/external aids;Patient/family education;Compensatory strategies;SLP instruction and feedback;Functional tasks;Cognitive reorganization;Cueing hierarchy;Environmental controls;Language facilitation    Potential to Achieve Goals  Good     Potential Considerations  Ability to learn/carryover information       Patient will benefit from skilled therapeutic intervention in order to improve the following deficits and impairments:   Aphasia  Cognitive communication deficit    Problem List Patient Active Problem List   Diagnosis Date Noted  . DVT, lower extremity, distal, acute, right ([place]) 04/25/2018  . Acute lower UTI   . Atelectasis   . Hypoalbuminemia due to protein-calorie malnutrition ([place])   . Left temporal lobe hemorrhage ([facility]) 04/15/2018  . Vascular headache   . Seizure prophylaxis   . Cerebral edema (HCC)   . Atrial fibrillation with rapid ventricular response ([place])   . Pneumonia   . Diabetes mellitus type 2 in nonobese (HCC)   . Benign essential HTN   . History of pancreatic cancer   . FUO (fever of unknown origin)   . Dysphagia, post-stroke   . Tachycardia   . Tachypnea   . Hypothyroidism   . Hypernatremia   . Hypokalemia   . Acute blood loss anemia   . Thrombocytopenia ([place])   . Intracranial hemorrhage ([place]) 04/05/2018  . Acute pancreatitis 06/11/2014  . Headache 06/11/2014  . 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 Obstructive jaundice 11/11/2012  . Biliary stricture 11/11/2012  . Diabetes mellitus ([place]) 11/10/2012  .  HTN (hypertension) 11/10/2012  . Pancreatitis 11/10/2012  . Abdominal pain 11/10/2012    Lovvorn, Annye Rusk MS, CCC-SLP 05/29/2018, 10:49 AM  [facility] [address] [place], [place], [address] Phone: [phone]   Fax:  [phone]   Name: Hector Pugh MRN: [id number] Date of Birth: 1961-06-25

## 2018-05-29 NOTE — Therapy (Signed)
[facility] [address] [place], [place], [address] Phone: [phone]   Fax:  [phone]  Occupational Therapy Treatment  Patient Details  Name: Hector Pugh MRN: [id number] Date of Birth: 17-May-1961 Referring Provider: Dr. Delice Lesch   Encounter Date: 05/28/2018  OT End of Session - 05/28/18 0935    Visit Number  7    Number of Visits  17    Date for OT Re-Evaluation  06/30/18    Authorization Type  [employer]    OT Start Time  0935    OT Stop Time  1015    OT Time Calculation (min)  40 min    Activity Tolerance  Patient tolerated treatment well    Behavior During Therapy  [facility] for tasks assessed/performed       Past Medical History:  Diagnosis Date  . 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 Cancer ([place])   . Diabetes mellitus ([place]) 11/10/2012  . Diabetes mellitus without complication (HCC)    diet controlled  . HTN (hypertension) 11/10/2012  . Hypertension   . 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 Pancreatic cancer ([place])   . Pancreatitis 11/10/2012  . Seasonal allergies     Past Surgical History:  Procedure Laterality Date  . circucision    . circumsision    . COLONOSCOPY N/A 04/04/2018   Procedure: COLONOSCOPY;  Surgeon: Leighton Ruff, MD;  [facility];  Service: Endoscopy;  Laterality: N/A;  . CRANIOTOMY Left 04/05/2018   Procedure: Left Temporal CRANIOTOMY HEMATOMA EVACUATION of Intracranial Hemorrhage;  Surgeon: Ashok Pall, MD;  Location: [facility];  Service: Neurosurgery;  Laterality: Left;  . ERCP  11/08/2012   Procedure: ENDOSCOPIC RETROGRADE CHOLANGIOPANCREATOGRAPHY (ERCP);  Surgeon: Jeryl Columbia, MD;  Location[facility];  Service: Gastroenterology;  Laterality: N/A;  . EUS  11/08/2012   Procedure: UPPER ENDOSCOPIC ULTRASOUND (EUS) LINEAR;  Surgeon: Arta Silence, MD;  [facility];  Service: Endoscopy;  Laterality: N/A;  . FINE NEEDLE  ASPIRATION  11/08/2012   Procedure: FINE NEEDLE ASPIRATION (FNA) LINEAR;  Surgeon: Arta Silence, MD;  [facility];  Service: Endoscopy;  Laterality: N/A;  . HERNIA REPAIR     RIGHT  . LAPAROSCOPY  12/12/2012   Procedure: LAPAROSCOPY DIAGNOSTIC;  Surgeon: Stark Klein, MD;  [facility];  Service: General;  Laterality: N/A;  . Left Foot Surgery    . Left Foot Surgery    . VASECTOMY    . WHIPPLE PROCEDURE  12/12/2012   Procedure: WHIPPLE PROCEDURE;  Surgeon: Stark Klein, MD;  [facility];  Service: General;  Laterality: N/A;    There were no vitals filed for this visit.  Subjective Assessment - 05/28/18 0934    Subjective   denies pain    Patient Stated Goals  be independent    Currently in Pain?  No/denies               Treatment: Tabletop activity with I-pad to locate matching symbol, min difficulty/ v.c Scanning on vertical surface to locate a particular target, good accuracy, Environmental scanning with pt locating items of alternating attention in a busy environmental, mod difficulty, v.c Basic word search at tabletop min v.c/ difficulty for increased accuracy with scanning.               OT Short Term Goals - 05/28/18 0936      OT SHORT TERM GOAL #1   Title  Independent with  visual HEP   (Pended)     Status  Achieved  (Pended)  family assist pt      OT SHORT TERM GOAL #2   Title  Independent with putty HEP for Rt hand  (Pended)     Status  Achieved  (Pended)       OT SHORT TERM GOAL #3   Title  Pt/family to verbalize understanding with visual compensatory strategies for Rt visual field cut  (Pended)     Status  Achieved  (Pended)       OT SHORT TERM GOAL #4   Title  Pt to perform simple snack prep and light household chores with supervision only  (Pended)     Status  On-going  (Pended)       OT SHORT TERM GOAL #5   Title  Pt to perform environmental scanning with 75% accuracy or greater  (Pended)     Status  On-going  (Pended)          OT Long Term Goals - 04/30/18 1321      OT LONG TERM GOAL #1   Title  Pt to improve grip strength Rt dominant hand to 60 lbs or greater    Baseline  45 lbs    Time  8    Period  Weeks    Status  New    Target Date  06/30/18      OT LONG TERM GOAL #2   Title  Pt to perform environmental scanning while performing simple physical task at 90% accuracy    Time  8    Period  Weeks    Status  New      OT LONG TERM GOAL #3   Title  Pt to return to cooking tasks with direct supervision only    Time  8    Period  Weeks    Status  New      OT LONG TERM GOAL #4   Title  Pt to return to home management tasks with direct supervision only    Time  8    Period  Weeks    Status  New            Plan - 05/28/18 1012    Clinical Impression Statement  Pt continues to progress towards goals. Pt demonstrates improving tabletop and environmental scanning.    Rehab Potential  Good    Current Impairments/barriers affecting progress:  visual deficits, aphasia    OT Frequency  2x / week    OT Duration  8 weeks    OT Treatment/Interventions  Self-care/ADL training;DME and/or AE instruction;Therapeutic activities;Therapeutic exercise;Cognitive remediation/compensation;Coping strategies training;Neuromuscular education;Functional Mobility Training;Passive range of motion;Visual/perceptual remediation/compensation;Manual Therapy;Patient/family education    Plan  simple cooking task (grilled cheese),  (following session: making chai, pt's family to bring in ingredients)     Consulted and Agree with Plan of Care  Patient;Family member/caregiver    Family Member Consulted  daughter       Patient will benefit from skilled therapeutic intervention in order to improve the following deficits and impairments:  Decreased coordination, Improper body mechanics, Decreased safety awareness, Decreased endurance, Decreased activity tolerance, Decreased knowledge of precautions, Impaired UE functional  use, Decreased knowledge of use of DME, Decreased cognition, Decreased mobility, Decreased strength, Impaired perceived functional ability, Impaired vision/preception  Visit Diagnosis: Neurologic neglect syndrome  Attention and concentration deficit  Visuospatial deficit    Problem List Patient Active Problem List   Diagnosis Date Noted  .  DVT, lower extremity, distal, acute, right ([place]) 04/25/2018  . Acute lower UTI   . Atelectasis   . Hypoalbuminemia due to protein-calorie malnutrition ([facility])   . Left temporal lobe hemorrhage ([facility]) 04/15/2018  . Vascular headache   . Seizure prophylaxis   . Cerebral edema ([facility])   . Atrial fibrillation with rapid ventricular response ([place])   . Pneumonia   . Diabetes mellitus type 2 in nonobese (H[facility])   . Benign essential HTN   . History of pancreatic cancer   . FUO (fever of unknown origin)   . Dysphagia, post-stroke   . Tachycardia   . Tachypnea   . Hypothyroidism   . Hypernatremia   . Hypokalemia   . Acute blood loss anemia   . Thrombocytopenia ([place])   . Intracranial hemorrhage ([place]) 04/05/2018  . Acute pancreatitis 06/11/2014  . Headache 06/11/2014  . 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 Obstructive jaundice 11/11/2012  . Biliary stricture 11/11/2012  . Diabetes mellitus ([place]) 11/10/2012  . HTN (hypertension) 11/10/2012  . Pancreatitis 11/10/2012  . Abdominal pain 11/10/2012    RINE,KATHRYN 05/29/2018, 8:17 AM  [facility] [address] [place], [place], [address] Phone: [phone]   Fax:  [phone]  Name: Stewart Sasaki MRN: [id number] Date of Birth: 02-Mar-1961

## 2018-05-29 NOTE — Patient Instructions (Addendum)
Continue Eliquis (apixaban) daily for secondary stroke prevention  Continue to follow up with PCP regarding diabetes, choelsterol and blood pressure monitoring  Continue to follow up with heart doctor and eliquis management   Schedule appointment with eye doctor for visual field test  Continue to follow up with PT/OT/ST   You are not cleared to drive at this time as you will need additional clearance from OT and eye doctor   Continue to monitor blood pressure at home  Maintain strict control of hypertension with blood pressure goal below 130/90, diabetes with hemoglobin A1c goal below 6.5% and cholesterol with LDL cholesterol (bad cholesterol) goal below 70 mg/dL. I also advised the patient to eat a healthy diet with plenty of whole grains, cereals, fruits and vegetables, exercise regularly and maintain ideal body weight.  Followup in the future with me in 3 months or call earlier if needed          Thank you for coming to see [place] at [facility]. I hope we have been able to provide you high quality care today.  You may receive a patient satisfaction survey over the next few weeks. We would appreciate your feedback and comments so that we may continue to improve ourselves and the health of our patients.

## 2018-05-30 ENCOUNTER — Ambulatory Visit: Payer: Managed Care, Other (non HMO) | Admitting: Occupational Therapy

## 2018-05-30 ENCOUNTER — Ambulatory Visit: Payer: Self-pay | Admitting: Adult Health

## 2018-05-30 ENCOUNTER — Encounter: Payer: Self-pay | Admitting: Rehabilitation

## 2018-05-30 ENCOUNTER — Ambulatory Visit: Payer: Managed Care, Other (non HMO) | Admitting: Rehabilitation

## 2018-05-30 DIAGNOSIS — R41840 Attention and concentration deficit: Secondary | ICD-10-CM

## 2018-05-30 DIAGNOSIS — R414 Neurologic neglect syndrome: Secondary | ICD-10-CM

## 2018-05-30 DIAGNOSIS — R41842 Visuospatial deficit: Secondary | ICD-10-CM | POA: Diagnosis not present

## 2018-05-30 DIAGNOSIS — M6281 Muscle weakness (generalized): Secondary | ICD-10-CM

## 2018-05-30 DIAGNOSIS — R2689 Other abnormalities of gait and mobility: Secondary | ICD-10-CM

## 2018-05-30 DIAGNOSIS — R2681 Unsteadiness on feet: Secondary | ICD-10-CM

## 2018-05-30 NOTE — Therapy (Signed)
[facility] [address] [place], [place], [address] Phone: [phone]   Fax:  [phone]  Physical Therapy Treatment and D/C Summary   Patient Details  Name: Hector Pugh MRN: [id number] Date of Birth: 03-01-1961 Referring Provider: Dr. Delice Lesch   Encounter Date: 05/30/2018  PT End of Session - 05/30/18 0946    Visit Number  7    Number of Visits  9    Date for PT Re-Evaluation  06/02/18    Authorization Type  [employer]    PT Start Time  [phone] PT late from previous appt    PT Stop Time  1017    PT Time Calculation (min)  34 min    Equipment Utilized During Treatment  -- min guard to S prn    Activity Tolerance  Patient tolerated treatment well    Behavior During Therapy  [facility] for tasks assessed/performed       Past Medical History:  Diagnosis Date  . 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 Cancer ([place])   . Diabetes mellitus ([place]) 11/10/2012  . Diabetes mellitus without complication (HCC)    diet controlled  . HTN (hypertension) 11/10/2012  . Hypertension   . 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 Pancreatic cancer ([place])   . Pancreatitis 11/10/2012  . Seasonal allergies     Past Surgical History:  Procedure Laterality Date  . circucision    . circumsision    . COLONOSCOPY N/A 04/04/2018   Procedure: COLONOSCOPY;  Surgeon: Leighton Ruff, MD;  [facility];  Service: Endoscopy;  Laterality: N/A;  . CRANIOTOMY Left 04/05/2018   Procedure: Left Temporal CRANIOTOMY HEMATOMA EVACUATION of Intracranial Hemorrhage;  Surgeon: Ashok Pall, MD;  Location: [facility];  Service: Neurosurgery;  Laterality: Left;  . ERCP  11/08/2012   Procedure: ENDOSCOPIC RETROGRADE CHOLANGIOPANCREATOGRAPHY (ERCP);  Surgeon: Jeryl Columbia, MD;  Location: [facility];  Service: Gastroenterology;  Laterality: N/A;  . EUS  11/08/2012   Procedure: UPPER ENDOSCOPIC ULTRASOUND (EUS) LINEAR;  Surgeon:  Arta Silence, MD;  [facility];  Service: Endoscopy;  Laterality: N/A;  . FINE NEEDLE ASPIRATION  11/08/2012   Procedure: FINE NEEDLE ASPIRATION (FNA) LINEAR;  Surgeon: Arta Silence, MD;  [facility];  Service: Endoscopy;  Laterality: N/A;  . HERNIA REPAIR     RIGHT  . LAPAROSCOPY  12/12/2012   Procedure: LAPAROSCOPY DIAGNOSTIC;  Surgeon: Stark Klein, MD;  [facility];  Service: General;  Laterality: N/A;  . Left Foot Surgery    . Left Foot Surgery    . VASECTOMY    . WHIPPLE PROCEDURE  12/12/2012   Procedure: WHIPPLE PROCEDURE;  Surgeon: Stark Klein, MD;  [facility];  Service: General;  Laterality: N/A;    There were no vitals filed for this visit.  Subjective Assessment - 05/30/18 0945    Subjective  Pt reports he continues to be very active at home, getting old house ready to put on market.      Limitations  Walking    How long can you walk comfortably?  Has been able to ambulate in stores without issue.      Patient Stated Goals  "To move as best I can."     Currently in Pain?  No/denies                       [facility] Adult PT Treatment/Exercise - 05/30/18 8563  Ambulation/Gait   Ambulation/Gait  Yes    Ambulation/Gait Assistance  6: Modified independent (Device/Increase time)    Ambulation/Gait Assistance Details  Pt able to ambulate over varying outdoor surfaces at mod I level.  Pt did well scanning environment during task and maintain conversation.     Ambulation Distance (Feet)  1200 Feet    Assistive device  None    Gait Pattern  Within Functional Limits    Ambulation Surface  Unlevel;Outdoor;Paved;Gravel;Grass      High Level Balance   High Level Balance Activities  Tandem walking;Marching forwards;Marching backwards    High Level Balance Comments  Reviewed counter exercises from HEP, see pt instruction.  Also performed corner balance tasks with feet together EC on compliant surface maintaining balance x 2 sets of 20  secs progressing to feet together EC compliant surface with head motion up/down x 10 reps, and side to side x 10 reps.  Made one update to corner balance tasks, see pt instruction.        Self-Care   Self-Care  Other Self-Care Comments    Other Self-Care Comments   Provided education on slowly increasing pts independence outdoors.  They are not letting him go outside on his own currently.  PT educated that more familiar environments would be safe for him to go out and ambulate independently.  Educated that they could begin to observe him from indoors and then progress away from that, ensuing he keeps phone with him at all times in case of fall.  Continue to recommend at least distant S for more unfamiliar areas, but to watch to progress as they are cuing him less and less for scanning R environment.  Pt and daughter verbalized understanding.       Exercises   Exercises  Other Exercises    Other Exercises   Reviewed current HEP for LE strength with stand>half kneel>tall kneel and back to stand.  He is able to do this x 4 reps on each side at mod I level without external support.  Also performed standing squats without support (updated HEP) x 10 reps with good return of technique.              PT Education - 05/30/18 1005    Education Details  Educated on progress with PT, see self care for education on independence outdoors.     Person(s) Educated  Patient;Child(ren)    Methods  Explanation    Comprehension  Verbalized understanding       PT Short Term Goals - 05/03/18 1313      PT SHORT TERM GOAL #1   Title  =LTGs        PT Long Term Goals - 05/30/18 0949      PT LONG TERM GOAL #1   Title  Pt will be independent with final HEP in order to indicate decreased fall risk and improved functional mobility.  (Target date: 06/02/18)    Baseline  met 05/30/18    Time  4    Period  Weeks    Status  New      PT LONG TERM GOAL #2   Title  Pt will improve FGA to >/=28/30 in order to  indicate decreased fall risk.      Time  4    Period  Weeks    Status  Achieved      PT LONG TERM GOAL #3   Title  Pt will perform floor to stand tranfser without support and with  no report of dizziness/light headedness in order to return to preferred prayer position.      Time  4    Period  Weeks    Status  Achieved      PT LONG TERM GOAL #4   Title  Pt will ambulate >1000' over varying outdoor surfaces while engaging in dual task without overt LOB and with increased attention to R environment in order to return to community and leisure activity.     Time  4    Period  Weeks    Status  Achieved            Plan - 05/30/18 0947    Clinical Impression Statement  Skilled session addressing going over remaining LTG for HEP.  Made a few updates to decrease support to further challenge balance.  Also continue to assess outdoor gait in which pt did at independent to mod I level (increased time) and therefore educated daughter on allowing him more independence outdoors at home, however continuing S when out in unfamiliar areas.  Pt and daugther verbalized understanding and pt ready for D/C from PT.      Rehab Potential  Good    Clinical Impairments Affecting Rehab Potential  R inattention and R field cut    PT Frequency  2x / week    PT Duration  4 weeks    PT Treatment/Interventions  ADLs/Self Care Home Management;Gait training;Stair training;Functional mobility training;Therapeutic activities;Therapeutic exercise;Balance training;Neuromuscular re-education;Cognitive remediation;Patient/family education;Energy conservation    PT Next Visit Plan  --    Consulted and Agree with Plan of Care  Patient;Family member/caregiver    Family Member Consulted  daughter       Patient will benefit from skilled therapeutic intervention in order to improve the following deficits and impairments:  Decreased activity tolerance, Decreased balance, Decreased safety awareness, Decreased coordination,  Impaired vision/preception  Visit Diagnosis: Other abnormalities of gait and mobility  Unsteadiness on feet  Muscle weakness (generalized)  PHYSICAL THERAPY DISCHARGE SUMMARY  Visits from Start of Care: 7  Current functional level related to goals / functional outcomes: See LTGs above   Remaining deficits: Pt mostly limited by R inattention and visual field cut.    Education / Equipment: HEP for balance and strength  Plan: Patient agrees to discharge.  Patient goals were met. Patient is being discharged due to meeting the stated rehab goals.  ?????        Problem List Patient Active Problem List   Diagnosis Date Noted  . DVT, lower extremity, distal, acute, right ([place]) 04/25/2018  . Acute lower UTI   . Atelectasis   . Hypoalbuminemia due to protein-calorie malnutrition ([place])   . Left temporal lobe hemorrhage ([place]) 04/15/2018  . Vascular headache   . Seizure prophylaxis   . Cerebral edema (HCC)   . Atrial fibrillation with rapid ventricular response ([place])   . Pneumonia   . Diabetes mellitus type 2 in nonobese (HCC)   . Benign essential HTN   . History of pancreatic cancer   . FUO (fever of unknown origin)   . Dysphagia, post-stroke   . Tachycardia   . Tachypnea   . Hypothyroidism   . Hypernatremia   . Hypokalemia   . Acute blood loss anemia   . Thrombocytopenia ([place])   . Intracranial hemorrhage ([place]) 04/05/2018  . Acute pancreatitis 06/11/2014  . Headache 06/11/2014  . 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 Obstructive jaundice 11/11/2012  . Biliary  stricture 11/11/2012  . Diabetes mellitus ([place]) 11/10/2012  . HTN (hypertension) 11/10/2012  . Pancreatitis 11/10/2012  . Abdominal pain 11/10/2012    Cameron Sprang, PT, MPT [facility] [address] [place], [place], [address] Phone: [phone]   Fax:  [phone] 05/30/18, 12:11 PM  Name: Rambo Sarafian MRN: [id number] Date of Birth: 1961-09-26

## 2018-05-30 NOTE — Progress Notes (Signed)
I agree with the above plan 

## 2018-05-30 NOTE — Therapy (Signed)
[facility] [address] [place], [place], [address] Phone: [phone]   Fax:  [phone]  Occupational Therapy Treatment  Patient Details  Name: Hector Pugh MRN: [id number] Date of Birth: Jan 10, 1961 Referring Provider: Dr. Delice Lesch   Encounter Date: 05/30/2018  OT End of Session - 05/30/18 1155    Visit Number  8    Number of Visits  17    Date for OT Re-Evaluation  06/30/18    Authorization Type  [employer]igna    OT Start Time  0845    OT Stop Time  0935    OT Time Calculation (min)  50 min    Activity Tolerance  Patient tolerated treatment well    Behavior During Therapy  [facility] for tasks assessed/performed       Past Medical History:  Diagnosis Date  . 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 Cancer ([place])   . Diabetes mellitus ([place]) 11/10/2012  . Diabetes mellitus without complication (HCC)    diet controlled  . HTN (hypertension) 11/10/2012  . Hypertension   . 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 Pancreatic cancer ([place])   . Pancreatitis 11/10/2012  . Seasonal allergies     Past Surgical History:  Procedure Laterality Date  . circucision    . circumsision    . COLONOSCOPY N/A 04/04/2018   Procedure: COLONOSCOPY;  Surgeon: Leighton Ruff, MD;  [facility];  Service: Endoscopy;  Laterality: N/A;  . CRANIOTOMY Left 04/05/2018   Procedure: Left Temporal CRANIOTOMY HEMATOMA EVACUATION of Intracranial Hemorrhage;  Surgeon: Ashok Pall, MD;  Location: [facility];  Service: Neurosurgery;  Laterality: Left;  . ERCP  11/08/2012   Procedure: ENDOSCOPIC RETROGRADE CHOLANGIOPANCREATOGRAPHY (ERCP);  Surgeon: Jeryl Columbia, MD;  Location[facility];  Service: Gastroenterology;  Laterality: N/A;  . EUS  11/08/2012   Procedure: UPPER ENDOSCOPIC ULTRASOUND (EUS) LINEAR;  Surgeon: Arta Silence, MD;  [facility];  Service: Endoscopy;  Laterality: N/A;  . FINE NEEDLE  ASPIRATION  11/08/2012   Procedure: FINE NEEDLE ASPIRATION (FNA) LINEAR;  Surgeon: Arta Silence, MD;  [facility];  Service: Endoscopy;  Laterality: N/A;  . HERNIA REPAIR     RIGHT  . LAPAROSCOPY  12/12/2012   Procedure: LAPAROSCOPY DIAGNOSTIC;  Surgeon: Stark Klein, MD;  [facility];  Service: General;  Laterality: N/A;  . Left Foot Surgery    . Left Foot Surgery    . VASECTOMY    . WHIPPLE PROCEDURE  12/12/2012   Procedure: WHIPPLE PROCEDURE;  Surgeon: Stark Klein, MD;  [facility];  Service: General;  Laterality: N/A;    There were no vitals filed for this visit.  Subjective Assessment - 05/30/18 0852    Patient is accompained by:  Family member Daughter    Pertinent History  PMH: Ampullary Adenacarcinoma, DM2, HTN, tachycardia, A-fib    Limitations  no driving, 47/0 sup, no strenuous activity or lifting    Patient Stated Goals  be independent    Currently in Pain?  No/denies                   OT Treatments/Exercises (OP) - 05/30/18 0001      Visual/Perceptual Exercises   Copy this Image  PVC    PVC  PVC pipe tree design (Fig 14) with initial mod cues for set up and organizing pieces, and for scanning far Rt and Lt to see all pieces. Once set up,  however, pt able to complete task w/ only intermittent min cues (relatively Independent).     Scanning  Tabletop    Scanning - Tabletop  24 pc puzzle - pt with mod to max difficulty, and min to mod cues required for visual attention, developing strategies/problem solving, slowing down, orienting pc's, and to use Rt hand.                OT Short Term Goals - 05/30/18 1201      OT SHORT TERM GOAL #1   Title  Independent with visual HEP     Status  Achieved family assist pt      OT SHORT TERM GOAL #2   Title  Independent with putty HEP for Rt hand    Status  Achieved      OT SHORT TERM GOAL #3   Title  Pt/family to verbalize understanding with visual compensatory strategies for Rt visual  field cut    Status  Achieved      OT SHORT TERM GOAL #4   Title  Pt to perform simple snack prep and light household chores with supervision only    Status  On-going      OT SHORT TERM GOAL #5   Title  Pt to perform environmental scanning with 75% accuracy or greater    Status  On-going        OT Long Term Goals - 04/30/18 1321      OT LONG TERM GOAL #1   Title  Pt to improve grip strength Rt dominant hand to 60 lbs or greater    Baseline  45 lbs    Time  8    Period  Weeks    Status  New    Target Date  06/30/18      OT LONG TERM GOAL #2   Title  Pt to perform environmental scanning while performing simple physical task at 90% accuracy    Time  8    Period  Weeks    Status  New      OT LONG TERM GOAL #3   Title  Pt to return to cooking tasks with direct supervision only    Time  8    Period  Weeks    Status  New      OT LONG TERM GOAL #4   Title  Pt to return to home management tasks with direct supervision only    Time  8    Period  Weeks    Status  New            Plan - 05/30/18 1156    Clinical Impression Statement  Pt demo decreased visual attention, slight impulsivity, reduced scanning to far Rt side, decr. automatic use of Rt hand, and decr. visual/perceptual problem solving during tasks today. Pt continues to make gradual progress however with wider scanning    Occupational Profile and client history currently impacting functional performance  PMH: Ampullary adenacarcinoma, DM type 2, HTN, tachycardia, A-fib. 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[employer]    Occupational performance deficits (Please refer to evaluation for details):  ADL's;IADL's;Work;Leisure;Social Participation    Rehab Potential  Good    Current Impairments/barriers affecting progress:  visual deficits, aphasia    OT Frequency  2x / week    OT Duration  8 weeks    OT Treatment/Interventions  Self-care/ADL training;DME and/or AE instruction;Therapeutic  activities;Therapeutic exercise;Cognitive remediation/compensation;Coping strategies training;Neuromuscular education;Functional Mobility  Training;Passive range of motion;Visual/perceptual remediation/compensation;Manual Therapy;Patient/family education    Plan  simple cooking task (making chai or grilled cheese), following session: continue environmental scanning, visual attention and visual memory on ipad    Consulted and Agree with Plan of Care  Patient;Family member/caregiver    Family Member Consulted  daughter       Patient will benefit from skilled therapeutic intervention in order to improve the following deficits and impairments:  Decreased coordination, Improper body mechanics, Decreased safety awareness, Decreased endurance, Decreased activity tolerance, Decreased knowledge of precautions, Impaired UE functional use, Decreased knowledge of use of DME, Decreased cognition, Decreased mobility, Decreased strength, Impaired perceived functional ability, Impaired vision/preception  Visit Diagnosis: Neurologic neglect syndrome  Attention and concentration deficit  Visuospatial deficit    Problem List Patient Active Problem List   Diagnosis Date Noted  . DVT, lower extremity, distal, acute, right ([place]) 04/25/2018  . Acute lower UTI   . Atelectasis   . Hypoalbuminemia due to protein-calorie malnutrition ([place])   . Left temporal lobe hemorrhage ([place]) 04/15/2018  . Vascular headache   . Seizure prophylaxis   . Cerebral edema ([facility])   . Atrial fibrillation with rapid ventricular response ([place])   . Pneumonia   . Diabetes mellitus type 2 in nonobese ([facility])   . Benign essential HTN   . History of pancreatic cancer   . FUO (fever of unknown origin)   . Dysphagia, post-stroke   . Tachycardia   . Tachypnea   . Hypothyroidism   . Hypernatremia   . Hypokalemia   . Acute blood loss anemia   . Thrombocytopenia ([place])   . Intracranial hemorrhage ([place]) 04/05/2018  . Acute pancreatitis  06/11/2014  . Headache 06/11/2014  . 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 Obstructive jaundice 11/11/2012  . Biliary stricture 11/11/2012  . Diabetes mellitus ([place]) 11/10/2012  . HTN (hypertension) 11/10/2012  . Pancreatitis 11/10/2012  . Abdominal pain 11/10/2012    Carey Bullocks, OTR/L 05/30/2018, 12:01 PM  [facility] [address] [place], [place], [address] Phone: [phone]   Fax:  [phone]  Name: Selig Wampole MRN: [id number] Date of Birth: [birth year]

## 2018-05-30 NOTE — Patient Instructions (Addendum)
   Start in standing position (if you are alone, do beside a couch for support if you need it).  Go into half kneeling position as below:      Then bring legs side by side (so that you are on both knees) and then return to this position (above) and back to stand.  Do each side x 5 reps.     Squat: Double Leg (Supported)    Do without support.  Squat, keeping lower leg vertical, knee in line with second toe. Use legs, do not pull up and down with arms.  Place chair behind you and think about aiming your bottom for the chair.  Keep feet pointed forward and knees going straight ahead not out to the side.  Repeat _10__ times per set. Rest _30__ seconds after set. Do _2 sets per session.  http://plyo.exer.us/74   Copyright  VHI. All rights reserved.    Other things to do at home:  Work on walking (in neighborhood or in yard for more challenge) while tossing ball (bean bag, etc anything small that he can toss) and try and add another cognitive task like naming foods, animals, etc (something that he is familiar with).  Can also have him kick ball in yard (switching feet intermittently) again while maintaining conversation or doing another cognitive task.     Stand in corner with chair in front of you for support.    Feet Together (Compliant Surface) Head Motion - Eyes Closed    Stand on compliant surface: ___pillow_____ with feet together. Close eyes and move head slowly, up and down x 10 reps, side to side x 10 reps. Repeat __1__ times per session. Do __2__ sessions per day.  Copyright  VHI. All rights reserved.     Feet Together (Compliant Surface) Varied Arm Positions - Eyes Closed    Stand on compliant surface: ___pillow/cushion_____ with feet together and arms by your side. Close eyes and visualize upright position. Hold__20__ seconds. Repeat __3__ times per session. Do __1-2__ sessions per day.  Copyright  VHI. All rights reserved.     FUNCTIONAL  MOBILITY: Marching - Standing    March forward along countertop (for support if you need it) by lifting left leg up, then right. Alternate. Do x 8-10 feet and repeat x 3 reps.  Just try and do without support and move slowly!!   Copyright  VHI. All rights reserved.   Tandem Walking    Do beside counter top for support if needed. Walk with each foot directly in front of other, heel of one foot touching toes of other foot with each step. Both feet straight ahead. Do 8-10 feet x 3 reps. Just try and do without support and slowly!!   Copyright  VHI. All rights reserved.

## 2018-05-31 ENCOUNTER — Ambulatory Visit: Payer: 59 | Admitting: Oncology

## 2018-05-31 ENCOUNTER — Encounter

## 2018-05-31 ENCOUNTER — Other Ambulatory Visit: Payer: 59

## 2018-06-03 ENCOUNTER — Ambulatory Visit: Payer: Managed Care, Other (non HMO) | Attending: Internal Medicine | Admitting: Occupational Therapy

## 2018-06-03 ENCOUNTER — Encounter

## 2018-06-03 ENCOUNTER — Ambulatory Visit: Payer: Managed Care, Other (non HMO) | Admitting: Rehabilitation

## 2018-06-03 DIAGNOSIS — R41842 Visuospatial deficit: Secondary | ICD-10-CM | POA: Diagnosis present

## 2018-06-03 DIAGNOSIS — R41840 Attention and concentration deficit: Secondary | ICD-10-CM

## 2018-06-03 DIAGNOSIS — M6281 Muscle weakness (generalized): Secondary | ICD-10-CM | POA: Insufficient documentation

## 2018-06-03 DIAGNOSIS — R4701 Aphasia: Secondary | ICD-10-CM | POA: Insufficient documentation

## 2018-06-03 DIAGNOSIS — R278 Other lack of coordination: Secondary | ICD-10-CM

## 2018-06-03 DIAGNOSIS — R41841 Cognitive communication deficit: Secondary | ICD-10-CM | POA: Insufficient documentation

## 2018-06-03 DIAGNOSIS — I69118 Other symptoms and signs involving cognitive functions following nontraumatic intracerebral hemorrhage: Secondary | ICD-10-CM | POA: Insufficient documentation

## 2018-06-03 DIAGNOSIS — R414 Neurologic neglect syndrome: Secondary | ICD-10-CM

## 2018-06-03 DIAGNOSIS — R2689 Other abnormalities of gait and mobility: Secondary | ICD-10-CM | POA: Diagnosis present

## 2018-06-03 NOTE — Therapy (Signed)
[facility] [address] [place], [place], [address] Phone: [phone]   Fax:  [phone]  Occupational Therapy Treatment  Patient Details  Name: Hector Pugh MRN: [id number] Date of Birth: Jul 18, 1961 Referring Provider: Dr. Delice Lesch   Encounter Date: 06/03/2018  OT End of Session - 06/03/18 1455    Visit Number  9    Number of Visits  17    Date for OT Re-Evaluation  06/30/18    Authorization Type  [employer]    OT Start Time  1448    OT Stop Time  1530    OT Time Calculation (min)  42 min    Activity Tolerance  Patient tolerated treatment well    Behavior During Therapy  [facility] for tasks assessed/performed       Past Medical History:  Diagnosis Date  . 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 Cancer ([place])   . Diabetes mellitus ([place]) 11/10/2012  . Diabetes mellitus without complication (HCC)    diet controlled  . HTN (hypertension) 11/10/2012  . Hypertension   . 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 Pancreatic cancer ([place])   . Pancreatitis 11/10/2012  . Seasonal allergies     Past Surgical History:  Procedure Laterality Date  . circucision    . circumsision    . COLONOSCOPY N/A 04/04/2018   Procedure: COLONOSCOPY;  Surgeon: Leighton Ruff, MD;  [facility];  Service: Endoscopy;  Laterality: N/A;  . CRANIOTOMY Left 04/05/2018   Procedure: Left Temporal CRANIOTOMY HEMATOMA EVACUATION of Intracranial Hemorrhage;  Surgeon: Ashok Pall, MD;  Location: [facility];  Service: Neurosurgery;  Laterality: Left;  . ERCP  11/08/2012   Procedure: ENDOSCOPIC RETROGRADE CHOLANGIOPANCREATOGRAPHY (ERCP);  Surgeon: Jeryl Columbia, MD;  Location[facility];  Service: Gastroenterology;  Laterality: N/A;  . EUS  11/08/2012   Procedure: UPPER ENDOSCOPIC ULTRASOUND (EUS) LINEAR;  Surgeon: Arta Silence, MD;  [facility];  Service: Endoscopy;  Laterality: N/A;  . FINE NEEDLE  ASPIRATION  11/08/2012   Procedure: FINE NEEDLE ASPIRATION (FNA) LINEAR;  Surgeon: Arta Silence, MD;  [facility];  Service: Endoscopy;  Laterality: N/A;  . HERNIA REPAIR     RIGHT  . LAPAROSCOPY  12/12/2012   Procedure: LAPAROSCOPY DIAGNOSTIC;  Surgeon: Stark Klein, MD;  [facility];  Service: General;  Laterality: N/A;  . Left Foot Surgery    . Left Foot Surgery    . VASECTOMY    . WHIPPLE PROCEDURE  12/12/2012   Procedure: WHIPPLE PROCEDURE;  Surgeon: Stark Klein, MD;  [facility];  Service: General;  Laterality: N/A;    There were no vitals filed for this visit.  Subjective Assessment - 06/03/18 1451    Limitations  no driving, 40/0 sup, no strenuous activity or lifting    Patient Stated Goals  be independent    Currently in Pain?  No/denies                   Treatment: Basic environmental scanning with 84% accuracy on first pass, pt located remaining items on second pass. Visual scanning task for locating a particular symbol then alternating symbol matching, min difficulty/ v.c Copying small peg design with RUE for improved RUE coordination/ awareness, and visual perceptual skills, min v.c initially then pt completed design correctly, increased time required Arm bike x 6 mins level 4 for conditioning          OT Short Term  Goals - 06/03/18 1457      OT SHORT TERM GOAL #1   Title  Independent with visual HEP     Status  Achieved      OT SHORT TERM GOAL #2   Title  Independent with putty HEP for Rt hand    Status  Achieved      OT SHORT TERM GOAL #3   Title  Pt/family to verbalize understanding with visual compensatory strategies for Rt visual field cut    Status  Achieved      OT SHORT TERM GOAL #4   Title  Pt to perform simple snack prep and light household chores with supervision only    Status  On-going      OT SHORT TERM GOAL #5   Title  Pt to perform environmental scanning with 75% accuracy or greater    Status  Achieved  84% for basic        OT Long Term Goals - 04/30/18 1321      OT LONG TERM GOAL #1   Title  Pt to improve grip strength Rt dominant hand to 60 lbs or greater    Baseline  45 lbs    Time  8    Period  Weeks    Status  New    Target Date  06/30/18      OT LONG TERM GOAL #2   Title  Pt to perform environmental scanning while performing simple physical task at 90% accuracy    Time  8    Period  Weeks    Status  New      OT LONG TERM GOAL #3   Title  Pt to return to cooking tasks with direct supervision only    Time  8    Period  Weeks    Status  New      OT LONG TERM GOAL #4   Title  Pt to return to home management tasks with direct supervision only    Time  8    Period  Weeks    Status  New            Plan - 06/03/18 1510    Clinical Impression Statement  Pt is progressing towards goals. He demonstrates improved environmental scanning.    Rehab Potential  Good    OT Frequency  2x / week    OT Duration  8 weeks    OT Treatment/Interventions  Self-care/ADL training;DME and/or AE instruction;Therapeutic activities;Therapeutic exercise;Cognitive remediation/compensation;Coping strategies training;Neuromuscular education;Functional Mobility Training;Passive range of motion;Visual/perceptual remediation/compensation;Manual Therapy;Patient/family education    Plan  simple cooking task (making chai or grilled cheese),    Consulted and Agree with Plan of Care  Patient       Patient will benefit from skilled therapeutic intervention in order to improve the following deficits and impairments:  Decreased coordination, Improper body mechanics, Decreased safety awareness, Decreased endurance, Decreased activity tolerance, Decreased knowledge of precautions, Impaired UE functional use, Decreased knowledge of use of DME, Decreased cognition, Decreased mobility, Decreased strength, Impaired perceived functional ability, Impaired vision/preception  Visit Diagnosis: Neurologic neglect  syndrome  Attention and concentration deficit  Visuospatial deficit  Other abnormalities of gait and mobility  Other lack of coordination    Problem List Patient Active Problem List   Diagnosis Date Noted  . DVT, lower extremity, distal, acute, right ([place]) 04/25/2018  . Acute lower UTI   . Atelectasis   . Hypoalbuminemia due to protein-calorie malnutrition ([place])   . Left  temporal lobe hemorrhage ([place]) 04/15/2018  . Vascular headache   . Seizure prophylaxis   . Cerebral edema (H[facility])   . Atrial fibrillation with rapid ventricular response ([place])   . Pneumonia   . Diabetes mellitus type 2 in nonobese (HCC)   . Benign essential HTN   . History of pancreatic cancer   . FUO (fever of unknown origin)   . Dysphagia, post-stroke   . Tachycardia   . Tachypnea   . Hypothyroidism   . Hypernatremia   . Hypokalemia   . Acute blood loss anemia   . Thrombocytopenia ([place])   . Intracranial hemorrhage ([place]) 04/05/2018  . Acute pancreatitis 06/11/2014  . Headache 06/11/2014  . 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 Obstructive jaundice 11/11/2012  . Biliary stricture 11/11/2012  . Diabetes mellitus ([place]) 11/10/2012  . HTN (hypertension) 11/10/2012  . Pancreatitis 11/10/2012  . Abdominal pain 11/10/2012    Jaena Brocato 06/03/2018, 3:15 PM  [facility] [address] [place], [place], [address] Phone: [phone]   Fax:  [phone]  Name: Cliford Sequeira MRN: [id number] Date of Birth: 19-Nov-1961

## 2018-06-04 ENCOUNTER — Ambulatory Visit: Payer: Managed Care, Other (non HMO)

## 2018-06-04 ENCOUNTER — Ambulatory Visit: Payer: Managed Care, Other (non HMO) | Admitting: Occupational Therapy

## 2018-06-04 DIAGNOSIS — R414 Neurologic neglect syndrome: Secondary | ICD-10-CM

## 2018-06-04 DIAGNOSIS — R41842 Visuospatial deficit: Secondary | ICD-10-CM

## 2018-06-04 DIAGNOSIS — R4701 Aphasia: Secondary | ICD-10-CM

## 2018-06-04 DIAGNOSIS — R41841 Cognitive communication deficit: Secondary | ICD-10-CM

## 2018-06-04 DIAGNOSIS — R41840 Attention and concentration deficit: Secondary | ICD-10-CM

## 2018-06-04 NOTE — Therapy (Signed)
[facility] [address] [place], [place], [address] Phone: [phone]   Fax:  [phone]  Speech Language Pathology Treatment  Patient Details  Name: Hector Pugh MRN: [id number] Date of Birth: 1961-06-19 Referring Provider: Delice Lesch, MD   Encounter Date: 06/04/2018  End of Session - 06/04/18 1143    Visit Number  5    Number of Visits  17    Date for SLP Re-Evaluation  08/01/18    Authorization Type  pre-auth - [employer]    Authorization Time Period  05-10-18 to 11-09-18    Authorization - Visit Number  5    Authorization - Number of Visits  25    SLP Start Time  9735    SLP Stop Time   3299    SLP Time Calculation (min)  42 min       Past Medical History:  Diagnosis Date  . 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 Cancer ([place])   . Diabetes mellitus ([place]) 11/10/2012  . Diabetes mellitus without complication (HCC)    diet controlled  . HTN (hypertension) 11/10/2012  . Hypertension   . 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 Pancreatic cancer ([place])   . Pancreatitis 11/10/2012  . Seasonal allergies     Past Surgical History:  Procedure Laterality Date  . circucision    . circumsision    . COLONOSCOPY N/A 04/04/2018   Procedure: COLONOSCOPY;  Surgeon: Leighton Ruff, MD;  [facility];  Service: Endoscopy;  Laterality: N/A;  . CRANIOTOMY Left 04/05/2018   Procedure: Left Temporal CRANIOTOMY HEMATOMA EVACUATION of Intracranial Hemorrhage;  Surgeon: Ashok Pall, MD;  Location: [facility];  Service: Neurosurgery;  Laterality: Left;  . ERCP  11/08/2012   Procedure: ENDOSCOPIC RETROGRADE CHOLANGIOPANCREATOGRAPHY (ERCP);  Surgeon: Jeryl Columbia, MD;  [facility];  Service: Gastroenterology;  Laterality: N/A;  . EUS  11/08/2012   Procedure: UPPER ENDOSCOPIC ULTRASOUND (EUS) LINEAR;  Surgeon: Arta Silence, MD;  [facility];  Service: Endoscopy;  Laterality:  N/A;  . FINE NEEDLE ASPIRATION  11/08/2012   Procedure: FINE NEEDLE ASPIRATION (FNA) LINEAR;  Surgeon: Arta Silence, MD;  [facility];  Service: Endoscopy;  Laterality: N/A;  . HERNIA REPAIR     RIGHT  . LAPAROSCOPY  12/12/2012   Procedure: LAPAROSCOPY DIAGNOSTIC;  Surgeon: Stark Klein, MD;  [facility];  Service: General;  Laterality: N/A;  . Left Foot Surgery    . Left Foot Surgery    . VASECTOMY    . WHIPPLE PROCEDURE  12/12/2012   Procedure: WHIPPLE PROCEDURE;  Surgeon: Stark Klein, MD;  [facility];  Service: General;  Laterality: N/A;    There were no vitals filed for this visit.  Subjective Assessment - 06/04/18 1104    Subjective  "I'm getting there."    Currently in Pain?  No/denies            ADULT SLP TREATMENT - 06/04/18 1104      General Information   Behavior/Cognition  Alert;Pleasant mood;Cooperative;Impulsive      Treatment Provided   Treatment provided  Cognitive-Linquistic      Cognitive-Linquistic Treatment   Treatment focused on  Aphasia    Skilled Treatment  SLP worked with pt today on receptive language of 2-3 sentence stimuli of unfamiliar information [place], origin of maps, Gillian Scarce). Pt usually req'd repeat of spoken stimuli, breaking down of 2-sentence stimuli into single sentence stimuli, and  rephrasing/repetition of single-sentence level information. This mod-max A was needed occassionally for 80% pt success.  To compensate for pt's memory deficits, SLP occasionally (approx 50% of the time) used written cues on whiteboard.        Assessment / Recommendations / Plan   Plan  Continue with current plan of care      Progression Toward Goals   Progression toward goals  Progressing toward goals         SLP Short Term Goals - 06/04/18 2241      SLP SHORT TERM GOAL #1   Title  pt will generate sentence responses in min-mod complex language tasks 80% accuracy with min-mod A occasionally for error awareness    Time  2     Period  Weeks ot 9 total sessions, for all STGs    Status  On-going      SLP SHORT TERM GOAL #2   Title  pt will read sentences of 8-9 words with error awareness at 90% over three sessions    Time  2    Period  Weeks    Status  On-going      SLP SHORT TERM GOAL #3   Title  pt will demo understanding of spoken sentence level stimuli with 80% success and occasional min A over two sessions    Time  2    Period  Weeks    Status  Revised      SLP SHORT TERM GOAL #4   Title  pt will complete cognitive linguistic testing within first 4 therapy sessions    Time  --    Period  -- or 5 total sessions    Status  Achieved      SLP SHORT TERM GOAL #5   Title  pt will complete a HEP for increasing accuracy of his verbal output with 90% success with rare min A for error awareness    Time  2    Period  Weeks    Status  On-going      SLP SHORT TERM GOAL #6   Title  Pt will selectively attend to structured cognitive lingustic tasks in mildly distracting environment with 85% on task and occasional min A for attention over 2 sessions    Time  4    Period  Weeks    Status  On-going       SLP Long Term Goals - 06/04/18 2248      SLP LONG TERM GOAL #1   Title  pt will complete a HEP for improving accuracy of his verbal output with 90% success over three sessions    Time  6    Period  Weeks or 17 sessions    Status  On-going      SLP LONG TERM GOAL #2   Title  pt will participate in 8 minutes of mod complex conversation appropriately, with modified independence    Time  6    Period  Weeks or 17 sessions    Status  On-going      SLP LONG TERM GOAL #3   Title  pt will demo understanding of 8 minutes mod complex conversation over three visits, with modified independence    Time  7    Period  Weeks or 17 visits    Status  On-going      SLP LONG TERM GOAL #4   Title  pt will demo understanding of 15 minutes mod complex/complex conversation over three visits, with modified independence  Time  11    Period  Weeks or 25 total visits    Status  On-going      SLP LONG TERM GOAL #5   Title  pt will demo awareness of verbal speech errors by correcting 95% of errors in 15 minutes mod complex/complex conversation over two sessions    Time  11    Period  Weeks or 25 visits    Status  On-going      SLP LONG TERM GOAL #6   Title  Pt will alternate attention between 2 simple cognitive linguistic tasks with 80% on each and occasional min A over 2 sessions    Time  111    Period  Weeks    Status  New      SLP LONG TERM GOAL #7   Title  Pt will utilize external aids for memory, schedule management, med management with occasional min A from family/caregivers over 2 sessions    Time  11    Period  Weeks    Status  New       Plan - 06/04/18 2245    Clinical Impression Statement  Hector Pugh cont to present today with mod aphasia (mod expressive, mod receptive). Pt also continues to exhibit cognitive-linguistic deficits. He would cont to benefit from skilled ST targeting langauge skills (recpetive and expressive), and from a cognitive linguistic evaluation and probable goals generated, as OT is not directly addressing this in their plan of care.     Speech Therapy Frequency  2x / week    Treatment/Interventions  Internal/external aids;Patient/family education;Compensatory strategies;SLP instruction and feedback;Functional tasks;Cognitive reorganization;Cueing hierarchy;Environmental controls;Language facilitation    Potential to Achieve Goals  Good    Consulted and Agree with Plan of Care  Patient;Family member/caregiver    Family Member Consulted  daughter       Patient will benefit from skilled therapeutic intervention in order to improve the following deficits and impairments:   Aphasia  Cognitive communication deficit    Problem List Patient Active Problem List   Diagnosis Date Noted  . DVT, lower extremity, distal, acute, right ([place]) 04/25/2018  . Acute lower UTI    . Atelectasis   . Hypoalbuminemia due to protein-calorie malnutrition ([place])   . Left temporal lobe hemorrhage ([place]) 04/15/2018  . Vascular headache   . Seizure prophylaxis   . Cerebral edema ([facility])   . Atrial fibrillation with rapid ventricular response ([place])   . Pneumonia   . Diabetes mellitus type 2 in nonobese (HCC)   . Benign essential HTN   . History of pancreatic cancer   . FUO (fever of unknown origin)   . Dysphagia, post-stroke   . Tachycardia   . Tachypnea   . Hypothyroidism   . Hypernatremia   . Hypokalemia   . Acute blood loss anemia   . Thrombocytopenia ([place])   . Intracranial hemorrhage ([place]) 04/05/2018  . Acute pancreatitis 06/11/2014  . Headache 06/11/2014  . 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 Obstructive jaundice 11/11/2012  . Biliary stricture 11/11/2012  . Diabetes mellitus ([place]) 11/10/2012  . HTN (hypertension) 11/10/2012  . Pancreatitis 11/10/2012  . Abdominal pain 11/10/2012    [facility] ,[place], [place]  06/04/2018, 10:49 PM  [facility] [address] [place], [place], [address] Phone: [phone]   Fax:  [phone]   Name: Hector Pugh MRN: [id number] Date of Birth: 1961-06-11

## 2018-06-04 NOTE — Patient Instructions (Signed)
  Please complete the assigned speech therapy homework prior to your next session and return it to the speech therapist at your next visit.  

## 2018-06-04 NOTE — Therapy (Signed)
[facility] [address] [place], [place], [address] Phone: [phone]   Fax:  [phone]  Occupational Therapy Treatment  Patient Details  Name: Hector Pugh MRN: [id number] Date of Birth: 12/10/1960 Referring Provider: Dr. Delice Lesch   Encounter Date: 06/04/2018  OT End of Session - 06/04/18 1308    Visit Number  10    Number of Visits  17    Date for OT Re-Evaluation  06/30/18    Authorization Type  [employer]    OT Start Time  1145    OT Stop Time  1230    OT Time Calculation (min)  45 min    Activity Tolerance  Patient tolerated treatment well    Behavior During Therapy  [facility] for tasks assessed/performed       Past Medical History:  Diagnosis Date  . 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 Cancer ([place])   . Diabetes mellitus ([place]) 11/10/2012  . Diabetes mellitus without complication (HCC)    diet controlled  . HTN (hypertension) 11/10/2012  . Hypertension   . 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 Pancreatic cancer ([place])   . Pancreatitis 11/10/2012  . Seasonal allergies     Past Surgical History:  Procedure Laterality Date  . circucision    . circumsision    . COLONOSCOPY N/A 04/04/2018   Procedure: COLONOSCOPY;  Surgeon: Leighton Ruff, MD;  [facility];  Service: Endoscopy;  Laterality: N/A;  . CRANIOTOMY Left 04/05/2018   Procedure: Left Temporal CRANIOTOMY HEMATOMA EVACUATION of Intracranial Hemorrhage;  Surgeon: Ashok Pall, MD;  Location: [facility];  Service: Neurosurgery;  Laterality: Left;  . ERCP  11/08/2012   Procedure: ENDOSCOPIC RETROGRADE CHOLANGIOPANCREATOGRAPHY (ERCP);  Surgeon: Jeryl Columbia, MD;  [facility];  Service: Gastroenterology;  Laterality: N/A;  . EUS  11/08/2012   Procedure: UPPER ENDOSCOPIC ULTRASOUND (EUS) LINEAR;  Surgeon: Arta Silence, MD;  [facility];  Service: Endoscopy;  Laterality: N/A;  . FINE NEEDLE  ASPIRATION  11/08/2012   Procedure: FINE NEEDLE ASPIRATION (FNA) LINEAR;  Surgeon: Arta Silence, MD;  [facility];  Service: Endoscopy;  Laterality: N/A;  . HERNIA REPAIR     RIGHT  . LAPAROSCOPY  12/12/2012   Procedure: LAPAROSCOPY DIAGNOSTIC;  Surgeon: Stark Klein, MD;  [facility];  Service: General;  Laterality: N/A;  . Left Foot Surgery    . Left Foot Surgery    . VASECTOMY    . WHIPPLE PROCEDURE  12/12/2012   Procedure: WHIPPLE PROCEDURE;  Surgeon: Stark Klein, MD;  [facility];  Service: General;  Laterality: N/A;    There were no vitals filed for this visit.  Subjective Assessment - 06/04/18 1152    Subjective   I forget to turn my head more to the Rt side. I did not bring in the ingredients to make the tea today    Pertinent History  PMH: Ampullary Adenacarcinoma, DM2, HTN, tachycardia, A-fib    Limitations  no driving, 93/5 sup, no strenuous activity or lifting    Patient Stated Goals  be independent    Currently in Pain?  No/denies                   OT Treatments/Exercises (OP) - 06/04/18 0001      Visual/Perceptual Exercises   Scanning  Environmental    Scanning - Environmental  Scanning to look for items in environment - pt able to see  things in center visual field but would often miss things to far Rt side and even Lt side.     Other Exercises  Using ipad to work on visual memory, visual attention, and visual scanning (simple and more complex with alternating symbols) through "symbol matching", "alternating symbol matching", "pattern recreation", and "picture matching"                OT Short Term Goals - 06/03/18 1457      OT SHORT TERM GOAL #1   Title  Independent with visual HEP     Status  Achieved      OT SHORT TERM GOAL #2   Title  Independent with putty HEP for Rt hand    Status  Achieved      OT SHORT TERM GOAL #3   Title  Pt/family to verbalize understanding with visual compensatory strategies for Rt visual  field cut    Status  Achieved      OT SHORT TERM GOAL #4   Title  Pt to perform simple snack prep and light household chores with supervision only    Status  On-going      OT SHORT TERM GOAL #5   Title  Pt to perform environmental scanning with 75% accuracy or greater    Status  Achieved 84% for basic        OT Long Term Goals - 04/30/18 1321      OT LONG TERM GOAL #1   Title  Pt to improve grip strength Rt dominant hand to 60 lbs or greater    Baseline  45 lbs    Time  8    Period  Weeks    Status  New    Target Date  06/30/18      OT LONG TERM GOAL #2   Title  Pt to perform environmental scanning while performing simple physical task at 90% accuracy    Time  8    Period  Weeks    Status  New      OT LONG TERM GOAL #3   Title  Pt to return to cooking tasks with direct supervision only    Time  8    Period  Weeks    Status  New      OT LONG TERM GOAL #4   Title  Pt to return to home management tasks with direct supervision only    Time  8    Period  Weeks    Status  New            Plan - 06/04/18 1309    Clinical Impression Statement  Pt is progressing towards goals. He demonstrates improved environmental scanning.    Occupational Profile and client history currently impacting functional performance  PMH: Ampullary adenacarcinoma, DM type 2, HTN, tachycardia, A-fib. 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[employer]    Occupational performance deficits (Please refer to evaluation for details):  ADL's;IADL's;Work;Leisure;Social Participation    Rehab Potential  Good    OT Frequency  2x / week    OT Duration  8 weeks    OT Treatment/Interventions  Self-care/ADL training;DME and/or AE instruction;Therapeutic activities;Therapeutic exercise;Cognitive remediation/compensation;Coping strategies training;Neuromuscular education;Functional Mobility Training;Passive range of motion;Visual/perceptual remediation/compensation;Manual  Therapy;Patient/family education    Plan  simple cooking task (making chai or grilled cheese),    Consulted and Agree with Plan of Care  Patient       Patient will benefit  from skilled therapeutic intervention in order to improve the following deficits and impairments:  Decreased coordination, Improper body mechanics, Decreased safety awareness, Decreased endurance, Decreased activity tolerance, Decreased knowledge of precautions, Impaired UE functional use, Decreased knowledge of use of DME, Decreased cognition, Decreased mobility, Decreased strength, Impaired perceived functional ability, Impaired vision/preception  Visit Diagnosis: Neurologic neglect syndrome  Attention and concentration deficit  Visuospatial deficit    Problem List Patient Active Problem List   Diagnosis Date Noted  . DVT, lower extremity, distal, acute, right ([place]) 04/25/2018  . Acute lower UTI   . Atelectasis   . Hypoalbuminemia due to protein-calorie malnutrition ([place])   . Left temporal lobe hemorrhage ([place]) 04/15/2018  . Vascular headache   . Seizure prophylaxis   . Cerebral edema ([facility])   . Atrial fibrillation with rapid ventricular response ([place])   . Pneumonia   . Diabetes mellitus type 2 in nonobese ([facility])   . Benign essential HTN   . History of pancreatic cancer   . FUO (fever of unknown origin)   . Dysphagia, post-stroke   . Tachycardia   . Tachypnea   . Hypothyroidism   . Hypernatremia   . Hypokalemia   . Acute blood loss anemia   . Thrombocytopenia ([place])   . Intracranial hemorrhage ([place]) 04/05/2018  . Acute pancreatitis 06/11/2014  . Headache 06/11/2014  . 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 Obstructive jaundice 11/11/2012  . Biliary stricture 11/11/2012  . Diabetes mellitus ([place]) 11/10/2012  . HTN (hypertension) 11/10/2012  . Pancreatitis 11/10/2012  . Abdominal pain 11/10/2012    Carey Bullocks, OTR/L 06/04/2018, 1:10  PM  [facility] [address] [place], [place], [address] Phone: [phone]   Fax:  [phone]  Name: Hector Pugh MRN: [id number] Date of Birth: [birth year]

## 2018-06-10 ENCOUNTER — Ambulatory Visit: Payer: Managed Care, Other (non HMO)

## 2018-06-10 ENCOUNTER — Ambulatory Visit: Payer: Managed Care, Other (non HMO) | Admitting: Rehabilitation

## 2018-06-10 ENCOUNTER — Ambulatory Visit: Payer: Managed Care, Other (non HMO) | Admitting: Occupational Therapy

## 2018-06-10 DIAGNOSIS — R41841 Cognitive communication deficit: Secondary | ICD-10-CM

## 2018-06-10 DIAGNOSIS — R41840 Attention and concentration deficit: Secondary | ICD-10-CM

## 2018-06-10 DIAGNOSIS — R4701 Aphasia: Secondary | ICD-10-CM

## 2018-06-10 DIAGNOSIS — R414 Neurologic neglect syndrome: Secondary | ICD-10-CM | POA: Diagnosis not present

## 2018-06-10 DIAGNOSIS — R41842 Visuospatial deficit: Secondary | ICD-10-CM

## 2018-06-10 DIAGNOSIS — M6281 Muscle weakness (generalized): Secondary | ICD-10-CM

## 2018-06-10 NOTE — Therapy (Signed)
[facility] [address] [place], [place], [address] Phone: [phone]   Fax:  [phone]  Occupational Therapy Treatment  Patient Details  Name: Hector Pugh MRN: [id number] Date of Birth: Aug 23, 1961 Referring Provider: Dr. Delice Lesch   Encounter Date: 06/10/2018  OT End of Session - 06/10/18 0930    Visit Number  11    Number of Visits  17    Date for OT Re-Evaluation  06/30/18    Authorization Type  [employer]    OT Start Time  0845    OT Stop Time  0928    OT Time Calculation (min)  43 min    Activity Tolerance  Patient tolerated treatment well    Behavior During Therapy  [facility] for tasks assessed/performed       Past Medical History:  Diagnosis Date  . 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 Cancer ([place])   . Diabetes mellitus ([place]) 11/10/2012  . Diabetes mellitus without complication (HCC)    diet controlled  . HTN (hypertension) 11/10/2012  . Hypertension   . 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 Pancreatic cancer ([place])   . Pancreatitis 11/10/2012  . Seasonal allergies     Past Surgical History:  Procedure Laterality Date  . circucision    . circumsision    . COLONOSCOPY N/A 04/04/2018   Procedure: COLONOSCOPY;  Surgeon: Leighton Ruff, MD;  [facility];  Service: Endoscopy;  Laterality: N/A;  . CRANIOTOMY Left 04/05/2018   Procedure: Left Temporal CRANIOTOMY HEMATOMA EVACUATION of Intracranial Hemorrhage;  Surgeon: Ashok Pall, MD;  Location: [facility];  Service: Neurosurgery;  Laterality: Left;  . ERCP  11/08/2012   Procedure: ENDOSCOPIC RETROGRADE CHOLANGIOPANCREATOGRAPHY (ERCP);  Surgeon: Jeryl Columbia, MD;  [facility];  Service: Gastroenterology;  Laterality: N/A;  . EUS  11/08/2012   Procedure: UPPER ENDOSCOPIC ULTRASOUND (EUS) LINEAR;  Surgeon: Arta Silence, MD;  [facility];  Service: Endoscopy;  Laterality: N/A;  . FINE NEEDLE  ASPIRATION  11/08/2012   Procedure: FINE NEEDLE ASPIRATION (FNA) LINEAR;  Surgeon: Arta Silence, MD;  [facility];  Service: Endoscopy;  Laterality: N/A;  . HERNIA REPAIR     RIGHT  . LAPAROSCOPY  12/12/2012   Procedure: LAPAROSCOPY DIAGNOSTIC;  Surgeon: Stark Klein, MD;  [facility];  Service: General;  Laterality: N/A;  . Left Foot Surgery    . Left Foot Surgery    . VASECTOMY    . WHIPPLE PROCEDURE  12/12/2012   Procedure: WHIPPLE PROCEDURE;  Surgeon: Stark Klein, MD;  [facility];  Service: General;  Laterality: N/A;    There were no vitals filed for this visit.  Subjective Assessment - 06/10/18 0851    Subjective   I'm making chai at home without problems (daughter confirms this)    Pertinent History  PMH: Ampullary Adenacarcinoma, DM2, HTN, tachycardia, A-fib    Limitations  no driving, 74/1 sup, no strenuous activity or lifting    Patient Stated Goals  be independent    Currently in Pain?  No/denies                   OT Treatments/Exercises (OP) - 06/10/18 0001      ADLs   Cooking  Pt asked to make grilled cheese - pt required mod cueing to plan ahead and gather all necessary ingredients, scan Rt to find items in refrigerator and on countertop, and to adjust stove. Pt  then made grilled cheese with supervision. Pt turned off stove I'ly. However, pt took back items to refrigerator one by one vs. all at once.       Hand Exercises   Other Hand Exercises  Gripper set at level 2 resistance to pick up blocks Rt hand in particular order, scanning for colors. Pt completed tasks w/ no errors or difficulty in grip strength. Increased resistance to level 3 resistance to pick up 1/2 blocks (2nd trial) w/ min difficulty in grip strength              OT Short Term Goals - 06/03/18 1457      OT SHORT TERM GOAL #1   Title  Independent with visual HEP     Status  Achieved      OT SHORT TERM GOAL #2   Title  Independent with putty HEP for Rt hand     Status  Achieved      OT SHORT TERM GOAL #3   Title  Pt/family to verbalize understanding with visual compensatory strategies for Rt visual field cut    Status  Achieved      OT SHORT TERM GOAL #4   Title  Pt to perform simple snack prep and light household chores with supervision only    Status  On-going      OT SHORT TERM GOAL #5   Title  Pt to perform environmental scanning with 75% accuracy or greater    Status  Achieved 84% for basic        OT Long Term Goals - 06/10/18 0932      OT LONG TERM GOAL #1   Title  Pt to improve grip strength Rt dominant hand to 60 lbs or greater    Baseline  45 lbs    Time  8    Period  Weeks    Status  New      OT LONG TERM GOAL #2   Title  Pt to perform environmental scanning while performing simple physical task at 90% accuracy    Time  8    Period  Weeks    Status  New      OT LONG TERM GOAL #3   Title  Pt to return to cooking tasks with direct supervision only    Time  8    Period  Weeks    Status  On-going      OT LONG TERM GOAL #4   Title  Pt to return to home management tasks with direct supervision only    Time  8    Period  Weeks    Status  New            Plan - 06/10/18 0930    Clinical Impression Statement  Pt progressing with functional tasks. Recommend supervision with cooking    Occupational Profile and client history currently impacting functional performance  PMH: Ampullary adenacarcinoma, DM type 2, HTN, tachycardia, A-fib. 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[employer]    Occupational performance deficits (Please refer to evaluation for details):  ADL's;IADL's;Work;Leisure;Social Participation    Rehab Potential  Good    Current Impairments/barriers affecting progress:  visual deficits, aphasia    OT Frequency  2x / week    OT Duration  8 weeks    OT Treatment/Interventions  Self-care/ADL training;DME and/or AE instruction;Therapeutic activities;Therapeutic exercise;Cognitive  remediation/compensation;Coping strategies training;Neuromuscular education;Functional Mobility Training;Passive range of motion;Visual/perceptual remediation/compensation;Manual Therapy;Patient/family education  Plan  continue tabletop and environmental scanning    Consulted and Agree with Plan of Care  Patient       Patient will benefit from skilled therapeutic intervention in order to improve the following deficits and impairments:  Decreased coordination, Improper body mechanics, Decreased safety awareness, Decreased endurance, Decreased activity tolerance, Decreased knowledge of precautions, Impaired UE functional use, Decreased knowledge of use of DME, Decreased cognition, Decreased mobility, Decreased strength, Impaired perceived functional ability, Impaired vision/preception  Visit Diagnosis: Neurologic neglect syndrome  Attention and concentration deficit  Visuospatial deficit  Muscle weakness (generalized)    Problem List Patient Active Problem List   Diagnosis Date Noted  . DVT, lower extremity, distal, acute, right ([place]) 04/25/2018  . Acute lower UTI   . Atelectasis   . Hypoalbuminemia due to protein-calorie malnutrition ([place])   . Left temporal lobe hemorrhage ([place]) 04/15/2018  . Vascular headache   . Seizure prophylaxis   . Cerebral edema (HCC)   . Atrial fibrillation with rapid ventricular response ([place])   . Pneumonia   . Diabetes mellitus type 2 in nonobese (HCC)   . Benign essential HTN   . History of pancreatic cancer   . FUO (fever of unknown origin)   . Dysphagia, post-stroke   . Tachycardia   . Tachypnea   . Hypothyroidism   . Hypernatremia   . Hypokalemia   . Acute blood loss anemia   . Thrombocytopenia ([place])   . Intracranial hemorrhage ([place]) 04/05/2018  . Acute pancreatitis 06/11/2014  . Headache 06/11/2014  . 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 Ampullary carcinoma ([place]) 12/06/2012  . Obstructive jaundice 11/11/2012  . Biliary  stricture 11/11/2012  . Diabetes mellitus ([place]) 11/10/2012  . HTN (hypertension) 11/10/2012  . Pancreatitis 11/10/2012  . Abdominal pain 11/10/2012    Carey Bullocks, OTR/L 06/10/2018, 10:50 AM  [facility] [address] [place], [place], [address] Phone: [phone]   Fax:  [phone]  Name: Morgen Ritacco MRN: [id number] Date of Birth: 1961/08/12

## 2018-06-10 NOTE — Therapy (Signed)
[facility] [address] [place], [place], [address] Phone: [phone]   Fax:  [phone]  Speech Language Pathology Treatment  Patient Details  Name: Hector Pugh MRN: [id number] Date of Birth: 30-Mar-1961 Referring Provider: Delice Lesch, MD   Encounter Date: 06/10/2018  End of Session - 06/10/18 1115    Visit Number  6    Number of Visits  17    Date for SLP Re-Evaluation  08/01/18    Authorization Type  pre-auth - [employer]    Authorization Time Period  05-10-18 to 11-09-18    Authorization - Visit Number  6    Authorization - Number of Visits  25    SLP Start Time  7672    SLP Stop Time   1105    SLP Time Calculation (min)  30 min    Activity Tolerance  Patient tolerated treatment well       Past Medical History:  Diagnosis Date  . 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 Cancer ([place])   . Diabetes mellitus ([place]) 11/10/2012  . Diabetes mellitus without complication (HCC)    diet controlled  . HTN (hypertension) 11/10/2012  . Hypertension   . 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 Pancreatic cancer ([place])   . Pancreatitis 11/10/2012  . Seasonal allergies     Past Surgical History:  Procedure Laterality Date  . circucision    . circumsision    . COLONOSCOPY N/A 04/04/2018   Procedure: COLONOSCOPY;  Surgeon: Leighton Ruff, MD;  [facility];  Service: Endoscopy;  Laterality: N/A;  . CRANIOTOMY Left 04/05/2018   Procedure: Left Temporal CRANIOTOMY HEMATOMA EVACUATION of Intracranial Hemorrhage;  Surgeon: Ashok Pall, MD;  Location: [facility];  Service: Neurosurgery;  Laterality: Left;  . ERCP  11/08/2012   Procedure: ENDOSCOPIC RETROGRADE CHOLANGIOPANCREATOGRAPHY (ERCP);  Surgeon: Jeryl Columbia, MD;  [facility];  Service: Gastroenterology;  Laterality: N/A;  . EUS  11/08/2012   Procedure: UPPER ENDOSCOPIC ULTRASOUND (EUS) LINEAR;  Surgeon: Arta Silence, MD;   [facility];  Service: Endoscopy;  Laterality: N/A;  . FINE NEEDLE ASPIRATION  11/08/2012   Procedure: FINE NEEDLE ASPIRATION (FNA) LINEAR;  Surgeon: Arta Silence, MD;  [facility];  Service: Endoscopy;  Laterality: N/A;  . HERNIA REPAIR     RIGHT  . LAPAROSCOPY  12/12/2012   Procedure: LAPAROSCOPY DIAGNOSTIC;  Surgeon: Stark Klein, MD;  [facility];  Service: General;  Laterality: N/A;  . Left Foot Surgery    . Left Foot Surgery    . VASECTOMY    . WHIPPLE PROCEDURE  12/12/2012   Procedure: WHIPPLE PROCEDURE;  Surgeon: Stark Klein, MD;  [facility];  Service: General;  Laterality: N/A;    There were no vitals filed for this visit.  Subjective Assessment - 06/10/18 1048    Subjective  "Oh, I'm fine."    Currently in Pain?  No/denies            ADULT SLP TREATMENT - 06/10/18 1048      General Information   Behavior/Cognition  Alert;Pleasant mood;Cooperative;Impulsive      Treatment Provided   Treatment provided  Cognitive-Linquistic      Cognitive-Linquistic Treatment   Treatment focused on  Cognition    Skilled Treatment  Memory and attention/attention to detail were targeted today in organizing 8-step sequences. Mod A usually for attention/attention to detail, which ws mostly pt's reason for sequencing correctly.  Assessment / Recommendations / Plan   Plan  Continue with current plan of care      Progression Toward Goals   Progression toward goals  Progressing toward goals         SLP Short Term Goals - 06/10/18 1649      SLP SHORT TERM GOAL #1   Title  pt will generate sentence responses in min-mod complex language tasks 80% accuracy with min-mod A occasionally for error awareness    Time  1    Period  Weeks ot 9 total sessions, for all STGs    Status  On-going      SLP SHORT TERM GOAL #2   Title  pt will read sentences of 8-9 words with error awareness at 90% over three sessions    Time  1    Period  Weeks    Status   On-going      SLP SHORT TERM GOAL #3   Title  pt will demo understanding of spoken sentence level stimuli with 80% success and occasional min A over two sessions    Time  1    Period  Weeks    Status  Revised      SLP SHORT TERM GOAL #4   Title  pt will complete cognitive linguistic testing within first 4 therapy sessions    Period  -- or 5 total sessions    Status  Achieved      SLP SHORT TERM GOAL #5   Title  pt will complete a HEP for increasing accuracy of his verbal output with 90% success with rare min A for error awareness    Time  1    Period  Weeks    Status  On-going      SLP SHORT TERM GOAL #6   Title  Pt will selectively attend to structured cognitive lingustic tasks in mildly distracting environment with 85% on task and occasional min A for attention over 2 sessions    Time  3    Period  Weeks    Status  On-going       SLP Long Term Goals - 06/10/18 1650      SLP LONG TERM GOAL #1   Title  pt will complete a HEP for improving accuracy of his verbal output with 90% success over three sessions    Time  5    Period  Weeks or 17 sessions    Status  On-going      SLP LONG TERM GOAL #2   Title  pt will participate in 8 minutes of mod complex conversation appropriately, with modified independence    Time  5    Period  Weeks or 17 sessions    Status  On-going      SLP LONG TERM GOAL #3   Title  pt will demo understanding of 8 minutes mod complex conversation over three visits, with modified independence    Time  6    Period  Weeks or 17 visits    Status  On-going      SLP LONG TERM GOAL #4   Title  pt will demo understanding of 15 minutes mod complex/complex conversation over three visits, with modified independence    Time  10    Period  Weeks or 25 total visits    Status  On-going      SLP LONG TERM GOAL #5   Title  pt will demo awareness of verbal speech errors by correcting 95%  of errors in 15 minutes mod complex/complex conversation over two sessions     Time  10    Period  Weeks or 25 visits    Status  On-going      SLP LONG TERM GOAL #6   Title  Pt will alternate attention between 2 simple cognitive linguistic tasks with 80% on each and occasional min A over 2 sessions    Time  10    Period  Weeks    Status  New      SLP LONG TERM GOAL #7   Title  Pt will utilize external aids for memory, schedule management, med management with occasional min A from family/caregivers over 2 sessions    Time  10    Period  Weeks    Status  New       Plan - 06/10/18 1647    Clinical Impression Statement  Mr. Cossey cont to present today with 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 He would cont to benefit from skilled ST targeting langauge skills (recpetive and expressive), and from cognitive linguistic thearpy in order to maximize langauge and cognitive skills.    Speech Therapy Frequency  2x / week    Duration  -- 12 weeks, or 25 total visits    Treatment/Interventions  Internal/external aids;Patient/family education;Compensatory strategies;SLP instruction and feedback;Functional tasks;Cognitive reorganization;Cueing hierarchy;Environmental controls;Language facilitation    Potential to Achieve Goals  Good    Consulted and Agree with Plan of Care  Patient;Family member/caregiver    Family Member Consulted  daughter       Patient will benefit from skilled therapeutic intervention in order to improve the following deficits and impairments:   Cognitive communication deficit  Aphasia    Problem List Patient Active Problem List   Diagnosis Date Noted  . DVT, lower extremity, distal, acute, right ([place]) 04/25/2018  . Acute lower UTI   . Atelectasis   . Hypoalbuminemia due to protein-calorie malnutrition ([place])   . Left temporal lobe hemorrhage ([place]) 04/15/2018  . Vascular headache   . Seizure prophylaxis   . Cerebral edema (H[facility])   . Atrial fibrillation with rapid ventricular response ([place])   . Pneumonia   .  Diabetes mellitus type 2 in nonobese (HCC)   . Benign essential HTN   . History of pancreatic cancer   . FUO (fever of unknown origin)   . Dysphagia, post-stroke   . Tachycardia   . Tachypnea   . Hypothyroidism   . Hypernatremia   . Hypokalemia   . Acute blood loss anemia   . Thrombocytopenia ([place])   . Intracranial hemorrhage ([place]) 04/05/2018  . Acute pancreatitis 06/11/2014  . Headache 06/11/2014  . 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 Ampullary carcinoma ([place]) 12/06/2012  . Obstructive jaundice 11/11/2012  . Biliary stricture 11/11/2012  . Diabetes mellitus ([place]) 11/10/2012  . HTN (hypertension) 11/10/2012  . Pancreatitis 11/10/2012  . Abdominal pain 11/10/2012    [facility] ,[place], [place]  06/10/2018, 4:51 PM  [facility] [address] [place], [place], [address] Phone: [phone]   Fax:  [phone]   Name: Hector Pugh MRN: [id number] Date of Birth: 06-Sep-1961

## 2018-06-10 NOTE — Patient Instructions (Signed)
  Please complete the assigned speech therapy homework prior to your next session and return it to the speech therapist at your next visit.  

## 2018-06-12 ENCOUNTER — Encounter: Payer: Managed Care, Other (non HMO) | Admitting: Speech Pathology

## 2018-06-12 ENCOUNTER — Encounter: Payer: Managed Care, Other (non HMO) | Admitting: Occupational Therapy

## 2018-06-14 ENCOUNTER — Ambulatory Visit: Payer: Managed Care, Other (non HMO) | Admitting: Occupational Therapy

## 2018-06-14 ENCOUNTER — Ambulatory Visit: Payer: Managed Care, Other (non HMO) | Admitting: Rehabilitation

## 2018-06-14 ENCOUNTER — Ambulatory Visit: Payer: Managed Care, Other (non HMO)

## 2018-06-14 DIAGNOSIS — R41842 Visuospatial deficit: Secondary | ICD-10-CM

## 2018-06-14 DIAGNOSIS — R414 Neurologic neglect syndrome: Secondary | ICD-10-CM | POA: Diagnosis not present

## 2018-06-14 DIAGNOSIS — R41840 Attention and concentration deficit: Secondary | ICD-10-CM

## 2018-06-14 DIAGNOSIS — R278 Other lack of coordination: Secondary | ICD-10-CM

## 2018-06-14 DIAGNOSIS — R41841 Cognitive communication deficit: Secondary | ICD-10-CM

## 2018-06-14 DIAGNOSIS — R4701 Aphasia: Secondary | ICD-10-CM

## 2018-06-14 NOTE — Therapy (Signed)
[facility] [address] [place], [place], [address] Phone: [phone]   Fax:  [phone]  Occupational Therapy Treatment  Patient Details  Name: Hector Pugh MRN: [id number] Date of Birth: 12/22/1960 Referring Provider: Dr. Delice Lesch   Encounter Date: 06/14/2018  OT End of Session - 06/14/18 1408    Visit Number  12    Number of Visits  17    Date for OT Re-Evaluation  06/30/18    OT Start Time  1405    OT Stop Time  1445    OT Time Calculation (min)  40 min    Activity Tolerance  Patient tolerated treatment well    Behavior During Therapy  [facility] for tasks assessed/performed       Past Medical History:  Diagnosis Date  . 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 Cancer ([place])   . Diabetes mellitus ([place]) 11/10/2012  . Diabetes mellitus without complication (HCC)    diet controlled  . HTN (hypertension) 11/10/2012  . Hypertension   . 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 Pancreatic cancer ([place])   . Pancreatitis 11/10/2012  . Seasonal allergies     Past Surgical History:  Procedure Laterality Date  . circucision    . circumsision    . COLONOSCOPY N/A 04/04/2018   Procedure: COLONOSCOPY;  Surgeon: Leighton Ruff, MD;  [facility];  Service: Endoscopy;  Laterality: N/A;  . CRANIOTOMY Left 04/05/2018   Procedure: Left Temporal CRANIOTOMY HEMATOMA EVACUATION of Intracranial Hemorrhage;  Surgeon: Ashok Pall, MD;  Location: [facility];  Service: Neurosurgery;  Laterality: Left;  . ERCP  11/08/2012   Procedure: ENDOSCOPIC RETROGRADE CHOLANGIOPANCREATOGRAPHY (ERCP);  Surgeon: Jeryl Columbia, MD;  [facility];  Service: Gastroenterology;  Laterality: N/A;  . EUS  11/08/2012   Procedure: UPPER ENDOSCOPIC ULTRASOUND (EUS) LINEAR;  Surgeon: Arta Silence, MD;  [facility];  Service: Endoscopy;  Laterality: N/A;  . FINE NEEDLE ASPIRATION  11/08/2012    Procedure: FINE NEEDLE ASPIRATION (FNA) LINEAR;  Surgeon: Arta Silence, MD;  [facility];  Service: Endoscopy;  Laterality: N/A;  . HERNIA REPAIR     RIGHT  . LAPAROSCOPY  12/12/2012   Procedure: LAPAROSCOPY DIAGNOSTIC;  Surgeon: Stark Klein, MD;  [facility];  Service: General;  Laterality: N/A;  . Left Foot Surgery    . Left Foot Surgery    . VASECTOMY    . WHIPPLE PROCEDURE  12/12/2012   Procedure: WHIPPLE PROCEDURE;  Surgeon: Stark Klein, MD;  [facility];  Service: General;  Laterality: N/A;    There were no vitals filed for this visit.  Subjective Assessment - 06/14/18 1407    Pertinent History  PMH: Ampullary Adenacarcinoma, DM2, HTN, tachycardia, A-fib    Limitations  no driving, 51/8 sup, no strenuous activity or lifting    Patient Stated Goals  be independent    Currently in Pain?  No/denies               Treatment: Tabletop activities for visual memory and alternating attention, min-mod difficulty and min v.c Environmental scanning task locating 2 different colors alternately, 100% accuracy today Pipetree design moderate complex, pt initiated organizing pieces and completed design correctly.  Small peg design with RUE for improved visual perceptual skills, increased time required however pt copied design correctly.              OT Short Term Goals - 06/03/18 1457  OT SHORT TERM GOAL #1   Title  Independent with visual HEP     Status  Achieved      OT SHORT TERM GOAL #2   Title  Independent with putty HEP for Rt hand    Status  Achieved      OT SHORT TERM GOAL #3   Title  Pt/family to verbalize understanding with visual compensatory strategies for Rt visual field cut    Status  Achieved      OT SHORT TERM GOAL #4   Title  Pt to perform simple snack prep and light household chores with supervision only    Status  On-going      OT SHORT TERM GOAL #5   Title  Pt to perform environmental scanning with 75% accuracy or  greater    Status  Achieved 84% for basic        OT Long Term Goals - 06/10/18 0932      OT LONG TERM GOAL #1   Title  Pt to improve grip strength Rt dominant hand to 60 lbs or greater    Baseline  45 lbs    Time  8    Period  Weeks    Status  New      OT LONG TERM GOAL #2   Title  Pt to perform environmental scanning while performing simple physical task at 90% accuracy    Time  8    Period  Weeks    Status  New      OT LONG TERM GOAL #3   Title  Pt to return to cooking tasks with direct supervision only    Time  8    Period  Weeks    Status  On-going      OT LONG TERM GOAL #4   Title  Pt to return to home management tasks with direct supervision only    Time  8    Period  Weeks    Status  New            Plan - 06/14/18 1408    Clinical Impression Statement  Pt progressing with functional tasks requiring  a visual and cognitve component.    Rehab Potential  Good    Current Impairments/barriers affecting progress:  visual deficits, aphasia    OT Frequency  2x / week    OT Duration  8 weeks    OT Treatment/Interventions  Self-care/ADL training;DME and/or AE instruction;Therapeutic activities;Therapeutic exercise;Cognitive remediation/compensation;Coping strategies training;Neuromuscular education;Functional Mobility Training;Passive range of motion;Visual/perceptual remediation/compensation;Manual Therapy;Patient/family education    Plan  continue tabletop and environmental scanning       Patient will benefit from skilled therapeutic intervention in order to improve the following deficits and impairments:  Decreased coordination, Improper body mechanics, Decreased safety awareness, Decreased endurance, Decreased activity tolerance, Decreased knowledge of precautions, Impaired UE functional use, Decreased knowledge of use of DME, Decreased cognition, Decreased mobility, Decreased strength, Impaired perceived functional ability, Impaired vision/preception  Visit  Diagnosis: Attention and concentration deficit  Muscle weakness (generalized)  Visuospatial deficit    Problem List Patient Active Problem List   Diagnosis Date Noted  . DVT, lower extremity, distal, acute, right ([place]) 04/25/2018  . Acute lower UTI   . Atelectasis   . Hypoalbuminemia due to protein-calorie malnutrition ([place])   . Left temporal lobe hemorrhage ([place]) 04/15/2018  . Vascular headache   . Seizure prophylaxis   . Cerebral edema (HCC)   . Atrial fibrillation with rapid ventricular response ([place])   .  Pneumonia   . Diabetes mellitus type 2 in nonobese (HCC)   . Benign essential HTN   . History of pancreatic cancer   . FUO (fever of unknown origin)   . Dysphagia, post-stroke   . Tachycardia   . Tachypnea   . Hypothyroidism   . Hypernatremia   . Hypokalemia   . Acute blood loss anemia   . Thrombocytopenia ([place])   . Intracranial hemorrhage ([place]) 04/05/2018  . Acute pancreatitis 06/11/2014  . Headache 06/11/2014  . 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 Ampullary carcinoma ([place]) 12/06/2012  . Obstructive jaundice 11/11/2012  . Biliary stricture 11/11/2012  . Diabetes mellitus ([place]) 11/10/2012  . HTN (hypertension) 11/10/2012  . Pancreatitis 11/10/2012  . Abdominal pain 11/10/2012    RINE,KATHRYN 06/14/2018, 2:10 PM  [facility] [address] [place], [place], [address] Phone: [phone]   Fax:  [phone]  Name: Hector Pugh MRN: [id number] Date of Birth: 1961-06-15

## 2018-06-14 NOTE — Patient Instructions (Signed)
  Please complete the assigned speech therapy homework prior to your next session and return it to the speech therapist at your next visit.  

## 2018-06-14 NOTE — Therapy (Signed)
[facility] [address] [place], [place], [address] Phone: [phone]   Fax:  [phone]  Speech Language Pathology Treatment  Patient Details  Name: Hector Pugh MRN: [id number] Date of Birth: December 11, 1960 Referring Provider: Delice Lesch, MD   Encounter Date: 06/14/2018  End of Session - 06/14/18 1530    Visit Number  7    Number of Visits  17    Date for SLP Re-Evaluation  08/01/18    Authorization Type  pre-auth - [employer]    Authorization Time Period  05-10-18 to 11-09-18    Authorization - Visit Number  7    Authorization - Number of Visits  25    SLP Start Time  6301    SLP Stop Time   1529    SLP Time Calculation (min)  40 min    Activity Tolerance  Patient tolerated treatment well       Past Medical History:  Diagnosis Date  . 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 Cancer ([place])   . Diabetes mellitus ([place]) 11/10/2012  . Diabetes mellitus without complication (HCC)    diet controlled  . HTN (hypertension) 11/10/2012  . Hypertension   . 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 Pancreatic cancer ([place])   . Pancreatitis 11/10/2012  . Seasonal allergies     Past Surgical History:  Procedure Laterality Date  . circucision    . circumsision    . COLONOSCOPY N/A 04/04/2018   Procedure: COLONOSCOPY;  Surgeon: Leighton Ruff, MD;  [facility];  Service: Endoscopy;  Laterality: N/A;  . CRANIOTOMY Left 04/05/2018   Procedure: Left Temporal CRANIOTOMY HEMATOMA EVACUATION of Intracranial Hemorrhage;  Surgeon: Ashok Pall, MD;  Location: [facility];  Service: Neurosurgery;  Laterality: Left;  . ERCP  11/08/2012   Procedure: ENDOSCOPIC RETROGRADE CHOLANGIOPANCREATOGRAPHY (ERCP);  Surgeon: Jeryl Columbia, MD;  Location: [facility];  Service: Gastroenterology;  Laterality: N/A;  . EUS  11/08/2012   Procedure: UPPER ENDOSCOPIC ULTRASOUND (EUS) LINEAR;  Surgeon: Arta Silence, MD;   [facility];  Service: Endoscopy;  Laterality: N/A;  . FINE NEEDLE ASPIRATION  11/08/2012   Procedure: FINE NEEDLE ASPIRATION (FNA) LINEAR;  Surgeon: Arta Silence, MD;  [facility];  Service: Endoscopy;  Laterality: N/A;  . HERNIA REPAIR     RIGHT  . LAPAROSCOPY  12/12/2012   Procedure: LAPAROSCOPY DIAGNOSTIC;  Surgeon: Stark Klein, MD;  [facility];  Service: General;  Laterality: N/A;  . Left Foot Surgery    . Left Foot Surgery    . VASECTOMY    . WHIPPLE PROCEDURE  12/12/2012   Procedure: WHIPPLE PROCEDURE;  Surgeon: Stark Klein, MD;  [facility];  Service: General;  Laterality: N/A;    There were no vitals filed for this visit.  Subjective Assessment - 06/14/18 1452    Subjective  Pt did not bring back homework.    Currently in Pain?  No/denies            ADULT SLP TREATMENT - 06/14/18 1452      General Information   Behavior/Cognition  Alert;Cooperative;Pleasant mood;Impulsive      Treatment Provided   Treatment provided  Cognitive-Linquistic      Cognitive-Linquistic Treatment   Treatment focused on  Aphasia    Skilled Treatment  SLP told pt to bring his homework back to his next ST session each and every time he is provided homework. SLP worked with pt's  auditory comprehension with 4-6 unit sentences/directions with consistent SLP mod (verbal and repetition cues) (i.e., "put a star next to the words that are more than 6 letters long."). Pt req'd initial mod cues but then completed the task with only rare min A and extra time.       Assessment / Recommendations / Plan   Plan  Continue with current plan of care      Progression Toward Goals   Progression toward goals  Progressing toward goals       SLP Education - 06/14/18 1530    Education Details  bring homework back each time he is given it    Person(s) Educated  Patient    Methods  Explanation    Comprehension  Verbalized understanding;Need further instruction       SLP  Short Term Goals - 06/14/18 1532      SLP SHORT TERM GOAL #1   Title  pt will generate sentence responses in min-mod complex language tasks 80% accuracy with min-mod A occasionally for error awareness    Time  1    Period  -- ot 9 total sessions, for all STGs    Status  On-going      SLP SHORT TERM GOAL #2   Title  pt will read sentences of 8-9 words with error awareness at 90% over three sessions    Time  1    Period  Weeks    Status  On-going      SLP SHORT TERM GOAL #3   Title  pt will demo understanding of spoken sentence level stimuli with 80% success and occasional min A over two sessions    Time  1    Period  Weeks    Status  Revised      SLP SHORT TERM GOAL #4   Title  pt will complete cognitive linguistic testing within first 4 therapy sessions    Period  -- or 5 total sessions    Status  Achieved      SLP SHORT TERM GOAL #5   Title  pt will complete a HEP for increasing accuracy of his verbal output with 90% success with rare min A for error awareness    Time  1    Period  Weeks    Status  On-going      SLP SHORT TERM GOAL #6   Title  Pt will selectively attend to structured cognitive lingustic tasks in mildly distracting environment with 85% on task and occasional min A for attention over 2 sessions    Time  3    Period  Weeks    Status  On-going       SLP Long Term Goals - 06/14/18 1533      SLP LONG TERM GOAL #1   Title  pt will complete a HEP for improving accuracy of his verbal output with 90% success over three sessions    Time  5    Period  Weeks or 17 sessions    Status  On-going      SLP LONG TERM GOAL #2   Title  pt will participate in 8 minutes of mod complex conversation appropriately, with modified independence    Time  5    Period  Weeks or 17 sessions    Status  On-going      SLP LONG TERM GOAL #3   Title  pt will demo understanding of 8 minutes mod complex conversation over three visits, with modified  independence    Time  6    Period   Weeks or 17 visits    Status  On-going      SLP LONG TERM GOAL #4   Title  pt will demo understanding of 15 minutes mod complex/complex conversation over three visits, with modified independence    Time  10    Period  Weeks or 25 total visits    Status  On-going      SLP LONG TERM GOAL #5   Title  pt will demo awareness of verbal speech errors by correcting 95% of errors in 15 minutes mod complex/complex conversation over two sessions    Time  10    Period  Weeks or 25 visits    Status  On-going      SLP LONG TERM GOAL #6   Title  Pt will alternate attention between 2 simple cognitive linguistic tasks with 80% on each and occasional min A over 2 sessions    Time  10    Period  Weeks    Status  New      SLP LONG TERM GOAL #7   Title  Pt will utilize external aids for memory, schedule management, med management with occasional min A from family/caregivers over 2 sessions    Time  10    Period  Weeks    Status  New       Plan - 06/14/18 1531    Clinical Impression Statement  Mr. 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 Extra time and SLP cues were needed for receptive language (auditory comprehension). He would cont to benefit from skilled ST targeting langauge skills (recpetive and expressive), and from cognitive linguistic thearpy in order to maximize langauge and cognitive skills.    Speech Therapy Frequency  2x / week    Duration  -- 12 weeks or 25 total visits    Treatment/Interventions  Internal/external aids;Patient/family education;Compensatory strategies;SLP instruction and feedback;Functional tasks;Cognitive reorganization;Cueing hierarchy;Environmental controls;Language facilitation    Potential to Achieve Goals  Good    Potential Considerations  Ability to learn/carryover information       Patient will benefit from skilled therapeutic intervention in order to improve the following deficits and impairments:    Aphasia  Cognitive communication deficit    Problem List Patient Active Problem List   Diagnosis Date Noted  . DVT, lower extremity, distal, acute, right ([place]) 04/25/2018  . Acute lower UTI   . Atelectasis   . Hypoalbuminemia due to protein-calorie malnutrition ([facility])   . Left temporal lobe hemorrhage ([facility]) 04/15/2018  . Vascular headache   . Seizure prophylaxis   . Cerebral edema ([facility])   . Atrial fibrillation with rapid ventricular response ([place])   . Pneumonia   . Diabetes mellitus type 2 in nonobese (HCC)   . Benign essential HTN   . History of pancreatic cancer   . FUO (fever of unknown origin)   . Dysphagia, post-stroke   . Tachycardia   . Tachypnea   . Hypothyroidism   . Hypernatremia   . Hypokalemia   . Acute blood loss anemia   . Thrombocytopenia ([facility])   . Intracranial hemorrhage ([facility]) 04/05/2018  . Acute pancreatitis 06/11/2014  . Headache 06/11/2014  . 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 Obstructive jaundice 11/11/2012  . Biliary stricture 11/11/2012  . Diabetes mellitus ([facility]) 11/10/2012  . HTN (hypertension) 11/10/2012  . Pancreatitis 11/10/2012  . Abdominal pain  11/10/2012    [facility] ,[place], [place]  06/14/2018, 3:34 PM  [facility] [address] [place], [place], [address] Phone: [phone]   Fax:  [phone]   Name: Hector Pugh MRN: [id number] Date of Birth: [birth year]

## 2018-06-18 ENCOUNTER — Ambulatory Visit: Payer: Self-pay | Admitting: Neurology

## 2018-06-18 ENCOUNTER — Ambulatory Visit: Payer: Managed Care, Other (non HMO) | Admitting: Occupational Therapy

## 2018-06-18 ENCOUNTER — Ambulatory Visit: Payer: Managed Care, Other (non HMO)

## 2018-06-18 DIAGNOSIS — R41841 Cognitive communication deficit: Secondary | ICD-10-CM

## 2018-06-18 DIAGNOSIS — R4701 Aphasia: Secondary | ICD-10-CM

## 2018-06-18 DIAGNOSIS — R41842 Visuospatial deficit: Secondary | ICD-10-CM

## 2018-06-18 DIAGNOSIS — R414 Neurologic neglect syndrome: Secondary | ICD-10-CM | POA: Diagnosis not present

## 2018-06-18 DIAGNOSIS — I69118 Other symptoms and signs involving cognitive functions following nontraumatic intracerebral hemorrhage: Secondary | ICD-10-CM

## 2018-06-18 NOTE — Therapy (Signed)
[facility] [address] [place], [place], [address] Phone: [phone]   Fax:  [phone]  Speech Language Pathology Treatment  Patient Details  Name: Hector Pugh MRN: [id number] Date of Birth: 1961-05-29 Referring Provider: Delice Lesch, MD   Encounter Date: 06/18/2018  End of Session - 06/18/18 1650    Visit Number  8    Number of Visits  25    Date for SLP Re-Evaluation  08/01/18    Authorization Time Period  05-10-18 to 11-09-18    Authorization - Visit Number  8    Authorization - Number of Visits  25    SLP Start Time  2297    SLP Stop Time   9892    SLP Time Calculation (min)  42 min    Activity Tolerance  Patient tolerated treatment well       Past Medical History:  Diagnosis Date  . 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 Cancer ([facility])   . Diabetes mellitus ([facility]) 11/10/2012  . Diabetes mellitus without complication (HCC)    diet controlled  . HTN (hypertension) 11/10/2012  . Hypertension   . 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 Pancreatic cancer ([facility])   . Pancreatitis 11/10/2012  . Seasonal allergies     Past Surgical History:  Procedure Laterality Date  . circucision    . circumsision    . COLONOSCOPY N/A 04/04/2018   Procedure: COLONOSCOPY;  Surgeon: Leighton Ruff, MD;  [facility];  Service: Endoscopy;  Laterality: N/A;  . CRANIOTOMY Left 04/05/2018   Procedure: Left Temporal CRANIOTOMY HEMATOMA EVACUATION of Intracranial Hemorrhage;  Surgeon: Ashok Pall, MD;  Location: [facility];  Service: Neurosurgery;  Laterality: Left;  . ERCP  11/08/2012   Procedure: ENDOSCOPIC RETROGRADE CHOLANGIOPANCREATOGRAPHY (ERCP);  Surgeon: Jeryl Columbia, MD;  Location[facility];  Service: Gastroenterology;  Laterality: N/A;  . EUS  11/08/2012   Procedure: UPPER ENDOSCOPIC ULTRASOUND (EUS) LINEAR;  Surgeon: Arta Silence, MD;  [facility];  Service:  Endoscopy;  Laterality: N/A;  . FINE NEEDLE ASPIRATION  11/08/2012   Procedure: FINE NEEDLE ASPIRATION (FNA) LINEAR;  Surgeon: Arta Silence, MD;  [facility];  Service: Endoscopy;  Laterality: N/A;  . HERNIA REPAIR     RIGHT  . LAPAROSCOPY  12/12/2012   Procedure: LAPAROSCOPY DIAGNOSTIC;  Surgeon: Stark Klein, MD;  [facility];  Service: General;  Laterality: N/A;  . Left Foot Surgery    . Left Foot Surgery    . VASECTOMY    . WHIPPLE PROCEDURE  12/12/2012   Procedure: WHIPPLE PROCEDURE;  Surgeon: Stark Klein, MD;  [facility];  Service: General;  Laterality: N/A;    There were no vitals filed for this visit.  Subjective Assessment - 06/18/18 1609    Subjective  Pt brought homework.     Currently in Pain?  No/denies            ADULT SLP TREATMENT - 06/18/18 1613      General Information   Behavior/Cognition  Cooperative;Pleasant mood      Treatment Provided   Treatment provided  Cognitive-Linquistic      Cognitive-Linquistic Treatment   Treatment focused on  Cognition    Skilled Treatment  SLP reviewed pt's homework with him - pt skipped 1/3 of the entries. "I didn't know how to do those," however pt made (incorrect) attempts at them with SLP present. Pt with reduced emergent awarenes/error awareness.  He req'd cues for attention to detail/reasoning as he placed activities too close together (simple calendar task with 25% "open ended timing" stimuli). Pt with detailed alternating attention figure/question task with min-mod A occasionally. Pt with aphasic reading - requires rare min A.       Assessment / Recommendations / Plan   Plan  Continue with current plan of care      Progression Toward Goals   Progression toward goals  Progressing toward goals         SLP Short Term Goals - 06/14/18 1532      SLP SHORT TERM GOAL #1   Title  pt will generate sentence responses in min-mod complex language tasks 80% accuracy with min-mod A occasionally for  error awareness    Time  1    Period  -- ot 9 total sessions, for all STGs    Status  On-going      SLP SHORT TERM GOAL #2   Title  pt will read sentences of 8-9 words with error awareness at 90% over three sessions    Time  1    Period  Weeks    Status  On-going      SLP SHORT TERM GOAL #3   Title  pt will demo understanding of spoken sentence level stimuli with 80% success and occasional min A over two sessions    Time  1    Period  Weeks    Status  Revised      SLP SHORT TERM GOAL #4   Title  pt will complete cognitive linguistic testing within first 4 therapy sessions    Period  -- or 5 total sessions    Status  Achieved      SLP SHORT TERM GOAL #5   Title  pt will complete a HEP for increasing accuracy of his verbal output with 90% success with rare min A for error awareness    Time  1    Period  Weeks    Status  On-going      SLP SHORT TERM GOAL #6   Title  Pt will selectively attend to structured cognitive lingustic tasks in mildly distracting environment with 85% on task and occasional min A for attention over 2 sessions    Time  3    Period  Weeks    Status  On-going       SLP Long Term Goals - 06/18/18 1652      SLP LONG TERM GOAL #1   Title  pt will complete a HEP for improving accuracy of his verbal output with 90% success over three sessions    Time  4    Period  Weeks or 17 sessions    Status  On-going      SLP LONG TERM GOAL #2   Title  pt will participate in 8 minutes of mod complex conversation appropriately, with modified independence    Time  4    Period  Weeks or 17 sessions    Status  On-going      SLP LONG TERM GOAL #3   Title  pt will demo understanding of 8 minutes mod complex conversation over three visits, with modified independence    Time  5    Period  Weeks or 17 visits    Status  On-going      SLP LONG TERM GOAL #4   Title  pt will demo understanding of 15 minutes mod complex/complex conversation over three visits, with  modified  independence    Time  9    Period  Weeks or 25 total visits    Status  On-going      SLP LONG TERM GOAL #5   Title  pt will demo awareness of verbal speech errors by correcting 95% of errors in 15 minutes mod complex/complex conversation over two sessions    Time  9    Period  Weeks or 25 visits    Status  On-going      SLP LONG TERM GOAL #6   Title  Pt will alternate attention between 2 simple cognitive linguistic tasks with 80% on each and occasional min A over 2 sessions    Time  9    Period  Weeks    Status  On-going      SLP LONG TERM GOAL #7   Title  Pt will utilize external aids for memory, schedule management, med management with occasional min A from family/caregivers over 2 sessions    Time  9    Period  Weeks    Status  On-going       Plan - 06/18/18 1650    Clinical Impression Statement  Mr. 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 Extra time and SLP cues were needed for cognitive linguistic tasks - see "skilled intervention" for details. He would cont to benefit from skilled ST targeting langauge skills (recpetive and expressive), and from cognitive linguistic thearpy in order to maximize langauge and cognitive skills.    Speech Therapy Frequency  2x / week    Duration  -- 12 weeks or 25 total visits    Treatment/Interventions  Internal/external aids;Patient/family education;Compensatory strategies;SLP instruction and feedback;Functional tasks;Cognitive reorganization;Cueing hierarchy;Environmental controls;Language facilitation    Potential to Achieve Goals  Good    Potential Considerations  Ability to learn/carryover information       Patient will benefit from skilled therapeutic intervention in order to improve the following deficits and impairments:   Aphasia  Cognitive communication deficit    Problem List Patient Active Problem List   Diagnosis Date Noted  . DVT, lower extremity, distal, acute,  right ([facility]) 04/25/2018  . Acute lower UTI   . Atelectasis   . Hypoalbuminemia due to protein-calorie malnutrition ([facility])   . Left temporal lobe hemorrhage ([facility]) 04/15/2018  . Vascular headache   . Seizure prophylaxis   . Cerebral edema ([facility])   . Atrial fibrillation with rapid ventricular response ([facility])   . Pneumonia   . Diabetes mellitus type 2 in nonobese ([facility])   . Benign essential HTN   . History of pancreatic cancer   . FUO (fever of unknown origin)   . Dysphagia, post-stroke   . Tachycardia   . Tachypnea   . Hypothyroidism   . Hypernatremia   . Hypokalemia   . Acute blood loss anemia   . Thrombocytopenia ([facility])   . Intracranial hemorrhage ([facility]) 04/05/2018  . Acute pancreatitis 06/11/2014  . Headache 06/11/2014  . 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 Ampullary carcinoma ([place] [facility]) 12/06/2012  . Obstructive jaundice 11/11/2012  . Biliary stricture 11/11/2012  . Diabetes mellitus ([place]) 11/10/2012  . HTN (hypertension) 11/10/2012  . Pancreatitis 11/10/2012  . Abdominal pain 11/10/2012    [facility] ,MS, CCC-SLP  06/18/2018, 4:53 PM  [facility] [address] [place], [place], [address] Phone: [phone]   Fax:  [phone]   Name: Hector Pugh MRN: [id number] Date of Birth: 23-Aug-1961

## 2018-06-18 NOTE — Therapy (Signed)
[facility] [address] [place], [place], [address] Phone: [phone]   Fax:  [phone]  Occupational Therapy Treatment  Patient Details  Name: Hector Pugh MRN: [id number] Date of Birth: 1961/09/16 Referring Provider: Dr. Delice Lesch   Encounter Date: 06/18/2018  OT End of Session - 06/18/18 1524    Visit Number  13    Number of Visits  17    Date for OT Re-Evaluation  06/30/18    Authorization Type  [employer]    OT Start Time  1450    OT Stop Time  1530    OT Time Calculation (min)  40 min    Activity Tolerance  Patient tolerated treatment well    Behavior During Therapy  [facility] for tasks assessed/performed       Past Medical History:  Diagnosis Date  . 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 Cancer ([place])   . Diabetes mellitus ([place]) 11/10/2012  . Diabetes mellitus without complication (HCC)    diet controlled  . HTN (hypertension) 11/10/2012  . Hypertension   . 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 Pancreatic cancer ([place])   . Pancreatitis 11/10/2012  . Seasonal allergies     Past Surgical History:  Procedure Laterality Date  . circucision    . circumsision    . COLONOSCOPY N/A 04/04/2018   Procedure: COLONOSCOPY;  Surgeon: Leighton Ruff, MD;  [facility];  Service: Endoscopy;  Laterality: N/A;  . CRANIOTOMY Left 04/05/2018   Procedure: Left Temporal CRANIOTOMY HEMATOMA EVACUATION of Intracranial Hemorrhage;  Surgeon: Ashok Pall, MD;  Location: [facility];  Service: Neurosurgery;  Laterality: Left;  . ERCP  11/08/2012   Procedure: ENDOSCOPIC RETROGRADE CHOLANGIOPANCREATOGRAPHY (ERCP);  Surgeon: Jeryl Columbia, MD;  [facility];  Service: Gastroenterology;  Laterality: N/A;  . EUS  11/08/2012   Procedure: UPPER ENDOSCOPIC ULTRASOUND (EUS) LINEAR;  Surgeon: Arta Silence, MD;  [facility];  Service: Endoscopy;  Laterality: N/A;  . FINE NEEDLE  ASPIRATION  11/08/2012   Procedure: FINE NEEDLE ASPIRATION (FNA) LINEAR;  Surgeon: Arta Silence, MD;  [facility];  Service: Endoscopy;  Laterality: N/A;  . HERNIA REPAIR     RIGHT  . LAPAROSCOPY  12/12/2012   Procedure: LAPAROSCOPY DIAGNOSTIC;  Surgeon: Stark Klein, MD;  [facility];  Service: General;  Laterality: N/A;  . Left Foot Surgery    . Left Foot Surgery    . VASECTOMY    . WHIPPLE PROCEDURE  12/12/2012   Procedure: WHIPPLE PROCEDURE;  Surgeon: Stark Klein, MD;  [facility];  Service: General;  Laterality: N/A;    There were no vitals filed for this visit.  Subjective Assessment - 06/18/18 1457    Subjective   Pt's dtr reports pt is performing light home management tasks without difficulty    Patient Stated Goals  be independent    Currently in Pain?  No/denies                 Treatment: Environmental scanning while ambulating and tossing ball, 62% accuracy. Pt demonstrates increased difficulty when he is asked to divide/ alternate  his attention. Tabletop activities for visual scanning while alphabetizing words, min difficulty / v.c  Tan gram activity for visual perceptual skills, pt completed basic designs without difficulty.          OT Education - 06/18/18 1531    Education Details  upgraded putty HEP to green  Person(s) Educated  Patient    Methods  Explanation;Demonstration    Comprehension  Verbalized understanding;Returned demonstration       OT Short Term Goals - 06/18/18 1453      OT SHORT TERM GOAL #1   Title  Independent with visual HEP     Status  Achieved      OT SHORT TERM GOAL #2   Title  Independent with putty HEP for Rt hand    Status  Achieved      OT SHORT TERM GOAL #3   Title  Pt/family to verbalize understanding with visual compensatory strategies for Rt visual field cut    Status  Achieved      OT SHORT TERM GOAL #4   Title  Pt to perform simple snack prep and light household chores with  supervision only    Status  Achieved      OT SHORT TERM GOAL #5   Title  Pt to perform environmental scanning with 75% accuracy or greater    Status  Achieved 84% for basic        OT Long Term Goals - 06/18/18 1455      OT LONG TERM GOAL #1   Title  Pt to improve grip strength Rt dominant hand to 60 lbs or greater    Baseline  45 lbs    Time  8    Period  Weeks    Status  On-going      OT LONG TERM GOAL #2   Title  Pt to perform environmental scanning while performing simple physical task at 90% accuracy    Time  8    Period  Weeks    Status  On-going      OT LONG TERM GOAL #3   Title  Pt to return to cooking tasks with direct supervision only    Time  8    Period  Weeks    Status  On-going      OT LONG TERM GOAL #4   Title  Pt to return to home management tasks with direct supervision only    Time  8    Period  Weeks    Status  On-going            Plan - 06/18/18 1525    Clinical Impression Statement  Pt progressing towards goals. Pt demonstrates decreased accuracy with environmental scanning when alternating attention with a physical task.    Occupational Profile and client history currently impacting functional performance  PMH: Ampullary adenacarcinoma, DM type 2, HTN, tachycardia, A-fib. 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[employer]    Occupational performance deficits (Please refer to evaluation for details):  ADL's;IADL's;Work;Leisure;Social Participation    Rehab Potential  Good    Current Impairments/barriers affecting progress:  visual deficits, aphasia    OT Frequency  2x / week    OT Duration  8 weeks    OT Treatment/Interventions  Self-care/ADL training;DME and/or AE instruction;Therapeutic activities;Therapeutic exercise;Cognitive remediation/compensation;Coping strategies training;Neuromuscular education;Functional Mobility Training;Passive range of motion;Visual/perceptual remediation/compensation;Manual  Therapy;Patient/family education    Plan  continue tabletop and environmental scanning    Consulted and Agree with Plan of Care  Patient       Patient will benefit from skilled therapeutic intervention in order to improve the following deficits and impairments:  Decreased coordination, Improper body mechanics, Decreased safety awareness, Decreased endurance, Decreased activity tolerance, Decreased knowledge of precautions, Impaired UE functional use, Decreased knowledge  of use of DME, Decreased cognition, Decreased mobility, Decreased strength, Impaired perceived functional ability, Impaired vision/preception  Visit Diagnosis: Visuospatial deficit  Other symptoms and signs 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 DVT, lower extremity, distal, acute, right ([place]) 04/25/2018  . Acute lower UTI   . Atelectasis   . Hypoalbuminemia due to protein-calorie malnutrition ([place])   . Left temporal lobe hemorrhage ([place]) 04/15/2018  . Vascular headache   . Seizure prophylaxis   . Cerebral edema (H[facility])   . Atrial fibrillation with rapid ventricular response ([place])   . Pneumonia   . Diabetes mellitus type 2 in nonobese (HCC)   . Benign essential HTN   . History of pancreatic cancer   . FUO (fever of unknown origin)   . Dysphagia, post-stroke   . Tachycardia   . Tachypnea   . Hypothyroidism   . Hypernatremia   . Hypokalemia   . Acute blood loss anemia   . Thrombocytopenia ([place])   . Intracranial hemorrhage ([place]) 04/05/2018  . Acute pancreatitis 06/11/2014  . Headache 06/11/2014  . 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 Ampullary carcinoma ([place]) 12/06/2012  . Obstructive jaundice 11/11/2012  . Biliary stricture 11/11/2012  . Diabetes mellitus ([place]) 11/10/2012  . HTN (hypertension) 11/10/2012  . Pancreatitis 11/10/2012  . Abdominal pain 11/10/2012     Cicley Ganesh 06/18/2018, 3:32 PM  [facility] [address] [place], [place], [address] Phone: [phone]   Fax:  [phone]  Name: March Steyer MRN: [id number] Date of Birth: 1961/10/20

## 2018-06-18 NOTE — Patient Instructions (Addendum)
  Please complete the assigned speech therapy homework prior to your next session and return it to the speech therapist at your next visit.  

## 2018-06-20 ENCOUNTER — Ambulatory Visit: Payer: Managed Care, Other (non HMO) | Admitting: Occupational Therapy

## 2018-06-20 ENCOUNTER — Ambulatory Visit: Payer: Managed Care, Other (non HMO)

## 2018-06-20 DIAGNOSIS — R414 Neurologic neglect syndrome: Secondary | ICD-10-CM | POA: Diagnosis not present

## 2018-06-20 DIAGNOSIS — R41841 Cognitive communication deficit: Secondary | ICD-10-CM

## 2018-06-20 DIAGNOSIS — R41840 Attention and concentration deficit: Secondary | ICD-10-CM

## 2018-06-20 DIAGNOSIS — R41842 Visuospatial deficit: Secondary | ICD-10-CM

## 2018-06-20 DIAGNOSIS — R4701 Aphasia: Secondary | ICD-10-CM

## 2018-06-20 NOTE — Therapy (Signed)
[facility] [address] [place], [place], [address] Phone: [phone]   Fax:  [phone]  Speech Language Pathology Treatment  Patient Details  Name: Hector Pugh MRN: [id number] Date of Birth: 01/08/1961 Referring Provider: Delice Lesch, MD   Encounter Date: 06/20/2018  End of Session - 06/20/18 1642    Visit Number  9    Number of Visits  25    Date for SLP Re-Evaluation  08/01/18    Authorization Type  pre-auth - [employer]    Authorization Time Period  05-10-18 to 11-09-18    Authorization - Visit Number  9    Authorization - Number of Visits  25    SLP Start Time  1655    SLP Stop Time   1615    SLP Time Calculation (min)  42 min    Activity Tolerance  Patient tolerated treatment well       Past Medical History:  Diagnosis Date  . 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 Cancer ([place])   . Diabetes mellitus ([place]) 11/10/2012  . Diabetes mellitus without complication (HCC)    diet controlled  . HTN (hypertension) 11/10/2012  . Hypertension   . 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 Pancreatic cancer ([place])   . Pancreatitis 11/10/2012  . Seasonal allergies     Past Surgical History:  Procedure Laterality Date  . circucision    . circumsision    . COLONOSCOPY N/A 04/04/2018   Procedure: COLONOSCOPY;  Surgeon: Leighton Ruff, MD;  [facility];  Service: Endoscopy;  Laterality: N/A;  . CRANIOTOMY Left 04/05/2018   Procedure: Left Temporal CRANIOTOMY HEMATOMA EVACUATION of Intracranial Hemorrhage;  Surgeon: Ashok Pall, MD;  Location: [facility];  Service: Neurosurgery;  Laterality: Left;  . ERCP  11/08/2012   Procedure: ENDOSCOPIC RETROGRADE CHOLANGIOPANCREATOGRAPHY (ERCP);  Surgeon: Jeryl Columbia, MD;  [facility];  Service: Gastroenterology;  Laterality: N/A;  . EUS  11/08/2012   Procedure: UPPER ENDOSCOPIC ULTRASOUND (EUS) LINEAR;  Surgeon: Arta Silence, MD;   [facility];  Service: Endoscopy;  Laterality: N/A;  . FINE NEEDLE ASPIRATION  11/08/2012   Procedure: FINE NEEDLE ASPIRATION (FNA) LINEAR;  Surgeon: Arta Silence, MD;  [facility];  Service: Endoscopy;  Laterality: N/A;  . HERNIA REPAIR     RIGHT  . LAPAROSCOPY  12/12/2012   Procedure: LAPAROSCOPY DIAGNOSTIC;  Surgeon: Stark Klein, MD;  [facility];  Service: General;  Laterality: N/A;  . Left Foot Surgery    . Left Foot Surgery    . VASECTOMY    . WHIPPLE PROCEDURE  12/12/2012   Procedure: WHIPPLE PROCEDURE;  Surgeon: Stark Klein, MD;  [facility];  Service: General;  Laterality: N/A;    There were no vitals filed for this visit.  Subjective Assessment - 06/20/18 1553    Subjective  Pt forogt notebook and thus did not have homework.            ADULT SLP TREATMENT - 06/20/18 1557      General Information   Behavior/Cognition  Cooperative;Pleasant mood      Treatment Provided   Treatment provided  Cognitive-Linquistic      Pain Assessment   Pain Assessment  No/denies pain      Cognitive-Linquistic Treatment   Treatment focused on  Cognition    Skilled Treatment  SLP targeted pt's attention and problem solving skills today with simple alternating attention task. Pt remained  on task for approx 7-8 minutes and then focus waned and pt made more errors. He req'd max A and req'd SLP to devise a plan for him to keep his place (""flower seeds I ordered..."). After this, pt req'd min A occasionally for alternating attention. He req'd min-mod A occasionally for reading.       Assessment / Recommendations / Plan   Plan  Continue with current plan of care      Progression Toward Goals   Progression toward goals  Progressing toward goals         SLP Short Term Goals - 06/20/18 1643      SLP SHORT TERM GOAL #1   Title  pt will generate sentence responses in min-mod complex language tasks 80% accuracy with min-mod A occasionally for error  awareness    Period  -- ot 9 total sessions, for all STGs    Status  Not Met      SLP SHORT TERM GOAL #2   Title  pt will read sentences of 8-9 words with error awareness at 90% over three sessions    Status  Not Met      SLP SHORT TERM GOAL #3   Title  pt will demo understanding of spoken sentence level stimuli with 80% success and occasional min A over two sessions    Status  Partially Met      SLP SHORT TERM GOAL #4   Title  pt will complete cognitive linguistic testing within first 4 therapy sessions    Period  -- or 5 total sessions    Status  Achieved      SLP SHORT TERM GOAL #5   Title  pt will complete a HEP for increasing accuracy of his verbal output with 90% success with rare min A for error awareness    Status  Not Met      SLP SHORT TERM GOAL #6   Title  Pt will selectively attend to structured cognitive lingustic tasks in mildly distracting environment with 85% on task and occasional min A for attention over 2 sessions    Time  3    Period  Weeks    Status  On-going       SLP Long Term Goals - 06/20/18 1645      SLP LONG TERM GOAL #1   Title  pt will complete a HEP for improving accuracy of his verbal output with 90% success over three sessions    Time  4    Period  Weeks or 17 sessions    Status  Deferred to work on cognitive linguistics      SLP LONG TERM GOAL #2   Title  pt will participate in 6 minutes of simple-mod complex conversation appropriately, with occasional min A    Time  4    Period  Weeks or 17 sessions    Status  Revised      SLP LONG TERM GOAL #3   Title  pt will demo understanding of 8 minutes simple-mod complex conversation over three visits, with occasional min A    Time  5    Period  Weeks or 17 visits    Status  Revised      SLP LONG TERM GOAL #4   Title  pt will demo understanding of 10 minutes mod complex conversation over three visits, with modified independence    Time  9    Period  Weeks or 25 total visits  Status   Revised      SLP LONG TERM GOAL #5   Title  pt will demo awareness of verbal speech errors by correcting 95% of errors in 10 minutes mod complex conversation over two sessions    Time  9    Period  Weeks or 25 visits    Status  Revised      SLP LONG TERM GOAL #6   Title  Pt will alternate attention between 2 simple cognitive linguistic tasks with 80% on each and occasional min A over 2 sessions    Time  9    Period  Weeks    Status  On-going      SLP LONG TERM GOAL #7   Title  Pt will utilize external aids for memory, schedule management, med management with occasional min A from family/caregivers over 2 sessions    Time  9    Period  Weeks    Status  On-going       Plan - 06/20/18 1642    Clinical Impression Statement  Mr. Nakanishi cont to present today with 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 Extra time and SLP cues were needed for cognitive linguistic tasks - see "skilled intervention" for details. It was learned today pt desires to return to work. He would cont to benefit from skilled ST targeting langauge skills (recpetive and expressive), and from cognitive linguistic thearpy in order to maximize langauge and cognitive skills.    Speech Therapy Frequency  2x / week    Duration  -- 12 weeks or 25 total visits    Treatment/Interventions  Internal/external aids;Patient/family education;Compensatory strategies;SLP instruction and feedback;Functional tasks;Cognitive reorganization;Cueing hierarchy;Environmental controls;Language facilitation    Potential to Achieve Goals  Good    Potential Considerations  Ability to learn/carryover information       Patient will benefit from skilled therapeutic intervention in order to improve the following deficits and impairments:   Aphasia  Cognitive communication deficit    Problem List Patient Active Problem List   Diagnosis Date Noted  . DVT, lower extremity, distal, acute, right ([place]) 04/25/2018  . Acute  lower UTI   . Atelectasis   . Hypoalbuminemia due to protein-calorie malnutrition ([place])   . Left temporal lobe hemorrhage ([place]) 04/15/2018  . Vascular headache   . Seizure prophylaxis   . Cerebral edema ([facility])   . Atrial fibrillation with rapid ventricular response ([place])   . Pneumonia   . Diabetes mellitus type 2 in nonobese ([facility])   . Benign essential HTN   . History of pancreatic cancer   . FUO (fever of unknown origin)   . Dysphagia, post-stroke   . Tachycardia   . Tachypnea   . Hypothyroidism   . Hypernatremia   . Hypokalemia   . Acute blood loss anemia   . Thrombocytopenia ([place])   . Intracranial hemorrhage ([place]) 04/05/2018  . Acute pancreatitis 06/11/2014  . Headache 06/11/2014  . 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 Ampullary carcinoma ([place]) 12/06/2012  . Obstructive jaundice 11/11/2012  . Biliary stricture 11/11/2012  . Diabetes mellitus ([place]) 11/10/2012  . HTN (hypertension) 11/10/2012  . Pancreatitis 11/10/2012  . Abdominal pain 11/10/2012    [facility] ,[place], [place]  06/20/2018, 4:48 PM  [facility] [address] [place], [place], [address] Phone: [phone]   Fax:  [phone]   Name: Carlos Heber MRN: [id number] Date of Birth: Jan 02, 1961

## 2018-06-20 NOTE — Therapy (Signed)
[facility] [address] [place], [place], [address] Phone: [phone]   Fax:  [phone]  Occupational Therapy Treatment  Patient Details  Name: Hector Pugh MRN: [id number] Date of Birth: [birth year] Referring Provider: Dr. Delice Lesch   Encounter Date: 06/20/2018  OT End of Session - 06/20/18 1519    Visit Number  14    Number of Visits  17    Date for OT Re-Evaluation  06/30/18    Authorization Type  [employer]    OT Start Time  1450    OT Stop Time  1530    OT Time Calculation (min)  40 min    Activity Tolerance  Patient tolerated treatment well    Behavior During Therapy  [facility] for tasks assessed/performed       Past Medical History:  Diagnosis Date  . 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 Cancer ([place])   . Diabetes mellitus ([place]) 11/10/2012  . Diabetes mellitus without complication (HCC)    diet controlled  . HTN (hypertension) 11/10/2012  . Hypertension   . 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 Pancreatic cancer ([place])   . Pancreatitis 11/10/2012  . Seasonal allergies     Past Surgical History:  Procedure Laterality Date  . circucision    . circumsision    . COLONOSCOPY N/A 04/04/2018   Procedure: COLONOSCOPY;  Surgeon: Leighton Ruff, MD;  Location: [facility];  Service: Endoscopy;  Laterality: N/A;  . CRANIOTOMY Left 04/05/2018   Procedure: Left Temporal CRANIOTOMY HEMATOMA EVACUATION of Intracranial Hemorrhage;  Surgeon: Ashok Pall, MD;  Location: [facility];  Service: Neurosurgery;  Laterality: Left;  . ERCP  11/08/2012   Procedure: ENDOSCOPIC RETROGRADE CHOLANGIOPANCREATOGRAPHY (ERCP);  Surgeon: Jeryl Columbia, MD;  Location: [facility];  Service: Gastroenterology;  Laterality: N/A;  . EUS  11/08/2012   Procedure: UPPER ENDOSCOPIC ULTRASOUND (EUS) LINEAR;  Surgeon: Arta Silence, MD;  [facility];  Service: Endoscopy;  Laterality: N/A;  . FINE NEEDLE  ASPIRATION  11/08/2012   Procedure: FINE NEEDLE ASPIRATION (FNA) LINEAR;  Surgeon: Arta Silence, MD;  [facility];  Service: Endoscopy;  Laterality: N/A;  . HERNIA REPAIR     RIGHT  . LAPAROSCOPY  12/12/2012   Procedure: LAPAROSCOPY DIAGNOSTIC;  Surgeon: Stark Klein, MD;  [facility];  Service: General;  Laterality: N/A;  . Left Foot Surgery    . Left Foot Surgery    . VASECTOMY    . WHIPPLE PROCEDURE  12/12/2012   Procedure: WHIPPLE PROCEDURE;  Surgeon: Stark Klein, MD;  [facility];  Service: General;  Laterality: N/A;    There were no vitals filed for this visit.  Subjective Assessment - 06/20/18 1516    Subjective   Pt's dtr reports pt is going to see opthamologist for baeline vision testing     Patient Stated Goals  be independent    Currently in Pain?  No/denies                treatment: environmental scanning while tossing ball to alternate attention, 93% today, (significant improvement since last visit) Tabletop activities for alternating attention and executive function grossly 90% accuracy today. Symbol digits modalities test completed in 6 min 38 secs with 1 error. Discussed with pt and dtr adding more visits to address higher level vision and cognition as pt wants to drive and return to work.  OT Short Term Goals - 06/18/18 1453      OT SHORT TERM GOAL #1   Title  Independent with visual HEP     Status  Achieved      OT SHORT TERM GOAL #2   Title  Independent with putty HEP for Rt hand    Status  Achieved      OT SHORT TERM GOAL #3   Title  Pt/family to verbalize understanding with visual compensatory strategies for Rt visual field cut    Status  Achieved      OT SHORT TERM GOAL #4   Title  Pt to perform simple snack prep and light household chores with supervision only    Status  Achieved      OT SHORT TERM GOAL #5   Title  Pt to perform environmental scanning with 75% accuracy or greater    Status  Achieved  84% for basic        OT Long Term Goals - 06/18/18 1455      OT LONG TERM GOAL #1   Title  Pt to improve grip strength Rt dominant hand to 60 lbs or greater    Baseline  45 lbs    Time  8    Period  Weeks    Status  On-going      OT LONG TERM GOAL #2   Title  Pt to perform environmental scanning while performing simple physical task at 90% accuracy    Time  8    Period  Weeks    Status  On-going      OT LONG TERM GOAL #3   Title  Pt to return to cooking tasks with direct supervision only    Time  8    Period  Weeks    Status  On-going      OT LONG TERM GOAL #4   Title  Pt to return to home management tasks with direct supervision only    Time  8    Period  Weeks    Status  On-going            Plan - 06/20/18 1519    Clinical Impression Statement  Pt is progressing towards goals. He demonstrated improved environmental scanning today when dividing attention.    Occupational Profile and client history currently impacting functional performance  PMH: Ampullary adenacarcinoma, DM type 2, HTN, tachycardia, A-fib. 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[employer] Potential  Good    OT Frequency  2x / week    OT Duration  8 weeks    OT Treatment/Interventions  Self-care/ADL training;DME and/or AE instruction;Therapeutic activities;Therapeutic exercise;Cognitive remediation/compensation;Coping strategies training;Neuromuscular education;Functional Mobility Training;Passive range of motion;Visual/perceptual remediation/compensation;Manual Therapy;Patient/family education    Plan  continue to work towards unmet goals    Consulted and Agree with Plan of Care  Patient       Patient will benefit from skilled therapeutic intervention in order to improve the following deficits and impairments:  Decreased coordination, Improper body mechanics, Decreased safety awareness, Decreased endurance, Decreased activity tolerance, Decreased knowledge of  precautions, Impaired UE functional use, Decreased knowledge of use of DME, Decreased cognition, Decreased mobility, Decreased strength, Impaired perceived functional ability, Impaired vision/preception  Visit Diagnosis: Visuospatial deficit  Attention and concentration deficit    Problem List Patient Active Problem List   Diagnosis Date Noted  . DVT, lower extremity, distal, acute, right ([place]) 04/25/2018  . Acute  lower UTI   . Atelectasis   . Hypoalbuminemia due to protein-calorie malnutrition ([place])   . Left temporal lobe hemorrhage ([place]) 04/15/2018  . Vascular headache   . Seizure prophylaxis   . Cerebral edema (H[facility])   . Atrial fibrillation with rapid ventricular response ([place])   . Pneumonia   . Diabetes mellitus type 2 in nonobese (HCC)   . Benign essential HTN   . History of pancreatic cancer   . FUO (fever of unknown origin)   . Dysphagia, post-stroke   . Tachycardia   . Tachypnea   . Hypothyroidism   . Hypernatremia   . Hypokalemia   . Acute blood loss anemia   . Thrombocytopenia ([place])   . Intracranial hemorrhage ([place]) 04/05/2018  . Acute pancreatitis 06/11/2014  . Headache 06/11/2014  . 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 Ampullary carcinoma ([place]) 12/06/2012  . Obstructive jaundice 11/11/2012  . Biliary stricture 11/11/2012  . Diabetes mellitus ([place]) 11/10/2012  . HTN (hypertension) 11/10/2012  . Pancreatitis 11/10/2012  . Abdominal pain 11/10/2012    Hector Pugh 06/20/2018, 3:54 PM Theone Murdoch, OTR/L [phone] Phone: [phone] 4:01 PM 06/20/18 [facility] [address] [place], [place], [address] Phone: [phone]   Fax:  [phone]  Name: Hector Pugh MRN: [id number] Date of Birth: 1960-12-17

## 2018-06-20 NOTE — Patient Instructions (Signed)
  Please complete the assigned speech therapy homework prior to your next session and return it to the speech therapist at your next visit.  

## 2018-06-25 ENCOUNTER — Ambulatory Visit: Payer: Managed Care, Other (non HMO)

## 2018-06-25 ENCOUNTER — Ambulatory Visit: Payer: Managed Care, Other (non HMO) | Admitting: Occupational Therapy

## 2018-06-25 DIAGNOSIS — R414 Neurologic neglect syndrome: Secondary | ICD-10-CM | POA: Diagnosis not present

## 2018-06-25 DIAGNOSIS — R41841 Cognitive communication deficit: Secondary | ICD-10-CM

## 2018-06-25 DIAGNOSIS — R4701 Aphasia: Secondary | ICD-10-CM

## 2018-06-25 DIAGNOSIS — I69118 Other symptoms and signs involving cognitive functions following nontraumatic intracerebral hemorrhage: Secondary | ICD-10-CM

## 2018-06-25 DIAGNOSIS — R41842 Visuospatial deficit: Secondary | ICD-10-CM

## 2018-06-25 DIAGNOSIS — R41840 Attention and concentration deficit: Secondary | ICD-10-CM

## 2018-06-25 DIAGNOSIS — R278 Other lack of coordination: Secondary | ICD-10-CM

## 2018-06-25 NOTE — Therapy (Signed)
[facility] [address] [place], [place], [address] Phone: [phone]   Fax:  [phone]  Occupational Therapy Treatment  Patient Details  Name: Hector Pugh MRN: [id number] Date of Birth: [birth year] Referring Provider: Dr. Delice Lesch   Encounter Date: 06/25/2018  OT End of Session - 06/25/18 1536    Visit Number  15    Number of Visits  17    Date for OT Re-Evaluation  06/30/18    Authorization Type  [employer]    OT Start Time  1536    OT Stop Time  1615    OT Time Calculation (min)  39 min       Past Medical History:  Diagnosis Date  . 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 Cancer ([facility])   . Diabetes mellitus ([place]) 11/10/2012  . Diabetes mellitus without complication (HCC)    diet controlled  . HTN (hypertension) 11/10/2012  . Hypertension   . 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 Pancreatic cancer ([place])   . Pancreatitis 11/10/2012  . Seasonal allergies     Past Surgical History:  Procedure Laterality Date  . circucision    . circumsision    . COLONOSCOPY N/A 04/04/2018   Procedure: COLONOSCOPY;  Surgeon: Leighton Ruff, MD;  [facility];  Service: Endoscopy;  Laterality: N/A;  . CRANIOTOMY Left 04/05/2018   Procedure: Left Temporal CRANIOTOMY HEMATOMA EVACUATION of Intracranial Hemorrhage;  Surgeon: Ashok Pall, MD;  Location: [facility];  Service: Neurosurgery;  Laterality: Left;  . ERCP  11/08/2012   Procedure: ENDOSCOPIC RETROGRADE CHOLANGIOPANCREATOGRAPHY (ERCP);  Surgeon: Jeryl Columbia, MD;  Location: [facility];  Service: Gastroenterology;  Laterality: N/A;  . EUS  11/08/2012   Procedure: UPPER ENDOSCOPIC ULTRASOUND (EUS) LINEAR;  Surgeon: Arta Silence, MD;  [facility];  Service: Endoscopy;  Laterality: N/A;  . FINE NEEDLE ASPIRATION  11/08/2012   Procedure: FINE NEEDLE ASPIRATION (FNA) LINEAR;  Surgeon: Arta Silence, MD;  [facility];  Service: Endoscopy;  Laterality: N/A;  . HERNIA REPAIR     RIGHT  . LAPAROSCOPY  12/12/2012   Procedure: LAPAROSCOPY DIAGNOSTIC;  Surgeon: Stark Klein, MD;  [facility];  Service: General;  Laterality: N/A;  . Left Foot Surgery    . Left Foot Surgery    . VASECTOMY    . WHIPPLE PROCEDURE  12/12/2012   Procedure: WHIPPLE PROCEDURE;  Surgeon: Stark Klein, MD;  [facility];  Service: General;  Laterality: N/A;    There were no vitals filed for this visit.  Subjective Assessment - 06/25/18 1535    Subjective   Pt denies pain    Pertinent History  PMH: Ampullary Adenacarcinoma, DM2, HTN, tachycardia, A-fib    Limitations  no driving, 63/0 sup, no strenuous activity or lifting    Patient Stated Goals  be independent    Currently in Pain?  No/denies          Treatment: Environmental scanning in the open gym while tossing a ball, for alternating attention, with increased time pt located all items. Tabletop activities for alternating attention, visual scanning with  Accuracy. Pt performed clock math with 90% accuracy                    OT Short Term Goals - 06/18/18 1453      OT SHORT TERM GOAL #1   Title  Independent with visual HEP     Status  Achieved      OT SHORT TERM GOAL #2   Title  Independent with putty HEP for Rt hand    Status  Achieved      OT SHORT TERM GOAL #3   Title  Pt/family to verbalize understanding with visual compensatory strategies for Rt visual field cut    Status  Achieved      OT SHORT TERM GOAL #4   Title  Pt to perform simple snack prep and light household chores with supervision only    Status  Achieved      OT SHORT TERM GOAL #5   Title  Pt to perform environmental scanning with 75% accuracy or greater    Status  Achieved 84% for basic        OT Long Term Goals - 06/25/18 1537      OT LONG TERM GOAL #1   Title  Pt to improve grip strength Rt dominant hand to 60 lbs or greater    Status  Achieved 75  lbs      OT LONG TERM GOAL #2   Title  Pt to perform environmental scanning while performing simple physical task at 90% accuracy    Status  On-going      OT LONG TERM GOAL #3   Title  Pt to return to cooking tasks with direct supervision only      OT LONG TERM GOAL #4   Title  Pt to return to home management tasks with direct supervision only    Status  Achieved            Plan - 06/25/18 1606    Clinical Impression Statement  Pt demonstrates excellent overall progress. Pt reports performing simple home mangement with supervision at home.    Occupational Profile and client history currently impacting functional performance  PMH: Ampullary adenacarcinoma, DM type 2, HTN, tachycardia, A-fib. 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[employer]    Occupational performance deficits (Please refer to evaluation for details):  ADL's;IADL's;Work;Leisure;Social Participation    Rehab Potential  Good    Current Impairments/barriers affecting progress:  visual deficits, aphasia    OT Frequency  2x / week    OT Duration  8 weeks    OT Treatment/Interventions  Self-care/ADL training;DME and/or AE instruction;Therapeutic activities;Therapeutic exercise;Cognitive remediation/compensation;Coping strategies training;Neuromuscular education;Functional Mobility Training;Passive range of motion;Visual/perceptual remediation/compensation;Manual Therapy;Patient/family education    Plan  check goals and renew vs. d/c , pt has just recently reported he would like to return to work and driving eventually    Consulted and Agree with Plan of Care  Patient       Patient will benefit from skilled therapeutic intervention in order to improve the following deficits and impairments:  Decreased coordination, Improper body mechanics, Decreased safety awareness, Decreased endurance, Decreased activity tolerance, Decreased knowledge of precautions, Impaired UE functional use, Decreased knowledge of  use of DME, Decreased cognition, Decreased mobility, Decreased strength, Impaired perceived functional ability, Impaired vision/preception  Visit Diagnosis: Visuospatial deficit  Attention and concentration deficit  Other symptoms and signs involving 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 DVT, lower extremity, distal, acute, right ([place]) 04/25/2018  . Acute lower UTI   . Atelectasis   . Hypoalbuminemia due to protein-calorie malnutrition ([place])   . Left temporal lobe hemorrhage ([place]) 04/15/2018  . Vascular headache   .  Seizure prophylaxis   . Cerebral edema ([facility])   . Atrial fibrillation with rapid ventricular response ([place])   . Pneumonia   . Diabetes mellitus type 2 in nonobese (HCC)   . Benign essential HTN   . History of pancreatic cancer   . FUO (fever of unknown origin)   . Dysphagia, post-stroke   . Tachycardia   . Tachypnea   . Hypothyroidism   . Hypernatremia   . Hypokalemia   . Acute blood loss anemia   . Thrombocytopenia ([place])   . Intracranial hemorrhage ([place]) 04/05/2018  . Acute pancreatitis 06/11/2014  . Headache 06/11/2014  . 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 Ampullary carcinoma ([place]) 12/06/2012  . Obstructive jaundice 11/11/2012  . Biliary stricture 11/11/2012  . Diabetes mellitus ([place]) 11/10/2012  . HTN (hypertension) 11/10/2012  . Pancreatitis 11/10/2012  . Abdominal pain 11/10/2012    Shantara Goosby 06/25/2018, 4:09 PM .[facility] [address] [place], [place], [address] Phone: [phone]   Fax:  [phone]  Name: Tyler Cubit MRN: [id number] Date of Birth: 12-May-1961

## 2018-06-25 NOTE — Patient Instructions (Signed)
  Please complete the assigned speech therapy homework prior to your next session and return it to the speech therapist at your next visit.  

## 2018-06-25 NOTE — Therapy (Signed)
[facility] [address] [place], [place], [address] Phone: [phone]   Fax:  [phone]  Speech Language Pathology Treatment  Patient Details  Name: Hector Pugh MRN: [id number] Date of Birth: 1960-12-13 Referring Provider: Delice Lesch, MD   Encounter Date: 06/25/2018  End of Session - 06/25/18 1504    Visit Number  10    Number of Visits  25    Date for SLP Re-Evaluation  08/01/18    Authorization Time Period  05-10-18 to 11-09-18    Authorization - Visit Number  10    Authorization - Number of Visits  25    SLP Start Time  1829    SLP Stop Time   9371    SLP Time Calculation (min)  43 min    Activity Tolerance  Patient tolerated treatment well       Past Medical History:  Diagnosis Date  . 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 Cancer ([place])   . Diabetes mellitus ([place]) 11/10/2012  . Diabetes mellitus without complication (HCC)    diet controlled  . HTN (hypertension) 11/10/2012  . Hypertension   . 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 Pancreatic cancer ([place])   . Pancreatitis 11/10/2012  . Seasonal allergies     Past Surgical History:  Procedure Laterality Date  . circucision    . circumsision    . COLONOSCOPY N/A 04/04/2018   Procedure: COLONOSCOPY;  Surgeon: Leighton Ruff, MD;  [facility];  Service: Endoscopy;  Laterality: N/A;  . CRANIOTOMY Left 04/05/2018   Procedure: Left Temporal CRANIOTOMY HEMATOMA EVACUATION of Intracranial Hemorrhage;  Surgeon: Ashok Pall, MD;  Location: [facility];  Service: Neurosurgery;  Laterality: Left;  . ERCP  11/08/2012   Procedure: ENDOSCOPIC RETROGRADE CHOLANGIOPANCREATOGRAPHY (ERCP);  Surgeon: Jeryl Columbia, MD;  Location: [facility];  Service: Gastroenterology;  Laterality: N/A;  . EUS  11/08/2012   Procedure: UPPER ENDOSCOPIC ULTRASOUND (EUS) LINEAR;  Surgeon: Arta Silence, MD;  [facility];  Service:  Endoscopy;  Laterality: N/A;  . FINE NEEDLE ASPIRATION  11/08/2012   Procedure: FINE NEEDLE ASPIRATION (FNA) LINEAR;  Surgeon: Arta Silence, MD;  [facility];  Service: Endoscopy;  Laterality: N/A;  . HERNIA REPAIR     RIGHT  . LAPAROSCOPY  12/12/2012   Procedure: LAPAROSCOPY DIAGNOSTIC;  Surgeon: Stark Klein, MD;  [facility];  Service: General;  Laterality: N/A;  . Left Foot Surgery    . Left Foot Surgery    . VASECTOMY    . WHIPPLE PROCEDURE  12/12/2012   Procedure: WHIPPLE PROCEDURE;  Surgeon: Stark Klein, MD;  [facility];  Service: General;  Laterality: N/A;    There were no vitals filed for this visit.  Subjective Assessment - 06/25/18 1408    Subjective  Pt did not bring notebook. "I didn't bring anything."    Currently in Pain?  No/denies            ADULT SLP TREATMENT - 06/25/18 1409      General Information   Behavior/Cognition  Alert;Cooperative;Pleasant mood      Treatment Provided   Treatment provided  Cognitive-Linquistic      Cognitive-Linquistic Treatment   Treatment focused on  Cognition    Skilled Treatment  SLP reviewed last session with pt - reduced sustained and selective attention - and told pt how this deficit would affect work tasks. 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 Pt functionally explained with min SLP question cues the difficulty with his receptive aphasia as part of the difficulty he has with cognitive tasks. SLP agreed with pt and stated that doing cognitive linguistic tasks without heavy use/need of language has been the focus of the last two sessions with this SLP, and that pt still has difficulty with organization and attention as well as memory in these tasks. In simple organization/problem solving tasks SLP had to cue pt to write down information pertinent to solving the problem but when pt did so he produced an answer much more quickly than if he didn't write anything down. SLP shared  this with pt and he agreed that writing down information makes it easier to return to and be able to stay more focused on solving the problem rather than trying to attend to or concentrate on the details he did not have to attend to before.        Assessment / Recommendations / Plan   Plan  Continue with current plan of care      Progression Toward Goals   Progression toward goals  Progressing toward goals       SLP Education - 06/25/18 1504    Education Details  aphasia - interaction with cognitive-linguistic tasks, cognitive deficit areas    Person(s) Educated  Patient    Methods  Explanation    Comprehension  Verbalized understanding;Need further instruction       SLP Short Term Goals - 06/20/18 1643      SLP SHORT TERM GOAL #1   Title  pt will generate sentence responses in min-mod complex language tasks 80% accuracy with min-mod A occasionally for error awareness    Period  -- ot 9 total sessions, for all STGs    Status  Not Met      SLP SHORT TERM GOAL #2   Title  pt will read sentences of 8-9 words with error awareness at 90% over three sessions    Status  Not Met      SLP SHORT TERM GOAL #3   Title  pt will demo understanding of spoken sentence level stimuli with 80% success and occasional min A over two sessions    Status  Partially Met      SLP SHORT TERM GOAL #4   Title  pt will complete cognitive linguistic testing within first 4 therapy sessions    Period  -- or 5 total sessions    Status  Achieved      SLP SHORT TERM GOAL #5   Title  pt will complete a HEP for increasing accuracy of his verbal output with 90% success with rare min A for error awareness    Status  Not Met      SLP SHORT TERM GOAL #6   Title  Pt will selectively attend to structured cognitive lingustic tasks in mildly distracting environment with 85% on task and occasional min A for attention over 2 sessions    Time  3    Period  Weeks    Status  On-going       SLP Long Term Goals -  06/25/18 1506      SLP LONG TERM GOAL #1   Title  pt will complete a HEP for improving accuracy of his verbal output with 90% success over three sessions    Time  3    Period  Weeks or 17 sessions    Status  Deferred to  work on Neurosurgeon      [place] #2   Title  pt will participate in 6 minutes of simple-mod complex conversation appropriately, with occasional min A    Time  3    Period  Weeks or 17 sessions    Status  Revised      SLP LONG TERM GOAL #3   Title  pt will demo understanding of 8 minutes simple-mod complex conversation over three visits, with occasional min A    Time  4    Period  Weeks or 17 visits    Status  Revised      SLP LONG TERM GOAL #4   Title  pt will demo understanding of 10 minutes mod complex conversation over three visits, with modified independence    Time  8    Period  Weeks or 25 total visits    Status  Revised      SLP LONG TERM GOAL #5   Title  pt will demo awareness of verbal speech errors by correcting 95% of errors in 10 minutes mod complex conversation over two sessions    Time  8    Period  Weeks or 25 visits    Status  Revised      SLP LONG TERM GOAL #6   Title  Pt will alternate attention between 2 simple cognitive linguistic tasks with 80% on each and occasional min A over 2 sessions    Time  8    Period  Weeks    Status  On-going      SLP LONG TERM GOAL #7   Title  Pt will utilize external aids for memory, schedule management, med management with occasional min A from family/caregivers over 2 sessions    Time  8    Period  Weeks    Status  On-going       Plan - 06/25/18 1505    Clinical Impression Statement  Hector Pugh cont to present today with 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 Pt explained mod complex topic to SLP today with min SLP questioning cues and cues for clarification. Pt not always able to elaborate. Extra time and SLP cues were again needed for cognitive  linguistic tasks - see "skilled intervention" for details. It was learned today pt desires to return to work. He would cont to benefit from skilled ST targeting langauge skills (recpetive and expressive), and from cognitive linguistic thearpy in order to maximize langauge and cognitive skills.    Speech Therapy Frequency  2x / week    Duration  -- 12 weeks or 25 total visits    Treatment/Interventions  Internal/external aids;Patient/family education;Compensatory strategies;SLP instruction and feedback;Functional tasks;Cognitive reorganization;Cueing hierarchy;Environmental controls;Language facilitation    Potential to Achieve Goals  Good    Potential Considerations  Ability to learn/carryover information       Patient will benefit from skilled therapeutic intervention in order to improve the following deficits and impairments:   Cognitive communication deficit  Aphasia    Problem List Patient Active Problem List   Diagnosis Date Noted  . DVT, lower extremity, distal, acute, right ([place]) 04/25/2018  . Acute lower UTI   . Atelectasis   . Hypoalbuminemia due to protein-calorie malnutrition ([place])   . Left temporal lobe hemorrhage ([place]) 04/15/2018  . Vascular headache   . Seizure prophylaxis   . Cerebral edema (H[facility])   . Atrial fibrillation with rapid ventricular response ([place])   . Pneumonia   .  Diabetes mellitus type 2 in nonobese (HCC)   . Benign essential HTN   . History of pancreatic cancer   . FUO (fever of unknown origin)   . Dysphagia, post-stroke   . Tachycardia   . Tachypnea   . Hypothyroidism   . Hypernatremia   . Hypokalemia   . Acute blood loss anemia   . Thrombocytopenia ([place])   . Intracranial hemorrhage ([place]) 04/05/2018  . Acute pancreatitis 06/11/2014  . Headache 06/11/2014  . 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 Obstructive jaundice 11/11/2012  . Biliary stricture 11/11/2012  . Diabetes mellitus ([place])  11/10/2012  . HTN (hypertension) 11/10/2012  . Pancreatitis 11/10/2012  . Abdominal pain 11/10/2012    [facility] ,[place], CCC-SLP' 06/25/2018, 3:07 PM  [facility] [address] [place], [place], [address] Phone: [phone]   Fax:  [phone]   Name: Rashard Ryle MRN: [id number] Date of Birth: 1961-04-14

## 2018-06-27 ENCOUNTER — Ambulatory Visit: Payer: Managed Care, Other (non HMO) | Admitting: Occupational Therapy

## 2018-06-27 ENCOUNTER — Ambulatory Visit: Payer: Managed Care, Other (non HMO)

## 2018-06-27 DIAGNOSIS — R41841 Cognitive communication deficit: Secondary | ICD-10-CM

## 2018-06-27 DIAGNOSIS — R4701 Aphasia: Secondary | ICD-10-CM

## 2018-06-27 DIAGNOSIS — R41840 Attention and concentration deficit: Secondary | ICD-10-CM

## 2018-06-27 DIAGNOSIS — R41842 Visuospatial deficit: Secondary | ICD-10-CM

## 2018-06-27 DIAGNOSIS — R414 Neurologic neglect syndrome: Secondary | ICD-10-CM | POA: Diagnosis not present

## 2018-06-27 NOTE — Patient Instructions (Signed)
  Please complete the assigned speech therapy homework prior to your next session and return it to the speech therapist at your next visit.  

## 2018-06-27 NOTE — Therapy (Signed)
[facility] [address] [place], [place], [address] Phone: [phone]   Fax:  [phone]  Speech Language Pathology Treatment  Patient Details  Name: Hector Pugh MRN: [id number] Date of Birth: October 25, 1961 Referring Provider: Delice Lesch, MD   Encounter Date: 06/27/2018  End of Session - 06/27/18 1651    Visit Number  11    Number of Visits  25    Date for SLP Re-Evaluation  08/01/18    Authorization Type  pre-auth - [employer]    Authorization Time Period  05-10-18 to 11-09-18    Authorization - Visit Number  11    Authorization - Number of Visits  25    SLP Start Time  1532    SLP Stop Time   1615    SLP Time Calculation (min)  43 min    Activity Tolerance  Patient tolerated treatment well       Past Medical History:  Diagnosis Date  . 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 Cancer ([place])   . Diabetes mellitus ([place]) 11/10/2012  . Diabetes mellitus without complication (HCC)    diet controlled  . HTN (hypertension) 11/10/2012  . Hypertension   . 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 Pancreatic cancer ([place])   . Pancreatitis 11/10/2012  . Seasonal allergies     Past Surgical History:  Procedure Laterality Date  . circucision    . circumsision    . COLONOSCOPY N/A 04/04/2018   Procedure: COLONOSCOPY;  Surgeon: Leighton Ruff, MD;  [facility];  Service: Endoscopy;  Laterality: N/A;  . CRANIOTOMY Left 04/05/2018   Procedure: Left Temporal CRANIOTOMY HEMATOMA EVACUATION of Intracranial Hemorrhage;  Surgeon: Ashok Pall, MD;  Location: [facility];  Service: Neurosurgery;  Laterality: Left;  . ERCP  11/08/2012   Procedure: ENDOSCOPIC RETROGRADE CHOLANGIOPANCREATOGRAPHY (ERCP);  Surgeon: Jeryl Columbia, MD;  Location[facility];  Service: Gastroenterology;  Laterality: N/A;  . EUS  11/08/2012   Procedure: UPPER ENDOSCOPIC ULTRASOUND (EUS) LINEAR;  Surgeon: Arta Silence,  MD;  [facility];  Service: Endoscopy;  Laterality: N/A;  . FINE NEEDLE ASPIRATION  11/08/2012   Procedure: FINE NEEDLE ASPIRATION (FNA) LINEAR;  Surgeon: Arta Silence, MD;  [facility];  Service: Endoscopy;  Laterality: N/A;  . HERNIA REPAIR     RIGHT  . LAPAROSCOPY  12/12/2012   Procedure: LAPAROSCOPY DIAGNOSTIC;  Surgeon: Stark Klein, MD;  [facility];  Service: General;  Laterality: N/A;  . Left Foot Surgery    . Left Foot Surgery    . VASECTOMY    . WHIPPLE PROCEDURE  12/12/2012   Procedure: WHIPPLE PROCEDURE;  Surgeon: Stark Klein, MD;  [facility];  Service: General;  Laterality: N/A;    There were no vitals filed for this visit.         ADULT SLP TREATMENT - 06/27/18 1647      General Information   Behavior/Cognition  Alert;Cooperative;Pleasant mood      Treatment Provided   Treatment provided  Cognitive-Linquistic      Cognitive-Linquistic Treatment   Treatment focused on  Aphasia    Skilled Treatment  Pt arrived today with a speech to present at his son's wedding in two weeks. SLP practiced with pt, requiring consistent mod cues for awareness of aphasic errors ("entirely"/entire, etc). SLP also used visual cues for pt written on the paper for "heart" as pt stated he was able to read this word  at times but not at others. Given pt's cognitive defiicts SLP attempted to space out written lines and minimize competing visual stimulation.       Assessment / Recommendations / Plan   Plan  Continue with current plan of care      Progression Toward Goals   Progression toward goals  Progressing toward goals         SLP Short Term Goals - 06/20/18 1643      SLP SHORT TERM GOAL #1   Title  pt will generate sentence responses in min-mod complex language tasks 80% accuracy with min-mod A occasionally for error awareness    Period  -- ot 9 total sessions, for all STGs    Status  Not Met      SLP SHORT TERM GOAL #2   Title  pt will read  sentences of 8-9 words with error awareness at 90% over three sessions    Status  Not Met      SLP SHORT TERM GOAL #3   Title  pt will demo understanding of spoken sentence level stimuli with 80% success and occasional min A over two sessions    Status  Partially Met      SLP SHORT TERM GOAL #4   Title  pt will complete cognitive linguistic testing within first 4 therapy sessions    Period  -- or 5 total sessions    Status  Achieved      SLP SHORT TERM GOAL #5   Title  pt will complete a HEP for increasing accuracy of his verbal output with 90% success with rare min A for error awareness    Status  Not Met      SLP SHORT TERM GOAL #6   Title  Pt will selectively attend to structured cognitive lingustic tasks in mildly distracting environment with 85% on task and occasional min A for attention over 2 sessions    Time  3    Period  Weeks    Status  On-going       SLP Long Term Goals - 06/27/18 1655      SLP LONG TERM GOAL #1   Title  pt will complete a HEP for improving accuracy of his verbal output with 90% success over three sessions    Time  3    Period  Weeks or 17 sessions    Status  Deferred to work on cognitive linguistics      SLP LONG TERM GOAL #2   Title  pt will participate in 6 minutes of simple-mod complex conversation appropriately, with occasional min A    Time  3    Period  Weeks or 17 sessions    Status  On-going      SLP LONG TERM GOAL #3   Title  pt will demo understanding of 8 minutes simple-mod complex conversation over three visits, with occasional min A    Time  4    Period  Weeks or 17 visits    Status  On-going      SLP LONG TERM GOAL #4   Title  pt will demo understanding of 10 minutes mod complex conversation over three visits, with modified independence    Time  8    Period  Weeks or 25 total visits    Status  On-going      SLP LONG TERM GOAL #5   Title  pt will demo awareness of verbal speech errors by correcting 95% of errors in  10  minutes mod complex conversation over two sessions    Time  8    Period  Weeks or 25 visits    Status  On-going      SLP LONG TERM GOAL #6   Title  Pt will alternate attention between 2 simple cognitive linguistic tasks with 80% on each and occasional min A over 2 sessions    Time  8    Period  Weeks    Status  On-going      SLP LONG TERM GOAL #7   Title  Pt will utilize external aids for memory, schedule management, med management with occasional min A from family/caregivers over 2 sessions    Time  8    Period  Weeks    Status  On-going       Plan - 06/27/18 1651    Clinical Impression Statement  Mr. Maffeo cont to present today with 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 Today, SLP worked with pt's expressive aphasia and his awareness of aphasic errors. Mod cues were consistently needed for awareness. He would cont to benefit from skilled ST targeting langauge skills (recpetive and expressive), and from cognitive linguistic thearpy in order to maximize langauge and cognitive skills.    Speech Therapy Frequency  2x / week    Duration  1 week 12 weeks/25 total visits    Treatment/Interventions  Internal/external aids;Patient/family education;Compensatory strategies;SLP instruction and feedback;Functional tasks;Cognitive reorganization;Cueing hierarchy;Environmental controls;Language facilitation    Potential to Achieve Goals  Good    Potential Considerations  Ability to learn/carryover information       Patient will benefit from skilled therapeutic intervention in order to improve the following deficits and impairments:   Aphasia  Cognitive communication deficit    Problem List Patient Active Problem List   Diagnosis Date Noted  . DVT, lower extremity, distal, acute, right ([place]) 04/25/2018  . Acute lower UTI   . Atelectasis   . Hypoalbuminemia due to protein-calorie malnutrition ([place])   . Left temporal lobe hemorrhage ([place]) 04/15/2018  .  Vascular headache   . Seizure prophylaxis   . Cerebral edema ([facility])   . Atrial fibrillation with rapid ventricular response ([place])   . Pneumonia   . Diabetes mellitus type 2 in nonobese ([facility])   . Benign essential HTN   . History of pancreatic cancer   . FUO (fever of unknown origin)   . Dysphagia, post-stroke   . Tachycardia   . Tachypnea   . Hypothyroidism   . Hypernatremia   . Hypokalemia   . Acute blood loss anemia   . Thrombocytopenia ([place])   . Intracranial hemorrhage ([place]) 04/05/2018  . Acute pancreatitis 06/11/2014  . Headache 06/11/2014  . 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 Obstructive jaundice 11/11/2012  . Biliary stricture 11/11/2012  . Diabetes mellitus ([place]) 11/10/2012  . HTN (hypertension) 11/10/2012  . Pancreatitis 11/10/2012  . Abdominal pain 11/10/2012    [facility] ,[place], CCC-SLP  06/27/2018, 4:56 PM  [facility] [address] [place], [place], [address] Phone: [phone]   Fax:  [phone]   Name: Victorio Creeden MRN: [id number] Date of Birth: 04-Jun-1961

## 2018-06-27 NOTE — Therapy (Signed)
[facility] [address] [place], [place], [address] Phone: [phone]   Fax:  [phone]  Occupational Therapy Treatment  Patient Details  Name: Hector Pugh MRN: [id number] Date of Birth: January 06, 1961 Referring Provider: Dr. Delice Lesch   Encounter Date: 06/27/2018  OT End of Session - 06/27/18 1648    Visit Number  16    Number of Visits  17    Date for OT Re-Evaluation  06/30/18    Authorization Type  [employer]    OT Start Time  1445    OT Stop Time  1535    OT Time Calculation (min)  50 min    Activity Tolerance  Patient tolerated treatment well    Behavior During Therapy  [facility] for tasks assessed/performed       Past Medical History:  Diagnosis Date  . 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 Cancer ([place])   . Diabetes mellitus ([place]) 11/10/2012  . Diabetes mellitus without complication (HCC)    diet controlled  . HTN (hypertension) 11/10/2012  . Hypertension   . 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 Pancreatic cancer ([place])   . Pancreatitis 11/10/2012  . Seasonal allergies     Past Surgical History:  Procedure Laterality Date  . circucision    . circumsision    . COLONOSCOPY N/A 04/04/2018   Procedure: COLONOSCOPY;  Surgeon: Leighton Ruff, MD;  [facility];  Service: Endoscopy;  Laterality: N/A;  . CRANIOTOMY Left 04/05/2018   Procedure: Left Temporal CRANIOTOMY HEMATOMA EVACUATION of Intracranial Hemorrhage;  Surgeon: Ashok Pall, MD;  Location: [facility];  Service: Neurosurgery;  Laterality: Left;  . ERCP  11/08/2012   Procedure: ENDOSCOPIC RETROGRADE CHOLANGIOPANCREATOGRAPHY (ERCP);  Surgeon: Jeryl Columbia, MD;  Location: [facility];  Service: Gastroenterology;  Laterality: N/A;  . EUS  11/08/2012   Procedure: UPPER ENDOSCOPIC ULTRASOUND (EUS) LINEAR;  Surgeon: Arta Silence, MD;  [facility];  Service: Endoscopy;  Laterality: N/A;  . FINE NEEDLE  ASPIRATION  11/08/2012   Procedure: FINE NEEDLE ASPIRATION (FNA) LINEAR;  Surgeon: Arta Silence, MD;  [facility];  Service: Endoscopy;  Laterality: N/A;  . HERNIA REPAIR     RIGHT  . LAPAROSCOPY  12/12/2012   Procedure: LAPAROSCOPY DIAGNOSTIC;  Surgeon: Stark Klein, MD;  [facility];  Service: General;  Laterality: N/A;  . Left Foot Surgery    . Left Foot Surgery    . VASECTOMY    . WHIPPLE PROCEDURE  12/12/2012   Procedure: WHIPPLE PROCEDURE;  Surgeon: Stark Klein, MD;  [facility];  Service: General;  Laterality: N/A;    There were no vitals filed for this visit.  Subjective Assessment - 06/27/18 1453    Subjective   Thanks for all your help    Pertinent History  PMH: Ampullary Adenacarcinoma, DM2, HTN, tachycardia, A-fib    Limitations  no driving, 35/8 sup, no strenuous activity or lifting    Patient Stated Goals  be independent    Currently in Pain?  No/denies       Tabletop activities: pt matching shapes with increased time, missing object on Rt side, and occasional cueing due to decr. Attention and visual/perceptual deficits. Matching analogue and clock times w/ slower speed Rt side, scanning over 1 time d/t decr. Attention/impulsivity.  Crossing out the #'s that repeat 4x with 100% accuracy.   Environmental scanning at 100% accuracy while tossing ball.   Discussed with pt/daughter re:  renewal vs. D/c. Pt has met all O.T. goals and just needs reinforcement for visual compensatory strategies at this time, and to re-check himself d/t decr attention. Pt's main deficit at this time is aphasia.                       OT Short Term Goals - 06/18/18 1453      OT SHORT TERM GOAL #1   Title  Independent with visual HEP     Status  Achieved      OT SHORT TERM GOAL #2   Title  Independent with putty HEP for Rt hand    Status  Achieved      OT SHORT TERM GOAL #3   Title  Pt/family to verbalize understanding with visual compensatory  strategies for Rt visual field cut    Status  Achieved      OT SHORT TERM GOAL #4   Title  Pt to perform simple snack prep and light household chores with supervision only    Status  Achieved      OT SHORT TERM GOAL #5   Title  Pt to perform environmental scanning with 75% accuracy or greater    Status  Achieved 84% for basic        OT Long Term Goals - 06/27/18 1649      OT LONG TERM GOAL #1   Title  Pt to improve grip strength Rt dominant hand to 60 lbs or greater    Status  Achieved 75 lbs      OT LONG TERM GOAL #2   Title  Pt to perform environmental scanning while performing simple physical task at 90% accuracy    Status  Achieved 100% in therapy gym      OT LONG TERM GOAL #3   Title  Pt to return to cooking tasks with direct supervision only    Status  Achieved      OT LONG TERM GOAL #4   Title  Pt to return to home management tasks with direct supervision only    Status  Achieved            Plan - 06/27/18 1649    Clinical Impression Statement  Pt has met all LTG's. Pt occasionally continues to miss things on Rt side d/t decreased attention and carryover of visual compensatory strategies (d/t field cut). Pt however has improved significantly and main area of concern now is aphasia     Occupational Profile and client history currently impacting functional performance  PMH: Ampullary adenacarcinoma, DM type 2, HTN, tachycardia, A-fib. 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[employer]    Occupational performance deficits (Please refer to evaluation for details):  ADL's;IADL's;Work;Leisure;Social Participation    Rehab Potential  Good    Current Impairments/barriers affecting progress:  visual deficits, aphasia    OT Treatment/Interventions  Self-care/ADL training;DME and/or AE instruction;Therapeutic activities;Therapeutic exercise;Cognitive remediation/compensation;Coping strategies training;Neuromuscular education;Functional Mobility  Training;Passive range of motion;Visual/perceptual remediation/compensation;Manual Therapy;Patient/family education    Plan  D/C O.T. per discussion and agreement with patient/daughter. Pt is not safe to drive at this time, and both pt and daughter agree with this. Pt will see neuro-opthamalogist in near future however daughter is waiting for aphasia to get better for accurate assessment    Consulted and Agree with Plan of Care  Patient    Family Member Consulted  daughter       Patient will benefit from  skilled therapeutic intervention in order to improve the following deficits and impairments:  Decreased coordination, Improper body mechanics, Decreased safety awareness, Decreased endurance, Decreased activity tolerance, Decreased knowledge of precautions, Impaired UE functional use, Decreased knowledge of use of DME, Decreased cognition, Decreased mobility, Decreased strength, Impaired perceived functional ability, Impaired vision/preception  Visit Diagnosis: Visuospatial deficit  Attention and concentration deficit    Problem List Patient Active Problem List   Diagnosis Date Noted  . DVT, lower extremity, distal, acute, right ([facility]) 04/25/2018  . Acute lower UTI   . Atelectasis   . Hypoalbuminemia due to protein-calorie malnutrition ([place])   . Left temporal lobe hemorrhage ([place]) 04/15/2018  . Vascular headache   . Seizure prophylaxis   . Cerebral edema (HCC)   . Atrial fibrillation with rapid ventricular response ([place])   . Pneumonia   . Diabetes mellitus type 2 in nonobese (HCC)   . Benign essential HTN   . History of pancreatic cancer   . FUO (fever of unknown origin)   . Dysphagia, post-stroke   . Tachycardia   . Tachypnea   . Hypothyroidism   . Hypernatremia   . Hypokalemia   . Acute blood loss anemia   . Thrombocytopenia ([place])   . Intracranial hemorrhage ([place]) 04/05/2018  . Acute pancreatitis 06/11/2014  . Headache 06/11/2014  . Exocrine pancreatic insufficiency  03/11/2013  . Ampullary carcinoma ([place]) 12/06/2012  . Obstructive jaundice 11/11/2012  . Biliary stricture 11/11/2012  . Diabetes mellitus ([place]) 11/10/2012  . HTN (hypertension) 11/10/2012  . Pancreatitis 11/10/2012  . Abdominal pain 11/10/2012    OCCUPATIONAL THERAPY DISCHARGE SUMMARY  Visits from Start of Care: 16  Current functional level related to goals / functional outcomes: See above for details   Remaining deficits: Rt visual field cut Decreased attention to Rt side and visual attention generally Aphasia (pt is still seeing Speech therapy for this)    Education / Equipment: Visual HEP, Visual compensatory strategies, putty HEP  Plan: Patient agrees to discharge.  Patient goals were met. Patient is being discharged due to meeting the stated rehab goals.  (See above "plan"?????       Carey Bullocks, OTR/L 06/27/2018, 4:53 PM  [facility] [address] [place], [place], [address] Phone: [phone]   Fax:  [phone]  Name: Bufford Helms MRN: [id number] Date of Birth: 04/16/1961

## 2018-07-09 ENCOUNTER — Ambulatory Visit: Payer: Managed Care, Other (non HMO) | Attending: Internal Medicine

## 2018-07-09 ENCOUNTER — Encounter: Payer: Managed Care, Other (non HMO) | Admitting: Occupational Therapy

## 2018-07-09 DIAGNOSIS — R41841 Cognitive communication deficit: Secondary | ICD-10-CM | POA: Diagnosis present

## 2018-07-09 DIAGNOSIS — R4701 Aphasia: Secondary | ICD-10-CM | POA: Diagnosis present

## 2018-07-09 NOTE — Therapy (Signed)
[facility] [address] [place], [place], [address] Phone: [phone]   Fax:  [phone]  Speech Language Pathology Treatment  Patient Details  Name: Hector Pugh MRN: [id number] Date of Birth: 12-11-1960 Referring Provider: Delice Lesch, MD   Encounter Date: 07/09/2018  End of Session - 07/09/18 1014    Visit Number  12    Number of Visits  25    Date for SLP Re-Evaluation  08/01/18    Authorization Type  pre-auth - [employer]    Authorization Time Period  05-10-18 to 11-09-18    Authorization - Visit Number  12    Authorization - Number of Visits  25    SLP Start Time  [phone]    SLP Stop Time   1017    SLP Time Calculation (min)  40 min    Activity Tolerance  Patient tolerated treatment well       Past Medical History:  Diagnosis Date  . 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 Cancer ([place])   . Diabetes mellitus ([facility]) 11/10/2012  . Diabetes mellitus without complication (HCC)    diet controlled  . HTN (hypertension) 11/10/2012  . Hypertension   . 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 Pancreatic cancer ([facility])   . Pancreatitis 11/10/2012  . Seasonal allergies     Past Surgical History:  Procedure Laterality Date  . circucision    . circumsision    . COLONOSCOPY N/A 04/04/2018   Procedure: COLONOSCOPY;  Surgeon: Leighton Ruff, MD;  [facility];  Service: Endoscopy;  Laterality: N/A;  . CRANIOTOMY Left 04/05/2018   Procedure: Left Temporal CRANIOTOMY HEMATOMA EVACUATION of Intracranial Hemorrhage;  Surgeon: Ashok Pall, MD;  Location: [facility];  Service: Neurosurgery;  Laterality: Left;  . ERCP  11/08/2012   Procedure: ENDOSCOPIC RETROGRADE CHOLANGIOPANCREATOGRAPHY (ERCP);  Surgeon: Jeryl Columbia, MD;  [facility];  Service: Gastroenterology;  Laterality: N/A;  . EUS  11/08/2012   Procedure: UPPER ENDOSCOPIC ULTRASOUND (EUS) LINEAR;  Surgeon: Arta Silence, MD;   [facility];  Service: Endoscopy;  Laterality: N/A;  . FINE NEEDLE ASPIRATION  11/08/2012   Procedure: FINE NEEDLE ASPIRATION (FNA) LINEAR;  Surgeon: Arta Silence, MD;  [facility];  Service: Endoscopy;  Laterality: N/A;  . HERNIA REPAIR     RIGHT  . LAPAROSCOPY  12/12/2012   Procedure: LAPAROSCOPY DIAGNOSTIC;  Surgeon: Stark Klein, MD;  [facility];  Service: General;  Laterality: N/A;  . Left Foot Surgery    . Left Foot Surgery    . VASECTOMY    . WHIPPLE PROCEDURE  12/12/2012   Procedure: WHIPPLE PROCEDURE;  Surgeon: Stark Klein, MD;  [facility];  Service: General;  Laterality: N/A;    There were no vitals filed for this visit.  Subjective Assessment - 07/09/18 0940    Subjective  "The speech went good."    Currently in Pain?  No/denies            ADULT SLP TREATMENT - 07/09/18 0941      General Information   Behavior/Cognition  Alert;Cooperative;Pleasant mood      Treatment Provided   Treatment provided  Cognitive-Linquistic      Cognitive-Linquistic Treatment   Treatment focused on  Cognition    Skilled Treatment  SLP provided pt a task requiring alternating and selective attention, including attention to detail (work hours). Pt without double checking for errors. Alternating attention appeared [facility] in  this task (as did pt's homework with mod complex alternating attention task). SLP's encouragement for pt to double check his answers after errors on the first item were taken into consideration as pt double checked answers for remaining answers. However attention to details required cues as pt did not complete a section due to inattention. Extra time noted consistently with these simple but detailed math tasks.       Assessment / Recommendations / Plan   Plan  Continue with current plan of care      Progression Toward Goals   Progression toward goals  Progressing toward goals       SLP Education - 07/09/18 1014    Education Details   need to double check answers    Person(s) Educated  Patient    Methods  Explanation    Comprehension  Need further instruction;Verbalized understanding;Returned demonstration       SLP Short Term Goals - 06/20/18 1643      SLP SHORT TERM GOAL #1   Title  pt will generate sentence responses in min-mod complex language tasks 80% accuracy with min-mod A occasionally for error awareness    Period  -- ot 9 total sessions, for all STGs    Status  Not Met      SLP SHORT TERM GOAL #2   Title  pt will read sentences of 8-9 words with error awareness at 90% over three sessions    Status  Not Met      SLP SHORT TERM GOAL #3   Title  pt will demo understanding of spoken sentence level stimuli with 80% success and occasional min A over two sessions    Status  Partially Met      SLP SHORT TERM GOAL #4   Title  pt will complete cognitive linguistic testing within first 4 therapy sessions    Period  -- or 5 total sessions    Status  Achieved      SLP SHORT TERM GOAL #5   Title  pt will complete a HEP for increasing accuracy of his verbal output with 90% success with rare min A for error awareness    Status  Not Met      SLP SHORT TERM GOAL #6   Title  Pt will selectively attend to structured cognitive lingustic tasks in mildly distracting environment with 85% on task and occasional min A for attention over 2 sessions    Time  3    Period  Weeks    Status  On-going       SLP Long Term Goals - 07/09/18 1120      SLP LONG TERM GOAL #1   Title  pt will complete a HEP for improving accuracy of his verbal output with 90% success over three sessions    Time  2    Period  Weeks or 17 sessions    Status  Deferred to work on cognitive linguistics      SLP Speculator #2   Title  pt will participate in 6 minutes of simple-mod complex conversation appropriately, with occasional min A    Time  2    Period  Weeks or 17 sessions    Status  On-going      SLP LONG TERM GOAL #3   Title  pt  will demo understanding of 8 minutes simple-mod complex conversation over three visits, with occasional min A    Time  3    Period  Weeks or  17 visits    Status  On-going      SLP LONG TERM GOAL #4   Title  pt will demo understanding of 10 minutes mod complex conversation over three visits, with modified independence    Time  7    Period  Weeks or 25 total visits    Status  On-going      SLP LONG TERM GOAL #5   Title  pt will demo awareness of verbal speech errors by correcting 95% of errors in 10 minutes mod complex conversation over two sessions    Time  7    Period  Weeks or 25 visits    Status  On-going      SLP LONG TERM GOAL #6   Title  Pt will alternate attention between 2 simple cognitive linguistic tasks with 80% on each and occasional min A over 2 sessions    Time  7    Period  Weeks    Status  On-going      SLP LONG TERM GOAL #7   Title  Pt will utilize external aids for memory, schedule management, med management with occasional min A from family/caregivers over 2 sessions    Time  7    Period  Weeks    Status  On-going       Plan - 07/09/18 1015    Clinical Impression Statement  Mr. Egger cont to present today with 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 Today, SLP worked with pt's attention skills and attention to detail (awareness). He would cont to benefit from skilled ST targeting langauge skills (recpetive and expressive), and from cognitive linguistic thearpy in order to maximize langauge and cognitive skills.    Speech Therapy Frequency  2x / week    Duration  1 week 12 weeks/25 total visits    Treatment/Interventions  Internal/external aids;Patient/family education;Compensatory strategies;SLP instruction and feedback;Functional tasks;Cognitive reorganization;Cueing hierarchy;Environmental controls;Language facilitation    Potential to Achieve Goals  Good    Potential Considerations  Ability to learn/carryover information        Patient will benefit from skilled therapeutic intervention in order to improve the following deficits and impairments:   Aphasia  Cognitive communication deficit    Problem List Patient Active Problem List   Diagnosis Date Noted  . DVT, lower extremity, distal, acute, right ([place]) 04/25/2018  . Acute lower UTI   . Atelectasis   . Hypoalbuminemia due to protein-calorie malnutrition ([place])   . Left temporal lobe hemorrhage ([place]) 04/15/2018  . Vascular headache   . Seizure prophylaxis   . Cerebral edema ([facility])   . Atrial fibrillation with rapid ventricular response ([place])   . Pneumonia   . Diabetes mellitus type 2 in nonobese (HCC)   . Benign essential HTN   . History of pancreatic cancer   . FUO (fever of unknown origin)   . Dysphagia, post-stroke   . Tachycardia   . Tachypnea   . Hypothyroidism   . Hypernatremia   . Hypokalemia   . Acute blood loss anemia   . Thrombocytopenia ([place])   . Intracranial hemorrhage ([place]) 04/05/2018  . Acute pancreatitis 06/11/2014  . Headache 06/11/2014  . 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 Ampullary carcinoma ([facility]) 12/06/2012  . Obstructive jaundice 11/11/2012  . Biliary stricture 11/11/2012  . Diabetes mellitus ([place]) 11/10/2012  . HTN (hypertension) 11/10/2012  . Pancreatitis 11/10/2012  . Abdominal pain 11/10/2012    [facility] ,[place], [place]  07/09/2018, 11:25 AM  [facility] [address] [place], [place], [address] Phone: [phone]   Fax:  [phone]   Name: Hector Pugh MRN: [id number] Date of Birth: 12/26/1960

## 2018-07-09 NOTE — Patient Instructions (Signed)
  Please complete the assigned speech therapy homework prior to your next session and return it to the speech therapist at your next visit.  

## 2018-07-11 ENCOUNTER — Encounter
Payer: Managed Care, Other (non HMO) | Attending: Physical Medicine & Rehabilitation | Admitting: Physical Medicine & Rehabilitation

## 2018-07-11 ENCOUNTER — Encounter: Payer: Managed Care, Other (non HMO) | Admitting: Occupational Therapy

## 2018-07-11 ENCOUNTER — Encounter: Payer: Self-pay | Admitting: Physical Medicine & Rehabilitation

## 2018-07-11 ENCOUNTER — Ambulatory Visit: Payer: Managed Care, Other (non HMO)

## 2018-07-11 VITALS — BP 125/76 | HR 49 | Ht 66.5 in | Wt 150.0 lb

## 2018-07-11 DIAGNOSIS — R4701 Aphasia: Secondary | ICD-10-CM | POA: Diagnosis not present

## 2018-07-11 DIAGNOSIS — I824Z1 Acute embolism and thrombosis of unspecified deep veins of right distal lower extremity: Secondary | ICD-10-CM | POA: Diagnosis not present

## 2018-07-11 DIAGNOSIS — M545 Low back pain, unspecified: Secondary | ICD-10-CM

## 2018-07-11 DIAGNOSIS — G8929 Other chronic pain: Secondary | ICD-10-CM

## 2018-07-11 DIAGNOSIS — I69120 Aphasia following nontraumatic intracerebral hemorrhage: Secondary | ICD-10-CM | POA: Diagnosis not present

## 2018-07-11 DIAGNOSIS — Z90411 Acquired partial absence of pancreas: Secondary | ICD-10-CM | POA: Diagnosis not present

## 2018-07-11 DIAGNOSIS — I4891 Unspecified atrial fibrillation: Secondary | ICD-10-CM | POA: Insufficient documentation

## 2018-07-11 DIAGNOSIS — R41841 Cognitive communication deficit: Secondary | ICD-10-CM

## 2018-07-11 DIAGNOSIS — I10 Essential (primary) hypertension: Secondary | ICD-10-CM | POA: Diagnosis not present

## 2018-07-11 DIAGNOSIS — I69151 Hemiplegia and hemiparesis following nontraumatic intracerebral hemorrhage affecting right dominant side: Secondary | ICD-10-CM | POA: Insufficient documentation

## 2018-07-11 DIAGNOSIS — Z8507 Personal history of malignant neoplasm of pancreas: Secondary | ICD-10-CM | POA: Insufficient documentation

## 2018-07-11 DIAGNOSIS — I611 Nontraumatic intracerebral hemorrhage in hemisphere, cortical: Secondary | ICD-10-CM | POA: Diagnosis not present

## 2018-07-11 NOTE — Progress Notes (Addendum)
Subjective:    Patient ID: Hector Pugh, male    DOB: [birth year], 58 y.o.   MRN: [id number]  HPI 57 year old male with history of pancreatic cancer, T2DM, HTN presents for follow up for left temporal lobe hemorrhage.   Last clinic visit 05/09/18.  Daughter present, who supplements history. Since that time, he has completed all, but SLP.  He is following up with Neurology. He saw Neurosurg and is not sure if he needs to go back.  He saw Cardiology and continues on Eliquis for 6 months. He has generalized soreness. BP is controlled. He has weaned several of his medications.  Pain Inventory Average Pain 0 Pain Right Now 0 My pain is na  In the last 24 hours, has pain interfered with the following? General activity 0 Relation with others 0 Enjoyment of life 0 What TIME of day is your pain at its worst? na Sleep (in general) Good  Pain is worse with: na Pain improves with: na Relief from Meds: na  Mobility walk without assistance ability to climb steps?  yes do you drive?  no  Function employed # of hrs/week 40  Neuro/Psych No problems in this area  Prior Studies Any changes since last visit?  no  Physicians involved in your care Any changes since last visit?  no   Family History  Problem Relation Age of Onset  . Cancer Mother        esophageal carcinoma   Social History   Socioeconomic History  . Marital status: Married    Spouse name: Not on file  . Number of children: Not on file  . Years of education: Not on file  . Highest education level: Not on file  Occupational History  . Not on file  Social Needs  . Financial resource strain: Not on file  . Food insecurity:    Worry: Not on file    Inability: Not on file  . Transportation needs:    Medical: Not on file    Non-medical: Not on file  Tobacco Use  . Smoking status: Never Smoker  . Smokeless tobacco: Never Used  Substance and Sexual Activity  . Alcohol use: No  . Drug use: No  . Sexual  activity: Never  Lifestyle  . Physical activity:    Days per week: Not on file    Minutes per session: Not on file  . Stress: Not on file  Relationships  . Social connections:    Talks on phone: Not on file    Gets together: Not on file    Attends religious service: Not on file    Active member of club or organization: Not on file    Attends meetings of clubs or organizations: Not on file    Relationship status: Not on file  Other Topics Concern  . Not on file  Social History Narrative  . Not on file   Past Surgical History:  Procedure Laterality Date  . circucision    . circumsision    . COLONOSCOPY N/A 04/04/2018   Procedure: COLONOSCOPY;  Surgeon: Leighton Ruff, MD;  [facility];  Service: Endoscopy;  Laterality: N/A;  . CRANIOTOMY Left 04/05/2018   Procedure: Left Temporal CRANIOTOMY HEMATOMA EVACUATION of Intracranial Hemorrhage;  Surgeon: Ashok Pall, MD;  Location: [facility];  Service: Neurosurgery;  Laterality: Left;  . ERCP  11/08/2012   Procedure: ENDOSCOPIC RETROGRADE CHOLANGIOPANCREATOGRAPHY (ERCP);  Surgeon: Jeryl Columbia, MD;  Location: [facility];  Service: Gastroenterology;  Laterality:  N/A;  . EUS  11/08/2012   Procedure: UPPER ENDOSCOPIC ULTRASOUND (EUS) LINEAR;  Surgeon: Arta Silence, MD;  [facility];  Service: Endoscopy;  Laterality: N/A;  . FINE NEEDLE ASPIRATION  11/08/2012   Procedure: FINE NEEDLE ASPIRATION (FNA) LINEAR;  Surgeon: Arta Silence, MD;  [facility];  Service: Endoscopy;  Laterality: N/A;  . HERNIA REPAIR     RIGHT  . LAPAROSCOPY  12/12/2012   Procedure: LAPAROSCOPY DIAGNOSTIC;  Surgeon: Stark Klein, MD;  [facility];  Service: General;  Laterality: N/A;  . Left Foot Surgery    . Left Foot Surgery    . VASECTOMY    . WHIPPLE PROCEDURE  12/12/2012   Procedure: WHIPPLE PROCEDURE;  Surgeon: Stark Klein, MD;  [facility];  Service: General;  Laterality: N/A;   Past Medical History:  Diagnosis Date  .  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 Cancer ([place])   . Diabetes mellitus ([place]) 11/10/2012  . Diabetes mellitus without complication (HCC)    diet controlled  . HTN (hypertension) 11/10/2012  . Hypertension   . 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 Pancreatic cancer ([place])   . Pancreatitis 11/10/2012  . Seasonal allergies    BP 125/76   Pulse (!) 49   Ht 5' 6.5" (1.689 m)   Wt 150 lb (68 kg)   SpO2 99%   BMI 23.85 kg/m   Opioid Risk Score:   Fall Risk Score:  `1  Depression screen PHQ 2/9  Depression screen PHQ 2/9 05/09/2018  Decreased Interest 0  Down, Depressed, Hopeless 1  PHQ - 2 Score 1  Altered sleeping 0  Tired, decreased energy 1  Change in appetite 0  Feeling bad or failure about yourself  0  Trouble concentrating 1  Moving slowly or fidgety/restless 0  Suicidal thoughts 0  PHQ-9 Score 3  Difficult doing work/chores Not difficult at all     Review of Systems  Constitutional: Negative.   HENT: Negative.   Eyes: Negative.   Respiratory: Negative.   Cardiovascular: Negative.   Gastrointestinal: Negative.   Endocrine: Negative.   Genitourinary: Negative.   Musculoskeletal: Negative.   Skin: Negative.   Allergic/Immunologic: Negative.   Neurological: Negative.   Hematological: Negative.   Psychiatric/Behavioral: Negative.   All other systems reviewed and are negative.     Objective:   Physical Exam Constitutional: No distress . Vital signs reviewed. HENT: Normocephalic.  Craniotomy incision C/D/I Eyes: EOMI. No discharge. Cardiovascular: RRR. No JVD. Respiratory: CTA bilaterall. Normal effort. GI: BS +. Non-distended. Musc: No edema. Neurological: He is alert.  Aphasia significantly improved Right inattention Right facial weakness with minimal dysarthria. Motor: LUE/LLE: 5/5 proximal to distal RUE: 4+-5/5 proximal to distal  RLE: 4+/5 proximal to distal  Skin: Warm and dry. Intact Psychiatric: Normal  mood and behavior.    Assessment & Plan:  57 year old male with history of pancreatic cancer, T2DM, HTN presents for follow up for left temporal lobe hemorrhage.   1. 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 DVT right gastroc and soleal veins  Cont eliquis per Cards  3. Chronic low back pain  Likely due to increase in activity  Cont Robaxin  500 BID PRN  4. Coping  Family to consider Neuropsych referral  >25 minutes spent with patient with >20 minutes with counseling regarding vision, return to work, driving, anticoagulation, Neurospsych

## 2018-07-11 NOTE — Therapy (Signed)
[facility] [address] [place], [place], [address] Phone: [phone]   Fax:  [phone]  Speech Language Pathology Treatment  Patient Details  Name: Hector Pugh MRN: [id number] Date of Birth: Oct 15, 1961 Referring Provider: Delice Lesch, MD   Encounter Date: 07/11/2018  End of Session - 07/11/18 1219    Visit Number  13    Number of Visits  25    Date for SLP Re-Evaluation  08/01/18    Authorization Type  pre-auth - [employer]    Authorization Time Period  05-10-18 to 11-09-18    Authorization - Visit Number  13    Authorization - Number of Visits  25    SLP Start Time  0933    SLP Stop Time   6381    SLP Time Calculation (min)  42 min    Activity Tolerance  Patient tolerated treatment well       Past Medical History:  Diagnosis Date  . 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 Cancer ([place])   . Diabetes mellitus ([place]) 11/10/2012  . Diabetes mellitus without complication (HCC)    diet controlled  . HTN (hypertension) 11/10/2012  . Hypertension   . 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 Pancreatic cancer ([place])   . Pancreatitis 11/10/2012  . Seasonal allergies     Past Surgical History:  Procedure Laterality Date  . circucision    . circumsision    . COLONOSCOPY N/A 04/04/2018   Procedure: COLONOSCOPY;  Surgeon: Leighton Ruff, MD;  [facility];  Service: Endoscopy;  Laterality: N/A;  . CRANIOTOMY Left 04/05/2018   Procedure: Left Temporal CRANIOTOMY HEMATOMA EVACUATION of Intracranial Hemorrhage;  Surgeon: Ashok Pall, MD;  Location: [facility];  Service: Neurosurgery;  Laterality: Left;  . ERCP  11/08/2012   Procedure: ENDOSCOPIC RETROGRADE CHOLANGIOPANCREATOGRAPHY (ERCP);  Surgeon: Jeryl Columbia, MD;  [facility];  Service: Gastroenterology;  Laterality: N/A;  . EUS  11/08/2012   Procedure: UPPER ENDOSCOPIC ULTRASOUND (EUS) LINEAR;  Surgeon: Arta Silence, MD;   [facility];  Service: Endoscopy;  Laterality: N/A;  . FINE NEEDLE ASPIRATION  11/08/2012   Procedure: FINE NEEDLE ASPIRATION (FNA) LINEAR;  Surgeon: Arta Silence, MD;  [facility];  Service: Endoscopy;  Laterality: N/A;  . HERNIA REPAIR     RIGHT  . LAPAROSCOPY  12/12/2012   Procedure: LAPAROSCOPY DIAGNOSTIC;  Surgeon: Stark Klein, MD;  [facility];  Service: General;  Laterality: N/A;  . Left Foot Surgery    . Left Foot Surgery    . VASECTOMY    . WHIPPLE PROCEDURE  12/12/2012   Procedure: WHIPPLE PROCEDURE;  Surgeon: Stark Klein, MD;  [facility];  Service: General;  Laterality: N/A;    There were no vitals filed for this visit.         ADULT SLP TREATMENT - 07/11/18 1002      General Information   Behavior/Cognition  Alert;Cooperative;Pleasant mood      Treatment Provided   Treatment provided  Cognitive-Linquistic      Cognitive-Linquistic Treatment   Treatment focused on  Cognition;Aphasia    Skilled Treatment  Speech tx: SLP used skilled observation and assessment to ascertain pt's abilities in 10 minutes mod complex conversation were completed with modified independence, receptive and expressive. (cognitive tx 20 minutes): SLP reviewed pt's detailed homework with him re: mathematic computations. Pt reported double checking one task and not the other. The task that  was double checked had ZERO errors, the task that was not double checked had 3 errors. SLP highlighted this situation for pt and reiterated the need to "do the task over again" as pt was copying down numbers incorrectly and not just doing incorrect arithmetic.       Assessment / Recommendations / Plan   Plan  Continue with current plan of care      Progression Toward Goals   Progression toward goals  Progressing toward goals       SLP Education - 07/11/18 1218    Education Details  need to double check all tasks "do the task over again"    Person(s) Educated  Patient     Methods  Explanation    Comprehension  Verbalized understanding;Need further instruction       SLP Short Term Goals - 07/11/18 [place] #1   Title  pt will generate sentence responses in min-mod complex language tasks 80% accuracy with min-mod A occasionally for error awareness    Period  --    Status  Not Met      SLP SHORT TERM GOAL #2   Title  pt will read sentences of 8-9 words with error awareness at 90% over three sessions    Status  Not Met      SLP SHORT TERM GOAL #3   Title  pt will demo understanding of spoken sentence level stimuli with 80% success and occasional min A over two sessions    Status  Partially Met      SLP SHORT TERM GOAL #4   Title  pt will complete cognitive linguistic testing within first 4 therapy sessions    Period  --    Status  Achieved      SLP SHORT TERM GOAL #5   Title  pt will complete a HEP for increasing accuracy of his verbal output with 90% success with rare min A for error awareness    Status  Not Met      SLP SHORT TERM GOAL #6   Title  Pt will selectively attend to structured cognitive lingustic tasks in mildly distracting environment with 85% on task and occasional min A for attention over 2 sessions    Status  Achieved       SLP Long Term Goals - 07/11/18 [place] #1   Title  pt will complete a HEP for improving accuracy of his verbal output with 90% success over three sessions    Time  2    Period  Weeks    Status  Deferred      SLP LONG TERM GOAL #2   Title  pt will participate in 6 minutes of simple-mod complex conversation appropriately, with occasional min A    Period  --    Status  Achieved      SLP LONG TERM GOAL #3   Title  pt will demo understanding of 8 minutes simple-mod complex conversation over three visits, with occasional min A    Baseline  07-11-18    Time  3    Period  Weeks    Status  On-going      SLP LONG TERM GOAL #4   Title  pt will demo understanding of 10  minutes mod complex conversation over three visits, with modified independence    Baseline  07-11-18    Time  7  Period  Weeks    Status  On-going      SLP LONG TERM GOAL #5   Title  pt will demo awareness of verbal speech errors by correcting 95% of errors in 10 minutes mod complex conversation over two sessions    Baseline  07-11-18    Time  7    Period  Weeks    Status  On-going      SLP LONG TERM GOAL #6   Title  Pt will alternate attention between 2 simple cognitive linguistic tasks with 80% on each and occasional min A over 2 sessions    Time  7    Period  Weeks    Status  On-going      SLP LONG TERM GOAL #7   Title  Pt will utilize external aids for memory, schedule management, med management with occasional min A from family/caregivers over 2 sessions    Time  7    Period  Weeks    Status  On-going       Plan - 07/11/18 1219    Clinical Impression Statement  Hector Pugh cont to present today with cognitive-linguistic deficits and expressive and receptive aphasia, although expressive/receptive aphasia looks to be resolving. Today, SLP targeted pt's awareness and attention skills. He would cont to benefit from skilled ST targeting langauge skills (recpetive and expressive) in complex conversation, and from cognitive linguistic thearpy in order to maximize langauge and cognitive skills.    Speech Therapy Frequency  2x / week    Duration  1 week    Treatment/Interventions  Internal/external aids;Patient/family education;Compensatory strategies;SLP instruction and feedback;Functional tasks;Cognitive reorganization;Cueing hierarchy;Environmental controls;Language facilitation    Potential to Achieve Goals  Good    Potential Considerations  Ability to learn/carryover information       Patient will benefit from skilled therapeutic intervention in order to improve the following deficits and impairments:   Cognitive communication deficit  Aphasia    Problem List Patient Active  Problem List   Diagnosis Date Noted  . DVT, lower extremity, distal, acute, right ([place]) 04/25/2018  . Acute lower UTI   . Atelectasis   . Hypoalbuminemia due to protein-calorie malnutrition ([place])   . Left temporal lobe hemorrhage ([place]) 04/15/2018  . Vascular headache   . Seizure prophylaxis   . Cerebral edema ([facility])   . Atrial fibrillation with rapid ventricular response ([place])   . Pneumonia   . Diabetes mellitus type 2 in nonobese (HCC)   . Benign essential HTN   . History of pancreatic cancer   . FUO (fever of unknown origin)   . Dysphagia, post-stroke   . Tachycardia   . Tachypnea   . Hypothyroidism   . Hypernatremia   . Hypokalemia   . Acute blood loss anemia   . Thrombocytopenia ([place])   . Intracranial hemorrhage ([place]) 04/05/2018  . Acute pancreatitis 06/11/2014  . Headache 06/11/2014  . 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 Obstructive jaundice 11/11/2012  . Biliary stricture 11/11/2012  . Diabetes mellitus ([place]) 11/10/2012  . HTN (hypertension) 11/10/2012  . Pancreatitis 11/10/2012  . Abdominal pain 11/10/2012    [facility] ,[place], CCC-SLP  07/11/2018, 12:21 PM  [facility] [address] [place], [place], [address] Phone: [phone]   Fax:  [phone]   Name: Hector Pugh MRN: [id number] Date of Birth: 07-31-1961

## 2018-07-11 NOTE — Patient Instructions (Signed)
  Please complete the assigned speech therapy homework prior to your next session and return it to the speech therapist at your next visit.  

## 2018-07-16 ENCOUNTER — Encounter: Payer: Managed Care, Other (non HMO) | Admitting: Occupational Therapy

## 2018-07-17 ENCOUNTER — Ambulatory Visit: Payer: Managed Care, Other (non HMO)

## 2018-07-17 ENCOUNTER — Encounter: Payer: Managed Care, Other (non HMO) | Admitting: Occupational Therapy

## 2018-07-17 DIAGNOSIS — R4701 Aphasia: Secondary | ICD-10-CM | POA: Diagnosis not present

## 2018-07-17 DIAGNOSIS — R41841 Cognitive communication deficit: Secondary | ICD-10-CM

## 2018-07-18 ENCOUNTER — Ambulatory Visit: Payer: Managed Care, Other (non HMO)

## 2018-07-18 DIAGNOSIS — R41841 Cognitive communication deficit: Secondary | ICD-10-CM

## 2018-07-18 DIAGNOSIS — R4701 Aphasia: Secondary | ICD-10-CM

## 2018-07-18 NOTE — Patient Instructions (Signed)
  Please complete the assigned speech therapy homework prior to your next session and return it to the speech therapist at your next visit.  

## 2018-07-18 NOTE — Therapy (Signed)
[facility] [address] [place], [place], [address] Phone: [phone]   Fax:  [phone]  Speech Language Pathology Treatment  Patient Details  Name: Hector Pugh MRN: [id number] Date of Birth: [birth year] Referring Provider: Delice Lesch, MD   Encounter Date: 07/17/2018  End of Session - 07/18/18 0837    Visit Number  14    Number of Visits  25    Date for SLP Re-Evaluation  08/01/18    Authorization Type  pre-auth - [employer]    Authorization Time Period  05-10-18 to 11-09-18    Authorization - Visit Number  14    Authorization - Number of Visits  25    SLP Start Time  7482    SLP Stop Time   1400    SLP Time Calculation (min)  41 min    Activity Tolerance  Patient tolerated treatment well       Past Medical History:  Diagnosis Date  . 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 Cancer ([place])   . Diabetes mellitus ([place]) 11/10/2012  . Diabetes mellitus without complication (HCC)    diet controlled  . HTN (hypertension) 11/10/2012  . Hypertension   . 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 Pancreatic cancer ([place])   . Pancreatitis 11/10/2012  . Seasonal allergies     Past Surgical History:  Procedure Laterality Date  . circucision    . circumsision    . COLONOSCOPY N/A 04/04/2018   Procedure: COLONOSCOPY;  Surgeon: Leighton Ruff, MD;  [facility];  Service: Endoscopy;  Laterality: N/A;  . CRANIOTOMY Left 04/05/2018   Procedure: Left Temporal CRANIOTOMY HEMATOMA EVACUATION of Intracranial Hemorrhage;  Surgeon: Ashok Pall, MD;  Location: [facility];  Service: Neurosurgery;  Laterality: Left;  . ERCP  11/08/2012   Procedure: ENDOSCOPIC RETROGRADE CHOLANGIOPANCREATOGRAPHY (ERCP);  Surgeon: Jeryl Columbia, MD;  [facility];  Service: Gastroenterology;  Laterality: N/A;  . EUS  11/08/2012   Procedure: UPPER ENDOSCOPIC ULTRASOUND (EUS) LINEAR;  Surgeon: Arta Silence,  MD;  [facility];  Service: Endoscopy;  Laterality: N/A;  . FINE NEEDLE ASPIRATION  11/08/2012   Procedure: FINE NEEDLE ASPIRATION (FNA) LINEAR;  Surgeon: Arta Silence, MD;  [facility];  Service: Endoscopy;  Laterality: N/A;  . HERNIA REPAIR     RIGHT  . LAPAROSCOPY  12/12/2012   Procedure: LAPAROSCOPY DIAGNOSTIC;  Surgeon: Stark Klein, MD;  [facility];  Service: General;  Laterality: N/A;  . Left Foot Surgery    . Left Foot Surgery    . VASECTOMY    . WHIPPLE PROCEDURE  12/12/2012   Procedure: WHIPPLE PROCEDURE;  Surgeon: Stark Klein, MD;  [facility];  Service: General;  Laterality: N/A;    There were no vitals filed for this visit.  Subjective Assessment - 07/17/18 1322    Subjective  Daughter came to SLP and told him that pt struggled with written directions. Saw Dr. Posey Pronto - talk of neuropsych eval. SLP agrees with this recommendation.    Currently in Pain?  No/denies            ADULT SLP TREATMENT - 07/17/18 1324      General Information   Behavior/Cognition  Alert;Cooperative;Pleasant mood      Treatment Provided   Treatment provided  Cognitive-Linquistic      Cognitive-Linquistic Treatment   Treatment focused on  Aphasia    Skilled Treatment  Given pt's "s" statement, SLP  worked with pt on verbal math story problems. Pt took min extra time and req'd repeats due to misunderstanding directions approx 35% of the time. Pt began to write cues from auditory information and still req'd repeats after this. SLP provided homework for pt for written and auditory directions.     Assessment / Recommendations / Plan   Plan  Continue with current plan of care         SLP Short Term Goals - 07/18/18 0837      SLP SHORT TERM GOAL #1   Title  pt will generate sentence responses in min-mod complex language tasks 80% accuracy with min-mod A occasionally for error awareness    Period  --   ot 9 total sessions, for all STGs   Status  Not Met       SLP SHORT TERM GOAL #2   Title  pt will read sentences of 8-9 words with error awareness at 90% over three sessions    Status  Not Met      SLP SHORT TERM GOAL #3   Title  pt will demo understanding of spoken sentence level stimuli with 80% success and occasional min A over two sessions    Status  Partially Met      SLP SHORT TERM GOAL #4   Title  pt will complete cognitive linguistic testing within first 4 therapy sessions    Period  --   or 5 total sessions   Status  Achieved      SLP SHORT TERM GOAL #5   Title  pt will complete a HEP for increasing accuracy of his verbal output with 90% success with rare min A for error awareness    Status  Not Met      SLP SHORT TERM GOAL #6   Title  Pt will selectively attend to structured cognitive lingustic tasks in mildly distracting environment with 85% on task and occasional min A for attention over 2 sessions    Status  Achieved       SLP Long Term Goals - 07/18/18 7989      SLP LONG TERM GOAL #1   Title  pt will complete a HEP for improving accuracy of his verbal output with 90% success over three sessions    Time  1    Period  Weeks   or 17 sessions   Status  Deferred   to work on cognitive linguistics     SLP [place] #2   Title  pt will participate in 6 minutes of simple-mod complex conversation appropriately, with occasional min A    Period  --   or 17 sessions   Status  Achieved      SLP LONG TERM GOAL #3   Title  pt will demo understanding of 8 minutes simple-mod complex conversation over three visits, with occasional min A    Baseline  07-11-18    Time  2    Period  Weeks   or 17 visits   Status  On-going      SLP LONG TERM GOAL #4   Title  pt will demo understanding of 10 minutes mod complex conversation over three visits, with modified independence    Baseline  07-11-18    Time  6    Period  Weeks   or 25 total visits   Status  On-going      SLP LONG TERM GOAL #5   Title  pt will demo awareness of  verbal speech errors by correcting 95% of errors in 10 minutes mod complex conversation over two sessions    Baseline  07-11-18    Time  6    Period  Weeks   or 25 visits   Status  On-going      SLP LONG TERM GOAL #6   Title  Pt will alternate attention between 2 simple cognitive linguistic tasks with 80% on each and occasional min A over 2 sessions    Time  6    Period  Weeks    Status  On-going      SLP LONG TERM GOAL #7   Title  Pt will utilize external aids for memory, schedule management, med management with occasional min A from family/caregivers over 2 sessions    Time  6    Period  Weeks    Status  On-going         Patient will benefit from skilled therapeutic intervention in order to improve the following deficits and impairments:   Aphasia  Cognitive communication deficit    Problem List Patient Active Problem List   Diagnosis Date Noted  . DVT, lower extremity, distal, acute, right ([place]) 04/25/2018  . Acute lower UTI   . Atelectasis   . Hypoalbuminemia due to protein-calorie malnutrition ([place])   . Left temporal lobe hemorrhage ([place]) 04/15/2018  . Vascular headache   . Seizure prophylaxis   . Cerebral edema ([facility])   . Atrial fibrillation with rapid ventricular response ([place])   . Pneumonia   . Diabetes mellitus type 2 in nonobese ([facility])   . Benign essential HTN   . History of pancreatic cancer   . FUO (fever of unknown origin)   . Dysphagia, post-stroke   . Tachycardia   . Tachypnea   . Hypothyroidism   . Hypernatremia   . Hypokalemia   . Acute blood loss anemia   . Thrombocytopenia ([place])   . Intracranial hemorrhage ([place]) 04/05/2018  . Acute pancreatitis 06/11/2014  . Headache 06/11/2014  . 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 Obstructive jaundice 11/11/2012  . Biliary stricture 11/11/2012  . Diabetes mellitus ([place]) 11/10/2012  . HTN (hypertension) 11/10/2012  . Pancreatitis 11/10/2012  .  Abdominal pain 11/10/2012    [facility] ,MS, [place]  07/18/2018, 8:38 AM  [facility] [address] [place], [place], [address] Phone: [phone]   Fax:  [phone]   Name: Hector Pugh MRN: [id number] Date of Birth: 1961-06-26

## 2018-07-18 NOTE — Therapy (Signed)
[facility] [address] [place], [place], [address] Phone: [phone]   Fax:  [phone]  Speech Language Pathology Treatment  Patient Details  Name: Hector Pugh MRN: [id number] Date of Birth: [birth year] Referring Provider: Delice Lesch, MD   Encounter Date: 07/18/2018    Past Medical History:  Diagnosis Date  . 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 Cancer ([place])   . Diabetes mellitus ([place]) 11/10/2012  . Diabetes mellitus without complication (HCC)    diet controlled  . HTN (hypertension) 11/10/2012  . Hypertension   . 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 Pancreatic cancer ([place])   . Pancreatitis 11/10/2012  . Seasonal allergies     Past Surgical History:  Procedure Laterality Date  . circucision    . circumsision    . COLONOSCOPY N/A 04/04/2018   Procedure: COLONOSCOPY;  Surgeon: Leighton Ruff, MD;  [facility];  Service: Endoscopy;  Laterality: N/A;  . CRANIOTOMY Left 04/05/2018   Procedure: Left Temporal CRANIOTOMY HEMATOMA EVACUATION of Intracranial Hemorrhage;  Surgeon: Ashok Pall, MD;  Location: [facility];  Service: Neurosurgery;  Laterality: Left;  . ERCP  11/08/2012   Procedure: ENDOSCOPIC RETROGRADE CHOLANGIOPANCREATOGRAPHY (ERCP);  Surgeon: Jeryl Columbia, MD;  [facility];  Service: Gastroenterology;  Laterality: N/A;  . EUS  11/08/2012   Procedure: UPPER ENDOSCOPIC ULTRASOUND (EUS) LINEAR;  Surgeon: Arta Silence, MD;  [facility];  Service: Endoscopy;  Laterality: N/A;  . FINE NEEDLE ASPIRATION  11/08/2012   Procedure: FINE NEEDLE ASPIRATION (FNA) LINEAR;  Surgeon: Arta Silence, MD;  [facility];  Service: Endoscopy;  Laterality: N/A;  . HERNIA REPAIR     RIGHT  . LAPAROSCOPY  12/12/2012   Procedure: LAPAROSCOPY DIAGNOSTIC;  Surgeon: Stark Klein, MD;  [facility];  Service: General;  Laterality: N/A;  . Left  Foot Surgery    . Left Foot Surgery    . VASECTOMY    . WHIPPLE PROCEDURE  12/12/2012   Procedure: WHIPPLE PROCEDURE;  Surgeon: Stark Klein, MD;  [facility];  Service: General;  Laterality: N/A;    There were no vitals filed for this visit.  Subjective Assessment - 07/18/18 0938    Subjective  Daughter said homework went better but not 100% understood.    Currently in Pain?  No/denies            ADULT SLP TREATMENT - 07/18/18 0946      General Information   Behavior/Cognition  Alert;Cooperative;Pleasant mood      Treatment Provided   Treatment provided  Cognitive-Linquistic      Cognitive-Linquistic Treatment   Treatment focused on  Aphasia    Skilled Treatment  Given  "s" statement, SLP worked with pt on written detailed directions. Pt's aphasia was apparent in his reading. Pt took moderate extra time and req'd to reread directions consistently. SLP told pt to underline pertinent words directing him what to do - pt did with rare min A for relevance. He req'd min-mod A occasionally for hot to mark items (underline, star, cross out, etc).       Assessment / Recommendations / Plan   Plan  Continue with current plan of care      Progression Toward Goals   Progression toward goals  Progressing toward goals         SLP Short Term Goals - 07/18/18 0837      SLP SHORT TERM GOAL #1  Title  pt will generate sentence responses in min-mod complex language tasks 80% accuracy with min-mod A occasionally for error awareness    Period  --   ot 9 total sessions, for all STGs   Status  Not Met      SLP SHORT TERM GOAL #2   Title  pt will read sentences of 8-9 words with error awareness at 90% over three sessions    Status  Not Met      SLP SHORT TERM GOAL #3   Title  pt will demo understanding of spoken sentence level stimuli with 80% success and occasional min A over two sessions    Status  Partially Met      SLP SHORT TERM GOAL #4   Title  pt will complete cognitive  linguistic testing within first 4 therapy sessions    Period  --   or 5 total sessions   Status  Achieved      SLP SHORT TERM GOAL #5   Title  pt will complete a HEP for increasing accuracy of his verbal output with 90% success with rare min A for error awareness    Status  Not Met      SLP SHORT TERM GOAL #6   Title  Pt will selectively attend to structured cognitive lingustic tasks in mildly distracting environment with 85% on task and occasional min A for attention over 2 sessions    Status  Achieved       SLP Long Term Goals - 07/18/18 1403      SLP LONG TERM GOAL #1   Title  pt will complete a HEP for improving accuracy of his verbal output with 90% success over three sessions    Time  1    Period  Weeks   or 17 sessions   Status  Deferred   to work on cognitive linguistics     SLP [place] #2   Title  pt will participate in 6 minutes of simple-mod complex conversation appropriately, with occasional min A    Period  --   or 17 sessions   Status  Achieved      SLP LONG TERM GOAL #3   Title  pt will demo understanding of 8 minutes simple-mod complex conversation over three visits, with occasional min A    Baseline  07-11-18, 07-18-18    Time  2    Period  Weeks   or 17 visits   Status  On-going      SLP LONG TERM GOAL #4   Title  pt will demo understanding of 10 minutes mod complex conversation over three visits, with modified independence    Baseline  07-11-18    Time  6    Period  Weeks   or 25 total visits   Status  On-going      SLP LONG TERM GOAL #5   Title  pt will demo awareness of verbal speech errors by correcting 95% of errors in 10 minutes mod complex conversation over two sessions    Baseline  07-11-18    Time  6    Period  Weeks   or 25 visits   Status  On-going      SLP LONG TERM GOAL #6   Title  Pt will alternate attention between 2 simple cognitive linguistic tasks with 80% on each and occasional min A over 2 sessions    Time  6    Period   Weeks  Status  On-going      SLP LONG TERM GOAL #7   Title  Pt will utilize external aids for memory, schedule management, med management with occasional min A from family/caregivers over 2 sessions    Time  6    Period  Weeks    Status  On-going       Plan - 07/18/18 1402    Clinical Impression Statement  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 Pt's reading comprehension is a relative deficit area targeted today. He would cont to benefit from skilled ST targeting langauge skills (recpetive and expressive) in complex conversation, and from cognitive linguistic thearpy in order to maximize langauge and cognitive skills.    Speech Therapy Frequency  2x / week    Duration  --   12 weeks/25 total visits   Treatment/Interventions  Internal/external aids;Patient/family education;Compensatory strategies;SLP instruction and feedback;Functional tasks;Cognitive reorganization;Cueing hierarchy;Environmental controls;Language facilitation    Potential to Achieve Goals  Good    Potential Considerations  Ability to learn/carryover information       Patient will benefit from skilled therapeutic intervention in order to improve the following deficits and impairments:   Cognitive communication deficit  Aphasia    Problem List Patient Active Problem List   Diagnosis Date Noted  . DVT, lower extremity, distal, acute, right ([place]) 04/25/2018  . Acute lower UTI   . Atelectasis   . Hypoalbuminemia due to protein-calorie malnutrition ([place])   . Left temporal lobe hemorrhage ([place]) 04/15/2018  . Vascular headache   . Seizure prophylaxis   . Cerebral edema (HCC)   . Atrial fibrillation with rapid ventricular response ([place])   . Pneumonia   . Diabetes mellitus type 2 in nonobese (HCC)   . Benign essential HTN   . History of pancreatic cancer   . FUO (fever of unknown origin)    . Dysphagia, post-stroke   . Tachycardia   . Tachypnea   . Hypothyroidism   . Hypernatremia   . Hypokalemia   . Acute blood loss anemia   . Thrombocytopenia ([place])   . Intracranial hemorrhage ([place]) 04/05/2018  . Acute pancreatitis 06/11/2014  . Headache 06/11/2014  . 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 Obstructive jaundice 11/11/2012  . Biliary stricture 11/11/2012  . Diabetes mellitus ([place]) 11/10/2012  . HTN (hypertension) 11/10/2012  . Pancreatitis 11/10/2012  . Abdominal pain 11/10/2012    [facility] ,[place], [place]  07/18/2018, 2:05 PM  [facility] [address] [place], [place], [address] Phone: [phone]   Fax:  [phone]   Name: Abrham Maslowski MRN: [id number] Date of Birth: 1961/10/28

## 2018-07-19 ENCOUNTER — Ambulatory Visit: Payer: Managed Care, Other (non HMO)

## 2018-07-22 ENCOUNTER — Telehealth: Payer: Self-pay | Admitting: Adult Health

## 2018-07-22 NOTE — Telephone Encounter (Signed)
Revised. 

## 2018-07-22 NOTE — Telephone Encounter (Signed)
Rn call patients daughter [name] who is a Marine scientist. She stated pt saw neurosurgery inpatient and they recommend Ct scan or Ct of the head and neck for follow up on left temporal hemorrhage. Puja stated she call the neurosurgery to get scans order, and they only recommend cerebral angiogram. The daughter stated she did not want her father to be going thru intense test because they had a lot family events that were plan this summer.  When she spoke with the outpatient therapy tMD they stated pt only needs routine MRI or Ct scan. The daughter stated neurosurgery is trying to get consent via phone from her,and she does not agree. She does not agree because it was not discuss at the last outpatient office visit.She stated pt was last seen by Janett Billow NP in June 2019. Her father is not having any neurological issues at this time. She just wants to know if he can have a Ct scan or MRi to check for the hemorrhage. She does not want any test that would require sedation or angiogram. RN stated message will be sent to [facility] NP.

## 2018-07-22 NOTE — Telephone Encounter (Addendum)
Rn call patients daughter [name] about Janett Billow NP recommendation,and note. RN stated per Janett Billow NP there is no need for another Ct or MRi unless pt is having some new neurological or worsening old symptoms. Rn stated pt is on elqiuis since the hemorrhage was stable in last scan in May 2019. RN stated it was recommend she contact neurosurgery to get more information on the cerebral angiogram. The daughter was told he would be under general anesthesia at the hospital, and may need to stay overnight. Puja stated they are find with getting the scan, but did not want to get more scans if the cerebral angiogram would show the same. Puja appreciate the phone call,and will be calling neurosurgery Dr Cyndy Freeze.,

## 2018-07-22 NOTE — Telephone Encounter (Signed)
Pts daughter [name] requesting a call stating she would like to discuss having Janett Billow order a few imagining test for the pt. Please call to advise

## 2018-07-22 NOTE — Telephone Encounter (Signed)
There is no need for an additional CT or MRI to assess for hemorrhage unless symptoms worsening or new neurological symptoms. Many CT scans were repeated during admission and as the most recent one showed stable hemorrhage, it was not recommended to repeat. Eliquis was restarted at this time as well. Unsure what neurosurgery requesting cerebral angiogram. The daughter will have to schedule appointment with them to speak in regards to this study.

## 2018-07-24 ENCOUNTER — Encounter: Payer: Managed Care, Other (non HMO) | Admitting: Occupational Therapy

## 2018-07-24 ENCOUNTER — Ambulatory Visit: Payer: Managed Care, Other (non HMO)

## 2018-07-24 DIAGNOSIS — R4701 Aphasia: Secondary | ICD-10-CM

## 2018-07-24 DIAGNOSIS — R41841 Cognitive communication deficit: Secondary | ICD-10-CM

## 2018-07-24 NOTE — Therapy (Signed)
[facility] [address] [place], [place], [address] Phone: [phone]   Fax:  [phone]  Speech Language Pathology Treatment  Patient Details  Name: Hector Pugh MRN: [id number] Date of Birth: 04/28/1961 Referring Provider: Delice Lesch, MD   Encounter Date: 07/24/2018  End of Session - 07/24/18 1727    Visit Number  15    Number of Visits  25    Date for SLP Re-Evaluation  08/01/18    Authorization Type  pre-auth - [employer]    Authorization Time Period  05-10-18 to 11-09-18    Authorization - Visit Number  15    Authorization - Number of Visits  25    SLP Start Time  6767    SLP Stop Time   1615    SLP Time Calculation (min)  40 min    Activity Tolerance  Patient tolerated treatment well       Past Medical History:  Diagnosis Date  . 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 Cancer ([place])   . Diabetes mellitus ([place]) 11/10/2012  . Diabetes mellitus without complication (HCC)    diet controlled  . HTN (hypertension) 11/10/2012  . Hypertension   . 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 Pancreatic cancer ([place])   . Pancreatitis 11/10/2012  . Seasonal allergies     Past Surgical History:  Procedure Laterality Date  . circucision    . circumsision    . COLONOSCOPY N/A 04/04/2018   Procedure: COLONOSCOPY;  Surgeon: Leighton Ruff, MD;  [facility];  Service: Endoscopy;  Laterality: N/A;  . CRANIOTOMY Left 04/05/2018   Procedure: Left Temporal CRANIOTOMY HEMATOMA EVACUATION of Intracranial Hemorrhage;  Surgeon: Ashok Pall, MD;  Location: [facility];  Service: Neurosurgery;  Laterality: Left;  . ERCP  11/08/2012   Procedure: ENDOSCOPIC RETROGRADE CHOLANGIOPANCREATOGRAPHY (ERCP);  Surgeon: Jeryl Columbia, MD;  [facility];  Service: Gastroenterology;  Laterality: N/A;  . EUS  11/08/2012   Procedure: UPPER ENDOSCOPIC ULTRASOUND (EUS) LINEAR;  Surgeon: Arta Silence,  MD;  [facility];  Service: Endoscopy;  Laterality: N/A;  . FINE NEEDLE ASPIRATION  11/08/2012   Procedure: FINE NEEDLE ASPIRATION (FNA) LINEAR;  Surgeon: Arta Silence, MD;  [facility];  Service: Endoscopy;  Laterality: N/A;  . HERNIA REPAIR     RIGHT  . LAPAROSCOPY  12/12/2012   Procedure: LAPAROSCOPY DIAGNOSTIC;  Surgeon: Stark Klein, MD;  [facility];  Service: General;  Laterality: N/A;  . Left Foot Surgery    . Left Foot Surgery    . VASECTOMY    . WHIPPLE PROCEDURE  12/12/2012   Procedure: WHIPPLE PROCEDURE;  Surgeon: Stark Klein, MD;  [facility];  Service: General;  Laterality: N/A;    There were no vitals filed for this visit.  Subjective Assessment - 07/24/18 1607    Subjective  Pt brought homework to SLP.     Currently in Pain?  No/denies            ADULT SLP TREATMENT - 07/24/18 1724      General Information   Behavior/Cognition  Alert;Cooperative;Pleasant mood      Treatment Provided   Treatment provided  Cognitive-Linquistic      Cognitive-Linquistic Treatment   Treatment focused on  Aphasia    Skilled Treatment  Pt gets his own meds but family fills [facility] med box for him each week. SLP worked with pt today with written sentence comprehension. Pt  with 93% success with consistent extra time in f:4 sentence descriptions matched to a word. SLP also reviewed pt's homework with him and he req'd mod A occasionally with items he missed (3/20).      Assessment / Recommendations / Plan   Plan  Continue with current plan of care      Progression Toward Goals   Progression toward goals  Progressing toward goals         SLP Short Term Goals - 07/18/18 0837      SLP SHORT TERM GOAL #1   Title  pt will generate sentence responses in min-mod complex language tasks 80% accuracy with min-mod A occasionally for error awareness    Period  --   ot 9 total sessions, for all STGs   Status  Not Met      SLP SHORT TERM GOAL #2   Title  pt  will read sentences of 8-9 words with error awareness at 90% over three sessions    Status  Not Met      SLP SHORT TERM GOAL #3   Title  pt will demo understanding of spoken sentence level stimuli with 80% success and occasional min A over two sessions    Status  Partially Met      SLP SHORT TERM GOAL #4   Title  pt will complete cognitive linguistic testing within first 4 therapy sessions    Period  --   or 5 total sessions   Status  Achieved      SLP SHORT TERM GOAL #5   Title  pt will complete a HEP for increasing accuracy of his verbal output with 90% success with rare min A for error awareness    Status  Not Met      SLP SHORT TERM GOAL #6   Title  Pt will selectively attend to structured cognitive lingustic tasks in mildly distracting environment with 85% on task and occasional min A for attention over 2 sessions    Status  Achieved       SLP Long Term Goals - 07/24/18 1608      SLP LONG TERM GOAL #1   Title  pt will complete a HEP for improving accuracy of his verbal output with 90% success over three sessions    Period  --   or 17 sessions   Status  Deferred   to work on cognitive linguistics     SLP [place] #2   Title  pt will participate in 6 minutes of simple-mod complex conversation appropriately, with occasional min A    Period  --   or 17 sessions   Status  Achieved      SLP LONG TERM GOAL #3   Title  pt will demo understanding of 8 minutes simple-mod complex conversation over three visits, with occasional min A    Baseline  --    Time  --    Period  --   or 17 visits   Status  Achieved      SLP LONG TERM GOAL #4   Title  pt will demo understanding of 10 minutes mod complex conversation over three visits, with modified independence    Baseline  07-11-18    Time  5    Period  Weeks   or 25 total visits   Status  On-going      SLP LONG TERM GOAL #5   Title  pt will demo awareness of verbal speech errors by  correcting 95% of errors in 10 minutes  mod complex conversation over two sessions    Baseline  07-11-18    Time  5    Period  Weeks   or 25 visits   Status  On-going      SLP LONG TERM GOAL #6   Title  Pt will alternate attention between 2 simple cognitive linguistic tasks with 80% on each and occasional min A over 2 sessions   07-24-18   Time  5    Period  Weeks    Status  On-going      SLP LONG TERM GOAL #7   Title  Pt will utilize external aids for memory, schedule management, med management with occasional min A from family/caregivers over 2 sessions    Time  5    Period  Weeks    Status  On-going       Plan - 07/24/18 1728    Clinical Impression Statement  Pt cont to present today with 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 Pt's reading comprehension is a relative deficit area targeted again today, requiring consistent extra time in reading comprehension at the sentence level. He would cont to benefit from skilled ST targeting langauge skills (recpetive and expressive) in complex conversation, and from cognitive linguistic thearpy in order to maximize langauge and cognitive skills.    Speech Therapy Frequency  2x / week    Duration  --   12 weeks/25 total visits   Treatment/Interventions  Internal/external aids;Patient/family education;Compensatory strategies;SLP instruction and feedback;Functional tasks;Cognitive reorganization;Cueing hierarchy;Environmental controls;Language facilitation    Potential to Achieve Goals  Good    Potential Considerations  Ability to learn/carryover information       Patient will benefit from skilled therapeutic intervention in order to improve the following deficits and impairments:   Aphasia  Cognitive communication deficit    Problem List Patient Active Problem List   Diagnosis Date Noted  . DVT, lower extremity, distal, acute, right ([place]) 04/25/2018  . Acute lower UTI   . Atelectasis   . Hypoalbuminemia due to protein-calorie malnutrition ([place])    . Left temporal lobe hemorrhage ([place]) 04/15/2018  . Vascular headache   . Seizure prophylaxis   . Cerebral edema ([facility])   . Atrial fibrillation with rapid ventricular response ([place])   . Pneumonia   . Diabetes mellitus type 2 in nonobese (HCC)   . Benign essential HTN   . History of pancreatic cancer   . FUO (fever of unknown origin)   . Dysphagia, post-stroke   . Tachycardia   . Tachypnea   . Hypothyroidism   . Hypernatremia   . Hypokalemia   . Acute blood loss anemia   . Thrombocytopenia ([place])   . Intracranial hemorrhage ([place]) 04/05/2018  . Acute pancreatitis 06/11/2014  . Headache 06/11/2014  . 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 Obstructive jaundice 11/11/2012  . Biliary stricture 11/11/2012  . Diabetes mellitus ([place]) 11/10/2012  . HTN (hypertension) 11/10/2012  . Pancreatitis 11/10/2012  . Abdominal pain 11/10/2012    [facility] ,[place], [place]  07/24/2018, 5:31 PM  [facility] [address] [place], [place], [address] Phone: [phone]   Fax:  [phone]   Name: Hector Pugh MRN: [id number] Date of Birth: 1961/09/27

## 2018-07-24 NOTE — Patient Instructions (Signed)
  Please complete the assigned speech therapy homework prior to your next session and return it to the speech therapist at your next visit.  

## 2018-07-25 ENCOUNTER — Encounter: Payer: Managed Care, Other (non HMO) | Admitting: Occupational Therapy

## 2018-07-25 ENCOUNTER — Ambulatory Visit: Payer: Managed Care, Other (non HMO)

## 2018-07-25 DIAGNOSIS — R4701 Aphasia: Secondary | ICD-10-CM | POA: Diagnosis not present

## 2018-07-25 DIAGNOSIS — R41841 Cognitive communication deficit: Secondary | ICD-10-CM

## 2018-07-25 NOTE — Therapy (Signed)
Hector Pugh [address] [place], [place], [address] Phone: [phone]   Fax:  [phone]  Speech Language Pathology Treatment  Patient Details  Name: Hector Pugh MRN: [id number] Date of Birth: 1961-03-01 Referring Provider: Delice Lesch, MD   Encounter Date: 07/25/2018  End of Session - 07/25/18 1017    Visit Number  16    Number of Visits  25    Date for SLP Re-Evaluation  08/01/18    Authorization Time Period  05-10-18 to 11-09-18    Authorization - Visit Number  4    Authorization - Number of Visits  25    SLP Start Time  0935    SLP Stop Time   7209    SLP Time Calculation (min)  40 min    Activity Tolerance  Patient tolerated treatment well       Past Medical History:  Diagnosis Date  . 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 Cancer ([place])   . Diabetes mellitus ([place]) 11/10/2012  . Diabetes mellitus without complication (HCC)    diet controlled  . HTN (hypertension) 11/10/2012  . Hypertension   . 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 Pancreatic cancer ([place])   . Pancreatitis 11/10/2012  . Seasonal allergies     Past Surgical History:  Procedure Laterality Date  . circucision    . circumsision    . COLONOSCOPY N/A 04/04/2018   Procedure: COLONOSCOPY;  Surgeon: Leighton Ruff, MD;  [facility];  Service: Endoscopy;  Laterality: N/A;  . CRANIOTOMY Left 04/05/2018   Procedure: Left Temporal CRANIOTOMY HEMATOMA EVACUATION of Intracranial Hemorrhage;  Surgeon: Ashok Pall, MD;  Location: [facility];  Service: Neurosurgery;  Laterality: Left;  . ERCP  11/08/2012   Procedure: ENDOSCOPIC RETROGRADE CHOLANGIOPANCREATOGRAPHY (ERCP);  Surgeon: Jeryl Columbia, MD;  Location: [facility];  Service: Gastroenterology;  Laterality: N/A;  . EUS  11/08/2012   Procedure: UPPER ENDOSCOPIC ULTRASOUND (EUS) LINEAR;  Surgeon: Arta Silence, MD;  [facility];  Service:  Endoscopy;  Laterality: N/A;  . FINE NEEDLE ASPIRATION  11/08/2012   Procedure: FINE NEEDLE ASPIRATION (FNA) LINEAR;  Surgeon: Arta Silence, MD;  [facility];  Service: Endoscopy;  Laterality: N/A;  . HERNIA REPAIR     RIGHT  . LAPAROSCOPY  12/12/2012   Procedure: LAPAROSCOPY DIAGNOSTIC;  Surgeon: Stark Klein, MD;  [facility];  Service: General;  Laterality: N/A;  . Left Foot Surgery    . Left Foot Surgery    . VASECTOMY    . WHIPPLE PROCEDURE  12/12/2012   Procedure: WHIPPLE PROCEDURE;  Surgeon: Stark Klein, MD;  [facility];  Service: General;  Laterality: N/A;    There were no vitals filed for this visit.  Subjective Assessment - 07/25/18 0938    Subjective  Pt did not bring homework today.     Currently in Pain?  No/denies            ADULT SLP TREATMENT - 07/25/18 1010      General Information   Behavior/Cognition  Alert;Cooperative;Pleasant mood      Treatment Provided   Treatment provided  Cognitive-Linquistic      Cognitive-Linquistic Treatment   Treatment focused on  Aphasia    Skilled Treatment  SLP focused therapy session on pt's reading comprehension. Pt req'd consistent mod/max A with sentence level written material. (definitions). Pt could spell the words with rare errors, and knew the word 85% of  the time after spelling it, but frequently read other words with aphasic errors without awareness (e.g., read "activity" for "active", "various" for "variety", and "enter" for "entry"). Pt also req'd mod A for reasoning ability to think of all clues would be describing ONE thing.       Assessment / Recommendations / Plan   Plan  Continue with current plan of care      Progression Toward Goals   Progression toward goals  Progressing toward goals         SLP Short Term Goals - 07/18/18 0837      SLP SHORT TERM GOAL #1   Title  pt will generate sentence responses in min-mod complex language tasks 80% accuracy with min-mod A occasionally for  error awareness    Period  --   ot 9 total sessions, for all STGs   Status  Not Met      SLP SHORT TERM GOAL #2   Title  pt will read sentences of 8-9 words with error awareness at 90% over three sessions    Status  Not Met      SLP SHORT TERM GOAL #3   Title  pt will demo understanding of spoken sentence level stimuli with 80% success and occasional min A over two sessions    Status  Partially Met      SLP SHORT TERM GOAL #4   Title  pt will complete cognitive linguistic testing within first 4 therapy sessions    Period  --   or 5 total sessions   Status  Achieved      SLP SHORT TERM GOAL #5   Title  pt will complete a HEP for increasing accuracy of his verbal output with 90% success with rare min A for error awareness    Status  Not Met      SLP SHORT TERM GOAL #6   Title  Pt will selectively attend to structured cognitive lingustic tasks in mildly distracting environment with 85% on task and occasional min A for attention over 2 sessions    Status  Achieved       SLP Long Term Goals - 07/25/18 1018      SLP LONG TERM GOAL #1   Title  pt will complete a HEP for improving accuracy of his verbal output with 90% success over three sessions    Period  --   or 17 sessions   Status  Deferred   to work on cognitive linguistics     SLP [place] #2   Title  pt will participate in 6 minutes of simple-mod complex conversation appropriately, with occasional min A    Period  --   or 17 sessions   Status  Achieved      SLP LONG TERM GOAL #3   Title  pt will demo understanding of 8 minutes simple-mod complex conversation over three visits, with occasional min A    Period  --   or 17 visits   Status  Achieved      SLP LONG TERM GOAL #4   Title  pt will demo understanding of 10 minutes mod complex conversation over three visits, with modified independence    Baseline  07-11-18    Time  5    Period  Weeks   or 25 total visits   Status  On-going      SLP LONG TERM GOAL #5    Title  pt will demo awareness of verbal speech  errors by correcting 95% of errors in 10 minutes mod complex conversation over two sessions    Baseline  07-11-18    Time  5    Period  Weeks   or 25 visits   Status  On-going      SLP LONG TERM GOAL #6   Title  Pt will alternate attention between 2 simple cognitive linguistic tasks with 80% on each and occasional min A over 2 sessions   07-24-18   Time  5    Period  Weeks    Status  On-going      SLP LONG TERM GOAL #7   Title  Pt will utilize external aids for memory, schedule management, med management with occasional min A from family/caregivers over 2 sessions    Time  5    Period  Weeks    Status  On-going       Plan - 07/25/18 1018    Clinical Impression Statement  Pt cont to present today with 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 Pt's reading comprehension is a relative deficit area targeted again today, requiring consistent extra time in reading comprehension at the sentence level. He would cont to benefit from skilled ST targeting langauge skills (recpetive and expressive) in complex conversation, and from cognitive linguistic thearpy in order to maximize langauge and cognitive skills.    Speech Therapy Frequency  2x / week    Duration  --   12 weeks/25 total visits   Treatment/Interventions  Internal/external aids;Patient/family education;Compensatory strategies;SLP instruction and feedback;Functional tasks;Cognitive reorganization;Cueing hierarchy;Environmental controls;Language facilitation    Potential to Achieve Goals  Good    Potential Considerations  Ability to learn/carryover information       Patient will benefit from skilled therapeutic intervention in order to improve the following deficits and impairments:   Aphasia  Cognitive communication deficit    Problem List Patient Active Problem List   Diagnosis Date Noted  . DVT, lower extremity, distal, acute, right ([place]) 04/25/2018   . Acute lower UTI   . Atelectasis   . Hypoalbuminemia due to protein-calorie malnutrition ([place])   . Left temporal lobe hemorrhage ([place]) 04/15/2018  . Vascular headache   . Seizure prophylaxis   . Cerebral edema (HCC)   . Atrial fibrillation with rapid ventricular response ([place])   . Pneumonia   . Diabetes mellitus type 2 in nonobese (HCC)   . Benign essential HTN   . History of pancreatic cancer   . FUO (fever of unknown origin)   . Dysphagia, post-stroke   . Tachycardia   . Tachypnea   . Hypothyroidism   . Hypernatremia   . Hypokalemia   . Acute blood loss anemia   . Thrombocytopenia ([place])   . Intracranial hemorrhage ([place]) 04/05/2018  . Acute pancreatitis 06/11/2014  . Headache 06/11/2014  . 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 Obstructive jaundice 11/11/2012  . Biliary stricture 11/11/2012  . Diabetes mellitus ([place]) 11/10/2012  . HTN (hypertension) 11/10/2012  . Pancreatitis 11/10/2012  . Abdominal pain 11/10/2012    [facility] ,MS, [place]  07/25/2018, 10:20 AM  [facility] [address] [place], [place], [address] Phone: [phone]   Fax:  [phone]   Name: Hector Pugh MRN: [id number] Date of Birth: 1961-07-07

## 2018-07-29 ENCOUNTER — Ambulatory Visit: Payer: Managed Care, Other (non HMO)

## 2018-07-29 ENCOUNTER — Encounter: Payer: Managed Care, Other (non HMO) | Admitting: Occupational Therapy

## 2018-07-29 DIAGNOSIS — R4701 Aphasia: Secondary | ICD-10-CM | POA: Diagnosis not present

## 2018-07-29 DIAGNOSIS — R41841 Cognitive communication deficit: Secondary | ICD-10-CM

## 2018-07-29 NOTE — Patient Instructions (Signed)
  Please complete the assigned speech therapy homework prior to your next session and return it to the speech therapist at your next visit.  

## 2018-07-29 NOTE — Therapy (Signed)
[facility] [address] [place], [place], [address] Phone: [phone]   Fax:  [phone]  Speech Language Pathology Treatment  Patient Details  Name: Hector Pugh MRN: [id number] Date of Birth: 04-30-1961 Referring Provider: Delice Lesch, MD   Encounter Date: 07/29/2018  End of Session - 07/29/18 1424    Visit Number  17    Number of Visits  25    Date for SLP Re-Evaluation  08/01/18    Authorization - Visit Number  34    Authorization - Number of Visits  25    SLP Start Time  5427    SLP Stop Time   1400    SLP Time Calculation (min)  42 min    Activity Tolerance  Patient tolerated treatment well       Past Medical History:  Diagnosis Date  . 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 Cancer ([place])   . Diabetes mellitus ([place]) 11/10/2012  . Diabetes mellitus without complication (HCC)    diet controlled  . HTN (hypertension) 11/10/2012  . Hypertension   . 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 Pancreatic cancer ([place])   . Pancreatitis 11/10/2012  . Seasonal allergies     Past Surgical History:  Procedure Laterality Date  . circucision    . circumsision    . COLONOSCOPY N/A 04/04/2018   Procedure: COLONOSCOPY;  Surgeon: Leighton Ruff, MD;  [facility];  Service: Endoscopy;  Laterality: N/A;  . CRANIOTOMY Left 04/05/2018   Procedure: Left Temporal CRANIOTOMY HEMATOMA EVACUATION of Intracranial Hemorrhage;  Surgeon: Ashok Pall, MD;  Location: [facility];  Service: Neurosurgery;  Laterality: Left;  . ERCP  11/08/2012   Procedure: ENDOSCOPIC RETROGRADE CHOLANGIOPANCREATOGRAPHY (ERCP);  Surgeon: Jeryl Columbia, MD;  Location[facility];  Service: Gastroenterology;  Laterality: N/A;  . EUS  11/08/2012   Procedure: UPPER ENDOSCOPIC ULTRASOUND (EUS) LINEAR;  Surgeon: Arta Silence, MD;  [facility];  Service: Endoscopy;  Laterality: N/A;  . FINE NEEDLE  ASPIRATION  11/08/2012   Procedure: FINE NEEDLE ASPIRATION (FNA) LINEAR;  Surgeon: Arta Silence, MD;  [facility];  Service: Endoscopy;  Laterality: N/A;  . HERNIA REPAIR     RIGHT  . LAPAROSCOPY  12/12/2012   Procedure: LAPAROSCOPY DIAGNOSTIC;  Surgeon: Stark Klein, MD;  [facility];  Service: General;  Laterality: N/A;  . Left Foot Surgery    . Left Foot Surgery    . VASECTOMY    . WHIPPLE PROCEDURE  12/12/2012   Procedure: WHIPPLE PROCEDURE;  Surgeon: Stark Klein, MD;  [facility];  Service: General;  Laterality: N/A;    There were no vitals filed for this visit.  Subjective Assessment - 07/29/18 1332    Subjective  Pt brought homework; did half of it.     Currently in Pain?  No/denies            ADULT SLP TREATMENT - 07/29/18 1342      General Information   Behavior/Cognition  Alert;Cooperative;Pleasant mood      Treatment Provided   Treatment provided  Cognitive-Linquistic      Cognitive-Linquistic Treatment   Treatment focused on  Aphasia    Skilled Treatment  Reading comprehension - pt with aphasic verbal responses while reading simple sentences. Homework was checked and pt with 75% success, without any awareness of errors upon second reading with SLP. Extra time necessary for SLP to explain where his error was made.  SLP provided pt with 12 page packet of reading homework at phrase and sentence levels.       Assessment / Recommendations / Plan   Plan  Continue with current plan of care      Progression Toward Goals   Progression toward goals  Progressing toward goals         SLP Short Term Goals - 07/18/18 0837      SLP SHORT TERM GOAL #1   Title  pt will generate sentence responses in min-mod complex language tasks 80% accuracy with min-mod A occasionally for error awareness    Period  --   ot 9 total sessions, for all STGs   Status  Not Met      SLP SHORT TERM GOAL #2   Title  pt will read sentences of 8-9 words with error  awareness at 90% over three sessions    Status  Not Met      SLP SHORT TERM GOAL #3   Title  pt will demo understanding of spoken sentence level stimuli with 80% success and occasional min A over two sessions    Status  Partially Met      SLP SHORT TERM GOAL #4   Title  pt will complete cognitive linguistic testing within first 4 therapy sessions    Period  --   or 5 total sessions   Status  Achieved      SLP SHORT TERM GOAL #5   Title  pt will complete a HEP for increasing accuracy of his verbal output with 90% success with rare min A for error awareness    Status  Not Met      SLP SHORT TERM GOAL #6   Title  Pt will selectively attend to structured cognitive lingustic tasks in mildly distracting environment with 85% on task and occasional min A for attention over 2 sessions    Status  Achieved       SLP Long Term Goals - 07/29/18 1425      SLP LONG TERM GOAL #1   Title  pt will complete a HEP for improving accuracy of his verbal output with 90% success over three sessions    Period  --   or 17 sessions   Status  Deferred   to work on cognitive linguistics     SLP LONG TERM GOAL #2   Title  pt will participate in 6 minutes of simple-mod complex conversation appropriately, with occasional min A    Period  --   or 17 sessions   Status  Achieved      SLP LONG TERM GOAL #3   Title  pt will demo understanding of 8 minutes simple-mod complex conversation over three visits, with occasional min A    Period  --   or 17 visits   Status  Achieved      SLP LONG TERM GOAL #4   Title  pt will demo understanding of 10 minutes mod complex conversation over three visits, with modified independence    Baseline  07-11-18    Time  5    Period  Weeks   or 25 total visits   Status  On-going      SLP LONG TERM GOAL #5   Title  pt will demo awareness of verbal speech errors by correcting 95% of errors in 10 minutes mod complex conversation over two sessions    Baseline  07-11-18    Time   5  Period  Weeks   or 25 visits   Status  On-going      SLP LONG TERM GOAL #6   Title  Pt will alternate attention between 2 simple cognitive linguistic tasks with 80% on each and occasional min A over 2 sessions   07-24-18   Time  5    Period  Weeks    Status  On-going      SLP LONG TERM GOAL #7   Title  Pt will utilize external aids for memory, schedule management, med management with occasional min A from family/caregivers over 2 sessions    Time  5    Period  Weeks    Status  On-going       Plan - 07/29/18 1424    Clinical Impression Statement  Pt cont to present today with 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 Pt's reading comprehension is a relative deficit area targeted again today, requiring consistent extra time in reading comprehension at the sentence level. He would cont to benefit from skilled ST targeting langauge skills (recpetive and expressive) in complex conversation, and from cognitive linguistic thearpy in order to maximize langauge and cognitive skills.    Speech Therapy Frequency  2x / week    Duration  --   12 weeks/25 total visits   Treatment/Interventions  Internal/external aids;Patient/family education;Compensatory strategies;SLP instruction and feedback;Functional tasks;Cognitive reorganization;Cueing hierarchy;Environmental controls;Language facilitation    Potential to Achieve Goals  Good    Potential Considerations  Ability to learn/carryover information       Patient will benefit from skilled therapeutic intervention in order to improve the following deficits and impairments:   Aphasia  Cognitive communication deficit    Problem List Patient Active Problem List   Diagnosis Date Noted  . DVT, lower extremity, distal, acute, right ([place]) 04/25/2018  . Acute lower UTI   . Atelectasis   . Hypoalbuminemia due to protein-calorie malnutrition ([place])   . Left temporal lobe hemorrhage ([place]) 04/15/2018  . Vascular headache    . Seizure prophylaxis   . Cerebral edema ([facility])   . Atrial fibrillation with rapid ventricular response ([place])   . Pneumonia   . Diabetes mellitus type 2 in nonobese (HCC)   . Benign essential HTN   . History of pancreatic cancer   . FUO (fever of unknown origin)   . Dysphagia, post-stroke   . Tachycardia   . Tachypnea   . Hypothyroidism   . Hypernatremia   . Hypokalemia   . Acute blood loss anemia   . Thrombocytopenia ([place])   . Intracranial hemorrhage ([place]) 04/05/2018  . Acute pancreatitis 06/11/2014  . Headache 06/11/2014  . 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 Obstructive jaundice 11/11/2012  . Biliary stricture 11/11/2012  . Diabetes mellitus ([place]) 11/10/2012  . HTN (hypertension) 11/10/2012  . Pancreatitis 11/10/2012  . Abdominal pain 11/10/2012    [facility] ,[place], [place]  07/29/2018, 2:26 PM  [facility] [address] [place], [place], [address] Phone: [phone]   Fax:  [phone]   Name: Jerick Khachatryan MRN: [id number] Date of Birth: 1961/02/01

## 2018-08-01 ENCOUNTER — Encounter: Payer: Managed Care, Other (non HMO) | Admitting: Occupational Therapy

## 2018-08-01 ENCOUNTER — Ambulatory Visit: Payer: Managed Care, Other (non HMO)

## 2018-08-01 DIAGNOSIS — R4701 Aphasia: Secondary | ICD-10-CM

## 2018-08-01 DIAGNOSIS — R41841 Cognitive communication deficit: Secondary | ICD-10-CM

## 2018-08-01 NOTE — Therapy (Signed)
[facility] [address] [place], [place], [address] Phone: [phone]   Fax:  [phone]  Speech Language Pathology Treatment  Patient Details  Name: Hector Pugh MRN: [id number] Date of Birth: 02/15/1961 Referring Provider: Delice Lesch, MD   Encounter Date: 08/01/2018  End of Session - 08/01/18 1646    Visit Number  18    Number of Visits  25    Date for SLP Re-Evaluation  08/01/18    Authorization - Visit Number  18    Authorization - Number of Visits  25    SLP Start Time  6606    SLP Stop Time   1625    SLP Time Calculation (min)  52 min    Activity Tolerance  Patient tolerated treatment well       Past Medical History:  Diagnosis Date  . 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 Cancer ([place])   . Diabetes mellitus ([place]) 11/10/2012  . Diabetes mellitus without complication (HCC)    diet controlled  . HTN (hypertension) 11/10/2012  . Hypertension   . 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 Pancreatic cancer ([place])   . Pancreatitis 11/10/2012  . Seasonal allergies     Past Surgical History:  Procedure Laterality Date  . circucision    . circumsision    . COLONOSCOPY N/A 04/04/2018   Procedure: COLONOSCOPY;  Surgeon: Leighton Ruff, MD;  [facility];  Service: Endoscopy;  Laterality: N/A;  . CRANIOTOMY Left 04/05/2018   Procedure: Left Temporal CRANIOTOMY HEMATOMA EVACUATION of Intracranial Hemorrhage;  Surgeon: Ashok Pall, MD;  Location: [facility];  Service: Neurosurgery;  Laterality: Left;  . ERCP  11/08/2012   Procedure: ENDOSCOPIC RETROGRADE CHOLANGIOPANCREATOGRAPHY (ERCP);  Surgeon: Jeryl Columbia, MD;  Location: [facility];  Service: Gastroenterology;  Laterality: N/A;  . EUS  11/08/2012   Procedure: UPPER ENDOSCOPIC ULTRASOUND (EUS) LINEAR;  Surgeon: Arta Silence, MD;  [facility];  Service: Endoscopy;  Laterality: N/A;  . FINE NEEDLE  ASPIRATION  11/08/2012   Procedure: FINE NEEDLE ASPIRATION (FNA) LINEAR;  Surgeon: Arta Silence, MD;  [facility];  Service: Endoscopy;  Laterality: N/A;  . HERNIA REPAIR     RIGHT  . LAPAROSCOPY  12/12/2012   Procedure: LAPAROSCOPY DIAGNOSTIC;  Surgeon: Stark Klein, MD;  [facility];  Service: General;  Laterality: N/A;  . Left Foot Surgery    . Left Foot Surgery    . VASECTOMY    . WHIPPLE PROCEDURE  12/12/2012   Procedure: WHIPPLE PROCEDURE;  Surgeon: Stark Klein, MD;  [facility];  Service: General;  Laterality: N/A;    There were no vitals filed for this visit.  Subjective Assessment - 08/01/18 1529    Subjective  "I doing some, almost all of this work. I might have to take more timing on it." (re: homework)            ADULT SLP TREATMENT - 08/01/18 1537      General Information   Behavior/Cognition  Alert;Cooperative;Pleasant mood      Treatment Provided   Treatment provided  Cognitive-Linquistic      Cognitive-Linquistic Treatment   Treatment focused on  Aphasia    Skilled Treatment  SLP reviewed homework with pt - good success except for finding words associated with a target word - 80% success with min A - this improved to 100% success with min-mod A usually. Again, pt spelled words and then  verbalized what they were with 85% success, some aphasic errors noted. SLP printed off enabling text to speech for iphone X (pt's phone). SLP then discussed progress with pt and family, encouraging pt family to meet with colleagues or talk with colleagues about job description (sometime after d/c from [employer]) to assess whether or not pt would be able to return to work sometime in the future.       Assessment / Recommendations / Plan   Plan  Continue with current plan of care      Progression Toward Goals   Progression toward goals  Progressing toward goals       SLP Education - 08/01/18 1646    Education Details  text to speech enabling on iphone X     Person(s) Educated  Patient;Child(ren);Spouse    Methods  Explanation;Handout    Comprehension  Verbalized understanding       SLP Short Term Goals - 07/18/18 0837      SLP SHORT TERM GOAL #1   Title  pt will generate sentence responses in min-mod complex language tasks 80% accuracy with min-mod A occasionally for error awareness    Period  --   ot 9 total sessions, for all STGs   Status  Not Met      SLP SHORT TERM GOAL #2   Title  pt will read sentences of 8-9 words with error awareness at 90% over three sessions    Status  Not Met      SLP SHORT TERM GOAL #3   Title  pt will demo understanding of spoken sentence level stimuli with 80% success and occasional min A over two sessions    Status  Partially Met      SLP SHORT TERM GOAL #4   Title  pt will complete cognitive linguistic testing within first 4 therapy sessions    Period  --   or 5 total sessions   Status  Achieved      SLP SHORT TERM GOAL #5   Title  pt will complete a HEP for increasing accuracy of his verbal output with 90% success with rare min A for error awareness    Status  Not Met      SLP SHORT TERM GOAL #6   Title  Pt will selectively attend to structured cognitive lingustic tasks in mildly distracting environment with 85% on task and occasional min A for attention over 2 sessions    Status  Achieved       SLP Long Term Goals - 08/01/18 1649      SLP LONG TERM GOAL #1   Title  pt will complete a HEP for improving accuracy of his verbal output with 90% success over three sessions    Period  --   or 17 sessions   Status  Deferred   to work on cognitive linguistics     SLP [place] #2   Title  pt will participate in 6 minutes of simple-mod complex conversation appropriately, with occasional min A    Period  --   or 17 sessions   Status  Achieved      SLP LONG TERM GOAL #3   Title  pt will demo understanding of 8 minutes simple-mod complex conversation over three visits, with occasional min  A    Period  --   or 17 visits   Status  Achieved      SLP LONG TERM GOAL #4   Title  pt will  demo understanding of 10 minutes mod complex conversation over three visits, with modified independence    Baseline  07-11-18    Time  4    Period  Weeks   or 25 total visits   Status  On-going      SLP LONG TERM GOAL #5   Title  pt will demo awareness of verbal speech errors by correcting 95% of errors in 10 minutes mod complex conversation over two sessions    Baseline  07-11-18    Time  4    Period  Weeks   or 25 visits   Status  On-going      SLP LONG TERM GOAL #6   Title  Pt will alternate attention between 2 simple cognitive linguistic tasks with 80% on each and occasional min A over 2 sessions   07-24-18   Time  4    Period  Weeks    Status  On-going      SLP LONG TERM GOAL #7   Title  Pt will utilize external aids for memory, schedule management, med management with occasional min A from family/caregivers over 2 sessions    Time  4    Period  Weeks    Status  On-going       Plan - 08/01/18 1647    Clinical Impression Statement  Pt cont to present today with 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 Pt's reading comprehension is a relative deficit area targeted again today, requiring consistent extra time in reading comprehension at the sentence level. Pt continues to exhibit decr'd higher level cognitive linguistic skills. See pt's goals for more details on specific goals. He would cont to benefit from skilled ST targeting langauge skills (recpetive and expressive) in complex conversation, and from cognitive linguistic thearpy in order to maximize langauge and cognitive skills.    Speech Therapy Frequency  2x / week    Duration  --   12 weeks/25 total visits   Treatment/Interventions  Internal/external aids;Patient/family education;Compensatory strategies;SLP instruction and feedback;Functional tasks;Cognitive reorganization;Cueing hierarchy;Environmental  controls;Language facilitation    Potential to Achieve Goals  Good    Potential Considerations  Ability to learn/carryover information       Patient will benefit from skilled therapeutic intervention in order to improve the following deficits and impairments:   Aphasia - Plan: SLP plan of care cert/re-cert  Cognitive communication deficit - Plan: SLP plan of care cert/re-cert    Problem List Patient Active Problem List   Diagnosis Date Noted  . DVT, lower extremity, distal, acute, right ([facility]) 04/25/2018  . Acute lower UTI   . Atelectasis   . Hypoalbuminemia due to protein-calorie malnutrition ([facility])   . Left temporal lobe hemorrhage ([facility]) 04/15/2018  . Vascular headache   . Seizure prophylaxis   . Cerebral edema (HCC)   . Atrial fibrillation with rapid ventricular response ([place] [place])   . Pneumonia   . Diabetes mellitus type 2 in nonobese (HCC)   . Benign essential HTN   . History of pancreatic cancer   . FUO (fever of unknown origin)   . Dysphagia, post-stroke   . Tachycardia   . Tachypnea   . Hypothyroidism   . Hypernatremia   . Hypokalemia   . Acute blood loss anemia   . Thrombocytopenia ([place])   . Intracranial hemorrhage ([place]) 04/05/2018  . Acute pancreatitis 06/11/2014  . Headache 06/11/2014  . 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 Obstructive jaundice 11/11/2012  . Biliary  stricture 11/11/2012  . Diabetes mellitus ([place]) 11/10/2012  . HTN (hypertension) 11/10/2012  . Pancreatitis 11/10/2012  . Abdominal pain 11/10/2012    [facility] ,[place], [place]  08/01/2018, 4:51 PM  [facility] [address] [place], [place], [address] Phone: [phone]   Fax:  [phone]   Name: Hector Pugh MRN: [id number] Date of Birth: 29-May-1961

## 2018-08-01 NOTE — Patient Instructions (Addendum)
   I suggest you enable text to speech (siri reading the text out loud) on your iPhone X at this time.  [email or web address]

## 2018-08-15 ENCOUNTER — Ambulatory Visit: Payer: Managed Care, Other (non HMO) | Attending: Internal Medicine

## 2018-08-15 ENCOUNTER — Other Ambulatory Visit: Payer: Self-pay | Admitting: Emergency Medicine

## 2018-08-15 DIAGNOSIS — R41841 Cognitive communication deficit: Secondary | ICD-10-CM | POA: Insufficient documentation

## 2018-08-15 DIAGNOSIS — R4701 Aphasia: Secondary | ICD-10-CM | POA: Insufficient documentation

## 2018-08-15 DIAGNOSIS — C241 Malignant neoplasm of ampulla of Vater: Secondary | ICD-10-CM

## 2018-08-15 NOTE — Therapy (Signed)
[facility] [address] [place], [place], [address] Phone: [phone]   Fax:  [phone]  Speech Language Pathology Treatment  Patient Details  Name: Hector Pugh MRN: [id number] Date of Birth: Mar 22, 1961 Referring Provider: Delice Lesch, MD   Encounter Date: 08/15/2018  End of Session - 08/15/18 1243    Visit Number  19    Number of Visits  25    Date for SLP Re-Evaluation  09/06/18    Authorization Type  pre-auth - [employer]    Authorization Time Period  05-10-18 to 11-09-18    Authorization - Visit Number  89    Authorization - Number of Visits  25    SLP Start Time  0935    SLP Stop Time   1016    SLP Time Calculation (min)  41 min    Activity Tolerance  Patient tolerated treatment well       Past Medical History:  Diagnosis Date  . 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 Cancer ([place])   . Diabetes mellitus ([place]) 11/10/2012  . Diabetes mellitus without complication (HCC)    diet controlled  . HTN (hypertension) 11/10/2012  . Hypertension   . 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 Pancreatic cancer ([place])   . Pancreatitis 11/10/2012  . Seasonal allergies     Past Surgical History:  Procedure Laterality Date  . circucision    . circumsision    . COLONOSCOPY N/A 04/04/2018   Procedure: COLONOSCOPY;  Surgeon: Leighton Ruff, MD;  [facility];  Service: Endoscopy;  Laterality: N/A;  . CRANIOTOMY Left 04/05/2018   Procedure: Left Temporal CRANIOTOMY HEMATOMA EVACUATION of Intracranial Hemorrhage;  Surgeon: Ashok Pall, MD;  Location: [facility];  Service: Neurosurgery;  Laterality: Left;  . ERCP  11/08/2012   Procedure: ENDOSCOPIC RETROGRADE CHOLANGIOPANCREATOGRAPHY (ERCP);  Surgeon: Jeryl Columbia, MD;  Location: [facility];  Service: Gastroenterology;  Laterality: N/A;  . EUS  11/08/2012   Procedure: UPPER ENDOSCOPIC ULTRASOUND (EUS) LINEAR;  Surgeon: Arta Silence,  MD;  [facility];  Service: Endoscopy;  Laterality: N/A;  . FINE NEEDLE ASPIRATION  11/08/2012   Procedure: FINE NEEDLE ASPIRATION (FNA) LINEAR;  Surgeon: Arta Silence, MD;  [facility];  Service: Endoscopy;  Laterality: N/A;  . HERNIA REPAIR     RIGHT  . LAPAROSCOPY  12/12/2012   Procedure: LAPAROSCOPY DIAGNOSTIC;  Surgeon: Stark Klein, MD;  [facility];  Service: General;  Laterality: N/A;  . Left Foot Surgery    . Left Foot Surgery    . VASECTOMY    . WHIPPLE PROCEDURE  12/12/2012   Procedure: WHIPPLE PROCEDURE;  Surgeon: Stark Klein, MD;  [facility];  Service: General;  Laterality: N/A;    There were no vitals filed for this visit.  Subjective Assessment - 08/15/18 0950    Subjective  Pt with homework completed.     Currently in Pain?  No/denies            ADULT SLP TREATMENT - 08/15/18 0950      General Information   Behavior/Cognition  Alert;Cooperative;Pleasant mood      Treatment Provided   Treatment provided  Cognitive-Linquistic      Cognitive-Linquistic Treatment   Treatment focused on  Aphasia    Skilled Treatment  SLP engaged pt in simple to mod complex conversation for 11 minutes - pt demonstrated appropriate receptive language and pt was functional with errors in pronouns  persistant. SLP corrected pt's homework for reading comprehension - 88% without awareness of errors - ? culture differences. SLP reviewed he/she and him/her with pt and worked on written worksheet with these pronouns. Pt with 100% success. SLP provided pt homwork for pronouns.      Assessment / Recommendations / Plan   Plan  Continue with current plan of care      Progression Toward Goals   Progression toward goals  Progressing toward goals         SLP Short Term Goals - 07/18/18 0837      SLP SHORT TERM GOAL #1   Title  pt will generate sentence responses in min-mod complex language tasks 80% accuracy with min-mod A occasionally for error awareness     Period  --   ot 9 total sessions, for all STGs   Status  Not Met      SLP SHORT TERM GOAL #2   Title  pt will read sentences of 8-9 words with error awareness at 90% over three sessions    Status  Not Met      SLP SHORT TERM GOAL #3   Title  pt will demo understanding of spoken sentence level stimuli with 80% success and occasional min A over two sessions    Status  Partially Met      SLP SHORT TERM GOAL #4   Title  pt will complete cognitive linguistic testing within first 4 therapy sessions    Period  --   or 5 total sessions   Status  Achieved      SLP SHORT TERM GOAL #5   Title  pt will complete a HEP for increasing accuracy of his verbal output with 90% success with rare min A for error awareness    Status  Not Met      SLP SHORT TERM GOAL #6   Title  Pt will selectively attend to structured cognitive lingustic tasks in mildly distracting environment with 85% on task and occasional min A for attention over 2 sessions    Status  Achieved       SLP Long Term Goals - 08/15/18 1247      SLP LONG TERM GOAL #1   Title  pt will complete a HEP for improving accuracy of his verbal output with 90% success over three sessions    Period  --   or 17 sessions   Status  Deferred   to work on cognitive linguistics     SLP [place] #2   Title  pt will participate in 6 minutes of simple-mod complex conversation appropriately, with occasional min A    Period  --   or 17 sessions   Status  Achieved      SLP LONG TERM GOAL #3   Title  pt will demo understanding of 8 minutes simple-mod complex conversation over three visits, with occasional min A    Period  --   or 17 visits   Status  Achieved      SLP LONG TERM GOAL #4   Title  pt will demo understanding of 10 minutes mod complex conversation over three visits, with modified independence    Baseline  07-11-18, 08-15-18    Time  3    Period  Weeks   or 25 total visits   Status  On-going      SLP LONG TERM GOAL #5    Title  pt will demo awareness of verbal speech errors by  correcting 95% of errors in 10 minutes mod complex conversation over two sessions    Baseline  07-11-18    Time  3    Period  Weeks   or 25 visits   Status  On-going      SLP LONG TERM GOAL #6   Title  Pt will alternate attention between 2 simple cognitive linguistic tasks with 80% on each and occasional min A over 2 sessions   07-24-18   Time  3    Period  Weeks    Status  On-going      SLP LONG TERM GOAL #7   Title  Pt will utilize external aids for memory, schedule management, med management with occasional min A from family/caregivers over 2 sessions    Time  3    Period  Weeks    Status  On-going       Plan - 08/15/18 1244    Clinical Impression Statement  Pt cont to present today with 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 Pt's reading comprehension is a relative deficit area targeted again today, requiring consistent extra time in reading comprehension at the sentence level. Pt continues to exhibit decr'd higher level cognitive linguistic skills. See pt's goals for more details on specific goals. He would cont to benefit from skilled ST targeting langauge skills (recpetive and expressive) in complex conversation, and from cognitive linguistic thearpy in order to maximize langauge and cognitive skills.    Speech Therapy Frequency  2x / week    Duration  --   12 weeks or 25 total visits   Treatment/Interventions  Internal/external aids;Patient/family education;Compensatory strategies;SLP instruction and feedback;Functional tasks;Cognitive reorganization;Cueing hierarchy;Environmental controls;Language facilitation    Potential to Achieve Goals  Good    Potential Considerations  Ability to learn/carryover information       Patient will benefit from skilled therapeutic intervention in order to improve the following deficits and impairments:   Aphasia  Cognitive communication  deficit    Problem List Patient Active Problem List   Diagnosis Date Noted  . DVT, lower extremity, distal, acute, right ([facility]) 04/25/2018  . Acute lower UTI   . Atelectasis   . Hypoalbuminemia due to protein-calorie malnutrition ([place])   . Left temporal lobe hemorrhage ([place]) 04/15/2018  . Vascular headache   . Seizure prophylaxis   . Cerebral edema ([facility])   . Atrial fibrillation with rapid ventricular response ([place])   . Pneumonia   . Diabetes mellitus type 2 in nonobese (HCC)   . Benign essential HTN   . History of pancreatic cancer   . FUO (fever of unknown origin)   . Dysphagia, post-stroke   . Tachycardia   . Tachypnea   . Hypothyroidism   . Hypernatremia   . Hypokalemia   . Acute blood loss anemia   . Thrombocytopenia ([place])   . Intracranial hemorrhage ([place]) 04/05/2018  . Acute pancreatitis 06/11/2014  . Headache 06/11/2014  . 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 Obstructive jaundice 11/11/2012  . Biliary stricture 11/11/2012  . Diabetes mellitus ([place]) 11/10/2012  . HTN (hypertension) 11/10/2012  . Pancreatitis 11/10/2012  . Abdominal pain 11/10/2012    [facility] ,[place], [place]  08/15/2018, 12:55 PM  [facility] [address] [place], [place], [address] Phone: [phone]   Fax:  [phone]   Name: Hector Pugh MRN: [id number] Date of Birth: [birth year]

## 2018-08-15 NOTE — Patient Instructions (Signed)
  Please complete the assigned speech therapy homework prior to your next session and return it to the speech therapist at your next visit.  

## 2018-08-16 ENCOUNTER — Ambulatory Visit: Payer: Managed Care, Other (non HMO)

## 2018-08-16 DIAGNOSIS — R41841 Cognitive communication deficit: Secondary | ICD-10-CM

## 2018-08-16 DIAGNOSIS — R4701 Aphasia: Secondary | ICD-10-CM | POA: Diagnosis not present

## 2018-08-16 NOTE — Therapy (Signed)
[facility] [address] [place], [place], [address] Phone: [phone]   Fax:  [phone]  Speech Language Pathology Treatment  Patient Details  Name: Hector Pugh MRN: [id number] Date of Birth: Aug 21, 1961 Referring Provider: Delice Lesch, MD   Encounter Date: 08/16/2018  End of Session - 08/16/18 1350    Visit Number  20    Number of Visits  25    Date for SLP Re-Evaluation  09/06/18    Authorization Type  pre-auth - [employer]    Authorization Time Period  05-10-18 to 11-09-18    Authorization - Visit Number  69    Authorization - Number of Visits  30    SLP Start Time  0803    SLP Stop Time   0845    SLP Time Calculation (min)  42 min    Activity Tolerance  Patient tolerated treatment well       Past Medical History:  Diagnosis Date  . 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 Cancer ([place])   . Diabetes mellitus ([place]) 11/10/2012  . Diabetes mellitus without complication (HCC)    diet controlled  . HTN (hypertension) 11/10/2012  . Hypertension   . 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 Pancreatic cancer ([place])   . Pancreatitis 11/10/2012  . Seasonal allergies     Past Surgical History:  Procedure Laterality Date  . circucision    . circumsision    . COLONOSCOPY N/A 04/04/2018   Procedure: COLONOSCOPY;  Surgeon: Leighton Ruff, MD;  [facility];  Service: Endoscopy;  Laterality: N/A;  . CRANIOTOMY Left 04/05/2018   Procedure: Left Temporal CRANIOTOMY HEMATOMA EVACUATION of Intracranial Hemorrhage;  Surgeon: Ashok Pall, MD;  Location: [facility];  Service: Neurosurgery;  Laterality: Left;  . ERCP  11/08/2012   Procedure: ENDOSCOPIC RETROGRADE CHOLANGIOPANCREATOGRAPHY (ERCP);  Surgeon: Jeryl Columbia, MD;  Location: [facility];  Service: Gastroenterology;  Laterality: N/A;  . EUS  11/08/2012   Procedure: UPPER ENDOSCOPIC ULTRASOUND (EUS) LINEAR;  Surgeon: Arta Silence,  MD;  [facility];  Service: Endoscopy;  Laterality: N/A;  . FINE NEEDLE ASPIRATION  11/08/2012   Procedure: FINE NEEDLE ASPIRATION (FNA) LINEAR;  Surgeon: Arta Silence, MD;  [facility];  Service: Endoscopy;  Laterality: N/A;  . HERNIA REPAIR     RIGHT  . LAPAROSCOPY  12/12/2012   Procedure: LAPAROSCOPY DIAGNOSTIC;  Surgeon: Stark Klein, MD;  [facility];  Service: General;  Laterality: N/A;  . Left Foot Surgery    . Left Foot Surgery    . VASECTOMY    . WHIPPLE PROCEDURE  12/12/2012   Procedure: WHIPPLE PROCEDURE;  Surgeon: Stark Klein, MD;  [facility];  Service: General;  Laterality: N/A;    There were no vitals filed for this visit.  Subjective Assessment - 08/16/18 0841    Subjective  Pt completed homework - put "his" in for answers, and "his" not an option for a response.            ADULT SLP TREATMENT - 08/16/18 1341      General Information   Behavior/Cognition  Alert;Cooperative;Pleasant mood;Requires cueing      Treatment Provided   Treatment provided  Cognitive-Linquistic      Cognitive-Linquistic Treatment   Treatment focused on  Aphasia    Skilled Treatment  SLP reviewed pt's homework with him today and pt had written "his" for some answers without awareness - "his" was not  one of the four options provided by SLP for answers. SLP educated pt on subject/object in [place] and after examples pt success started to improve however pt still with errors he was unaware of, rarely.       Assessment / Recommendations / Plan   Plan  Continue with current plan of care      Progression Toward Goals   Progression toward goals  Progressing toward goals       SLP Education - 08/16/18 1349    Education Details  pronouns in [place] (subject vs. object)    Person(s) Educated  Patient    Methods  Explanation;Demonstration    Comprehension  Verbalized understanding;Need further instruction       SLP Short Term Goals - 07/18/18 0837       SLP SHORT TERM GOAL #1   Title  pt will generate sentence responses in min-mod complex language tasks 80% accuracy with min-mod A occasionally for error awareness    Period  --   ot 9 total sessions, for all STGs   Status  Not Met      SLP SHORT TERM GOAL #2   Title  pt will read sentences of 8-9 words with error awareness at 90% over three sessions    Status  Not Met      SLP SHORT TERM GOAL #3   Title  pt will demo understanding of spoken sentence level stimuli with 80% success and occasional min A over two sessions    Status  Partially Met      SLP SHORT TERM GOAL #4   Title  pt will complete cognitive linguistic testing within first 4 therapy sessions    Period  --   or 5 total sessions   Status  Achieved      SLP SHORT TERM GOAL #5   Title  pt will complete a HEP for increasing accuracy of his verbal output with 90% success with rare min A for error awareness    Status  Not Met      SLP SHORT TERM GOAL #6   Title  Pt will selectively attend to structured cognitive lingustic tasks in mildly distracting environment with 85% on task and occasional min A for attention over 2 sessions    Status  Achieved       SLP Long Term Goals - 08/16/18 1352      SLP LONG TERM GOAL #1   Title  pt will complete a HEP for improving accuracy of his verbal output with 90% success over three sessions    Period  --   or 17 sessions   Status  Deferred   to work on cognitive linguistics     SLP [place] #2   Title  pt will participate in 6 minutes of simple-mod complex conversation appropriately, with occasional min A    Period  --   or 17 sessions   Status  Achieved      SLP LONG TERM GOAL #3   Title  pt will demo understanding of 8 minutes simple-mod complex conversation over three visits, with occasional min A    Period  --   or 17 visits   Status  Achieved      SLP LONG TERM GOAL #4   Title  pt will demo understanding of 10 minutes mod complex conversation over three visits,  with modified independence    Period  --   or 25 total visits   Status  Achieved  SLP LONG TERM GOAL #5   Title  pt will demo awareness of verbal speech errors by correcting 95% of errors in 10 minutes mod complex conversation over two sessions    Baseline  07-11-18    Time  3    Period  Weeks   or 25 visits   Status  On-going      SLP LONG TERM GOAL #6   Title  Pt will alternate attention between 2 simple cognitive linguistic tasks with 80% on each and occasional min A over 2 sessions   07-24-18   Time  3    Period  Weeks    Status  On-going      SLP LONG TERM GOAL #7   Title  Pt will utilize external aids for memory, schedule management, med management with occasional min A from family/caregivers over 2 sessions    Status  Achieved       Plan - 08/16/18 1350    Clinical Impression Statement  Pt cont to present today with 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 Pt's use of pronouns incorrectly is a relative deficit area targeted again today, requiring consistent extra time and SLP cue to notice errors made. After education and specific work with pronouns pt's performance improved somewhat. Pt continues to exhibit decr'd higher level cognitive linguistic skills. See pt's goals for more details on specific goals. He would cont to benefit from skilled ST targeting langauge skills (recpetive and expressive) in complex conversation, and from cognitive linguistic thearpy in order to maximize langauge and cognitive skills.    Speech Therapy Frequency  2x / week    Duration  --   12 weeks or 25 total visits   Treatment/Interventions  Internal/external aids;Patient/family education;Compensatory strategies;SLP instruction and feedback;Functional tasks;Cognitive reorganization;Cueing hierarchy;Environmental controls;Language facilitation    Potential to Achieve Goals  Good    Potential Considerations  Ability to learn/carryover information       Patient will  benefit from skilled therapeutic intervention in order to improve the following deficits and impairments:   Aphasia  Cognitive communication deficit    Problem List Patient Active Problem List   Diagnosis Date Noted  . DVT, lower extremity, distal, acute, right ([place]) 04/25/2018  . Acute lower UTI   . Atelectasis   . Hypoalbuminemia due to protein-calorie malnutrition ([place])   . Left temporal lobe hemorrhage ([place]) 04/15/2018  . Vascular headache   . Seizure prophylaxis   . Cerebral edema ([facility])   . Atrial fibrillation with rapid ventricular response ([place])   . Pneumonia   . Diabetes mellitus type 2 in nonobese ([facility])   . Benign essential HTN   . History of pancreatic cancer   . FUO (fever of unknown origin)   . Dysphagia, post-stroke   . Tachycardia   . Tachypnea   . Hypothyroidism   . Hypernatremia   . Hypokalemia   . Acute blood loss anemia   . Thrombocytopenia ([place])   . Intracranial hemorrhage ([place]) 04/05/2018  . Acute pancreatitis 06/11/2014  . Headache 06/11/2014  . 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 Obstructive jaundice 11/11/2012  . Biliary stricture 11/11/2012  . Diabetes mellitus ([place]) 11/10/2012  . HTN (hypertension) 11/10/2012  . Pancreatitis 11/10/2012  . Abdominal pain 11/10/2012    [facility] ,[place], CCC-SLP  08/16/2018, 1:52 PM  [facility] [address] [place], [place], [address] Phone: [phone]   Fax:  [phone]   Name: Hector Pugh  MRN: [id number] Date of Birth: 1961-01-04

## 2018-08-19 ENCOUNTER — Inpatient Hospital Stay: Payer: Managed Care, Other (non HMO) | Attending: Oncology

## 2018-08-19 ENCOUNTER — Inpatient Hospital Stay: Payer: Managed Care, Other (non HMO) | Admitting: Oncology

## 2018-08-20 ENCOUNTER — Ambulatory Visit: Payer: Managed Care, Other (non HMO)

## 2018-08-20 DIAGNOSIS — R4701 Aphasia: Secondary | ICD-10-CM

## 2018-08-20 DIAGNOSIS — R41841 Cognitive communication deficit: Secondary | ICD-10-CM

## 2018-08-20 NOTE — Therapy (Signed)
[facility] [address] [place], [place], [address] Phone: [phone]   Fax:  [phone]  Speech Language Pathology Treatment  Patient Details  Name: Hector Pugh MRN: [id number] Date of Birth: 06-28-1961 Referring Provider: Delice Lesch, MD   Encounter Date: 08/20/2018  End of Session - 08/20/18 1652    Visit Number  21    Number of Visits  25    Date for SLP Re-Evaluation  09/06/18    Authorization Type  pre-auth - [employer]    Authorization Time Period  05-10-18 to 11-09-18    Authorization - Visit Number  21    Authorization - Number of Visits  25    SLP Start Time  1150    SLP Stop Time   1230    SLP Time Calculation (min)  40 min    Activity Tolerance  Patient tolerated treatment well       Past Medical History:  Diagnosis Date  . 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 Cancer ([place])   . Diabetes mellitus ([place]) 11/10/2012  . Diabetes mellitus without complication (HCC)    diet controlled  . HTN (hypertension) 11/10/2012  . Hypertension   . 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 Pancreatic cancer ([place])   . Pancreatitis 11/10/2012  . Seasonal allergies     Past Surgical History:  Procedure Laterality Date  . circucision    . circumsision    . COLONOSCOPY N/A 04/04/2018   Procedure: COLONOSCOPY;  Surgeon: Leighton Ruff, MD;  [facility];  Service: Endoscopy;  Laterality: N/A;  . CRANIOTOMY Left 04/05/2018   Procedure: Left Temporal CRANIOTOMY HEMATOMA EVACUATION of Intracranial Hemorrhage;  Surgeon: Ashok Pall, MD;  Location: [facility];  Service: Neurosurgery;  Laterality: Left;  . ERCP  11/08/2012   Procedure: ENDOSCOPIC RETROGRADE CHOLANGIOPANCREATOGRAPHY (ERCP);  Surgeon: Jeryl Columbia, MD;  Location[facility];  Service: Gastroenterology;  Laterality: N/A;  . EUS  11/08/2012   Procedure: UPPER ENDOSCOPIC ULTRASOUND (EUS) LINEAR;  Surgeon: Arta Silence,  MD;  [facility];  Service: Endoscopy;  Laterality: N/A;  . FINE NEEDLE ASPIRATION  11/08/2012   Procedure: FINE NEEDLE ASPIRATION (FNA) LINEAR;  Surgeon: Arta Silence, MD;  [facility];  Service: Endoscopy;  Laterality: N/A;  . HERNIA REPAIR     RIGHT  . LAPAROSCOPY  12/12/2012   Procedure: LAPAROSCOPY DIAGNOSTIC;  Surgeon: Stark Klein, MD;  [facility];  Service: General;  Laterality: N/A;  . Left Foot Surgery    . Left Foot Surgery    . VASECTOMY    . WHIPPLE PROCEDURE  12/12/2012   Procedure: WHIPPLE PROCEDURE;  Surgeon: Stark Klein, MD;  [facility];  Service: General;  Laterality: N/A;    There were no vitals filed for this visit.  Subjective Assessment - 08/20/18 1155    Subjective  "I didn't bring it. I did it - about "he" and "she"."    Currently in Pain?  No/denies            ADULT SLP TREATMENT - 08/20/18 1157      General Information   Behavior/Cognition  Alert;Cooperative;Pleasant mood;Requires cueing      Treatment Provided   Treatment provided  Cognitive-Linquistic      Cognitive-Linquistic Treatment   Treatment focused on  Aphasia    Skilled Treatment  SLP targeted pt's alternating attention between two simple tasks. Pt req'd cues where to return when starting the  more complex (still simple) task. In a sentence completion task pt req'd max cues consistently for what type of word to fill in (verb, object, qualifier, etc). Pt without transfer of initial SLP assistance on subsequent items, 90 seconds later.       Assessment / Recommendations / Plan   Plan  --   d/c likely next session due to plateau of progress     Progression Toward Goals   Progression toward goals  Not progressing toward goals (comment)   pt with much difficulty completing this task        SLP Short Term Goals - 07/18/18 0837      SLP SHORT TERM GOAL #1   Title  pt will generate sentence responses in min-mod complex language tasks 80% accuracy with  min-mod A occasionally for error awareness    Period  --   ot 9 total sessions, for all STGs   Status  Not Met      SLP SHORT TERM GOAL #2   Title  pt will read sentences of 8-9 words with error awareness at 90% over three sessions    Status  Not Met      SLP SHORT TERM GOAL #3   Title  pt will demo understanding of spoken sentence level stimuli with 80% success and occasional min A over two sessions    Status  Partially Met      SLP SHORT TERM GOAL #4   Title  pt will complete cognitive linguistic testing within first 4 therapy sessions    Period  --   or 5 total sessions   Status  Achieved      SLP SHORT TERM GOAL #5   Title  pt will complete a HEP for increasing accuracy of his verbal output with 90% success with rare min A for error awareness    Status  Not Met      SLP SHORT TERM GOAL #6   Title  Pt will selectively attend to structured cognitive lingustic tasks in mildly distracting environment with 85% on task and occasional min A for attention over 2 sessions    Status  Achieved       SLP Long Term Goals - 08/20/18 1203      SLP LONG TERM GOAL #1   Title  pt will complete a HEP for improving accuracy of his verbal output with 90% success over three sessions    Period  --   or 17 sessions   Status  Deferred   to work on cognitive linguistics     SLP [place] #2   Title  pt will participate in 6 minutes of simple-mod complex conversation appropriately, with occasional min A    Period  --   or 17 sessions   Status  Achieved      SLP LONG TERM GOAL #3   Title  pt will demo understanding of 8 minutes simple-mod complex conversation over three visits, with occasional min A    Period  --   or 17 visits   Status  Achieved      SLP LONG TERM GOAL #4   Title  pt will demo understanding of 10 minutes mod complex conversation over three visits, with modified independence    Period  --   or 25 total visits   Status  Achieved      SLP LONG TERM GOAL #5   Title   pt will demo awareness of verbal speech errors by  correcting 95% of errors in 10 minutes mod complex conversation over two sessions    Baseline  07-11-18    Time  3    Period  Weeks   or 25 visits   Status  On-going      SLP LONG TERM GOAL #6   Title  Pt will alternate attention between 2 simple cognitive linguistic tasks with 80% on each and occasional min A over 2 sessions   07-24-18   Time  3    Period  Weeks    Status  On-going      SLP LONG TERM GOAL #7   Title  Pt will utilize external aids for memory, schedule management, med management with occasional min A from family/caregivers over 2 sessions    Status  Achieved       Plan - 08/20/18 1652    Clinical Impression Statement  Today, pt cont to present with 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 Pt's reading and writing were targeted today and pt required usual max SLP cue complete the task, and consistent max A for error awareness. Pt continues to exhibit decr'd higher level cognitive linguistic skills as well, and pt will likely be d/c'd after next session due to plateau of progress.    Speech Therapy Frequency  2x / week    Duration  --   12 weeks or 25 total visits   Treatment/Interventions  Internal/external aids;Patient/family education;Compensatory strategies;SLP instruction and feedback;Functional tasks;Cognitive reorganization;Cueing hierarchy;Environmental controls;Language facilitation    Potential to Achieve Goals  Good    Potential Considerations  Ability to learn/carryover information       Patient will benefit from skilled therapeutic intervention in order to improve the following deficits and impairments:   Aphasia  Cognitive communication deficit    Problem List Patient Active Problem List   Diagnosis Date Noted  . DVT, lower extremity, distal, acute, right ([place]) 04/25/2018  . Acute lower UTI   . Atelectasis   . Hypoalbuminemia due to protein-calorie malnutrition ([place])   .  Left temporal lobe hemorrhage ([place]) 04/15/2018  . Vascular headache   . Seizure prophylaxis   . Cerebral edema ([facility])   . Atrial fibrillation with rapid ventricular response ([place])   . Pneumonia   . Diabetes mellitus type 2 in nonobese ([facility])   . Benign essential HTN   . History of pancreatic cancer   . FUO (fever of unknown origin)   . Dysphagia, post-stroke   . Tachycardia   . Tachypnea   . Hypothyroidism   . Hypernatremia   . Hypokalemia   . Acute blood loss anemia   . Thrombocytopenia ([place])   . Intracranial hemorrhage ([place]) 04/05/2018  . Acute pancreatitis 06/11/2014  . Headache 06/11/2014  . 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 Obstructive jaundice 11/11/2012  . Biliary stricture 11/11/2012  . Diabetes mellitus ([place]) 11/10/2012  . HTN (hypertension) 11/10/2012  . Pancreatitis 11/10/2012  . Abdominal pain 11/10/2012    [facility] ,[place], CCC-SLP  08/20/2018, 4:56 PM  [facility] [address] [place], [place], [address] Phone: [phone]   Fax:  [phone]   Name: Hector Pugh MRN: [id number] Date of Birth: [birth year]

## 2018-08-20 NOTE — Patient Instructions (Signed)
  Please complete the assigned speech therapy homework prior to your next session and return it to the speech therapist at your next visit.  

## 2018-08-23 ENCOUNTER — Ambulatory Visit: Payer: Managed Care, Other (non HMO)

## 2018-08-23 DIAGNOSIS — R41841 Cognitive communication deficit: Secondary | ICD-10-CM

## 2018-08-23 DIAGNOSIS — R4701 Aphasia: Secondary | ICD-10-CM

## 2018-08-23 NOTE — Therapy (Signed)
[facility] [address] [place], [place], [address] Phone: [phone]   Fax:  [phone]  Speech Language Pathology Treatment  Patient Details  Name: Hector Pugh MRN: [id number] Date of Birth: June 16, 1961 Referring Provider: Delice Lesch, MD   Encounter Date: 08/23/2018  End of Session - 08/23/18 0907    Visit Number  22    Number of Visits  25    Date for SLP Re-Evaluation  09/06/18    Authorization - Visit Number  28    Authorization - Number of Visits  25    SLP Start Time  0804    SLP Stop Time   0846    SLP Time Calculation (min)  42 min    Activity Tolerance  Patient tolerated treatment well       Past Medical History:  Diagnosis Date  . 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 Cancer ([place])   . Diabetes mellitus ([place]) 11/10/2012  . Diabetes mellitus without complication (HCC)    diet controlled  . HTN (hypertension) 11/10/2012  . Hypertension   . 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 Pancreatic cancer ([place])   . Pancreatitis 11/10/2012  . Seasonal allergies     Past Surgical History:  Procedure Laterality Date  . circucision    . circumsision    . COLONOSCOPY N/A 04/04/2018   Procedure: COLONOSCOPY;  Surgeon: Leighton Ruff, MD;  Location: [facility];  Service: Endoscopy;  Laterality: N/A;  . CRANIOTOMY Left 04/05/2018   Procedure: Left Temporal CRANIOTOMY HEMATOMA EVACUATION of Intracranial Hemorrhage;  Surgeon: Ashok Pall, MD;  Location: [facility];  Service: Neurosurgery;  Laterality: Left;  . ERCP  11/08/2012   Procedure: ENDOSCOPIC RETROGRADE CHOLANGIOPANCREATOGRAPHY (ERCP);  Surgeon: Jeryl Columbia, MD;  Location: [facility];  Service: Gastroenterology;  Laterality: N/A;  . EUS  11/08/2012   Procedure: UPPER ENDOSCOPIC ULTRASOUND (EUS) LINEAR;  Surgeon: Arta Silence, MD;  Location: [facility];  Service: Endoscopy;  Laterality: N/A;  . FINE NEEDLE  ASPIRATION  11/08/2012   Procedure: FINE NEEDLE ASPIRATION (FNA) LINEAR;  Surgeon: Arta Silence, MD;  Location: [facility];  Service: Endoscopy;  Laterality: N/A;  . HERNIA REPAIR     RIGHT  . LAPAROSCOPY  12/12/2012   Procedure: LAPAROSCOPY DIAGNOSTIC;  Surgeon: Stark Klein, MD;  Location: [facility];  Service: General;  Laterality: N/A;  . Left Foot Surgery    . Left Foot Surgery    . VASECTOMY    . WHIPPLE PROCEDURE  12/12/2012   Procedure: WHIPPLE PROCEDURE;  Surgeon: Stark Klein, MD;  Location: [facility];  Service: General;  Laterality: N/A;    There were no vitals filed for this visit.  Subjective Assessment - 08/23/18 0810    Subjective  Pt brought homework today.     Currently in Pain?  No/denies            ADULT SLP TREATMENT - 08/23/18 0810      General Information   Behavior/Cognition  Alert;Cooperative;Pleasant mood;Requires cueing      Treatment Provided   Treatment provided  Cognitive-Linquistic      Cognitive-Linquistic Treatment   Treatment focused on  Cognition    Skilled Treatment  SLP used 8-step sequence cards to work with pt's attention to detail, error awareness. See "clinical impression" for details.       Assessment / Recommendations / Plan   Plan  Discharge SLP treatment due to (comment)   plateau of  rehab potential, presently     Progression Toward Goals   Progression toward goals  --   d/c day - see goal update      SLP Education - 08/23/18 0907    Education Details  home tasks, neuropsych testing recommended    Person(s) Educated  Patient;Child(ren)    Methods  Explanation    Comprehension  Verbalized understanding       SLP Short Term Goals - 07/18/18 0837      SLP SHORT TERM GOAL #1   Title  pt will generate sentence responses in min-mod complex language tasks 80% accuracy with min-mod A occasionally for error awareness    Period  --   ot 9 total sessions, for all STGs   Status  Not Met      SLP SHORT TERM GOAL #2   Title  pt  will read sentences of 8-9 words with error awareness at 90% over three sessions    Status  Not Met      SLP SHORT TERM GOAL #3   Title  pt will demo understanding of spoken sentence level stimuli with 80% success and occasional min A over two sessions    Status  Partially Met      SLP SHORT TERM GOAL #4   Title  pt will complete cognitive linguistic testing within first 4 therapy sessions    Period  --   or 5 total sessions   Status  Achieved      SLP SHORT TERM GOAL #5   Title  pt will complete a HEP for increasing accuracy of his verbal output with 90% success with rare min A for error awareness    Status  Not Met      SLP SHORT TERM GOAL #6   Title  Pt will selectively attend to structured cognitive lingustic tasks in mildly distracting environment with 85% on task and occasional min A for attention over 2 sessions    Status  Achieved       SLP Long Term Goals - 08/23/18 0910      SLP LONG TERM GOAL #1   Title  pt will complete a HEP for improving accuracy of his verbal output with 90% success over three sessions    Period  --   or 17 sessions   Status  Deferred   to work on cognitive linguistics     SLP [place] #2   Title  pt will participate in 6 minutes of simple-mod complex conversation appropriately, with occasional min A    Period  --   or 17 sessions   Status  Achieved      SLP LONG TERM GOAL #3   Title  pt will demo understanding of 8 minutes simple-mod complex conversation over three visits, with occasional min A    Period  --   or 17 visits   Status  Achieved      SLP LONG TERM GOAL #4   Title  pt will demo understanding of 10 minutes mod complex conversation over three visits, with modified independence    Period  --   or 25 total visits   Status  Achieved      SLP LONG TERM GOAL #5   Title  pt will demo awareness of verbal speech errors by correcting 95% of errors in 10 minutes mod complex conversation over two sessions    Baseline  07-11-18     Time  3    Period  Weeks   or 25 visits   Status  Not Met      SLP LONG TERM GOAL #6   Title  Pt will alternate attention between 2 simple cognitive linguistic tasks with 80% on each and occasional min A over 2 sessions   07-24-18   Time  3    Period  Weeks    Status  Not Met      SLP LONG TERM GOAL #7   Title  Pt will utilize external aids for memory, schedule management, med management with occasional min A from family/caregivers over 2 sessions    Status  Achieved       Plan - 08/23/18 0816    Clinical Impression Statement  Today, pt cont to present with 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 Cognitive deficits c/b decr'd attention to detail, decr'd skills in alternating and divided attention, decr'd emergent awareness skills. At this time SLP does not believe pt's cognition would allow him to return to work; consultation with colleagues and supervisor may be necessary prior to pt's return to work. Neuropsych exam is strongly recommended. Mod A was usually necessary for error awareness, usual cues for attention to detail in a simple-mod complex sequencing task. Pt also does not always note errors in his expressive/verbal language. He is d/c'd today due to plateau of progress.    Speech Therapy Frequency  2x / week    Duration  --   12 weeks or 25 total visits   Treatment/Interventions  Internal/external aids;Patient/family education;Compensatory strategies;SLP instruction and feedback;Functional tasks;Cognitive reorganization;Cueing hierarchy;Environmental controls;Language facilitation    Potential to Achieve Goals  Good    Potential Considerations  Ability to learn/carryover information       Patient will benefit from skilled therapeutic intervention in order to improve the following deficits and impairments:   Cognitive communication deficit  Aphasia   SPEECH THERAPY DISCHARGE SUMMARY  Visits from Start of Care: 22  Current functional level  related to goals / functional outcomes: Pt made most gains in intellectual awareness and attention. However, please see "clinical impression" for details.  He is attempting to use more compensations for language deficits (expressive) at this time.   Remaining deficits: Receptive and expressive aphasia, cognitive linguistic deficits.   Education / Equipment: Compensations for anomia, compensations for linguistic cognitive deficits, Constant Therapy app  Plan: Patient agrees to discharge.  Patient goals were partially met. Patient is being discharged due to                                                     ?????plateau in current rehab potential. A neuropsych eval is strongly recommended.      Problem List Patient Active Problem List   Diagnosis Date Noted  . DVT, lower extremity, distal, acute, right ([place]) 04/25/2018  . Acute lower UTI   . Atelectasis   . Hypoalbuminemia due to protein-calorie malnutrition ([place])   . Left temporal lobe hemorrhage ([place]) 04/15/2018  . Vascular headache   . Seizure prophylaxis   . Cerebral edema (HCC)   . Atrial fibrillation with rapid ventricular response ([place])   . Pneumonia   . Diabetes mellitus type 2 in nonobese (HCC)   . Benign essential HTN   . History of pancreatic cancer   . FUO (fever of unknown origin)   . Dysphagia, post-stroke   .  Tachycardia   . Tachypnea   . Hypothyroidism   . Hypernatremia   . Hypokalemia   . Acute blood loss anemia   . Thrombocytopenia ([place])   . Intracranial hemorrhage ([place]) 04/05/2018  . Acute pancreatitis 06/11/2014  . Headache 06/11/2014  . 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 Obstructive jaundice 11/11/2012  . Biliary stricture 11/11/2012  . Diabetes mellitus ([place]) 11/10/2012  . HTN (hypertension) 11/10/2012  . Pancreatitis 11/10/2012  . Abdominal pain 11/10/2012    [facility] ,[place], [place]  08/23/2018, 9:11 AM  [facility] [address] [place], [place], [address] Phone: [phone]   Fax:  [phone]   Name: Hector Pugh MRN: [id number] Date of Birth: 08-13-1961

## 2018-08-23 NOTE — Patient Instructions (Addendum)
  Please ask your physician what he or she thinks about neuropsychological testing prior to your return to work.  I would suggest Constant Therapy app for continued work on your aphasia and cognition. [email or web address]

## 2018-09-06 ENCOUNTER — Ambulatory Visit: Payer: Managed Care, Other (non HMO) | Admitting: Adult Health

## 2018-09-06 ENCOUNTER — Encounter: Payer: Self-pay | Admitting: Adult Health

## 2018-09-06 VITALS — BP 111/69 | HR 48 | Ht 66.5 in | Wt 151.2 lb

## 2018-09-06 DIAGNOSIS — I69398 Other sequelae of cerebral infarction: Secondary | ICD-10-CM

## 2018-09-06 DIAGNOSIS — I10 Essential (primary) hypertension: Secondary | ICD-10-CM

## 2018-09-06 DIAGNOSIS — I69319 Unspecified symptoms and signs involving cognitive functions following cerebral infarction: Secondary | ICD-10-CM

## 2018-09-06 DIAGNOSIS — R4701 Aphasia: Secondary | ICD-10-CM

## 2018-09-06 DIAGNOSIS — I611 Nontraumatic intracerebral hemorrhage in hemisphere, cortical: Secondary | ICD-10-CM

## 2018-09-06 DIAGNOSIS — E119 Type 2 diabetes mellitus without complications: Secondary | ICD-10-CM

## 2018-09-06 DIAGNOSIS — H53461 Homonymous bilateral field defects, right side: Secondary | ICD-10-CM

## 2018-09-06 NOTE — Patient Instructions (Addendum)
Continue Eliquis (apixaban) daily for secondary stroke prevention  Continue to follow up with PCP regarding cholesterol and blood pressure management   Undergo neurocognitive testing once scheduled  See eye doctor next Thursday for visual field test and have them send testing results  Continue to stay active and maintain a healthy diet  Continue to do exercises at home that you were doing during therapies for continued cognitive and visual deficits   Continue to monitor blood pressure at home  Maintain strict control of hypertension with blood pressure goal below 130/90, diabetes with hemoglobin A1c goal below 6.5% and cholesterol with LDL cholesterol (bad cholesterol) goal below 70 mg/dL. I also advised the patient to eat a healthy diet with plenty of whole grains, cereals, fruits and vegetables, exercise regularly and maintain ideal body weight.  Followup in the future with me in 6 months or call earlier if needed       Thank you for coming to see [place] at [facility]. I hope we have been able to provide you high quality care today.  You may receive a patient satisfaction survey over the next few weeks. We would appreciate your feedback and comments so that we may continue to improve ourselves and the health of our patients.

## 2018-09-06 NOTE — Progress Notes (Signed)
I agree with the above plan 

## 2018-09-06 NOTE — Progress Notes (Signed)
[facility] [address] [place]. [place] [address] [phone]       OFFICE FOLLOW UP NOTE  [name] Date of Birth:  1961-08-30 Medical Record Number:  [id number]   Reason for visit: ICH follow up  CHIEF COMPLAINT:  Chief Complaint  Patient presents with  . Follow-up    Patient reports that he is doing well.     HPI: Hector Pugh is being seen today in the office for [place] on 04/05/18. History obtained from patient and chart review. Reviewed all radiology images and labs personally.  Hector Pugh a 57 y.o.malewith a history of DM, s/p whipple in 2012 who was lastknown well before he went to bed last night 04/05/2018 around 8:30 PM.When his wife left for work at [facility] 04/06/2018, she noticed that he was slurring his speech, however he did not have any clear deficits. He went downstairs sometime between 5 and 8:30 AM and started to make tea.Subsequently when his Hector Pugh arrived home about 8:30 AM he was in bed with vomit and not moving his right side as well. He has brought to the emergency department as a code stroke where CT revealed large temporal hemorrhage. Premorbid modified rankin scale:0. ICH Score:0. Hector Pugh, neurosurgeon, consulted and took to the OR for a left temporal craniotomy for hematoma evacuation.He was taken to OR emergently for left craniotomy and evacuation of hemorrhage by Hector Pugh. Post op placed on Keppra for seizure prophylaxis and started on IV Zosyn due to concerns of aspiration PNA and completed treatment on 5/7.  Hospital course significant for cerebral edema requiring hypertonic saline, coagulopathy with drop in platelets to 113 and INR 1.4, A fib with RVR and labile BP. He was weaned off cleviprex by 5/8 and hypertonic saline by 5/9.  Serial CCT monitored with CT 5/9 showing evolving left temporal lobe with decrease in volume, increase in localized vasogenic edema with partial effacement of left lateral ventricle, stable  infarct in left posterior temporal occipital lobe and no hydrocephalus.  He did develop lethargy later that day and hypertonic saline as well as Cleviprex resumed.  CT head on 5/11 reviewed, stable.  Hector Pugh evaluated films and felt that no significant changes noted and no need to check further CT as patient neurologically stable. Hypertonic saline and clevidipine weaned off and BP/neuro status stable.    Patient was admitted to CIR on 04/15/2018 and discharged home on 04/27/2018.  Recommended continuation of Keppra for 2 weeks total which was completed prior to being discharged from CIR and was seizure-free during his day.  He continued to have visual deficits with impaired safety awareness and receptive aphasia according to discharge notes and recommended continuing outpatient PT/OT/ST.  Patient was also deemed safe to use restart Eliquis for atrial fibrillation management.  05/29/2018 visit: Patient is being seen today for hospital follow-up and is accompanied by his Hector Pugh.  He continues to participate in PT/OT/ST at our neuro rehab clinic.  States his right side has been improving but continues to have visual deficits with aphasia.  He continues take Eliquis without bleeding or bruising.  Blood pressure satisfactory at 100/62.  Patient has gradually started to participate in previous activities except for driving due to visual loss.  Denies worsening vision or headaches.  Denies new or worsening stroke/TIA symptoms at this time.  Interval history 09/06/2018: Patient is being seen today for scheduled follow-up visit and is accompanied by his Hector Pugh.  He has since completed speech therapy as they have felt  he has reached his therapy plateau.  They also recommended undergoing neuropsych testing for continued cognitive linguistic deficits.  Per notes, cognitive deficits concerns are decreased attention to detail, decreased skills and alternating and divided attention, and decreased emergent awareness skills.   They believe that his continued cognition would not allow him to work.  OT has also been completed and he was deemed not safe to drive at that time and recommended to be evaluated by neuro-ophthalmologist in the future.  He continues to have deficits of right homonymous hemianopia and will be undergoing visual field testing next week.  Hector Pugh has contacted Dr. Posey Pronto in order to obtain neurocognitive testing for continued cognitive deficits such as inattention and difficulty multitasking.  Hector Pugh states that she has not noticed any concerns with his memory.  Continues to have mild expressive aphasia but this is also improved.  She also believes that there possibly could be some hearing loss that has been more pronounced recently but is unsure if this is actual hearing loss versus inattention.  She is considering to have patient undergo formal hearing exam.  He continues to live at home with his wife and Hector Pugh and he continues to stay active around his home.  He has not returned to work at this time but Hector Pugh is requesting a job description note from his work and have Dr. Posey Pronto assess whether he is able to continue to perform current job.  Patient has not returned to driving at this time.  He continues to take Eliquis without bleeding or bruising.  Blood pressure today satisfactory 111/69.  Patient does state that he does monitor blood pressure in the evening and typical SBP ranges 1 30-1 40 but unsure if he is obtaining readings at appropriate times and/or positions and this was reviewed with patient along with Hector Pugh he will continue to monitor.  Patient has not been reevaluated by neurosurgery for possible further imaging as Hector Pugh wanted to defer prior due to multiple appointments.  Hector Pugh plans on calling neurosurgery at this time for possible appointment for need of further imaging.  Denies new or worsening stroke/TIA symptoms.    ROS:   14 system review of systems performed and negative  with exception of loss of vision, leg swelling, speech difficulty and facial drooping  PMH:  Past Medical History:  Diagnosis Date  . 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 Cancer ([place])   . Diabetes mellitus ([place]) 11/10/2012  . Diabetes mellitus without complication ([facility])    diet controlled  . HTN (hypertension) 11/10/2012  . Hypertension   . 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 Pancreatic cancer ([place])   . Pancreatitis 11/10/2012  . Seasonal allergies     PSH:  Past Surgical History:  Procedure Laterality Date  . circucision    . circumsision    . COLONOSCOPY N/A 04/04/2018   Procedure: COLONOSCOPY;  Surgeon: Leighton Ruff, MD;  Location: [facility];  Service: Endoscopy;  Laterality: N/A;  . CRANIOTOMY Left 04/05/2018   Procedure: Left Temporal CRANIOTOMY HEMATOMA EVACUATION of Intracranial Hemorrhage;  Surgeon: Ashok Pall, MD;  Location: [facility];  Service: Neurosurgery;  Laterality: Left;  . ERCP  11/08/2012   Procedure: ENDOSCOPIC RETROGRADE CHOLANGIOPANCREATOGRAPHY (ERCP);  Surgeon: Jeryl Columbia, MD;  Location: [facility];  Service: Gastroenterology;  Laterality: N/A;  . EUS  11/08/2012   Procedure: UPPER ENDOSCOPIC ULTRASOUND (EUS) LINEAR;  Surgeon: Arta Silence, MD;  Location: [facility];  Service: Endoscopy;  Laterality:  N/A;  . FINE NEEDLE ASPIRATION  11/08/2012   Procedure: FINE NEEDLE ASPIRATION (FNA) LINEAR;  Surgeon: Arta Silence, MD;  Location: [facility];  Service: Endoscopy;  Laterality: N/A;  . HERNIA REPAIR     RIGHT  . LAPAROSCOPY  12/12/2012   Procedure: LAPAROSCOPY DIAGNOSTIC;  Surgeon: Stark Klein, MD;  Location: [facility];  Service: General;  Laterality: N/A;  . Left Foot Surgery    . Left Foot Surgery    . VASECTOMY    . WHIPPLE PROCEDURE  12/12/2012   Procedure: WHIPPLE PROCEDURE;  Surgeon: Stark Klein, MD;  Location: [facility];  Service: General;  Laterality: N/A;    Social History:  Social History    Socioeconomic History  . Marital status: Married    Spouse name: Not on file  . Number of children: Not on file  . Years of education: Not on file  . Highest education level: Not on file  Occupational History  . Not on file  Social Needs  . Financial resource strain: Not on file  . Food insecurity:    Worry: Not on file    Inability: Not on file  . Transportation needs:    Medical: Not on file    Non-medical: Not on file  Tobacco Use  . Smoking status: Never Smoker  . Smokeless tobacco: Never Used  Substance and Sexual Activity  . Alcohol use: No  . Drug use: No  . Sexual activity: Never  Lifestyle  . Physical activity:    Days per week: Not on file    Minutes per session: Not on file  . Stress: Not on file  Relationships  . Social connections:    Talks on phone: Not on file    Gets together: Not on file    Attends religious service: Not on file    Active member of club or organization: Not on file    Attends meetings of clubs or organizations: Not on file    Relationship status: Not on file  . Intimate partner violence:    Fear of current or ex partner: Not on file    Emotionally abused: Not on file    Physically abused: Not on file    Forced sexual activity: Not on file  Other Topics Concern  . Not on file  Social History Narrative  . Not on file    Family History:  Family History  Problem Relation Age of Onset  . Cancer Mother        esophageal carcinoma    Medications:   Current Outpatient Medications on File Prior to Visit  Medication Sig Dispense Refill  . acetaminophen (TYLENOL) 500 MG tablet Take 500 mg by mouth every 4 (four) hours as needed for moderate pain or headache.     Marland Kitchen amLODipine (NORVASC) 5 MG tablet Take 5 mg by mouth daily.    Marland Kitchen apixaban (ELIQUIS) 5 MG TABS tablet Take 1 tablet (5 mg total) by mouth 2 (two) times daily. 60 tablet 0  . glipiZIDE (GLUCOTROL XL) 5 MG 24 hr tablet Take 1 tablet (5 mg total) by mouth daily with  breakfast. 30 tablet 0  . levothyroxine (SYNTHROID, LEVOTHROID) 50 MCG tablet Take 1 tablet (50 mcg total) by mouth daily before breakfast. 30 tablet 0  . losartan (COZAAR) 50 MG tablet Take 1 tablet (50 mg total) by mouth 2 (two) times daily. 60 tablet 0  . metoprolol tartrate (LOPRESSOR) 100 MG tablet Take 1 tablet (100 mg total) by mouth 2 (  two) times daily. 60 tablet 0  . protein supplement shake (PREMIER PROTEIN) LIQD Take 325 mLs (11 oz total) by mouth 3 (three) times daily with meals.  0  . senna (SENOKOT) 8.6 MG TABS tablet Take 1 tablet (8.6 mg total) by mouth daily. 30 each 0  . tamsulosin (FLOMAX) 0.4 MG CAPS capsule Take 1 capsule (0.4 mg total) by mouth daily. 30 capsule 0   No current facility-administered medications on file prior to visit.     Allergies:  No Known Allergies   Physical Exam  Vitals:   09/06/18 1011  BP: 111/69  Pulse: (!) 48  Weight: 151 lb 3.2 oz (68.6 kg)  Height: 5' 6.5" (1.689 m)   Body mass index is 24.04 kg/m. No exam data present  General: well developed, well nourished, pleasant middle-aged male, seated, in no evident distress Head: head normocephalic and atraumatic.   Neck: supple with no carotid or supraclavicular bruits Cardiovascular: regular rate and rhythm, no murmurs Musculoskeletal: no deformity Skin:  no rash/petichiae Vascular:  Normal pulses all extremities  Neurologic Exam Mental Status: Awake and fully alert.  Mild expressive aphasia present.  Oriented to place and time. Recent and remote memory intact. Attention span, concentration and fund of knowledge appropriate. Mood and affect appropriate.  Cranial Nerves: Fundoscopic exam reveals sharp disc margins. Pupils equal, briskly reactive to light. Extraocular movements full without nystagmus.  Right contralateral homonymous hemianopia. Hearing intact. Facial sensation intact.  Right mild facial paralysis. Motor: Normal bulk and tone. Normal strength in all tested extremity  muscles. Sensory.: intact to touch , pinprick , position and vibratory sensation.  Coordination: Rapid alternating movements normal in all extremities. Finger-to-nose and heel-to-shin performed accurately bilaterally. Gait and Station: Arises from chair without difficulty. Stance is normal. Gait demonstrates normal stride length and balance . Able to heel, toe and tandem walk without difficulty.  Reflexes: 1+ and symmetric. Toes downgoing.     Diagnostic Data (Labs, Imaging, Testing)  Ct Head Code Stroke Wo Contrast 04/05/2018 0944 1. Large hemorrhage left temporal lobe with surrounding edema and mass-effect. Intraventricular hemorrhage with mild hydrocephalus. 13 mm midline shift. Etiologies of hemorrhage are not determined on this study but could be due to hypertension, cerebral amyloid, vascular malformation, or less likely tumor. 2. ASPECTS is 10   Ct Angio Head W Or Wo Contrast 04/05/2018  1112 Stable LEFT temporal lobe hemorrhage, with LEFT-to-RIGHT midline shift. Lobar hypertensive bleed is favored. No underlying vascular anomaly, or arterial/venous thrombosis/embolus.   Ct Head Wo Contrast 04/05/2018 2228 1. Postsurgical changes from interval partial evacuation of hematoma within the left temporal lobe with approximately 20 cc residual. 2. Stable small volume of intraventricular hemorrhage and small volume of subarachnoid hemorrhage predominantly over left convexity. 3. Decreased mass effect with 6 mm left-to-right midline shift, previously 13 mm. Improved patency of lateral ventricles. 4. No new acute intracranial abnormality identified.   Ct Head Wo Contrast 04/06/2018 0913 1. Similar postoperative appearance of residual blood products in the left temporal lobe following partial evacuation of the hematoma. 2. Stable intraventricular hemorrhage without hydrocephalus. 3. 6 mm left-to-right midline shift is stable.   Ct Head Wo Contrast 04/07/2018 1552 1. Stable to slight decrease in  size of residual blood products in the left temporal lobe following partial vacuo a shin. 2. Slight decrease in midline shift. 3. Stable interventricular hemorrhage without hydrocephalus.   Ct Head Wo Contrast 04/09/2018 0438 1. Stable left temporal lobe hemorrhage, 5 mm left-to-right midline shift, small volume subarachnoid  hemorrhage, and small volume intraventricular hemorrhage. 2. No new acute intracranial abnormality.  Ct Head Wo Contrast 04/11/2018 0540 1. Evolving left temporal lobe hemorrhage, similar in size but decreased in attenuation as compared to 04/09/2018, with increased localized vasogenic edema and regional mass effect. Secondary left-to-right shift of 7 mm. 2. Persistent small volume intraventricular hemorrhage, slightly decreased from previous. 3. No other new acute intracranial abnormality.    TTE  04/07/2018 - Left ventricle: The cavity size was normal. Wall thickness wasnormal. Systolic function was normal. The estimated ejectionfraction was in the range of 60% to 65%. Wall motion was normal;there were no regional wall motion abnormalities. Leftventricular diastolic function parameters were normal. - Aortic valve: There was mild regurgitation. - Atrial septum: No defect or patent foramen ovale was identified. - Systemic veins: Patient ventilated so unable to accurately assessCVP with IVC assessment.  CT head wo contrast 04/13/2018 IMPRESSION: 1. Stable left anterior temporal lobe hematoma, small volume intraventricular hemorrhage, and small volume subarachnoid hemorrhage. No new hemorrhage. 2. Stable surrounding edema in the left temporal lobe, associated mass effect, 7 mm left-to-right midline shift, and left uncal herniation. 3. Stable small left lateral craniotomy postsurgical changes.    ASSESSMENT: Hector Pugh is a 57 y.o. year old male here with ICH on 04/05/2018 secondary to hypertension. Vascular risk factors include HLD, HTN, DM and new onset A. fib.  Patient is being seen today for follow up with continued deficits of expressive aphasia, cognitive concerns and right hemianopia.     PLAN: -Continue Eliquis (apixaban) daily  for secondary stroke prevention -Continue f/u with cardiologist regarding atrial fibrillation and Eliquis management -Schedule appointment for neurocognitive testing and per notes, Dr. Posey Pronto is currently working on this referral -Undergo visual field testing by ophthalmologist and Hector Pugh will have results sent to this office -Advised to continue to do exercises on own at home for continued deficits -Schedule follow-up appointment with neurosurgery for possible need of follow-up appointment or additional testing -F/u with PCP regarding your HLD, HTN and DM management -continue to monitor BP at home -Maintain strict control of hypertension with blood pressure goal below 130/90, diabetes with hemoglobin A1c goal below 6.5% and cholesterol with LDL cholesterol (bad cholesterol) goal below 70 mg/dL. I also advised the patient to eat a healthy diet with plenty of whole grains, cereals, fruits and vegetables, exercise regularly and maintain ideal body weight.  Follow up in 6 months or call earlier if needed   Greater than 50% of time during this 25 minute visit was spent on counseling,explanation of diagnosis of ICH, reviewing risk factor management of HLD, HTN, DM and A. fib, planning of further management, discussion with patient and family and coordination of care  Venancio Poisson, [facility]  [facility] [address] [place], [place] [address]  Phone [phone] Fax [phone]

## 2018-09-16 ENCOUNTER — Telehealth: Payer: Self-pay | Admitting: Physical Medicine & Rehabilitation

## 2018-09-16 DIAGNOSIS — I611 Nontraumatic intracerebral hemorrhage in hemisphere, cortical: Secondary | ICD-10-CM

## 2018-09-16 NOTE — Telephone Encounter (Signed)
Patient Daughter came and states they do want to see dr Sima Matas can you please put in an order.

## 2018-09-19 ENCOUNTER — Telehealth: Payer: Self-pay | Admitting: Adult Health

## 2018-09-19 NOTE — Telephone Encounter (Signed)
Received fax from office visit on 09/12/2018 with [facility] from [facility].   Eye: Right VFI: 27% MD: -23.26 DB PSD: 10.78 DB  Eye: Left VFI: 30% MD: -24.61 DB PSD: 11.91 DB

## 2018-09-23 ENCOUNTER — Telehealth: Payer: Self-pay

## 2018-09-23 NOTE — Telephone Encounter (Signed)
Rn call [place] office at [phone]. Rn ask what kind of procedure is patient having done. The staff stated pt is having some pain in his tooth.They are not sure if he is having a tooth extraction or filling. They stated pt will be seen tomorrow for an appt. Rn advised them to call or fax our office with what p[rocedure he is having done.

## 2018-09-24 NOTE — Telephone Encounter (Signed)
Clearance form and Janett Billow NP recommendations were on the form. Form fax to [place] at [phone].

## 2018-10-03 ENCOUNTER — Encounter
Payer: Managed Care, Other (non HMO) | Attending: Physical Medicine & Rehabilitation | Admitting: Physical Medicine & Rehabilitation

## 2018-10-03 ENCOUNTER — Encounter: Payer: Self-pay | Admitting: Physical Medicine & Rehabilitation

## 2018-10-03 VITALS — BP 120/71 | HR 52 | Resp 14 | Ht 64.0 in | Wt 154.0 lb

## 2018-10-03 DIAGNOSIS — Z90411 Acquired partial absence of pancreas: Secondary | ICD-10-CM | POA: Diagnosis not present

## 2018-10-03 DIAGNOSIS — M545 Low back pain, unspecified: Secondary | ICD-10-CM

## 2018-10-03 DIAGNOSIS — I824Z1 Acute embolism and thrombosis of unspecified deep veins of right distal lower extremity: Secondary | ICD-10-CM

## 2018-10-03 DIAGNOSIS — G8929 Other chronic pain: Secondary | ICD-10-CM | POA: Insufficient documentation

## 2018-10-03 DIAGNOSIS — Z8507 Personal history of malignant neoplasm of pancreas: Secondary | ICD-10-CM | POA: Insufficient documentation

## 2018-10-03 DIAGNOSIS — I4891 Unspecified atrial fibrillation: Secondary | ICD-10-CM | POA: Diagnosis not present

## 2018-10-03 DIAGNOSIS — I10 Essential (primary) hypertension: Secondary | ICD-10-CM | POA: Diagnosis not present

## 2018-10-03 DIAGNOSIS — I69151 Hemiplegia and hemiparesis following nontraumatic intracerebral hemorrhage affecting right dominant side: Secondary | ICD-10-CM | POA: Diagnosis present

## 2018-10-03 DIAGNOSIS — I69120 Aphasia following nontraumatic intracerebral hemorrhage: Secondary | ICD-10-CM | POA: Diagnosis not present

## 2018-10-03 DIAGNOSIS — I611 Nontraumatic intracerebral hemorrhage in hemisphere, cortical: Secondary | ICD-10-CM

## 2018-10-03 DIAGNOSIS — E119 Type 2 diabetes mellitus without complications: Secondary | ICD-10-CM

## 2018-10-03 NOTE — Progress Notes (Signed)
Subjective:    Patient ID: Hector Pugh, male    DOB: 1961-08-02, 57 y.o.   MRN: [id number]  HPI 57 year old male with history of pancreatic cancer, T2DM, HTN presents for follow up for left temporal lobe hemorrhage.   Daughter provides history. Last clinic visit 07/11/18.  Since last visit, pt states completed SLP. He saw Neurology. They have a schedule to see Neuropsych in January. He states he continues to have some peripheral visual deficits on his right side, but is able to compensate if aware. Denies back complains.   Pain Inventory Average Pain 0 Pain Right Now 0 My pain is no pain  In the last 24 hours, has pain interfered with the following? General activity 0 Relation with others 0 Enjoyment of life 0 What TIME of day is your pain at its worst? no pain Sleep (in general) Fair  Pain is worse with: no pain Pain improves with: no pain Relief from Meds: no pain  Mobility walk without assistance ability to climb steps?  yes do you drive?  no  Function employed # of hrs/week 0- short term disability Do you have any goals in this area?  yes  Neuro/Psych No problems in this area  Prior Studies Any changes since last visit?  no  Physicians involved in your care Any changes since last visit?  no   Family History  Problem Relation Age of Onset  . Cancer Mother        esophageal carcinoma   Social History   Socioeconomic History  . Marital status: Married    Spouse name: Not on file  . Number of children: Not on file  . Years of education: Not on file  . Highest education level: Not on file  Occupational History  . Not on file  Social Needs  . Financial resource strain: Not on file  . Food insecurity:    Worry: Not on file    Inability: Not on file  . Transportation needs:    Medical: Not on file    Non-medical: Not on file  Tobacco Use  . Smoking status: Never Smoker  . Smokeless tobacco: Never Used  Substance and Sexual Activity  . Alcohol use:  No  . Drug use: No  . Sexual activity: Never  Lifestyle  . Physical activity:    Days per week: Not on file    Minutes per session: Not on file  . Stress: Not on file  Relationships  . Social connections:    Talks on phone: Not on file    Gets together: Not on file    Attends religious service: Not on file    Active member of club or organization: Not on file    Attends meetings of clubs or organizations: Not on file    Relationship status: Not on file  Other Topics Concern  . Not on file  Social History Narrative  . Not on file   Past Surgical History:  Procedure Laterality Date  . circucision    . circumsision    . COLONOSCOPY N/A 04/04/2018   Procedure: COLONOSCOPY;  Surgeon: Leighton Ruff, MD;  Location: [facility];  Service: Endoscopy;  Laterality: N/A;  . CRANIOTOMY Left 04/05/2018   Procedure: Left Temporal CRANIOTOMY HEMATOMA EVACUATION of Intracranial Hemorrhage;  Surgeon: Ashok Pall, MD;  Location: [facility];  Service: Neurosurgery;  Laterality: Left;  . ERCP  11/08/2012   Procedure: ENDOSCOPIC RETROGRADE CHOLANGIOPANCREATOGRAPHY (ERCP);  Surgeon: Jeryl Columbia, MD;  [facility];  Service: Gastroenterology;  Laterality: N/A;  . EUS  11/08/2012   Procedure: UPPER ENDOSCOPIC ULTRASOUND (EUS) LINEAR;  Surgeon: Arta Silence, MD;  [facility];  Service: Endoscopy;  Laterality: N/A;  . FINE NEEDLE ASPIRATION  11/08/2012   Procedure: FINE NEEDLE ASPIRATION (FNA) LINEAR;  Surgeon: Arta Silence, MD;  [facility];  Service: Endoscopy;  Laterality: N/A;  . HERNIA REPAIR     RIGHT  . LAPAROSCOPY  12/12/2012   Procedure: LAPAROSCOPY DIAGNOSTIC;  Surgeon: Stark Klein, MD;  [facility];  Service: General;  Laterality: N/A;  . Left Foot Surgery    . Left Foot Surgery    . VASECTOMY    . WHIPPLE PROCEDURE  12/12/2012   Procedure: WHIPPLE PROCEDURE;  Surgeon: Stark Klein, MD;  [facility];  Service: General;  Laterality: N/A;   Past Medical  History:  Diagnosis Date  . 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 Cancer ([place])   . Diabetes mellitus ([place]) 11/10/2012  . Diabetes mellitus without complication (HCC)    diet controlled  . HTN (hypertension) 11/10/2012  . Hypertension   . 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 Pancreatic cancer ([place])   . Pancreatitis 11/10/2012  . Seasonal allergies    BP 120/71   Pulse (!) 52   Resp 14   Ht 5\' 4"  (1.626 m)   Wt 154 lb (69.9 kg)   SpO2 99%   BMI 26.43 kg/m   Opioid Risk Score:   Fall Risk Score:  `1  Depression screen PHQ 2/9  Depression screen PHQ 2/9 05/09/2018  Decreased Interest 0  Down, Depressed, Hopeless 1  PHQ - 2 Score 1  Altered sleeping 0  Tired, decreased energy 1  Change in appetite 0  Feeling bad or failure about yourself  0  Trouble concentrating 1  Moving slowly or fidgety/restless 0  Suicidal thoughts 0  PHQ-9 Score 3  Difficult doing work/chores Not difficult at all   Review of Systems  Constitutional: Negative.   HENT: Negative.   Eyes: Positive for visual disturbance.  Respiratory: Negative.   Cardiovascular: Negative.   Gastrointestinal: Negative.   Endocrine: Negative.   Genitourinary: Negative.   Musculoskeletal: Negative.   Skin: Negative.   Allergic/Immunologic: Negative.   Neurological: Negative.   Hematological: Negative.   Psychiatric/Behavioral: Negative.   All other systems reviewed and are negative.     Objective:   Physical Exam Constitutional: No distress . Vital signs reviewed. HENT: Normocephalic.  Craniotomy incision C/D/I Eyes: EOMI. No discharge. Cardiovascular: RRR. No JVD. Respiratory: CTA bilaterally. Normal effort. GI: BS+. Non-distended. Musc: No edema and no tenderness. Neurological: He is alert.  Aphasia significantly improved Right inattention, improves Motor: LUE/LLE: 5/5 proximal to distal RUE: 5/5 proximal to distal  RLE: 5/5 proximal to distal  Skin: Warm and  dry. Intact Psychiatric: Normal mood and behavior.    Assessment & Plan:  57 year old male with history of pancreatic cancer, T2DM, HTN presents for follow up for left temporal lobe hemorrhage.   1. 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    2. DVT right gastroc and soleal veins  Cont Eliquis per Cards   3. Chronic low back pain:  Resolved at present  Cont Robaxin 500 BID PRN  4. Coping  Follow up with Neuropsych   >25 minutes spent with patient and family discussing above, particularly return to work

## 2018-10-04 ENCOUNTER — Telehealth: Payer: Self-pay | Admitting: Physical Medicine & Rehabilitation

## 2018-10-04 DIAGNOSIS — I611 Nontraumatic intracerebral hemorrhage in hemisphere, cortical: Secondary | ICD-10-CM

## 2018-10-04 NOTE — Telephone Encounter (Signed)
Daughter came in today requesting OT at [facility] [address] [phone] Mertie Clause Fax [phone]  She also asked for Dr Posey Pronto to call her on some other questions she has. She stated she sent him a secure message through my chart. Nicholes Rough [phone]

## 2018-10-04 NOTE — Telephone Encounter (Signed)
We can make the referral.  I called and spoke with daughter regarding previously mentioned concerns.  Thanks.

## 2018-10-04 NOTE — Addendum Note (Signed)
Addended by: Caro Hight on: 10/04/2018 03:31 PM   Modules accepted: Orders

## 2018-10-04 NOTE — Telephone Encounter (Addendum)
Referral placed.

## 2018-11-07 ENCOUNTER — Ambulatory Visit: Payer: Managed Care, Other (non HMO) | Admitting: Physical Medicine & Rehabilitation

## 2018-11-14 ENCOUNTER — Encounter: Payer: Managed Care, Other (non HMO) | Admitting: Physical Medicine & Rehabilitation

## 2018-11-28 ENCOUNTER — Encounter
Payer: Managed Care, Other (non HMO) | Attending: Physical Medicine & Rehabilitation | Admitting: Physical Medicine & Rehabilitation

## 2018-11-28 ENCOUNTER — Encounter: Payer: Self-pay | Admitting: Physical Medicine & Rehabilitation

## 2018-11-28 VITALS — BP 118/71 | HR 59 | Resp 14 | Ht 64.0 in | Wt 154.0 lb

## 2018-11-28 DIAGNOSIS — M545 Low back pain: Secondary | ICD-10-CM | POA: Diagnosis not present

## 2018-11-28 DIAGNOSIS — I10 Essential (primary) hypertension: Secondary | ICD-10-CM

## 2018-11-28 DIAGNOSIS — I611 Nontraumatic intracerebral hemorrhage in hemisphere, cortical: Secondary | ICD-10-CM

## 2018-11-28 DIAGNOSIS — Z90411 Acquired partial absence of pancreas: Secondary | ICD-10-CM | POA: Diagnosis not present

## 2018-11-28 DIAGNOSIS — Z8507 Personal history of malignant neoplasm of pancreas: Secondary | ICD-10-CM | POA: Diagnosis present

## 2018-11-28 DIAGNOSIS — I69151 Hemiplegia and hemiparesis following nontraumatic intracerebral hemorrhage affecting right dominant side: Secondary | ICD-10-CM | POA: Insufficient documentation

## 2018-11-28 DIAGNOSIS — I69120 Aphasia following nontraumatic intracerebral hemorrhage: Secondary | ICD-10-CM | POA: Diagnosis not present

## 2018-11-28 DIAGNOSIS — G8929 Other chronic pain: Secondary | ICD-10-CM | POA: Diagnosis not present

## 2018-11-28 DIAGNOSIS — I4891 Unspecified atrial fibrillation: Secondary | ICD-10-CM | POA: Insufficient documentation

## 2018-11-28 NOTE — Progress Notes (Signed)
Subjective:    Patient ID: Hector Pugh, male    DOB: March 28, 1961, 57 y.o.   MRN: [id number]  HPI 57 year old male with history of pancreatic cancer, T2DM, HTN presents for follow up for left temporal lobe hemorrhage.   Daughter provides history. Last clinic visit 10/03/18.  Since last visit, pt has not being doing HEP.  He is following up with Neurology. Communication with family between visits regarding returning to work and driving.  Back pain is controlled. He sees Neuropsych next week.    Pain Inventory Average Pain 0 Pain Right Now 0 My pain is no pain  In the last 24 hours, has pain interfered with the following? General activity 0 Relation with others 0 Enjoyment of life 0 What TIME of day is your pain at its worst? no pain Sleep (in general) Good  Pain is worse with: no pain Pain improves with: no pain Relief from Meds: no pain  Mobility walk without assistance how many minutes can you walk? 60 ability to climb steps?  yes do you drive?  no Do you have any goals in this area?  yes  Function employed # of hrs/week . disabled: date disabled . Do you have any goals in this area?  yes  Neuro/Psych No problems in this area  Prior Studies Any changes since last visit?  no  Physicians involved in your care Any changes since last visit?  no   Family History  Problem Relation Age of Onset  . Cancer Mother        esophageal carcinoma   Social History   Socioeconomic History  . Marital status: Married    Spouse name: Not on file  . Number of children: Not on file  . Years of education: Not on file  . Highest education level: Not on file  Occupational History  . Not on file  Social Needs  . Financial resource strain: Not on file  . Food insecurity:    Worry: Not on file    Inability: Not on file  . Transportation needs:    Medical: Not on file    Non-medical: Not on file  Tobacco Use  . Smoking status: Never Smoker  . Smokeless tobacco: Never  Used  Substance and Sexual Activity  . Alcohol use: No  . Drug use: No  . Sexual activity: Never  Lifestyle  . Physical activity:    Days per week: Not on file    Minutes per session: Not on file  . Stress: Not on file  Relationships  . Social connections:    Talks on phone: Not on file    Gets together: Not on file    Attends religious service: Not on file    Active member of club or organization: Not on file    Attends meetings of clubs or organizations: Not on file    Relationship status: Not on file  Other Topics Concern  . Not on file  Social History Narrative  . Not on file   Past Surgical History:  Procedure Laterality Date  . circucision    . circumsision    . COLONOSCOPY N/A 04/04/2018   Procedure: COLONOSCOPY;  Surgeon: Leighton Ruff, MD;  [facility];  Service: Endoscopy;  Laterality: N/A;  . CRANIOTOMY Left 04/05/2018   Procedure: Left Temporal CRANIOTOMY HEMATOMA EVACUATION of Intracranial Hemorrhage;  Surgeon: Ashok Pall, MD;  Location: [facility];  Service: Neurosurgery;  Laterality: Left;  . ERCP  11/08/2012   Procedure:  ENDOSCOPIC RETROGRADE CHOLANGIOPANCREATOGRAPHY (ERCP);  Surgeon: Jeryl Columbia, MD;  [facility];  Service: Gastroenterology;  Laterality: N/A;  . EUS  11/08/2012   Procedure: UPPER ENDOSCOPIC ULTRASOUND (EUS) LINEAR;  Surgeon: Arta Silence, MD;  [facility];  Service: Endoscopy;  Laterality: N/A;  . FINE NEEDLE ASPIRATION  11/08/2012   Procedure: FINE NEEDLE ASPIRATION (FNA) LINEAR;  Surgeon: Arta Silence, MD;  [facility];  Service: Endoscopy;  Laterality: N/A;  . HERNIA REPAIR     RIGHT  . LAPAROSCOPY  12/12/2012   Procedure: LAPAROSCOPY DIAGNOSTIC;  Surgeon: Stark Klein, MD;  [facility];  Service: General;  Laterality: N/A;  . Left Foot Surgery    . Left Foot Surgery    . VASECTOMY    . WHIPPLE PROCEDURE  12/12/2012   Procedure: WHIPPLE PROCEDURE;  Surgeon: Stark Klein, MD;  [facility];   Service: General;  Laterality: N/A;   Past Medical History:  Diagnosis Date  . 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 Cancer ([place])   . Diabetes mellitus ([place]) 11/10/2012  . Diabetes mellitus without complication (HCC)    diet controlled  . HTN (hypertension) 11/10/2012  . Hypertension   . 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 Pancreatic cancer ([place])   . Pancreatitis 11/10/2012  . Seasonal allergies    BP 118/71   Pulse (!) 59   Resp 14   Ht 5\' 4"  (1.626 m)   Wt 154 lb (69.9 kg)   SpO2 98%   BMI 26.43 kg/m   Opioid Risk Score:   Fall Risk Score:  `1  Depression screen PHQ 2/9  Depression screen PHQ 2/9 05/09/2018  Decreased Interest 0  Down, Depressed, Hopeless 1  PHQ - 2 Score 1  Altered sleeping 0  Tired, decreased energy 1  Change in appetite 0  Feeling bad or failure about yourself  0  Trouble concentrating 1  Moving slowly or fidgety/restless 0  Suicidal thoughts 0  PHQ-9 Score 3  Difficult doing work/chores Not difficult at all   Review of Systems  Constitutional: Positive for chills and unexpected weight change.  HENT: Negative.   Respiratory: Negative.   Cardiovascular: Negative.   Gastrointestinal: Negative.   Endocrine: Negative.   Genitourinary: Positive for difficulty urinating.  Musculoskeletal: Negative.   Skin: Negative.   Allergic/Immunologic: Negative.   Neurological: Negative.   Hematological: Negative.   Psychiatric/Behavioral: Negative.   All other systems reviewed and are negative.     Objective:   Physical Exam Constitutional: No distress . Vital signs reviewed. HENT: Normocephalic.  Craniotomy incision C/D/I Eyes: EOMI. No discharge. Cardiovascular: RRR. No JVD. Respiratory: CTA bilaterally. Normal effort. GI: BS+. Non-distended. Musc: No edema and no tenderness. Neurological: He is alert.  Aphasia significantly improved Right inattention, improves Motor: LUE/LLE: 5/5 proximal to  distal RUE: 5/5 proximal to distal  RLE: 5/5 proximal to distal  Skin: Warm and dry. Intact Psychiatric: Normal mood and behavior.    Assessment & Plan:  57 year old male with history of pancreatic cancer, T2DM, HTN presents for follow up for left temporal lobe hemorrhage.   1. Left gaze preference with right inattention, right hemiparesis, receptive > expressive aphasia secondary to left temporal lobe hemorrhage  Completed therapies, cont HEP  Cont follow up with Neurology  Follow up with Neurosurg in future if necessary  Avoid driving and return to work  Will consider SLP next year  2. DVT right gastroc and  soleal veins  Cont Eliquis per Cards   3. Chronic low back pain: Resolved at present  Cont Robaxin 500 BID PRN  4. Coping  Follow up with Neuropsych  >60 minutes spent with >55 minutes with patient and family regarding prognosis, cognitive deficits, visual deficits, visual acuity, driving, return to work

## 2018-12-06 ENCOUNTER — Encounter: Payer: Managed Care, Other (non HMO) | Attending: Physical Medicine & Rehabilitation | Admitting: Psychology

## 2018-12-06 ENCOUNTER — Encounter

## 2018-12-06 DIAGNOSIS — I4891 Unspecified atrial fibrillation: Secondary | ICD-10-CM | POA: Insufficient documentation

## 2018-12-06 DIAGNOSIS — M545 Low back pain: Secondary | ICD-10-CM | POA: Insufficient documentation

## 2018-12-06 DIAGNOSIS — Z8507 Personal history of malignant neoplasm of pancreas: Secondary | ICD-10-CM | POA: Diagnosis present

## 2018-12-06 DIAGNOSIS — I10 Essential (primary) hypertension: Secondary | ICD-10-CM | POA: Insufficient documentation

## 2018-12-06 DIAGNOSIS — I69151 Hemiplegia and hemiparesis following nontraumatic intracerebral hemorrhage affecting right dominant side: Secondary | ICD-10-CM | POA: Diagnosis present

## 2018-12-06 DIAGNOSIS — Z90411 Acquired partial absence of pancreas: Secondary | ICD-10-CM | POA: Diagnosis not present

## 2018-12-06 DIAGNOSIS — R4701 Aphasia: Secondary | ICD-10-CM

## 2018-12-06 DIAGNOSIS — F09 Unspecified mental disorder due to known physiological condition: Secondary | ICD-10-CM | POA: Diagnosis not present

## 2018-12-06 DIAGNOSIS — I611 Nontraumatic intracerebral hemorrhage in hemisphere, cortical: Secondary | ICD-10-CM | POA: Diagnosis not present

## 2018-12-06 DIAGNOSIS — G8929 Other chronic pain: Secondary | ICD-10-CM | POA: Diagnosis not present

## 2018-12-06 DIAGNOSIS — I69120 Aphasia following nontraumatic intracerebral hemorrhage: Secondary | ICD-10-CM | POA: Diagnosis not present

## 2019-01-10 ENCOUNTER — Ambulatory Visit: Payer: Managed Care, Other (non HMO) | Admitting: Psychology

## 2019-01-12 ENCOUNTER — Encounter: Payer: Self-pay | Admitting: Psychology

## 2019-01-12 NOTE — Progress Notes (Signed)
Neuropsychological Consultation   Patient:   Hector Pugh   DOB:   01-14-1961  MR Number:  [id number]  Location:  [facility] [place], [place] [id number] [address] [place] [address] Dept: [phone]           Date of Service:   12/06/2018  Start Time:   10 AM End Time:   11 AM  Provider/Observer:  Ilean Skill, Psy.D.       Clinical Neuropsychologist       Billing Code/Service: Neurobehavioral status exam  Reason for Service:  Hector Pugh is a 58 year old male referred by Dr. Posey Pronto for neuropsychological evaluation.  The patient has a history of pancreatic difficulties that have now been determined to not be pancreatic cancer.  The patient also has a history of type 2 diabetes, hypertension.  The patient was admitted on 04/05/2018 with decreases in consciousness, vomiting episodes, difficulty speaking and right-sided weakness.  The patient was found to have a large left temporal hemorrhage with anemia and mass-effect with shift.  The patient was taken to the OR emergently for left craniotomy with evacuation of hemorrhage by Dr. Christella Noa.  The patient developed aspiration pneumonia during hospitalization.  The patient did well with significant improvement in functioning during comprehensive inpatient rehabilitation.  Issues related to expressive and receptive aphasia and cognitive deficits were addressed.  The patient has continued to have speech difficulties, cognitive difficulties related to understanding and trouble with information processing.  There continue to be some vision changes but the patient insists that he is not having memory issues but the patient's family and therapist are all identified memory and cognitive issues along with residual aphasia.  The patient is reading now but is not able to comprehend information has difficulty putting information together.  The patient  has been active in speech therapy and they have now hit their desired goals and part of the referral to me was to assess what should be done as far as further rehabilitative efforts.  The patient has not returned to work and is not driving.  Current Status:  The patient is described as having speech difficulties, difficulty with cognition and understanding, memory difficulties, and vision changes.  Reliability of Information: Information is derived from 1 hour face-to-face clinical interview with the patient and his daughter as well as review of available medical records.  Behavioral Observation: Hector Pugh  presents as a 58 y.o.-year-old Right handed Male who appeared his stated age. his dress was Appropriate and he was Well Groomed and his manners were Appropriate to the situation.  his participation was indicative of Appropriate and Redirectable behaviors.  There were any physical disabilities noted.  he displayed an appropriate level of cooperation and motivation.     Interactions:    Active Appropriate and Redirectable  Attention:   abnormal and attention span appeared shorter than expected for age  Memory:   abnormal; global memory impairment noted  Visuo-spatial:  not examined  Speech (Volume):  low  Speech:   non-fluent aphasia;   Thought Process:  Coherent and Relevant  Though Content:  WNL; not suicidal and not homicidal  Orientation:   person, place, time/date and situation  Judgment:   Poor  Planning:   Poor  Affect:    Depressed  Mood:    Dysphoric  Insight:   Shallow  Intelligence:   high  Marital Status/Living: The patient was born in [place] and has 3 siblings.  He currently lives with his wife and daughter.  He has been married for 34 years.  The patient has a 24 year old daughter and a 68 year old son.  Current Employment: The patient is not working at the present time.  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  Substance Use:  No concerns of substance abuse are reported.    Education:   The patient has his masters in [employer] and completed his graduate education in [place].  Medical History:   Past Medical History:  Diagnosis Date  . 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 Cancer ([place])   . Diabetes mellitus ([place]) 11/10/2012  . Diabetes mellitus without complication (HCC)    diet controlled  . HTN (hypertension) 11/10/2012  . Hypertension   . 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 Pancreatic cancer ([place])   . Pancreatitis 11/10/2012  . Seasonal allergies              Psychiatric History:  The patient has no prior psychiatric history.  Family Med/Psych History:  Family History  Problem Relation Age of Onset  . Cancer Mother        esophageal carcinoma    Risk of Suicide/Violence: virtually non-existent the patient denies any suicidal or homicidal ideation.  Impression/DX:  Hector Pugh is a 58 year old male referred by Dr. Posey Pronto for neuropsychological evaluation.  The patient has a history of pancreatic difficulties that have now been determined to not be pancreatic cancer.  The patient also has a history of type 2 diabetes, hypertension.  The patient was admitted on 04/05/2018 with decreases in consciousness, vomiting episodes, difficulty speaking and right-sided weakness.  The patient was found to have a large left temporal hemorrhage with anemia and mass-effect with shift.  The patient was taken to the OR emergently for left craniotomy with evacuation of hemorrhage by Dr. Christella Noa.  The patient developed aspiration pneumonia during hospitalization.  The patient did well with significant improvement in functioning during comprehensive inpatient rehabilitation.  Issues related to expressive and receptive aphasia and cognitive deficits were addressed.  The patient has continued to have speech  difficulties, cognitive difficulties related to understanding and trouble with information processing.  There continue to be some vision changes but the patient insists that he is not having memory issues but the patient's family and therapist are all identified memory and cognitive issues along with residual aphasia.  The patient is reading now but is not able to comprehend information has difficulty putting information together.  The patient has been active in speech therapy and they have now hit their desired goals and part of the referral to me was to assess what should be done as far as further rehabilitative efforts.  The patient has not returned to work and is not driving.  The patient is described as having speech difficulties, difficulty with cognition and understanding, memory difficulties, and vision changes.  Disposition/Plan:  We have set the patient up for formal neuropsychological evaluation and will be administering the Wechsler Adult Intelligence Scale-IV as well as the Wechsler Memory Scale.  After these are completed it will be determined whether we will need or will not need any further cognitive testing.  Diagnosis:    Left temporal lobe hemorrhage (HCC)  Cognitive disorder  Expressive aphasia  Benign essential HTN         Electronically Signed  _______________________ Ilean Skill, Psy.D.

## 2019-01-20 ENCOUNTER — Encounter: Payer: Managed Care, Other (non HMO) | Attending: Physical Medicine & Rehabilitation | Admitting: Psychology

## 2019-01-20 DIAGNOSIS — G8929 Other chronic pain: Secondary | ICD-10-CM | POA: Insufficient documentation

## 2019-01-20 DIAGNOSIS — Z90411 Acquired partial absence of pancreas: Secondary | ICD-10-CM | POA: Diagnosis not present

## 2019-01-20 DIAGNOSIS — I10 Essential (primary) hypertension: Secondary | ICD-10-CM | POA: Insufficient documentation

## 2019-01-20 DIAGNOSIS — Z8507 Personal history of malignant neoplasm of pancreas: Secondary | ICD-10-CM | POA: Diagnosis present

## 2019-01-20 DIAGNOSIS — M545 Low back pain: Secondary | ICD-10-CM | POA: Insufficient documentation

## 2019-01-20 DIAGNOSIS — I69151 Hemiplegia and hemiparesis following nontraumatic intracerebral hemorrhage affecting right dominant side: Secondary | ICD-10-CM | POA: Diagnosis present

## 2019-01-20 DIAGNOSIS — I4891 Unspecified atrial fibrillation: Secondary | ICD-10-CM | POA: Insufficient documentation

## 2019-01-20 DIAGNOSIS — I69120 Aphasia following nontraumatic intracerebral hemorrhage: Secondary | ICD-10-CM | POA: Insufficient documentation

## 2019-01-20 DIAGNOSIS — I611 Nontraumatic intracerebral hemorrhage in hemisphere, cortical: Secondary | ICD-10-CM

## 2019-01-23 ENCOUNTER — Encounter: Payer: Self-pay | Admitting: Psychology

## 2019-01-23 ENCOUNTER — Encounter (HOSPITAL_BASED_OUTPATIENT_CLINIC_OR_DEPARTMENT_OTHER): Payer: Managed Care, Other (non HMO) | Admitting: Psychology

## 2019-01-23 DIAGNOSIS — I6990 Unspecified sequelae of unspecified cerebrovascular disease: Secondary | ICD-10-CM

## 2019-01-23 DIAGNOSIS — R4701 Aphasia: Secondary | ICD-10-CM

## 2019-01-23 DIAGNOSIS — I611 Nontraumatic intracerebral hemorrhage in hemisphere, cortical: Secondary | ICD-10-CM | POA: Diagnosis not present

## 2019-01-23 DIAGNOSIS — F09 Unspecified mental disorder due to known physiological condition: Secondary | ICD-10-CM

## 2019-01-23 DIAGNOSIS — G8929 Other chronic pain: Secondary | ICD-10-CM | POA: Diagnosis not present

## 2019-01-23 NOTE — Progress Notes (Signed)
[facility] [address] [place] [address] [phone]       OFFICE FOLLOW UP NOTE  [name] Date of Birth:  Oct 24, 1961 Medical Record Number:  [id number]   Reason for visit: ICH follow up  CHIEF COMPLAINT:  Chief Complaint  Patient presents with  . Follow-up    dgtr- [place], "chills/cold sensation that goes up my left side then crosses over to right side of head, happening sporadically since my bleed, goes away quickly"    HPI: Hector Pugh is being seen today in the office for [place] on 04/05/18. History obtained from patient and chart review. Reviewed all radiology images and labs personally.  Hector Pugh a 58 y.o.malewith a history of DM, s/p whipple in 2012 who was lastknown well before he went to bed last night 04/05/2018 around 8:30 PM.When his wife left for work at [employer] 04/06/2018, she noticed that he was slurring his speech, however he did not have any clear deficits. He went downstairs sometime between 5 and 8:30 AM and started to make tea.Subsequently when his daughter arrived home about 8:30 AM he was in bed with vomit and not moving his right side as well. He has brought to the emergency department as a code stroke where CT revealed large temporal hemorrhage. Premorbid modified rankin scale:0. ICH Score:0. Dr. Christella Noa, neurosurgeon, consulted and took to the OR for a left temporal craniotomy for hematoma evacuation.He was taken to OR emergently for left craniotomy and evacuation of hemorrhage by Dr. Christella Noa. Post op placed on Keppra for seizure prophylaxis and started on IV Zosyn due to concerns of aspiration PNA and completed treatment on 5/7.  Hospital course significant for cerebral edema requiring hypertonic saline, coagulopathy with drop in platelets to 113 and INR 1.4, A fib with RVR and labile BP. He was weaned off cleviprex by 5/8 and hypertonic saline by 5/9.  Serial CCT monitored with CT 5/9 showing evolving left temporal lobe  with decrease in volume, increase in localized vasogenic edema with partial effacement of left lateral ventricle, stable infarct in left posterior temporal occipital lobe and no hydrocephalus.  He did develop lethargy later that day and hypertonic saline as well as Cleviprex resumed.  CT head on 5/11 reviewed, stable.  Dr. Christella Noa evaluated films and felt that no significant changes noted and no need to check further CT as patient neurologically stable. Hypertonic saline and clevidipine weaned off and BP/neuro status stable.    Patient was admitted to CIR on 04/15/2018 and discharged home on 04/27/2018.  Recommended continuation of Keppra for 2 weeks total which was completed prior to being discharged from CIR and was seizure-free during his day.  Evidence during CIR stay of right gastrocnemius and soleal vein DVT and Eliquis initiated for management.  He continued to have visual deficits with impaired safety awareness and receptive aphasia according to discharge notes and recommended continuing outpatient PT/OT/ST.    05/29/2018 visit: Patient is being seen today for hospital follow-up and is accompanied by his son.  He continues to participate in PT/OT/ST at our neuro rehab clinic.  States his right side has been improving but continues to have visual deficits with aphasia.  He continues take Eliquis without bleeding or bruising.  Blood pressure satisfactory at 100/62.  Patient has gradually started to participate in previous activities except for driving due to visual loss.  Denies worsening vision or headaches.  Denies new or worsening stroke/TIA symptoms at this time.  Interval history 60/03/2018: Patient is being  seen today for scheduled follow-up visit and is accompanied by his daughter.  He has since completed speech therapy as they have felt he has reached his therapy plateau.  They also recommended undergoing neuropsych testing for continued cognitive linguistic deficits.  Per notes, cognitive deficits  concerns are decreased attention to detail, decreased skills and alternating and divided attention, and decreased emergent awareness skills.  They believe that his continued cognition would not allow him to work.  OT has also been completed and he was deemed not safe to drive at that time and recommended to be evaluated by neuro-ophthalmologist in the future.  He continues to have deficits of right homonymous hemianopia and will be undergoing visual field testing next week.  Daughter has contacted Dr. Posey Pronto in order to obtain neurocognitive testing for continued cognitive deficits such as inattention and difficulty multitasking.  Daughter states that she has not noticed any concerns with his memory.  Continues to have mild expressive aphasia but this is also improved.  She also believes that there possibly could be some hearing loss that has been more pronounced recently but is unsure if this is actual hearing loss versus inattention.  She is considering to have patient undergo formal hearing exam.  He continues to live at home with his wife and daughter and he continues to stay active around his home.  He has not returned to work at this time but daughter is requesting a job description note from his work and have Dr. Posey Pronto assess whether he is able to continue to perform current job.  Patient has not returned to driving at this time.  He continues to take Eliquis without bleeding or bruising.  Blood pressure today satisfactory 111/69.  Patient does state that he does monitor blood pressure in the evening and typical SBP ranges 1 30-1 40 but unsure if he is obtaining readings at appropriate times and/or positions and this was reviewed with patient along with daughter he will continue to monitor.  Patient has not been reevaluated by neurosurgery for possible further imaging as daughter wanted to defer prior due to multiple appointments.  Daughter plans on calling neurosurgery at time for possible appointment for  need of further imaging.  Denies new or worsening stroke/TIA symptoms.  Interval history 01/27/2019: Mr. Brummitt is a 58 year old male who is being seen today for stroke follow-up and is accompanied by his daughter who provides majority of history.  He was evaluated by Dr. Sima Matas on 12/06/2018 and recommended formal neurocognitive evaluation which was performed on 01/23/2019.  Evaluation results consistent with significant residual cognitive deficits and it was not recommended for patient to return to work as he would likely have significant profound difficulty performing his work duties.  It was also recommended for patient not to return to driving due to visual deficits and impairment regarding information processing speed which would affect cognitive demands needed for safe driving.   He continues to have residual stroke deficits of right homonymous hemianopia, aphasia and cognitive deficits.  He was evaluated by ophthalmology last week and per daughter, no visual changes compared to prior exam. He continues on Eliquis without side effects of bleeding or bruising.  After discussion with daughter and review of notes, Eliquis initiated for evidence of DVT which was found during CIR admission.  Atrial fibrillation in RVR was questionable during admission and therefore Eliquis was not initiated for atrial fibrillation.  He does continue to follow with cardiology in [facility] [place] Dr. Dillon Bjork and daughter believes appointment  is scheduled sometime this summer.  Per daughter, cardiology deferred duration of Eliquis to our office.  At this time, he has been on Eliquis therapy for approximately 9 months.  He has not had any repeat lower extremity venous Dopplers since initial DVT finding.  She is also questioning need of continuation of antihypertensives and metoprolol as blood pressure stable and heart rate typically on the lower side.  Blood pressure today satisfactory at 113/72 which is his normal level as he does  continue to monitor at home.  No further concerns at this time.  Denies new or worsening stroke/TIA symptoms.  He does have complaints of left-sided "chill" sensation that goes up the left side of his body all the way up to his neck and crosses over to the right side of his head and will only last for a few seconds.  He denies any burning sensation, electrical shock sensation or any other painful symptoms.  He denies any headaches/migraines or any other new/worsening neurological symptom.  This has been occurring approx 2 months after returning home from initial [place] admission and occurs approximately 1-2 times per month.  Daughter states that a couple weeks ago these symptoms were consistent for 3-4 days and even kept him up at night.  Denies any increased stress or sickness at that time.  Since then, he denies recurrence of the symptoms.      ROS:   14 system review of systems performed and negative with exception of chills (left side only), hearing loss, loss of vision, cold intolerance, difficulty urinating, incontinence of bladder, memory loss, speech difficulty, facial drooping and decreased concentration  PMH:  Past Medical History:  Diagnosis Date  . 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 Cancer ([facility])   . Diabetes mellitus ([facility]) 11/10/2012  . Diabetes mellitus without complication ([facility])    diet controlled  . HTN (hypertension) 11/10/2012  . Hypertension   . 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 Pancreatic cancer ([facility])   . Pancreatitis 11/10/2012  . Seasonal allergies     PSH:  Past Surgical History:  Procedure Laterality Date  . circucision    . circumsision    . COLONOSCOPY N/A 04/04/2018   Procedure: COLONOSCOPY;  Surgeon: Leighton Ruff, MD;  Location: [facility];  Service: Endoscopy;  Laterality: N/A;  . CRANIOTOMY Left 04/05/2018   Procedure: Left Temporal CRANIOTOMY HEMATOMA EVACUATION of Intracranial Hemorrhage;  Surgeon: Ashok Pall,  MD;  Location: [facility];  Service: Neurosurgery;  Laterality: Left;  . ERCP  11/08/2012   Procedure: ENDOSCOPIC RETROGRADE CHOLANGIOPANCREATOGRAPHY (ERCP);  Surgeon: Jeryl Columbia, MD;  Location: [facility];  Service: Gastroenterology;  Laterality: N/A;  . EUS  11/08/2012   Procedure: UPPER ENDOSCOPIC ULTRASOUND (EUS) LINEAR;  Surgeon: Arta Silence, MD;  Location: [facility];  Service: Endoscopy;  Laterality: N/A;  . FINE NEEDLE ASPIRATION  11/08/2012   Procedure: FINE NEEDLE ASPIRATION (FNA) LINEAR;  Surgeon: Arta Silence, MD;  Location: [facility];  Service: Endoscopy;  Laterality: N/A;  . HERNIA REPAIR     RIGHT  . LAPAROSCOPY  12/12/2012   Procedure: LAPAROSCOPY DIAGNOSTIC;  Surgeon: Stark Klein, MD;  Location: [facility];  Service: General;  Laterality: N/A;  . Left Foot Surgery    . Left Foot Surgery    . VASECTOMY    . WHIPPLE PROCEDURE  12/12/2012   Procedure: WHIPPLE PROCEDURE;  Surgeon: Stark Klein, MD;  Location: [facility];  Service: General;  Laterality: N/A;  Social History:  Social History   Socioeconomic History  . Marital status: Married    Spouse name: Not on file  . Number of children: Not on file  . Years of education: Not on file  . Highest education level: Not on file  Occupational History  . Not on file  Social Needs  . Financial resource strain: Not on file  . Food insecurity:    Worry: Not on file    Inability: Not on file  . Transportation needs:    Medical: Not on file    Non-medical: Not on file  Tobacco Use  . Smoking status: Never Smoker  . Smokeless tobacco: Never Used  Substance and Sexual Activity  . Alcohol use: No  . Drug use: No  . Sexual activity: Never  Lifestyle  . Physical activity:    Days per week: Not on file    Minutes per session: Not on file  . Stress: Not on file  Relationships  . Social connections:    Talks on phone: Not on file    Gets together: Not on file    Attends religious service: Not on file    Active member of  club or organization: Not on file    Attends meetings of clubs or organizations: Not on file    Relationship status: Not on file  . Intimate partner violence:    Fear of current or ex partner: Not on file    Emotionally abused: Not on file    Physically abused: Not on file    Forced sexual activity: Not on file  Other Topics Concern  . Not on file  Social History Narrative   Lives with wife, daughter [name]    Family History:  Family History  Problem Relation Age of Onset  . Cancer Mother        esophageal carcinoma    Medications:   Current Outpatient Medications on File Prior to Visit  Medication Sig Dispense Refill  . acetaminophen (TYLENOL) 500 MG tablet Take 500 mg by mouth every 4 (four) hours as needed for moderate pain or headache.     Marland Kitchen amLODipine (NORVASC) 5 MG tablet Take 5 mg by mouth daily.    Marland Kitchen apixaban (ELIQUIS) 5 MG TABS tablet Take 1 tablet (5 mg total) by mouth 2 (two) times daily. 60 tablet 0  . glipiZIDE (GLUCOTROL XL) 5 MG 24 hr tablet Take 1 tablet (5 mg total) by mouth daily with breakfast. 30 tablet 0  . levothyroxine (SYNTHROID, LEVOTHROID) 50 MCG tablet Take 1 tablet (50 mcg total) by mouth daily before breakfast. 30 tablet 0  . losartan (COZAAR) 50 MG tablet Take 1 tablet (50 mg total) by mouth 2 (two) times daily. 60 tablet 0  . metoprolol tartrate (LOPRESSOR) 100 MG tablet Take 1 tablet (100 mg total) by mouth 2 (two) times daily. 60 tablet 0  . protein supplement shake (PREMIER PROTEIN) LIQD Take 325 mLs (11 oz total) by mouth 3 (three) times daily with meals.  0  . senna (SENOKOT) 8.6 MG TABS tablet Take 1 tablet (8.6 mg total) by mouth daily. 30 each 0  . tamsulosin (FLOMAX) 0.4 MG CAPS capsule Take 1 capsule (0.4 mg total) by mouth daily. 30 capsule 0   No current facility-administered medications on file prior to visit.     Allergies:  No Known Allergies   Physical Exam  Vitals:   01/27/19 0716  BP: 113/72  Pulse: (!) 53  Weight: 157 lb  6.4 oz (71.4 kg)  Height: 5\' 4"  (1.626 m)   Body mass index is 27.02 kg/m. No exam data present  General: well developed, well nourished, pleasant middle-aged male, seated, in no evident distress Head: head normocephalic and atraumatic.   Neck: supple with no carotid or supraclavicular bruits Cardiovascular: regular rate and rhythm, no murmurs Musculoskeletal: no deformity Skin:  no rash/petichiae Vascular:  Normal pulses all extremities  Neurologic Exam Mental Status: Awake and fully alert.  Mild expressive aphasia.  Oriented to place and time. Recent and remote memory intact during appointment. Attention span, concentration and fund of knowledge appropriate during appointment. Mood and affect appropriate.  Cranial Nerves: Pupils equal, briskly reactive to light. Extraocular movements full without nystagmus.  Right homonymous hemianopia. Hearing intact. Facial sensation intact.   Motor: Normal bulk and tone. Normal strength in all tested extremity muscles. Sensory.: intact to touch , pinprick , position and vibratory sensation.  Coordination: Rapid alternating movements normal in all extremities. Finger-to-nose and heel-to-shin performed accurately bilaterally. Gait and Station: Arises from chair without difficulty. Stance is normal. Gait demonstrates normal stride length and balance .  Reflexes: 1+ and symmetric. Toes downgoing.     Diagnostic Data (Labs, Imaging, Testing)  Ct Head Code Stroke Wo Contrast 04/05/2018 0944 1. Large hemorrhage left temporal lobe with surrounding edema and mass-effect. Intraventricular hemorrhage with mild hydrocephalus. 13 mm midline shift. Etiologies of hemorrhage are not determined on this study but could be due to hypertension, cerebral amyloid, vascular malformation, or less likely tumor. 2. ASPECTS is 10   Ct Angio Head W Or Wo Contrast 04/05/2018  1112 Stable LEFT temporal lobe hemorrhage, with LEFT-to-RIGHT midline shift. Lobar hypertensive bleed  is favored. No underlying vascular anomaly, or arterial/venous thrombosis/embolus.   Ct Head Wo Contrast 04/05/2018 2228 1. Postsurgical changes from interval partial evacuation of hematoma within the left temporal lobe with approximately 20 cc residual. 2. Stable small volume of intraventricular hemorrhage and small volume of subarachnoid hemorrhage predominantly over left convexity. 3. Decreased mass effect with 6 mm left-to-right midline shift, previously 13 mm. Improved patency of lateral ventricles. 4. No new acute intracranial abnormality identified.   Ct Head Wo Contrast 04/06/2018 0913 1. Similar postoperative appearance of residual blood products in the left temporal lobe following partial evacuation of the hematoma. 2. Stable intraventricular hemorrhage without hydrocephalus. 3. 6 mm left-to-right midline shift is stable.   Ct Head Wo Contrast 04/07/2018 1552 1. Stable to slight decrease in size of residual blood products in the left temporal lobe following partial vacuo a shin. 2. Slight decrease in midline shift. 3. Stable interventricular hemorrhage without hydrocephalus.   Ct Head Wo Contrast 04/09/2018 0438 1. Stable left temporal lobe hemorrhage, 5 mm left-to-right midline shift, small volume subarachnoid hemorrhage, and small volume intraventricular hemorrhage. 2. No new acute intracranial abnormality.  Ct Head Wo Contrast 04/11/2018 0540 1. Evolving left temporal lobe hemorrhage, similar in size but decreased in attenuation as compared to 04/09/2018, with increased localized vasogenic edema and regional mass effect. Secondary left-to-right shift of 7 mm. 2. Persistent small volume intraventricular hemorrhage, slightly decreased from previous. 3. No other new acute intracranial abnormality.    TTE  04/07/2018 - Left ventricle: The cavity size was normal. Wall thickness wasnormal. Systolic function was normal. The estimated ejectionfraction was in the range of 60% to 65%. Wall  motion was normal;there were no regional wall motion abnormalities. Leftventricular diastolic function parameters were normal. - Aortic valve: There was mild regurgitation. - Atrial septum: No  defect or patent foramen ovale was identified. - Systemic veins: Patient ventilated so unable to accurately assessCVP with IVC assessment.  CT head wo contrast 04/13/2018 IMPRESSION: 1. Stable left anterior temporal lobe hematoma, small volume intraventricular hemorrhage, and small volume subarachnoid hemorrhage. No new hemorrhage. 2. Stable surrounding edema in the left temporal lobe, associated mass effect, 7 mm left-to-right midline shift, and left uncal herniation. 3. Stable small left lateral craniotomy postsurgical changes.    ASSESSMENT: Hector Pugh is a 58 y.o. 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 Vascular risk factors include HLD, HTN, DM and potential AF.  Evidence of DVT therefore Eliquis initiated during CIR stay.  He returns today for follow-up visit with residual deficits of right hemianopia, aphasia and cognitive deficits along with concern of "chill" sensation that goes up his left leg to his neck and crosses over to the right side of his head which has been present since his ICH.    PLAN: -Continue Eliquis (apixaban) daily  for secondary stroke prevention -Repeat lower extremity venous Dopplers to assess for residual DVT -if no evidence, it will be recommended to discontinue Eliquis and consideration of initiating aspirin 81 mg daily for secondary stroke prevention.  -Continue f/u with cardiologist regarding potential atrial fibrillation, HTN and HR concern with possible consideration of lowering doses of current medications.  -As there is no concrete evidence of atrial fibrillation and history of spontaneous ICH, it would not be recommended for long-term anticoagulation unless future evidence of high AF burden and at this time anticoagulation use will be  determined by cardiology -Unclear etiology of "chill" sensation - recommend to continue to monitor at this time and advised daughter to notify office if these become more frequent or accompanied by any neurological symptoms -Continue to follow with Dr. Sima Matas for ongoing post stroke cognitive deficits -Continue to follow with ophthalmology as scheduled -F/u with PCP regarding your HLD, HTN and DM management -continue to monitor BP at home -Maintain strict control of hypertension with blood pressure goal below 130/90, diabetes with hemoglobin A1c goal below 6.5% and cholesterol with LDL cholesterol (bad cholesterol) goal below 70 mg/dL. I also advised the patient to eat a healthy diet with plenty of whole grains, cereals, fruits and vegetables, exercise regularly and maintain ideal body weight.  Follow up in 6 months or call earlier if needed   Greater than 50% of time during this 25 minute visit was spent on counseling,explanation of diagnosis of ICH, reviewing risk factor management of HLD, HTN, DM and potential A. fib, planning of further management, discussion with patient and family and coordination of care  Venancio Poisson, [facility]  [facility] [address] [place], [place] [address]  Phone [phone] Fax [phone]

## 2019-01-23 NOTE — Progress Notes (Signed)
Patient:  Hector Pugh   DOB: May 27, 1961  MR Number: [id number]  Location: [facility] PHYS[facility] MEDICINE AND REHABILITATION [place], [place] [id number] [place] [id number] Dept: [phone]  Start: 4 PM End: 5 PM  Provider/Observer:     Edgardo Roys PsyD  Chief Complaint:      Chief Complaint  Patient presents with  . Cerebrovascular Accident  . Other    Visual disturbances, cognitive deficits, and expressive language deficits.    Reason For Service:     Hector Pugh is a 58 year old male referred by Dr. Posey Pronto for neuropsychological evaluation.  The patient has a history of pancreatic difficulties that have now been determined to not be pancreatic cancer.  The patient also has a history of type 2 diabetes, hypertension.  The patient was admitted on 04/05/2018 with decreases in consciousness, vomiting episodes, difficulty speaking and right-sided weakness.  The patient was found to have a large left temporal hemorrhage with anemia and mass-effect with shift.  The patient was taken to the OR emergently for left craniotomy with evacuation of hemorrhage by Dr. Christella Noa.  The patient developed aspiration pneumonia during hospitalization.  The patient did well with significant improvement in functioning during comprehensive inpatient rehabilitation.  Issues related to expressive and receptive aphasia and cognitive deficits were addressed.  The patient has continued to have speech difficulties, cognitive difficulties related to understanding and trouble with information processing.  There continue to be some vision changes but the patient insists that he is not having memory issues but the patient's family and therapist are all identified memory and cognitive issues along with residual aphasia.  The patient is reading now but is not able to comprehend information has difficulty putting information together.  The  patient has been active in speech therapy and they have now hit their desired goals and part of the referral to me was to assess what should be done as far as further rehabilitative efforts.  The patient has not returned to work and is not driving.  Testing Administered:  The patient was administered the Wechsler Adult Intelligence Scale-IV as well as the Wechsler Memory Scale-IV.  Participation Level:   Active  Participation Quality:  Appropriate and Redirectable      Behavioral Observation:  Well Groomed, Alert, and Appropriate and The patient appeared to fully participate throughout these testing procedures and this does appear to be a fair and valid sample of his current cognitive functioning..   Test Results:   Initially, the patient was administered the Wechsler Adult Intelligence Scale-IV.  The patient appeared to work his hardest throughout this test battery and this does appear to be a fair and valid assessment of his current functioning.  There were significant deficits noted on multiple areas of functioning.  Composite Score Summary  Scale Sum of Scaled Scores Composite Score Percentile Rank 95% Conf. Interval Qualitative Description  Verbal Comprehension 19 VCI 80 9 75-86 Low Average  Perceptual Reasoning 22 PRI 84 14 79-91 Low Average  Working Memory 17 WMI 92 30 86-99 Average  Processing Speed 11 PSI 76 5 70-87 Borderline  Full Scale 69 FSIQ 79 8 75-83 Borderline  General Ability 41 GAI 80 9 76-86 Low Average   The patient produced a full scale IQ score of 79 which falls at the 8th percentile and is in the borderline range.  Given the patient's academic and occupational history this is a significant decline that likely approaches 40  T score points minimally.  The patient also produced a general abilities index score which places left weight on overall information processing speed to adjust first acute changes and gives a more stable score as far as overall cognitive functioning  with a composite score of 80.  This T score of 80 falls at the 9th percentile and is in the low average range and again represents a a performance that is significantly below expected functioning premorbidly.  Individual composite scores show that the patient produced a verbal comprehension index score of 80 which falls at the 9th percentile and is in the low average range.  Perceptual reasoning index score was 84 falling at the 14th percentile and in the low average range.  The patient's working memory index score was 92 which falls at the 30th percentile and is in the average range.  The patient's information processing speed index score was a 76 falling at the 5th percentile and in the borderline range of functioning.  Verbal Comprehension Subtests Summary  Subtest Raw Score Scaled Score Percentile Rank Reference Group Scaled Score SEM  Similarities 16 6 9 5  1.08  Vocabulary 16 5 5 5  0.73  Information 11 8 25 9  0.67  (Comprehension) 25 10 50 11 1.08   The patient produced a verbal comprehension index score of 80 which falls at the 9th percentile and is in the low average range.  Individual subtest making up this composite score show that the patient's verbal reasoning and vocabulary were significantly impaired.  The patient produced performance with regard to his general fund of information and social judgment/comprehension in the average range and while these are below what would be expected based on education and occupational history they are in the average range and not indicative of significant residual deficits.  There were significant deficits noted with regard to verbal reasoning and problem-solving, vocabulary abilities which suggest retrieval issues.  Perceptual Reasoning Subtests Summary  Subtest Raw Score Scaled Score Percentile Rank Reference Group Scaled Score SEM  Block Design 24 7 16 6  1.04  Matrix Reasoning 12 8 25 6  0.95  Visual Puzzles 9 7 16 6  0.99  (Figure Weights) 12 10 50 8  0.99  (Picture Completion) 6 6 9 5  1.12   The patient produced a perceptual reasoning index score of 84 which falls at the 14th percentile and is in the low average range.  The patient had generally consistent performances showing average performance with regard to some elements of visual reasoning and problem-solving while he had more difficulties as the complexity of the task increase.  The patient's visual spatial abilities were significantly impaired and given expected levels on these measures based on education occupation these are severely impaired ranges relative to his previous performance.  The patient has significant deficits with regard to his ability to identify visual anomalies from a gestalt.   Working Doctor, general practice Raw Score Scaled Score Percentile Rank Reference Group Scaled Score SEM  Digit Span 18 6 9 5  0.85  Arithmetic 15 11 63 11 1.04  (Letter-Number Seq.) 18 9 37 8 1.08   The patient produced a working memory index score of 92 which falls at the 30th percentile and is in the average range.  This index score is his best area of performance and suggest that his current encoding abilities are functioning in the average range.  This is the prerequisite or starting point of all memory and learning and it does indicate that he is able  to take in auditory/verbal information and hold it in consciousness for brief periods of time.  The patient in fact did better on auditory encoding tasks with the greatest cognitive and processing demands.  However, given his exceptional abilities with regard to mathematics and engineering these performances do suggest a relative loss of functioning from premorbid levels.  Processing Speed Subtests Summary  Subtest Raw Score Scaled Score Percentile Rank Reference Group Scaled Score SEM  Symbol Search 12 4 2 3  1.31  Coding 43 7 16 5  0.99  (Cancellation) 21 5 5 4  1.34   The patient produced a processing speed index score of 76 which  falls at the 5th percentile and is in the borderline range of functioning.  The patient showed significant deficits with regard to information processing speed, visual scanning and searching.  His visual scanning searching deficits were seen both for items requiring vertical scanning as well as horizontal scanning.  This level of performance would not only indicate deficits with regard to visual processing but also significant reduction in overall information processing speed this is a significantly impaired level of performance both to his normative comparison group and especially to his estimation of premorbid functioning.  ABILITY-MEMORY ANALYSIS  Ability Score:  GAI: 80 Date of Testing:  WAIS-IV; WMS-IV 2019/01/20  Predicted Difference Method   Index Predicted WMS-IV Index Score Actual WMS-IV Index Score Difference Critical Value  Significant Difference Y/N Base Rate  Auditory Memory 89 77 12 8.95 Y 15-20%  Visual Memory 88 85 3 8.82 N   Visual Working Memory 87 80 7 11.24 N   Immediate Memory 87 75 12 10.35 Y 15%  Delayed Memory 88 81 7 10.08 N   Statistical significance (critical value) at the .01 level.   The patient was then administered the Wechsler Memory Scale-IV.  We utilize the global abilities index score to create a predicted level of functioning.  However, given the fact that there are significant cognitive deficits identified this predicted score grossly underestimates his likely premorbid functioning.  The patient's global abilities index score was 80 and I would estimate that his premorbid global abilities index score would be 120 or higher.  The patient produced a working memory index score from the Wechsler Adult Intelligence Scale-IV of 92 which fell at the 30th percentile and was in the average range but significantly below likely premorbid functioning.  On the current memory test the patient produced a visual working memory of 32 which is below his auditory working  memory and would suggest greater deficits with regard to visual working memory and visual encoding.  The patient produced an immediate memory index score of 75 which is in the borderline range of functioning and significantly below predicted levels both for his current level of overall cognitive functioning but to a greater degree significant loss and deficits relative to premorbid functioning.  The patient had difficulty initially learning both auditory and visual information.  The patient produced an auditory memory index score of 77 which is significantly below predicted levels based on his current global abilities as well as even greater indications of deficits relative to predicted level of performance prior to his cerebrovascular accident.  The patient produced a visual memory index score of 85 and while this level of performance was not below his current global abilities index it is again significantly below likely premorbid levels of functioning.  This performance does fall at the low average range relative to a normative population.  The patient produced a delayed  memory index score of 81 which is not statistically significantly below predicted levels based on his current global cognitive functioning but are significantly below predicted premorbid levels of functioning.  The patient did not show significant benefit from cueing his memory and therefore these memory deficits appear to be related to storage and organization of information rather than simple retrieval deficits.  Summary of Results:   Overall, the results of the current neuropsychological evaluation do suggest significant profound deficits relative to predicted levels of previous functioning.  There are significant residual/late effects of his cerebrovascular accident.  These effects are not simply focal deficits on specific areas or specific hemispheric functions but are identified for a broad range of neuropsychological functioning  that would encompass most areas of cortically mediated cognitive functioning.  The patient's best area of functioning had to do with his auditory encoding/working memory which falls at the 30th percentile and was in the average range.  However even this score is likely to be significantly below premorbid functioning.  The patient's visual encoding/visual working memory was significantly below his auditory encoding memory with visual working memory in the bottom end of the low average range.  The patient showed significant relative deficits to a normative population with regard to verbal comprehension abilities including verbal reasoning and problem-solving and vocabulary.  The patient's current general fund of information and social judgment/comprehension are in the average range and he retains the ability to do well as far as his basic general fund of information and social skills abilities.  The patient showed deficits with regard to visual spatial abilities and some aspects of visual reasoning and problem-solving.  The patient's information processing speed was his most impaired area of cognitive functioning with the exception of his auditory memory deficits.  The patient showed significant impairments with regard to information processing speed and visual scanning/visual searching abilities.  With regard to specific memory deficits there are significant memory deficits noted.  The patient did generally well with regard to auditory encoding abilities but had some significant difficulties with visual encoding abilities.  However, the patient's auditory memory both immediate as well as delayed or significantly impaired.  Is generally well preserved auditory encoding versus significant deficits with regard to both immediate and delayed auditory memory suggest that his memory deficits are not related to significant inability to attend and encode the information but deficits with regard to organization and storage.   This also does not appear to be a simple retrieval deficit but related to storage and organization of information to be learned.  The pattern showed less significant deficits with regard to visual memory and retrieval and while they were in the low average range they are significantly below what would be predicted with regard to premorbid functioning.  Impression/Diagnosis:   Overall, the results of the current objective neuropsychological evaluation are consistent with significant residual cognitive deficits.  While the patient has voiced strong desire to return to work as soon as he can, the current level of cognitive deficits in a broad range of neuropsychological areas would suggest that the patient is not ready to return to work at this time.  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  While the patient's social judgment and general fund of information are performing in the average range and he is able to show appropriate interpersonal functioning and good impulse control the patient is not likely to be able to perform the  cognitive tasks of his job.  Also, the patient has reported a strong desire to begin driving again and the level of visual-spatial deficits and significant impairments with regard to information processing speed would also make that quite difficult and the patient is likely to be unable to effectively perform the cognitive demands needed for safe driving.  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  The patient is continuing to show improvement and I would highly recommend ongoing and continuing rehabilitation efforts and is important that the patient remain as engaged as  possible and day-to-day activities as well as cognitively stimulating activities.  We are still less than 1 year post temporal lobe hemorrhage and the patient is well within time constraints that would allow for further improvements in his overall cognitive functioning.  We will need to do repeat testing in approximately 9 months to objectively assess this improvement to help guide the patient in his decision making as far as returning to work and driving.  At this point right now the patient should not be returning to his previous job and should not be driving.  Diagnosis:    Axis I: Left temporal lobe hemorrhage ([place])  Cognitive disorder  Expressive aphasia  Late effects of cerebrovascular accident   Ilean Skill, Psy.D. Neuropsychologist           WAIS-IV Wechsler Adult Intelligence Scale-Fourth Edition Score Report   Examinee Name Abisai Deer  Date of Report 2019/01/23  Justice Rocher [id number]  Years of Education   Date of Birth December 15, 1960  Primary Language   Gender Male  Handedness   Race/Ethnicity   Examiner Name Ilean Skill  Date of Testing 2019/01/20  Age at Testing 14 years 3 months  Retest? No   Comments: Composite Score Summary  Scale Sum of Scaled Scores Composite Score Percentile Rank 95% Conf. Interval Qualitative Description  Verbal Comprehension 19 VCI 80 9 75-86 Low Average  Perceptual Reasoning 22 PRI 84 14 79-91 Low Average  Working Memory 17 WMI 92 30 86-99 Average  Processing Speed 11 PSI 76 5 70-87 Borderline  Full Scale 69 FSIQ 79 8 75-83 Borderline  General Ability 41 GAI 80 9 76-86 Low Average  Confidence Intervals are based on the Overall Average SEMs. The [facility] is an optional composite summary score that is less sensitive to the influence of working memory and processing speed. Because working memory and processing speed are vital to a comprehensive evaluation of cognitive ability, it should be noted that the [facility] does not have the  breadth of construct coverage as the FSIQ. Note. The vertical bars represent the standard error of measurement (SEM). SEM values are based on the examinee's age.   ANALYSIS   Index Level Discrepancy Comparisons  Comparison Score 1 Score 2 Difference Critical Value .05 Significant Difference Y/N Base Rate by Overall Sample  VCI - PRI 80 84 -4 8.31 N 39.7  VCI - WMI 80 92 -12 8.82 Y 18.1  VCI - PSI 80 76 4 10.19 N 41.1  PRI - WMI 84 92 -8 9.74 N 28.9  PRI - PSI 84 76 8 11.00 N 30.1  WMI - PSI 92 76 16 11.38 Y 14.9  FSIQ - GAI 79 80 -1 3.51 N 44.9  Base Rate by Overall Sample. Statistical significance (critical value) at the .05 level.  Verbal Comprehension Subtests Summary  Subtest Raw Score Scaled Score Percentile Rank Reference Group Scaled Score SEM  Similarities 16 6 9 5  1.08  Vocabulary 16 5 5 5  0.73  Information 11 8 25 9  0.67  (Comprehension) 25 10 50 11 1.08   Perceptual Reasoning Subtests Summary  Subtest Raw Score Scaled Score Percentile Rank Reference Group Scaled Score SEM  Block Design 24 7 16 6  1.04  Matrix Reasoning 12 8 25 6  0.95  Visual Puzzles 9 7 16 6  0.99  (Figure Weights) 12 10 50 8 0.99  (Picture Completion) 6 6 9 5  1.12   Working Doctor, general practice Raw Score Scaled Score Percentile Rank Reference Group Scaled Score SEM  Digit Span 18 6 9 5  0.85  Arithmetic 15 11 63 11 1.04  (Letter-Number Seq.) 18 9 37 8 1.08   Processing Speed Subtests Summary  Subtest Raw Score Scaled Score Percentile Rank Reference Group Scaled Score SEM  Symbol Search 12 4 2 3  1.31  Coding 43 7 16 5  0.99  (Cancellation) 21 5 5 4  1.34   Subtest Level Discrepancy Comparisons  Subtest Comparison Score 1 Score 2 Difference Critical Value .05 Significant Difference Y/N Base Rate  Digit Span - Arithmetic 6 11 -5 2.57 Y 4.50  Symbol Search - Coding 4 7 -3 3.41 N 15.00  Statistical significance (critical value) at the .05 level.  DETERMINING STRENGTHS AND  WEAKNESSES   Differences Between Subtest and Overall Mean of Subtest Scores  Subtest Subtest Scaled Score Mean Scaled Score Difference Critical Value .05 Strength or Weakness Base Rate  Block Design 7 6.90 0.10 2.85  >25%  Similarities 6 6.90 -0.90 2.82  >25%  Digit Span 6 6.90 -0.90 2.22  >25%  Matrix Reasoning 8 6.90 1.10 2.54  >25%  Vocabulary 5 6.90 -1.90 2.03  >25%  Arithmetic 11 6.90 4.10 2.73 S 2-5%  Symbol Search 4 6.90 -2.90 3.42  15-25%  Visual Puzzles 7 6.90 0.10 2.71  >25%  Information 8 6.90 1.10 2.19  >25%  Coding 7 6.90 0.10 2.97  >25%  Overall: Mean = 6.90, Scatter =  7, Base rate =  48.9 Base Rate for Intersubtest Scatter is reported for 10 Subtests. Statistical significance (critical value) at the .05 level.   PROCESS ANALYSIS   Perceptual Reasoning Process Score Summary  Process Score Raw Score Scaled Score Percentile Rank SEM  Block Design No Time Bonus 24 7 16  1.12   Working Memory Process Score Summary  Process Score Raw Score Scaled Score Percentile Rank Base Rate SEM  Digit Span Forward 6 5 5  -- 1.44  Digit Span Backward 7 8 25  -- 1.27  Digit Span Sequencing 5 6 9  -- 1.37  Longest Digit Span Forward 4 -- -- 100.0 --  Longest Digit Span Backward 4 -- -- 79.5 --  Longest Digit Span Sequence 4 -- -- 96.5 --  Longest Letter-Number Sequence 4 -- -- 92.5 --    Process Level Discrepancy Comparisons  Process Comparison Score 1 Score 2 Difference Critical Value .05 Significant Difference Y/N Base Rate  Block Design - Block Design No Time Bonus 7 7 0 3.08 N   Digit Span Forward - Digit Span Backward 5 8 -3 3.65 N 18.4  Digit Span Forward - Digit Span Sequencing 5 6 -1 3.60 N 45.2  Digit Span Backward - Digit Span Sequencing 8 6 2  3.56 N 29.9  Longest Digit Span Forward - Longest Digit Span Backward 4 4 0 -- --  Longest Digit Span Forward - Longest Digit Span Sequence 4 4 0 -- --   Longest Digit Span Backward - Longest Digit Span Sequence 4 4 0  -- --   Statistical significance (critical value) at the .05 level.   Raw Scores  Subtest Score Range Raw Score  Process Score Range Raw Score  Block Design 0-66 24  Block Design No Time Bonus 0-48 24  Similarities 0-36 16  Digit Span Forward 0-16 6  Digit Span 0-48 18  Digit Span Backward 0-16 7  Matrix Reasoning 0-26 12  Digit Span Sequencing 0-16 5  Vocabulary 0-57 16  Longest Digit Span Forward 0, 2-9 4  Arithmetic 0-22 15  Longest Digit Span Backward 0, 2-8 4  Symbol Search 0-60 12  Longest Digit Span Sequence 0, 2-9 4  Visual Puzzles 0-26 9  Longest Letter-Number Seq. 0, 2-8 4  Information 0-26 11      Coding 0-135 43      Letter-Number Seq. 0-30 18      Figure Weights 0-27 12      Comprehension 0-36 25      Cancellation 0-72 21      Picture Completion 0-24 6          WMS-IV Wechsler Memory Scale-Fourth Edition Score Report   Examinee Name Brylan Dec  Date of Report 2019/01/23  Kingsley Spittle ID [id number]  Years of Education 68  Date of Birth 28-Jul-1961  Home Language   Gender Male  Handedness Not Specified  Race/Ethnicity   Examiner Name Ilean Skill  Date of Testing 2019/01/20  Age at Testing 2 years 3 months  Retest? No   Brief Cognitive Status Exam Classification  Age Years of Education Raw Score Classification Level Base Rate  57 years 3 months 18 44 Low 3.5    Index Score Summary  Index Sum of Scaled Scores Index Score Percentile Rank 95% Confidence Interval Qualitative Descriptor  Auditory Memory (AMI) 24 77 6 72-84 Borderline  Visual Memory (VMI) 30 85 16 80-91 Low Average  Visual Working Memory (VWMI) 13 80 9 74-89 Low Average  Immediate Memory (IMI) 25 75 5 70-82 Borderline  Delayed Memory (DMI) 29 81 10 75-89 Low Average    The vertical bars represent the standard error of measurement (SEM).   Primary Subtest Scaled Score Summary  Subtest Domain Raw Score Scaled Score Percentile Rank  Logical Memory I AM 18 7 16   Logical Memory II AM  12 6 9   Verbal Pa[employer] Associates I AM 12 5 5   Verbal [employer] II AM 5 6 9   Designs I VM 45 5 5  Designs II VM 36 6 9  Visual Reproduction I VM 30 8 25   Visual Reproduction II VM 25 11 63  Spatial Addition VWM 8 8 25   Symbol Span VWM 10 5 5     PROCESS SCORE CONVERSIONS  Auditory Memory Process Score Summary  Process Score Raw Score Scaled Score Percentile Rank Cumulative Percentage (Base Rate)  LM II Recognition 21 - - 10-16%  VPA II Recognition 38 - - 26-50%   Visual Memory Process Score Summary  Process Score Raw Score Scaled Score Percentile Rank Cumulative Percentage (Base Rate)  DE I Content 23 4 2  -  DE I Spatial 16 10 50 -  DE II Content 20 4 2  -  DE II Spatial 12 10 50 -  DE II Recognition 11 - - 10-16%  VR II Recognition 6 - - 51-75%    SUBTEST-LEVEL  DIFFERENCES WITHIN INDEXES  Auditory Memory Index  Subtest Scaled Score AMI Mean Score Difference from Mean Critical Value Base Rate  Logical Memory I 7 6.00 1.00 2.64 >25%  Logical Memory II 6 6.00 0.00 2.48 >25%  [facility] I 5 6.00 -1.00 1.90 >25%  [facility] II 6 6.00 0.00 2.48 >25%  Statistical significance (critical value) at the .05 level.   Visual Memory Index  Subtest Scaled Score VMI Mean Score Difference from Mean Critical Value Base Rate  Designs I 5 7.50 -2.50 2.38 10-15%  Designs II 6 7.50 -1.50 2.38 >25%  Visual Reproduction I 8 7.50 0.50 1.86 >25%  Visual Reproduction II 11 7.50 3.50 1.48 5-10%  Statistical significance (critical value) at the .05 level.   Immediate Memory Index  Subtest Scaled Score IMI Mean Score Difference from Mean Critical Value Base Rate  Logical Memory I 7 6.25 0.75 2.59 >25%  [facility] I 5 6.25 -1.25 1.82 >25%  Designs I 5 6.25 -1.25 2.42 >25%  Visual Reproduction I 8 6.25 1.75 1.91 >25%  Statistical significance (critical value) at the .05 level.   Delayed Memory Index  Subtest Scaled Score DMI Mean Score  Difference from Mean Critical Value Base Rate  Logical Memory II 6 7.25 -1.25 2.44 >25%  [facility] II 6 7.25 -1.25 2.44 >25%  Designs II 6 7.25 -1.25 2.44 >25%  Visual Reproduction II 11 7.25 3.75 1.57 5-10%  Statistical significance (critical value) at the .05 level.    SUBTEST DISCREPANCY COMPARISON  Comparison Score 1 Score 2 Difference Critical Value Base Rate  Spatial Addition - Symbol Span 8 5 3  2.74 41.8  Statistical significance (critical value) at the .05 level.    SUBTEST-LEVEL CONTRAST SCALED SCORES  Logical Memory  Score Score 1 Score 2 Contrast Scaled Score  LM II Recognition vs. Delayed Recall 10-16% 6 9  LM Immediate Recall vs. Delayed Recall 7 6 7    [facility]  Score Score 1 Score 2 Contrast Scaled Score  VPA II Recognition vs. Delayed Recall 26-50% 6 6  VPA Immediate Recall vs. Delayed Recall 5 6 11    Designs  Score Score 1 Score 2 Contrast Scaled Score  DE I Spatial vs. Content 10 4 2   DE II Spatial vs. Content 10 4 3   DE II Recognition vs. Delayed Recall 10-16% 6 8  DE Immediate Recall vs. Delayed Recall 5 6 9    Visual Reproduction  Score Score 1 Score 2 Contrast Scaled Score  VR II Recognition vs. Delayed Recall 51-75% 11 11  VR Immediate Recall vs. Delayed Recall 8 11 13     INDEX-LEVEL CONTRAST SCALED SCORES  WMS-IV Indexes  Score Score 1 Score 2 Contrast Scaled Score  Auditory Memory Index vs. Visual Memory Index 77 85 9  Visual Working Memory Index vs. Visual Memory Index 80 85 9  Immediate Memory Index vs. Delayed Memory Index 75 81 10    RAW SCORES  Subtest Score Range Adult Score Range Older Adult Raw Score  Brief Cognitive Status Exam 0-58 0-58 44  Logical Memory I 0-50 0-53 18  Logical Memory II 0-50 0-39 12  [facility] I 0-56 0-40 12  [facility] II 0-14 0-10 5  CVLT-II Trials 1-5 5-95 5-95   CVLT-II Long-Delay -5-5 -5-5   Designs I 0-120  45  Designs II 0-120  36    Visual Reproduction I 0-43 0-43 30  Visual Reproduction II 0-43 0-43 25  Spatial Addition 0-24  8  Symbol Span 0-50 0-50 10  Process Score Range Adult Score Range Older Adult Raw Score  LM II Recognition 0-30 0-23 21  VPA II Recognition 0-40 0-30 38  VPA II Word Recall 0-28 0-20   DE I Content 0-48  23  DE I Spatial 0-24  16  DE II Content 0-48  20  DE II Spatial 0-24  12  DE II Recognition 0-24  11  VR II Recognition 0-7 0-7 6  VR II Copy 0-43 0-43     ABILITY-MEMORY ANALYSIS  Ability Score:  GAI: 80 Date of Testing:  WAIS-IV; WMS-IV 2019/01/20  Predicted Difference Method   Index Predicted WMS-IV Index Score Actual WMS-IV Index Score Difference Critical Value  Significant Difference Y/N Base Rate  Auditory Memory 89 77 12 8.95 Y 15-20%  Visual Memory 88 85 3 8.82 N   Visual Working Memory 87 80 7 11.24 N   Immediate Memory 87 75 12 10.35 Y 15%  Delayed Memory 88 81 7 10.08 N   Statistical significance (critical value) at the .01 level.   Contrast Scaled Scores  Score Score 1 Score 2 Contrast Scaled Score  General Ability Index vs. Auditory Memory Index 80 77 7  General Ability Index vs. Visual Memory Index 80 85 9  General Ability Index vs. Visual Working Memory Index 80 80 8  General Ability Index vs. Immediate Memory Index 80 75 6  General Ability Index vs. Delayed Memory Index 80 81 7  Verbal Comprehension Index vs. Auditory Memory Index 80 77 7  Perceptual Reasoning Index vs. Visual Memory Index 84 85 9  Perceptual Reasoning Index vs. Visual Working Memory Index 84 80 9  Working Memory Index vs. Auditory Memory Index 92 77 6  Working Memory Index vs. Visual Working Memory Index 92 80 6    End of Report

## 2019-01-24 ENCOUNTER — Encounter: Payer: Self-pay | Admitting: Psychology

## 2019-01-24 NOTE — Progress Notes (Signed)
BEHAVIOR OBSERVATIONS: Patient was on time to his 8:00am testing appointment and was accompanied by his daughter. He was administered the Wechsler Memory Scale, 4th Edition (Adult Battery) and Wechsler Adult Intelligence Scale, 4th edition, which lasted around 240 minutes. He was appropriately dressed and adequately groomed. He displayed an appropriate level of cooperation and motivation. His participation was indicative of appropriate and redirectable behaviors. English as a second language appeared to impact testing performance on language based measures, which was likely compounded by hemorrhage to left temporal lobe. He had trouble understanding test instructions and required additional prompting and multiple repetitions to ensure adequate comprehension. Mood was euthymic overall. Affect was appropriate and congruent with mood.   Results of the WAIS-IV are as follows:  Composite Score Summary  Scale Sum of Scaled Scores Composite Score Percentile Rank 95% Conf. Interval Qualitative Description  Verbal Comprehension 19 VCI 80 9 75-86 Low Average  Perceptual Reasoning 22 PRI 84 14 79-91 Low Average  Working Memory 17 WMI 92 30 86-99 Average  Processing Speed 11 PSI 76 5 70-87 Borderline  Full Scale 69 FSIQ 79 8 75-83 Borderline  General Ability 41 GAI 80 9 76-86 Low Average    Comparison Score 1 Score 2 Difference Critical Value .05 Significant Difference Y/N Base Rate by Overall Sample  VCI - WMI 80 92 -12 8.82 Y 18.1  WMI - PSI 92 76 16 11.38 Y 14.9   DETERMINING STRENGTHS AND WEAKNESSES   Differences Between Subtest and Overall Mean of Subtest Scores  Subtest Subtest Scaled Score Mean Scaled Score Difference Critical Value .05 Strength or Weakness Base Rate  Block Design 7 6.90 0.10 2.85  >25%  Similarities 6 6.90 -0.90 2.82  >25%  Digit Span 6 6.90 -0.90 2.22  >25%  Matrix Reasoning 8 6.90 1.10 2.54  >25%  Vocabulary 5 6.90 -1.90 2.03  >25%  Arithmetic 11 6.90 4.10 2.73 S  2-5%  Symbol Search 4 6.90 -2.90 3.42  15-25%  Visual Puzzles 7 6.90 0.10 2.71  >25%  Information 8 6.90 1.10 2.19  >25%  Coding 7 6.90 0.10 2.97  >25%    Results of the WMS-IV are as follows:  Brief Cognitive Status Exam Classification  Age Years of Education Raw Score Classification Level Base Rate  57 years 3 months 18 44 Low 3.5    Index Score Summary  Index Sum of Scaled Scores Index Score Percentile Rank 95% Confidence Interval Qualitative Descriptor  Auditory Memory (AMI) 24 77 6 72-84 Borderline  Visual Memory (VMI) 30 85 16 80-91 Low Average  Visual Working Memory (VWMI) 13 80 9 74-89 Low Average  Immediate Memory (IMI) 25 75 5 70-82 Borderline  Delayed Memory (DMI) 29 81 10 75-89 Low Average   Primary Subtest Scaled Score Summary  Subtest Domain Raw Score Scaled Score Percentile Rank  Logical Memory I AM 18 7 16   Logical Memory II AM 12 6 9   [facility] I AM 12 5 5   [facility] II AM 5 6 9   Designs I VM 45 5 5  Designs II VM 36 6 9  Visual Reproduction I VM 30 8 25   Visual Reproduction II VM 25 11 63  Spatial Addition VWM 8 8 25   Symbol Span VWM 10 5 5     Auditory Memory Process Score Summary  Process Score Raw Score Scaled Score Percentile Rank Cumulative Percentage (Base Rate)  LM II Recognition 21 - - 10-16%  VPA II Recognition 38 - - 26-50%  Visual Memory Process Score Summary  Process Score Raw Score Scaled Score Percentile Rank Cumulative Percentage (Base Rate)  DE II Recognition 11 - - 10-16%  VR II Recognition 6 - - 51-75%

## 2019-01-27 ENCOUNTER — Encounter: Payer: Self-pay | Admitting: Adult Health

## 2019-01-27 ENCOUNTER — Ambulatory Visit: Payer: Managed Care, Other (non HMO) | Admitting: Adult Health

## 2019-01-27 VITALS — BP 113/72 | HR 53 | Ht 64.0 in | Wt 157.4 lb

## 2019-01-27 DIAGNOSIS — I69319 Unspecified symptoms and signs involving cognitive functions following cerebral infarction: Secondary | ICD-10-CM | POA: Diagnosis not present

## 2019-01-27 DIAGNOSIS — I69398 Other sequelae of cerebral infarction: Secondary | ICD-10-CM

## 2019-01-27 DIAGNOSIS — I611 Nontraumatic intracerebral hemorrhage in hemisphere, cortical: Secondary | ICD-10-CM | POA: Diagnosis not present

## 2019-01-27 DIAGNOSIS — I10 Essential (primary) hypertension: Secondary | ICD-10-CM

## 2019-01-27 DIAGNOSIS — E119 Type 2 diabetes mellitus without complications: Secondary | ICD-10-CM

## 2019-01-27 DIAGNOSIS — I82461 Acute embolism and thrombosis of right calf muscular vein: Secondary | ICD-10-CM

## 2019-01-27 DIAGNOSIS — H53461 Homonymous bilateral field defects, right side: Secondary | ICD-10-CM

## 2019-01-27 DIAGNOSIS — R4701 Aphasia: Secondary | ICD-10-CM

## 2019-01-27 NOTE — Progress Notes (Signed)
I agree with the above plan 

## 2019-01-27 NOTE — Patient Instructions (Addendum)
Continue Eliquis (apixaban) daily for secondary stroke prevention  Continue to follow up with PCP regarding cholesterol, blood pressure and diabetes management   Follow up with Dr. Sima Matas as scheduled   Continue to follow up with eye doctor for ongoing visual deficits   Continue to monitor chill sensation symptoms  Continue to monitor blood pressure at home with recordings - schedule sooner appt with cardiology in regards to blood pressure and heart rate control medications   Maintain strict control of hypertension with blood pressure goal below 130/90, diabetes with hemoglobin A1c goal below 6.5% and cholesterol with LDL cholesterol (bad cholesterol) goal below 70 mg/dL. I also advised the patient to eat a healthy diet with plenty of whole grains, cereals, fruits and vegetables, exercise regularly and maintain ideal body weight.  Followup in the future with me in 6 months or call earlier if needed       Thank you for coming to see [place] at [facility]. I hope we have been able to provide you high quality care today.  You may receive a patient satisfaction survey over the next few weeks. We would appreciate your feedback and comments so that we may continue to improve ourselves and the health of our patients.

## 2019-01-30 ENCOUNTER — Ambulatory Visit: Payer: Managed Care, Other (non HMO) | Admitting: Psychology

## 2019-01-30 ENCOUNTER — Telehealth: Payer: Self-pay

## 2019-01-30 NOTE — Telephone Encounter (Signed)
Driver rehab evaluation report put in [place]  NP inbasket for review.

## 2019-02-04 ENCOUNTER — Telehealth: Payer: Self-pay | Admitting: Physical Medicine & Rehabilitation

## 2019-02-04 NOTE — Telephone Encounter (Signed)
Please see document in your folder patient needs letter of accomodation for - please generate and I will attach and fax

## 2019-02-20 NOTE — Telephone Encounter (Signed)
Reviewed visit encounter from driver rehabilitation evaluation on 01/29/2019.  Per visit notes, information is as follows  Assessment/conclusion/recommendations: "Hector Pugh is a 58 year old gentleman seen in the driving rehabilitation clinic for an OT evaluation of his driving skills.  He performed while in the clinical behind the wheel assessment.  He demonstrated safe driving behaviors in light, moderate and interstate traffic this day.  It is my recommendation that Hector Pugh proceed with driving under the supervision of a licensed, competent driver for the next 30 days.  This timeframe will right behind the wheel practice with supervisor giving cues as needed due to right-sided vision loss.  I will follow-up with his clients family in 1 month to check on his progress.  Additional drivers training may be necessary if issues arise.  If no problems or concerns arise during those 30 days, the client should then proceed with driving independence.  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 [place], [place] [address]  Phone [phone] Fax [phone] Note: This document was prepared with digital dictation and possible smart phrase technology. Any transcriptional errors that result from this process are unintentional.

## 2019-02-27 ENCOUNTER — Ambulatory Visit: Payer: Managed Care, Other (non HMO) | Admitting: Psychology

## 2019-03-04 ENCOUNTER — Encounter: Payer: Managed Care, Other (non HMO) | Attending: Physical Medicine & Rehabilitation | Admitting: Psychology

## 2019-03-04 ENCOUNTER — Other Ambulatory Visit: Payer: Self-pay

## 2019-03-04 ENCOUNTER — Encounter: Payer: Self-pay | Admitting: Psychology

## 2019-03-04 DIAGNOSIS — F09 Unspecified mental disorder due to known physiological condition: Secondary | ICD-10-CM

## 2019-03-04 DIAGNOSIS — I6990 Unspecified sequelae of unspecified cerebrovascular disease: Secondary | ICD-10-CM

## 2019-03-04 DIAGNOSIS — I69120 Aphasia following nontraumatic intracerebral hemorrhage: Secondary | ICD-10-CM | POA: Insufficient documentation

## 2019-03-04 DIAGNOSIS — Z8507 Personal history of malignant neoplasm of pancreas: Secondary | ICD-10-CM | POA: Diagnosis present

## 2019-03-04 DIAGNOSIS — I69151 Hemiplegia and hemiparesis following nontraumatic intracerebral hemorrhage affecting right dominant side: Secondary | ICD-10-CM | POA: Diagnosis present

## 2019-03-04 DIAGNOSIS — Z90411 Acquired partial absence of pancreas: Secondary | ICD-10-CM | POA: Insufficient documentation

## 2019-03-04 DIAGNOSIS — G8929 Other chronic pain: Secondary | ICD-10-CM | POA: Insufficient documentation

## 2019-03-04 DIAGNOSIS — I611 Nontraumatic intracerebral hemorrhage in hemisphere, cortical: Secondary | ICD-10-CM | POA: Diagnosis not present

## 2019-03-04 DIAGNOSIS — I10 Essential (primary) hypertension: Secondary | ICD-10-CM | POA: Insufficient documentation

## 2019-03-04 DIAGNOSIS — M545 Low back pain: Secondary | ICD-10-CM | POA: Insufficient documentation

## 2019-03-04 DIAGNOSIS — I4891 Unspecified atrial fibrillation: Secondary | ICD-10-CM | POA: Insufficient documentation

## 2019-03-04 NOTE — Progress Notes (Signed)
Today I provided feedback regarding the results of the recent neuropsychological evaluation.  The full report can be found in his epic chart with the appointment visit date 01/23/2019.  Today we provided feedback with the patient and his daughter.  This feedback was conducted in person.  Below is a copy of the summary and interpretation of that evaluation for efficiency.  The patient continues to be quite impulsive and we addressed this issue and how these longstanding personality traits could negatively affect his rehabilitation and progression efforts.  The patient appeared to comprehend and understand what was being given to him and we develop some strategies for continuing his improvement.  We have set the patient up for follow-up neuropsychological testing in January 2021.  At this point he is not returning to work yet but does have that option once he shows further recovery.  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  I talked with him about the concerns and areas where he needs to be particularly focused on to avoid having any types of accidents.    Summary of Results:                        Overall, the results of the current neuropsychological evaluation do suggest significant profound deficits relative to predicted levels of previous functioning.  There are significant residual/late effects of his cerebrovascular accident.  These effects are not simply focal deficits on specific areas or specific hemispheric functions but are identified for a broad range of neuropsychological functioning that would encompass most areas of cortically mediated cognitive functioning.  The patient's best area of functioning had to do with his auditory encoding/working memory which falls at the 30th percentile and was in the average range.  However even this score is likely to be significantly below premorbid  functioning.  The patient's visual encoding/visual working memory was significantly below his auditory encoding memory with visual working memory in the bottom end of the low average range.  The patient showed significant relative deficits to a normative population with regard to verbal comprehension abilities including verbal reasoning and problem-solving and vocabulary.  The patient's current general fund of information and social judgment/comprehension are in the average range and he retains the ability to do well as far as his basic general fund of information and social skills abilities.  The patient showed deficits with regard to visual spatial abilities and some aspects of visual reasoning and problem-solving.  The patient's information processing speed was his most impaired area of cognitive functioning with the exception of his auditory memory deficits.  The patient showed significant impairments with regard to information processing speed and visual scanning/visual searching abilities.  With regard to specific memory deficits there are significant memory deficits noted.  The patient did generally well with regard to auditory encoding abilities but had some significant difficulties with visual encoding abilities.  However, the patient's auditory memory both immediate as well as delayed or significantly impaired.  Is generally well preserved auditory encoding versus significant deficits with regard to both immediate and delayed auditory memory suggest that his memory deficits are not related to significant inability to attend and encode the information but deficits with regard to organization and storage.  This also does not appear to be a simple retrieval deficit but related to storage and organization of information to be learned.  The pattern showed less significant deficits with regard to visual memory and retrieval and while they were in the low average range they are significantly below what would  be predicted with regard to premorbid functioning.  Impression/Diagnosis:                     Overall, the results of the current objective neuropsychological evaluation are consistent with significant residual cognitive deficits.  While the patient has voiced strong desire to return to work as soon as he can, the current level of cognitive deficits in a broad range of neuropsychological areas would suggest that the patient is not ready to return to work at this time.  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  While the patient's social judgment and general fund of information are performing in the average range and he is able to show appropriate interpersonal functioning and good impulse control the patient is not likely to be able to perform the cognitive tasks of his job.  Also, the patient has reported a strong desire to begin driving again and the level of visual-spatial deficits and significant impairments with regard to information processing speed would also make that quite difficult and the patient is likely to be unable to effectively perform the cognitive demands needed for safe driving.  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  The patient is continuing to show improvement and I would highly recommend ongoing and continuing rehabilitation efforts and is important that the patient remain as engaged as possible and day-to-day activities as well as cognitively stimulating activities.  We are still less than 1 year post temporal lobe hemorrhage and the patient is well within time constraints that would allow for further improvements in his overall cognitive functioning.  We will need to  do repeat testing in approximately 9 months to objectively assess this improvement to help guide the patient in his decision making as far as returning to work and driving.  At this point right now the patient should not be returning to his previous job and should not be driving.  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 Neuropsychologist

## 2019-03-10 ENCOUNTER — Ambulatory Visit: Payer: Managed Care, Other (non HMO) | Admitting: Adult Health

## 2019-04-02 ENCOUNTER — Other Ambulatory Visit: Payer: Self-pay

## 2019-04-02 ENCOUNTER — Encounter: Payer: Self-pay | Admitting: Physical Medicine & Rehabilitation

## 2019-04-02 ENCOUNTER — Encounter
Payer: Managed Care, Other (non HMO) | Attending: Physical Medicine & Rehabilitation | Admitting: Physical Medicine & Rehabilitation

## 2019-04-02 DIAGNOSIS — I6990 Unspecified sequelae of unspecified cerebrovascular disease: Secondary | ICD-10-CM | POA: Diagnosis not present

## 2019-04-02 DIAGNOSIS — F09 Unspecified mental disorder due to known physiological condition: Secondary | ICD-10-CM | POA: Insufficient documentation

## 2019-04-02 DIAGNOSIS — R4701 Aphasia: Secondary | ICD-10-CM

## 2019-04-02 DIAGNOSIS — I69151 Hemiplegia and hemiparesis following nontraumatic intracerebral hemorrhage affecting right dominant side: Secondary | ICD-10-CM | POA: Insufficient documentation

## 2019-04-02 DIAGNOSIS — I10 Essential (primary) hypertension: Secondary | ICD-10-CM | POA: Insufficient documentation

## 2019-04-02 DIAGNOSIS — Z90411 Acquired partial absence of pancreas: Secondary | ICD-10-CM | POA: Insufficient documentation

## 2019-04-02 DIAGNOSIS — Z8507 Personal history of malignant neoplasm of pancreas: Secondary | ICD-10-CM | POA: Insufficient documentation

## 2019-04-02 DIAGNOSIS — G8929 Other chronic pain: Secondary | ICD-10-CM | POA: Insufficient documentation

## 2019-04-02 DIAGNOSIS — I69120 Aphasia following nontraumatic intracerebral hemorrhage: Secondary | ICD-10-CM | POA: Insufficient documentation

## 2019-04-02 DIAGNOSIS — M545 Low back pain: Secondary | ICD-10-CM | POA: Insufficient documentation

## 2019-04-02 DIAGNOSIS — I4891 Unspecified atrial fibrillation: Secondary | ICD-10-CM | POA: Insufficient documentation

## 2019-04-02 MED ORDER — BACLOFEN 10 MG PO TABS: 10.0000 mg | ORAL_TABLET | Freq: Every day | ORAL | 3 refills | Status: DC | PRN

## 2019-04-02 NOTE — Progress Notes (Signed)
Subjective:    Patient ID: Hector Pugh, male    DOB: September 27, 1961, 58 y.o.   MRN: [id number]  TELEHEALTH NOTE  Due to national recommendations of social distancing due to COVID 19, an audio/video telehealth visit is felt to be most appropriate for this patient at this time.  See Chart message from today for the patient's consent to telehealth from [place].     I verified that I am speaking with the correct person using two identifiers.  Location of patient: Home Location of provider: Office Method of communication: Telephone Names of participants : Zorita Pang scheduling, Wenda Overland obtaining consent and vitals if available Established patient Time spent on call: 30 minutes  HPI  Male with history of pancreatic cancer, T2DM, HTN presents for follow up for left temporal lobe hemorrhage.   Daughter provides history. Last clinic visit 11/29/2019.  Limited historian. Since that time, pt states he has been doing HEP.  He followed up with Neurology, dysesthesias self-resolved. Eliquis was d/ced.  BP meds were d/ced.  He followed up for driving rehab and was cleared. He saw Neuropsych, notes reviewed, discussed with daughter. He notes urinary freq on Flomax that he was recently started on. Back pain is intermittent.   Pain Inventory Average Pain 0 Pain Right Now 0 My pain is no pain  In the last 24 hours, has pain interfered with the following? General activity 0 Relation with others 0 Enjoyment of life 0 What TIME of day is your pain at its worst? no pain Sleep (in general) Good  Pain is worse with: no pain Pain improves with: no pain Relief from Meds: no pain  Mobility walk without assistance ability to climb steps?  yes do you drive?  no has been cleared to drive but they have not let him at the current time  Function disabled: date disabled . I need assistance with the following:  meal prep, household duties and shopping   Neuro/Psych No problems in this area  Prior Studies Any changes since last visit?  no  Physicians involved in your care Any changes since last visit?  no   Family History  Problem Relation Age of Onset  . Cancer Mother        esophageal carcinoma   Social History   Socioeconomic History  . Marital status: Married    Spouse name: Not on file  . Number of children: Not on file  . Years of education: Not on file  . Highest education level: Not on file  Occupational History  . Not on file  Social Needs  . Financial resource strain: Not on file  . Food insecurity:    Worry: Not on file    Inability: Not on file  . Transportation needs:    Medical: Not on file    Non-medical: Not on file  Tobacco Use  . Smoking status: Never Smoker  . Smokeless tobacco: Never Used  Substance and Sexual Activity  . Alcohol use: No  . Drug use: No  . Sexual activity: Never  Lifestyle  . Physical activity:    Days per week: Not on file    Minutes per session: Not on file  . Stress: Not on file  Relationships  . Social connections:    Talks on phone: Not on file    Gets together: Not on file    Attends religious service: Not on file    Active member of club or organization: Not on  file    Attends meetings of clubs or organizations: Not on file    Relationship status: Not on file  Other Topics Concern  . Not on file  Social History Narrative   Lives with wife, daughter Hector Pugh   Past Surgical History:  Procedure Laterality Date  . circucision    . circumsision    . COLONOSCOPY N/A 04/04/2018   Procedure: COLONOSCOPY;  Surgeon: Leighton Ruff, MD;  Location: [facility];  Service: Endoscopy;  Laterality: N/A;  . CRANIOTOMY Left 04/05/2018   Procedure: Left Temporal CRANIOTOMY HEMATOMA EVACUATION of Intracranial Hemorrhage;  Surgeon: Ashok Pall, MD;  Location: [facility];  Service: Neurosurgery;  Laterality: Left;  . ERCP  11/08/2012   Procedure: ENDOSCOPIC RETROGRADE  CHOLANGIOPANCREATOGRAPHY (ERCP);  Surgeon: Jeryl Columbia, MD;  Location: [facility];  Service: Gastroenterology;  Laterality: N/A;  . EUS  11/08/2012   Procedure: UPPER ENDOSCOPIC ULTRASOUND (EUS) LINEAR;  Surgeon: Arta Silence, MD;  Location: [facility];  Service: Endoscopy;  Laterality: N/A;  . FINE NEEDLE ASPIRATION  11/08/2012   Procedure: FINE NEEDLE ASPIRATION (FNA) LINEAR;  Surgeon: Arta Silence, MD;  Location: [facility];  Service: Endoscopy;  Laterality: N/A;  . HERNIA REPAIR     RIGHT  . LAPAROSCOPY  12/12/2012   Procedure: LAPAROSCOPY DIAGNOSTIC;  Surgeon: Stark Klein, MD;  Location: [facility];  Service: General;  Laterality: N/A;  . Left Foot Surgery    . Left Foot Surgery    . VASECTOMY    . WHIPPLE PROCEDURE  12/12/2012   Procedure: WHIPPLE PROCEDURE;  Surgeon: Stark Klein, MD;  Location: [facility];  Service: General;  Laterality: N/A;   Past Medical History:  Diagnosis Date  . 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 Cancer ([place])   . Diabetes mellitus ([place]) 11/10/2012  . Diabetes mellitus without complication ([facility])    diet controlled  . HTN (hypertension) 11/10/2012  . Hypertension   . 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 Pancreatic cancer ([place])   . Pancreatitis 11/10/2012  . Seasonal allergies    There were no vitals taken for this visit.  Opioid Risk Score:   Fall Risk Score:  `1  Depression screen PHQ 2/9  Depression screen [facility] 2/9 04/02/2019 05/09/2018  Decreased Interest 0 0  Down, Depressed, Hopeless 0 1  PHQ - 2 Score 0 1  Altered sleeping - 0  Tired, decreased energy - 1  Change in appetite - 0  Feeling bad or failure about yourself  - 0  Trouble concentrating - 1  Moving slowly or fidgety/restless - 0  Suicidal thoughts - 0  PHQ-9 Score - 3  Difficult doing work/chores - Not difficult at all   Review of Systems  Constitutional: Positive for chills and unexpected weight change.  HENT: Negative.   Respiratory:  Negative.   Cardiovascular: Negative.   Gastrointestinal: Negative.   Endocrine: Negative.   Genitourinary: Positive for difficulty urinating.  Musculoskeletal: Negative.   Skin: Negative.   Allergic/Immunologic: Negative.   Neurological: Negative.   Hematological: Negative.   Psychiatric/Behavioral: Negative.   All other systems reviewed and are negative.     Objective:   Physical Exam Gen: NAD. Pulm: Effort normal Neuro: Alert and oriented    Assessment & Plan:  58 year old male with history of pancreatic cancer, T2DM, HTN presents for follow up for left temporal lobe hemorrhage.   1. 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  2. DVT right gastroc and soleal veins  Eliquis d/ced per Neuro/PCP  3. Chronic low back pain:   [place]il Robaxin 500 again   Encouraged stretching/core strengthening exercise  Exacerbated by excessive activity  May consider Baclofen 10 daily PRN if necessary, ordered  4. Coping  Cont to follow up with Neuropsych

## 2019-07-21 ENCOUNTER — Telehealth: Payer: Self-pay | Admitting: Adult Health

## 2019-07-21 NOTE — Telephone Encounter (Signed)
I called patient and LVM to discuss converting 8/24 am appointment to a virtual visit or to reschedule to a different day for an in office visit due to schedule changes. Requested patient call back.

## 2019-07-28 ENCOUNTER — Encounter: Payer: Self-pay | Admitting: Adult Health

## 2019-07-28 ENCOUNTER — Telehealth (INDEPENDENT_AMBULATORY_CARE_PROVIDER_SITE_OTHER): Payer: Managed Care, Other (non HMO) | Admitting: Adult Health

## 2019-07-28 DIAGNOSIS — I611 Nontraumatic intracerebral hemorrhage in hemisphere, cortical: Secondary | ICD-10-CM | POA: Diagnosis not present

## 2019-07-28 DIAGNOSIS — E119 Type 2 diabetes mellitus without complications: Secondary | ICD-10-CM

## 2019-07-28 DIAGNOSIS — I10 Essential (primary) hypertension: Secondary | ICD-10-CM

## 2019-07-28 NOTE — Progress Notes (Signed)
[facility] [address] [place]. [place] [address] [phone]       FOLLOW UP NOTE  [name] Date of Birth:  06-30-1961 Medical Record Number:  [id number]   Reason for visit: ICH follow up  Virtual Visit via Video Note  I connected with Hector Pugh on 07/28/19 at  8:45 AM EDT by a video enabled telemedicine application due to [id number] safety precautions and verified that I am speaking with the correct person using two identifiers accompanied by his daughter.  Location: Patient: Home Provider: Office at [facility]   I discussed the limitations of evaluation and management by telemedicine and the availability of in person appointments. The patient expressed understanding and agreed to proceed.    HPI:  Stroke admission 04/05/2018: Hector Pugh a 58 y.o.malewith a history of DM, s/p whipple in 2012 who was lastknown well before he went to bed last night 04/05/2018 around 8:30 PM.When his wife left for work at [place] 04/06/2018, she noticed that he was slurring his speech, however he did not have any clear deficits. He went downstairs sometime between 5 and 8:30 AM and started to make tea.Subsequently when his daughter arrived home about 8:30 AM he was in bed with vomit and not moving his right side as well. He has brought to the emergency department as a code stroke where CT revealed large temporal hemorrhage. Premorbid modified rankin scale:0. ICH Score:0. Dr. Christella Noa, neurosurgeon, consulted and took to the OR for a left temporal craniotomy for hematoma evacuation.He was taken to OR emergently for left craniotomy and evacuation of hemorrhage by Dr. Christella Noa. Post op placed on Keppra for seizure prophylaxis and started on IV Zosyn due to concerns of aspiration PNA and completed treatment on 5/7.  Hospital course significant for cerebral edema requiring hypertonic saline, coagulopathy with drop in platelets to 113 and INR 1.4, A fib  with RVR and labile BP. He was weaned off cleviprex by 5/8 and hypertonic saline by 5/9.  Serial CCT monitored with CT 5/9 showing evolving left temporal lobe with decrease in volume, increase in localized vasogenic edema with partial effacement of left lateral ventricle, stable infarct in left posterior temporal occipital lobe and no hydrocephalus.  He did develop lethargy later that day and hypertonic saline as well as Cleviprex resumed.  CT head on 5/11 reviewed, stable.  Dr. Christella Noa evaluated films and felt that no significant changes noted and no need to check further CT as patient neurologically stable. Hypertonic saline and clevidipine weaned off and BP/neuro status stable.    Patient was admitted to CIR on 04/15/2018 and discharged home on 04/27/2018.  Recommended continuation of Keppra for 2 weeks total which was completed prior to being discharged from CIR and was seizure-free during his day.  Evidence during CIR stay of right gastrocnemius and soleal vein DVT and Eliquis initiated for management.  He continued to have visual deficits with impaired safety awareness and receptive aphasia according to discharge notes and recommended continuing outpatient PT/OT/ST.    05/29/2018 visit: Patient is being seen today for hospital follow-up and is accompanied by his son.  He continues to participate in PT/OT/ST at our neuro rehab clinic.  States his right side has been improving but continues to have visual deficits with aphasia.  He continues take Eliquis without bleeding or bruising.  Blood pressure satisfactory at 100/62.  Patient has gradually started to participate in previous activities except for driving due to visual loss.  Denies worsening vision or headaches.  Denies new or worsening stroke/TIA symptoms at this time.  Interval history 09/06/2018: Patient is being seen today for scheduled follow-up visit and is accompanied by his daughter.  He has since completed speech therapy as they have felt he has  reached his therapy plateau.  They also recommended undergoing neuropsych testing for continued cognitive linguistic deficits.  Per notes, cognitive deficits concerns are decreased attention to detail, decreased skills and alternating and divided attention, and decreased emergent awareness skills.  They believe that his continued cognition would not allow him to work.  OT has also been completed and he was deemed not safe to drive at that time and recommended to be evaluated by neuro-ophthalmologist in the future.  He continues to have deficits of right homonymous hemianopia and will be undergoing visual field testing next week.  Daughter has contacted Dr. Posey Pronto in order to obtain neurocognitive testing for continued cognitive deficits such as inattention and difficulty multitasking.  Daughter states that she has not noticed any concerns with his memory.  Continues to have mild expressive aphasia but this is also improved.  She also believes that there possibly could be some hearing loss that has been more pronounced recently but is unsure if this is actual hearing loss versus inattention.  She is considering to have patient undergo formal hearing exam.  He continues to live at home with his wife and daughter and he continues to stay active around his home.  He has not returned to work at this time but daughter is requesting a job description note from his work and have Dr. Posey Pronto assess whether he is able to continue to perform current job.  Patient has not returned to driving at this time.  He continues to take Eliquis without bleeding or bruising.  Blood pressure today satisfactory 111/69.  Patient does state that he does monitor blood pressure in the evening and typical SBP ranges 1 30-1 40 but unsure if he is obtaining readings at appropriate times and/or positions and this was reviewed with patient along with daughter he will continue to monitor.  Patient has not been reevaluated by neurosurgery for possible  further imaging as daughter wanted to defer prior due to multiple appointments.  Daughter plans on calling neurosurgery at time for possible appointment for need of further imaging.  Denies new or worsening stroke/TIA symptoms.  Interval history 01/27/2019: Hector Pugh is a 58 year old male who is being seen today for stroke follow-up and is accompanied by his daughter who provides majority of history.  He was evaluated by Dr. Sima Matas on 12/06/2018 and recommended formal neurocognitive evaluation which was performed on 01/23/2019.  Evaluation results consistent with significant residual cognitive deficits and it was not recommended for patient to return to work as he would likely have significant profound difficulty performing his work duties.  It was also recommended for patient not to return to driving due to visual deficits and impairment regarding information processing speed which would affect cognitive demands needed for safe driving.   He continues to have residual stroke deficits of right homonymous hemianopia, aphasia and cognitive deficits.  He was evaluated by ophthalmology last week and per daughter, no visual changes compared to prior exam. He continues on Eliquis without side effects of bleeding or bruising.  After discussion with daughter and review of notes, Eliquis initiated for evidence of DVT which was found during CIR admission.  Atrial fibrillation in RVR was questionable during admission and therefore Eliquis was not initiated for atrial fibrillation.  He  does continue to follow with cardiology in [facility], [place] Dr. Dillon Bjork and daughter believes appointment is scheduled sometime this summer.  Per daughter, cardiology deferred duration of Eliquis to our office.  At this time, he has been on Eliquis therapy for approximately 9 months.  He has not had any repeat lower extremity venous Dopplers since initial DVT finding.  She is also questioning need of continuation of antihypertensives and  metoprolol as blood pressure stable and heart rate typically on the lower side.  Blood pressure today satisfactory at 113/72 which is his normal level as he does continue to monitor at home.  No further concerns at this time.  Denies new or worsening stroke/TIA symptoms.  He does have complaints of left-sided "chill" sensation that goes up the left side of his body all the way up to his neck and crosses over to the right side of his head and will only last for a few seconds.  He denies any burning sensation, electrical shock sensation or any other painful symptoms.  He denies any headaches/migraines or any other new/worsening neurological symptom.  This has been occurring approx 2 months after returning home from initial [place] admission and occurs approximately 1-2 times per month.  Daughter states that a couple weeks ago these symptoms were consistent for 3-4 days and even kept him up at night.  Denies any increased stress or sickness at that time.  Since then, he denies recurrence of the symptoms.    07/28/2019 virtual visit update: Hector Pugh is a 58 year old male who is being seen today for stroke follow-up.  Residual deficits of decreased vision, occasional word finding difficulty and left-sided intermittent sensation.  He does endorse overall improvement since prior visit and has even since returned to driving with family member present which was approved by driving rehab.  Left-sided intermittent sensation described above with some improvement of symptom frequency.  He continues to stay active with activities around his home and exercising daily.  Previously on Eliquis for evidence of DVT which was discontinued around March by cardiology and PCP.  He continues to follow with cardiology and [place], [place] and PCP regularly.  Currently on rosuvastatin 10 mg daily without side effects myalgias.  Blood pressure monitored routinely which has been stable.  Recently started on sertraline by PCP with improvement of  overall mood.  Denies new or worsening stroke/TIA symptoms.     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   Pancreatic cancer ([place])    Pancreatitis 11/10/2012   Seasonal allergies     PSH:  Past Surgical History:  Procedure Laterality Date   circucision     circumsision     COLONOSCOPY N/A 04/04/2018   Procedure: COLONOSCOPY;  Surgeon: Leighton Ruff, MD;  Location: [facility];  Service: Endoscopy;  Laterality: N/A;   CRANIOTOMY Left 04/05/2018   Procedure: Left Temporal CRANIOTOMY HEMATOMA EVACUATION of Intracranial Hemorrhage;  Surgeon: Ashok Pall, MD;  Location: [facility];  Service: Neurosurgery;  Laterality: Left;   ERCP  11/08/2012   Procedure: ENDOSCOPIC RETROGRADE CHOLANGIOPANCREATOGRAPHY (ERCP);  Surgeon: Jeryl Columbia, MD;  Location: [facility];  Service: Gastroenterology;  Laterality: N/A;   EUS  11/08/2012  Procedure: UPPER ENDOSCOPIC ULTRASOUND (EUS) LINEAR;  Surgeon: Arta Silence, MD;  Location: [facility];  Service: Endoscopy;  Laterality: N/A;   FINE NEEDLE ASPIRATION  11/08/2012   Procedure: FINE NEEDLE ASPIRATION (FNA) LINEAR;  Surgeon: Arta Silence, MD;  Location: [facility];  Service: Endoscopy;  Laterality: N/A;   HERNIA REPAIR     RIGHT   LAPAROSCOPY  12/12/2012   Procedure: LAPAROSCOPY DIAGNOSTIC;  Surgeon: Stark Klein, MD;  Location: [facility];  Service: General;  Laterality: N/A;   Left Foot Surgery     Left Foot Surgery     VASECTOMY     WHIPPLE PROCEDURE  12/12/2012   Procedure: WHIPPLE PROCEDURE;  Surgeon: Stark Klein, MD;  Location: [facility];  Service: General;  Laterality: N/A;    Social History:  Social History   Socioeconomic History    Marital status: Married    Spouse name: Not on file   Number of children: Not on file   Years of education: Not on file   Highest education level: Not on file  Occupational History   Not on file  Social Needs   Financial resource strain: Not on file   Food insecurity    Worry: Not on file    Inability: Not on file   Transportation needs    Medical: Not on file    Non-medical: Not on file  Tobacco Use   Smoking status: Never Smoker   Smokeless tobacco: Never Used  Substance and Sexual Activity   Alcohol use: No   Drug use: No   Sexual activity: Never  Lifestyle   Physical activity    Days per week: Not on file    Minutes per session: Not on file   Stress: Not on file  Relationships   Social connections    Talks on phone: Not on file    Gets together: Not on file    Attends religious service: Not on file    Active member of club or organization: Not on file    Attends meetings of clubs or organizations: Not on file    Relationship status: Not on file   Intimate partner violence    Fear of current or ex partner: Not on file    Emotionally abused: Not on file    Physically abused: Not on file    Forced sexual activity: Not on file  Other Topics Concern   Not on file  Social History Narrative   Lives with wife, daughter Hector Pugh    Family History:  Family History  Problem Relation Age of Onset   Cancer Mother        esophageal carcinoma    Medications:   Current Outpatient Medications on File Prior to Visit  Medication Sig Dispense Refill   acetaminophen (TYLENOL) 500 MG tablet Take 500 mg by mouth every 4 (four) hours as needed for moderate pain or headache.      baclofen (LIORESAL) 10 MG tablet Take 1 tablet (10 mg total) by mouth daily as needed for muscle spasms. 30 each 3   glipiZIDE (GLUCOTROL XL) 5 MG 24 hr tablet Take 1 tablet (5 mg total) by mouth daily with breakfast. 30 tablet 0   levothyroxine (SYNTHROID, LEVOTHROID) 50 MCG tablet  Take 1 tablet (50 mcg total) by mouth daily before breakfast. 30 tablet 0   losartan (COZAAR) 50 MG tablet Take 1 tablet (50 mg total) by mouth 2 (two) times daily. 60 tablet 0   metoprolol tartrate (LOPRESSOR) 100 MG tablet Take  1 tablet (100 mg total) by mouth 2 (two) times daily. 60 tablet 0   protein supplement shake (PREMIER PROTEIN) LIQD Take 325 mLs (11 oz total) by mouth 3 (three) times daily with meals.  0   rosuvastatin (CRESTOR) 10 MG tablet Take 1 tablet by mouth daily.     senna (SENOKOT) 8.6 MG TABS tablet Take 1 tablet (8.6 mg total) by mouth daily. 30 each 0   tamsulosin (FLOMAX) 0.4 MG CAPS capsule Take 1 capsule (0.4 mg total) by mouth daily. 30 capsule 0   No current facility-administered medications on file prior to visit.     Allergies:  No Known Allergies   Physical Exam  General: well developed, well nourished, pleasant middle-age male, seated, in no evident distress Head: head normocephalic and atraumatic.    Neurologic Exam Mental Status: Awake and fully alert.  Occasional word finding difficulty but compensates appropriately.  Oriented to place and time. Recent and remote memory intact. Attention span, concentration and fund of knowledge appropriate. Mood and affect appropriate.  Cranial Nerves: Extraocular movements full without nystagmus. Hearing intact to voice. Facial sensation intact. Face, tongue, palate moves normally and symmetrically.  Shoulder shrug symmetric. Motor: No evidence of weakness per drift assessment Sensory.: intact to light touch Coordination: Rapid alternating movements normal in all extremities. Finger-to-nose and heel-to-shin performed accurately bilaterally. Gait and Station: Arises from chair without difficulty. Stance is normal. Gait demonstrates normal stride length and balance .  Reflexes: UTA    Diagnostic Data (Labs, Imaging, Testing)  Ct Head Code Stroke Wo Contrast 04/05/2018 0944 1. Large hemorrhage left temporal lobe  with surrounding edema and mass-effect. Intraventricular hemorrhage with mild hydrocephalus. 13 mm midline shift. Etiologies of hemorrhage are not determined on this study but could be due to hypertension, cerebral amyloid, vascular malformation, or less likely tumor. 2. ASPECTS is 10   Ct Angio Head W Or Wo Contrast 04/05/2018  1112 Stable LEFT temporal lobe hemorrhage, with LEFT-to-RIGHT midline shift. Lobar hypertensive bleed is favored. No underlying vascular anomaly, or arterial/venous thrombosis/embolus.   Ct Head Wo Contrast 04/05/2018 2228 1. Postsurgical changes from interval partial evacuation of hematoma within the left temporal lobe with approximately 20 cc residual. 2. Stable small volume of intraventricular hemorrhage and small volume of subarachnoid hemorrhage predominantly over left convexity. 3. Decreased mass effect with 6 mm left-to-right midline shift, previously 13 mm. Improved patency of lateral ventricles. 4. No new acute intracranial abnormality identified.   Ct Head Wo Contrast 04/06/2018 0913 1. Similar postoperative appearance of residual blood products in the left temporal lobe following partial evacuation of the hematoma. 2. Stable intraventricular hemorrhage without hydrocephalus. 3. 6 mm left-to-right midline shift is stable.   Ct Head Wo Contrast 04/07/2018 1552 1. Stable to slight decrease in size of residual blood products in the left temporal lobe following partial vacuo a shin. 2. Slight decrease in midline shift. 3. Stable interventricular hemorrhage without hydrocephalus.   Ct Head Wo Contrast 04/09/2018 0438 1. Stable left temporal lobe hemorrhage, 5 mm left-to-right midline shift, small volume subarachnoid hemorrhage, and small volume intraventricular hemorrhage. 2. No new acute intracranial abnormality.  Ct Head Wo Contrast 04/11/2018 0540 1. Evolving left temporal lobe hemorrhage, similar in size but decreased in attenuation as compared to 04/09/2018, with  increased localized vasogenic edema and regional mass effect. Secondary left-to-right shift of 7 mm. 2. Persistent small volume intraventricular hemorrhage, slightly decreased from previous. 3. No other new acute intracranial abnormality.    TTE  04/07/2018 -  Left ventricle: The cavity size was normal. Wall thickness wasnormal. Systolic function was normal. The estimated ejectionfraction was in the range of 60% to 65%. Wall motion was normal;there were no regional wall motion abnormalities. Leftventricular diastolic function parameters were normal. - Aortic valve: There was mild regurgitation. - Atrial septum: No defect or patent foramen ovale was identified. - Systemic veins: Patient ventilated so unable to accurately assessCVP with IVC assessment.  CT head wo contrast 04/13/2018 IMPRESSION: 1. Stable left anterior temporal lobe hematoma, small volume intraventricular hemorrhage, and small volume subarachnoid hemorrhage. No new hemorrhage. 2. Stable surrounding edema in the left temporal lobe, associated mass effect, 7 mm left-to-right midline shift, and left uncal herniation. 3. Stable small left lateral craniotomy postsurgical changes.    ASSESSMENT: Hector Pugh is a 58 y.o. 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 Vascular risk factors include HLD, HTN, DM and potential AF.  Evidence of DVT therefore Eliquis initiated during CIR stay.  Residual deficits of visual loss, intermittent word finding difficulty and intermittent left-sided sensation described above.  Overall improvement since prior visit.   PLAN: -Eliquis discontinued by cardiology which she was previously on for DVT.  No indication at this time for initiating aspirin 81 mg daily with no prior history of stroke.  Recommend following up with PCP and cardiology ongoing. -Patient concerned regarding left-sided intermittent symptoms with increased anxiety during episodes.  Discussion regarding likely residual  deficit from a stroke and to continue to monitor at this time.  Advised him to call office with any worsening or increased frequency of symptoms -Continue Crestor 10 mg daily for secondary stroke prevention -Continue f/u with cardiologist regularly -Continue to follow with Dr. Sima Matas for ongoing post stroke cognitive deficits -Continue to follow with ophthalmology as scheduled -F/u with PCP regarding your HLD, HTN and DM management -continue to monitor BP at home -Maintain strict control of hypertension with blood pressure goal below 130/90, diabetes with hemoglobin A1c goal below 6.5% and cholesterol with LDL cholesterol (bad cholesterol) goal below 70 mg/dL. I also advised the patient to eat a healthy diet with plenty of whole grains, cereals, fruits and vegetables, exercise regularly and maintain ideal body weight.  Follow up as needed as overall stable from stroke standpoint   Greater than 50% of time during this 25 minute non-face-to-face visit was spent on counseling,explanation of diagnosis of ICH, reviewing risk factor management of HLD, HTN, DM and potential A. fib, planning of further management, discussion with patient and family and coordination of care  Frann Rider, [facility]  [facility] [address] [place], [place] [address]  Phone [phone] Fax [phone]

## 2019-07-29 NOTE — Progress Notes (Signed)
I agree with the above plan 

## 2019-07-30 ENCOUNTER — Ambulatory Visit: Payer: Managed Care, Other (non HMO) | Admitting: Physical Medicine & Rehabilitation

## 2019-08-05 ENCOUNTER — Encounter: Payer: Self-pay | Admitting: Physical Medicine & Rehabilitation

## 2019-08-05 ENCOUNTER — Other Ambulatory Visit: Payer: Self-pay

## 2019-08-05 ENCOUNTER — Encounter
Payer: Managed Care, Other (non HMO) | Attending: Physical Medicine & Rehabilitation | Admitting: Physical Medicine & Rehabilitation

## 2019-08-05 VITALS — BP 126/73 | HR 60 | Temp 98.0°F | Ht 65.0 in | Wt 150.0 lb

## 2019-08-05 DIAGNOSIS — R4701 Aphasia: Secondary | ICD-10-CM | POA: Insufficient documentation

## 2019-08-05 DIAGNOSIS — M545 Low back pain: Secondary | ICD-10-CM | POA: Diagnosis present

## 2019-08-05 DIAGNOSIS — I6990 Unspecified sequelae of unspecified cerebrovascular disease: Secondary | ICD-10-CM | POA: Insufficient documentation

## 2019-08-05 DIAGNOSIS — R208 Other disturbances of skin sensation: Secondary | ICD-10-CM | POA: Diagnosis present

## 2019-08-05 DIAGNOSIS — G8929 Other chronic pain: Secondary | ICD-10-CM | POA: Diagnosis present

## 2019-08-05 NOTE — Progress Notes (Signed)
Subjective:    Patient ID: Hector Pugh, male    DOB: 1961/04/25, 58 y.o.   MRN: [id number]  HPI  Male with history of pancreatic cancer, T2DM, HTN presents for follow up for left temporal lobe hemorrhage.   Last clinic visit 04/02/2019.  Since that time, he is doing HEP.  He saw Neurology recently. Cognitive deficits continue to proving.  He feels continues to have speech deficits, but states he has not been practicing at home. He drives with family. He continues to follow up with Neuropsych. He notes help with Robaxin, but does not feel he needs it. He notes transient changes in sensation after working in hot sun, but is not wearing protective equipment and with limited water intake. Denies falls.   Pain Inventory Average Pain 0 Pain Right Now 0 My pain is no pain  In the last 24 hours, has pain interfered with the following? General activity 0 Relation with others 0 Enjoyment of life 0 What TIME of day is your pain at its worst? no pain Sleep (in general) Good  Pain is worse with: no pain Pain improves with: no pain Relief from Meds: no pain  Mobility walk without assistance  Function disabled: date disabled . I need assistance with the following:  meal prep, household duties and shopping  Neuro/Psych No problems in this area  Prior Studies Any changes since last visit?  no  Physicians involved in your care Any changes since last visit?  no   Family History  Problem Relation Age of Onset  . Cancer Mother        esophageal carcinoma   Social History   Socioeconomic History  . Marital status: Married    Spouse name: Not on file  . Number of children: Not on file  . Years of education: Not on file  . Highest education level: Not on file  Occupational History  . Not on file  Social Needs  . Financial resource strain: Not on file  . Food insecurity    Worry: Not on file    Inability: Not on file  . Transportation needs    Medical: Not on file   Non-medical: Not on file  Tobacco Use  . Smoking status: Never Smoker  . Smokeless tobacco: Never Used  Substance and Sexual Activity  . Alcohol use: No  . Drug use: No  . Sexual activity: Never  Lifestyle  . Physical activity    Days per week: Not on file    Minutes per session: Not on file  . Stress: Not on file  Relationships  . Social Herbalist on phone: Not on file    Gets together: Not on file    Attends religious service: Not on file    Active member of club or organization: Not on file    Attends meetings of clubs or organizations: Not on file    Relationship status: Not on file  Other Topics Concern  . Not on file  Social History Narrative   Lives with wife, daughter [name]   Past Surgical History:  Procedure Laterality Date  . circucision    . circumsision    . COLONOSCOPY N/A 04/04/2018   Procedure: COLONOSCOPY;  Surgeon: Leighton Ruff, MD;  Location: [facility];  Service: Endoscopy;  Laterality: N/A;  . CRANIOTOMY Left 04/05/2018   Procedure: Left Temporal CRANIOTOMY HEMATOMA EVACUATION of Intracranial Hemorrhage;  Surgeon: Ashok Pall, MD;  Location: [facility];  Service: Neurosurgery;  Laterality: Left;  .  ERCP  11/08/2012   Procedure: ENDOSCOPIC RETROGRADE CHOLANGIOPANCREATOGRAPHY (ERCP);  Surgeon: Jeryl Columbia, MD;  Location: [facility];  Service: Gastroenterology;  Laterality: N/A;  . EUS  11/08/2012   Procedure: UPPER ENDOSCOPIC ULTRASOUND (EUS) LINEAR;  Surgeon: Arta Silence, MD;  [facility]: [facility];  Service: Endoscopy;  Laterality: N/A;  . FINE NEEDLE ASPIRATION  11/08/2012   Procedure: FINE NEEDLE ASPIRATION (FNA) LINEAR;  Surgeon: Arta Silence, MD;  [facility];  Service: Endoscopy;  Laterality: N/A;  . HERNIA REPAIR     RIGHT  . LAPAROSCOPY  12/12/2012   Procedure: LAPAROSCOPY DIAGNOSTIC;  Surgeon: Stark Klein, MD;  Location: [facility];  Service: General;  Laterality: N/A;  . Left Foot Surgery    . Left Foot Surgery    .  VASECTOMY    . WHIPPLE PROCEDURE  12/12/2012   Procedure: WHIPPLE PROCEDURE;  Surgeon: Stark Klein, MD;  Location: [facility];  Service: General;  Laterality: N/A;   Past Medical History:  Diagnosis Date  . 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 Cancer ([place])   . Diabetes mellitus ([place]) 11/10/2012  . Diabetes mellitus without complication (HCC)    diet controlled  . HTN (hypertension) 11/10/2012  . Hypertension   . 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 Pancreatic cancer ([place])   . Pancreatitis 11/10/2012  . Seasonal allergies    BP 126/73   Pulse 60   Temp 98 F (36.7 C)   Ht 5\' 5"  (1.651 m)   Wt 150 lb (68 kg)   SpO2 98%   BMI 24.96 kg/m   Opioid Risk Score:   Fall Risk Score:  `1  Depression screen PHQ 2/9  Depression screen [facility] 2/9 04/02/2019 05/09/2018  Decreased Interest 0 0  Down, Depressed, Hopeless 0 1  PHQ - 2 Score 0 1  Altered sleeping - 0  Tired, decreased energy - 1  Change in appetite - 0  Feeling bad or failure about yourself  - 0  Trouble concentrating - 1  Moving slowly or fidgety/restless - 0  Suicidal thoughts - 0  PHQ-9 Score - 3  Difficult doing work/chores - Not difficult at all   Review of Systems  Constitutional: Negative.   HENT: Negative.   Eyes: Negative.   Respiratory: Negative.   Cardiovascular: Negative.   Gastrointestinal: Negative.   Endocrine:       High/low blood sugar   Genitourinary: Negative.   Musculoskeletal: Negative.   Skin: Negative.   Allergic/Immunologic: Negative.   Neurological: Negative.   Hematological: Negative.   Psychiatric/Behavioral: Negative.   All other systems reviewed and are negative.     Objective:   Physical Exam Constitutional: NAD.  Vital signs reviewed. HENT: Normocephalic.  Atraumatic. Eyes: EOMI. No discharge. Cardiovascular:  No JVD. Respiratory: Normal effort. GI: Non--distended. Musc: No edema and no tenderness. Neurological: He is alert.  Aphasia  significantly improved Right inattention, improving per patient Motor: Grossly 5/5 throughout Skin: Warm and dry. Intact Psychiatric: Normal mood and behavior.    Assessment & Plan:  Male with history of pancreatic cancer, T2DM, HTN presents for follow up for left temporal lobe hemorrhage.   1. 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  2. Chronic low back pain:   Relatively controlled at present  Robaxin 500 PRN  Encouraged stretching/core strengthening exercise  Exacerbated by excessive activity  May consider Baclofen 10 daily PRN if necessary, ordered  3. Coping  Cont to follow up with Neuropsych  4. Transient dysesthesias  Encouraged use of protective equipment in hot sunny days- glasses, cap, water intake, etc

## 2019-11-17 IMAGING — DX DG CHEST 1V PORT
1 series · 1 of 1 positions shown · non-contrast
Comparison: Chest radiograph from one day prior.

CLINICAL DATA: Acute respiratory failure with hypoxia

EXAM:
PORTABLE CHEST 1 VIEW

[chest]
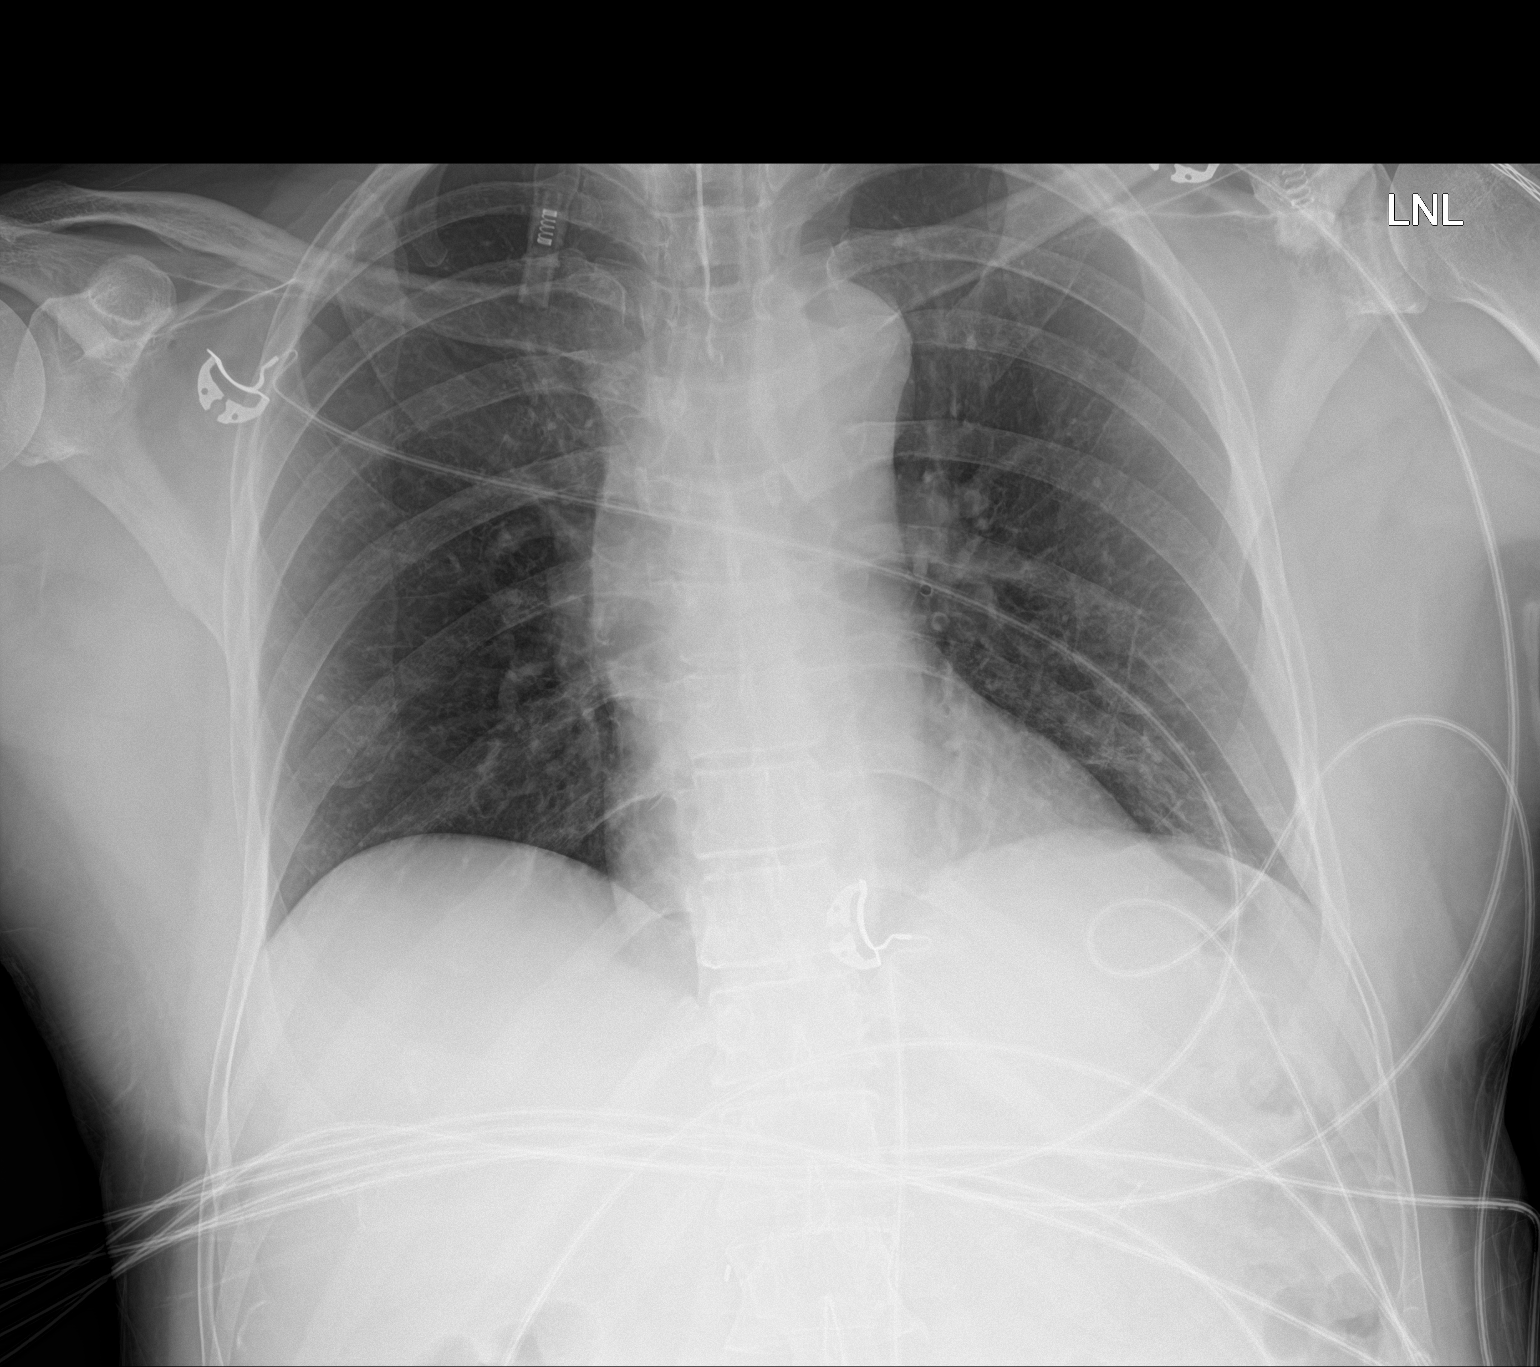

[1 of 1 positions shown; findings below may reference images not displayed]

FINDINGS: Endotracheal tube tip is 3.7 cm above the carina. Stable
cardiomediastinal silhouette with normal heart size. No
pneumothorax. No pleural effusion. Lungs appear clear, with no acute
consolidative airspace disease and no pulmonary edema.
IMPRESSION: Well-positioned endotracheal tube. No active cardiopulmonary
disease.

## 2019-11-17 IMAGING — CT CT HEAD W/O CM
4 series · 16 of 47 positions shown, 18 images · non-contrast
Comparison: Pre and postoperative head CTs 04/05/2018.

CLINICAL DATA: Craniotomy for evacuation of left temporal lobe
hematoma 04/05/2018

EXAM:
CT HEAD WITHOUT CONTRAST
TECHNIQUE: Contiguous axial images were obtained from the base of the skull
through the vertex without intravenous contrast.

[Series 3: head wo · axial · 0.42mm/px · z∈[-234,-114]mm · 7 of 33 slices shown, 9 images]
[im 5/33  brain]
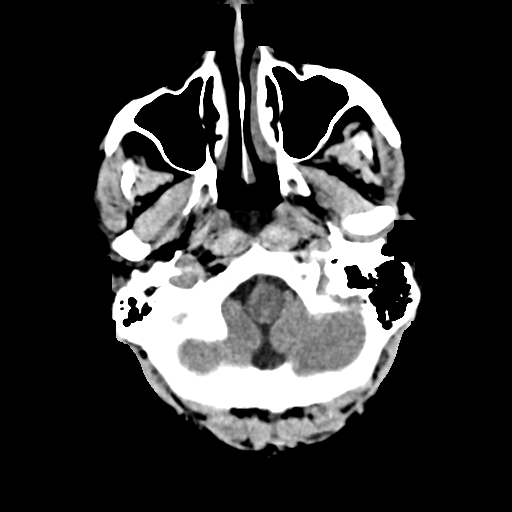
[im 5/33  bone]
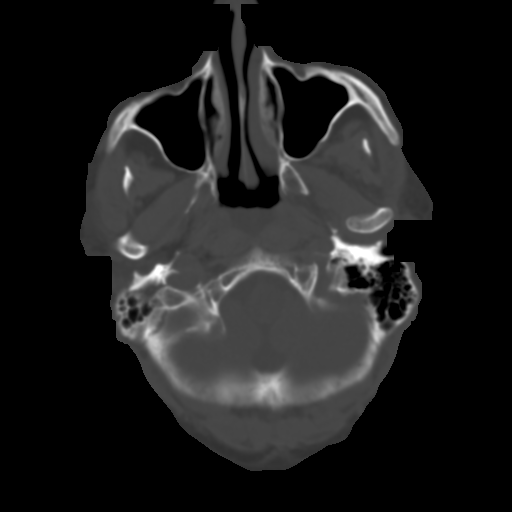
[im 9/33  brain]
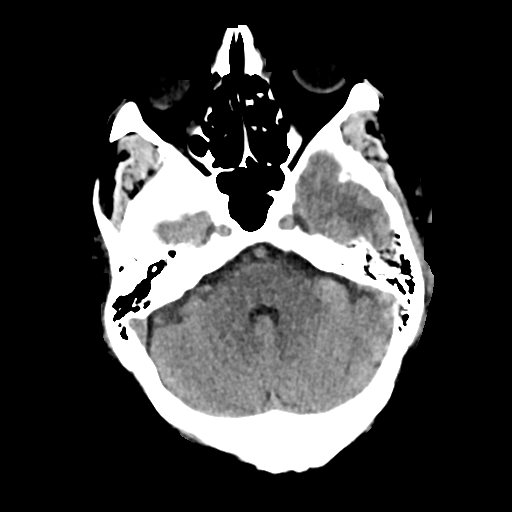
[im 13/33  brain]
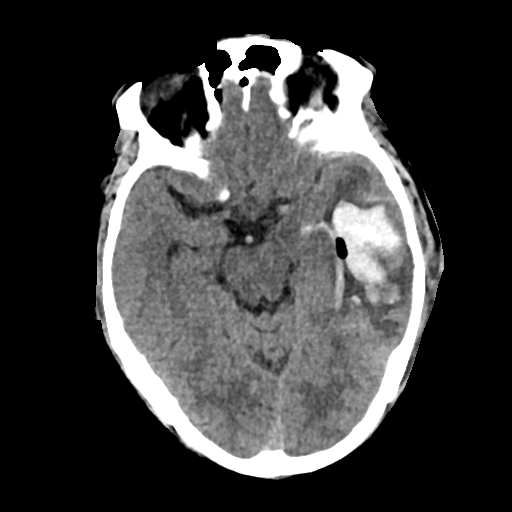
[im 17/33  brain]
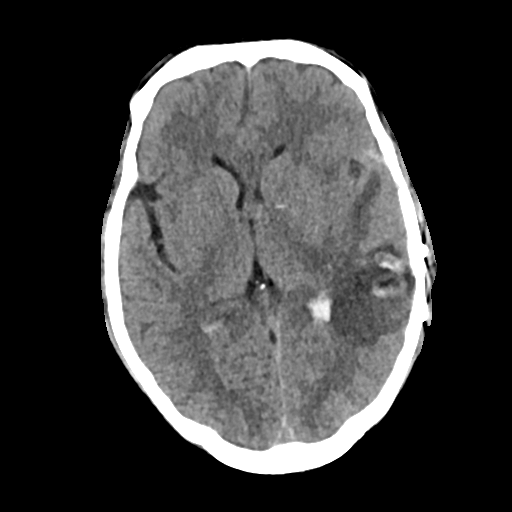
[im 21/33  brain]
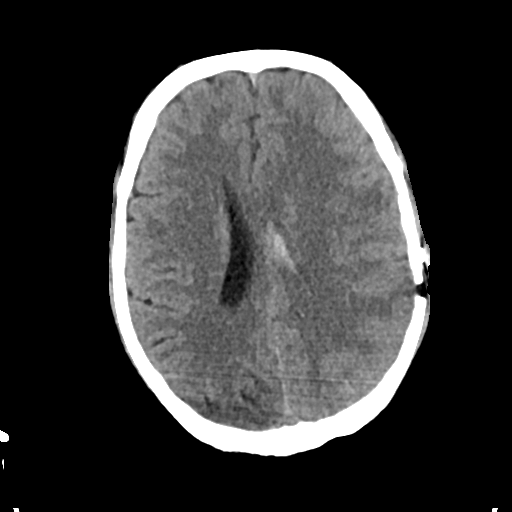
[im 21/33  bone]
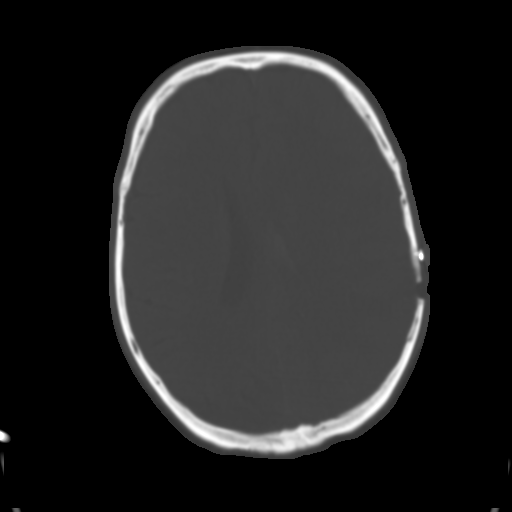
[im 25/33  brain]
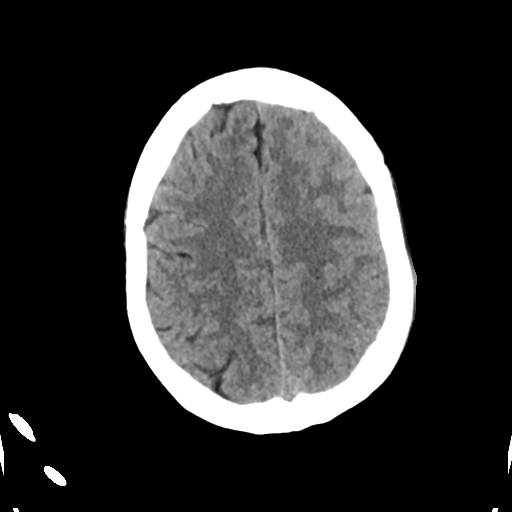
[im 29/33  brain]
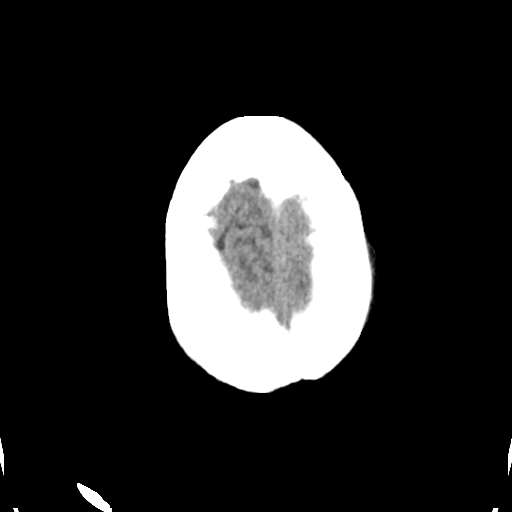

[Series 4: head bone · axial · 0.42mm/px · z∈[-238,-206]mm · 3 of 81 slices shown]
[im 9/81  bone]
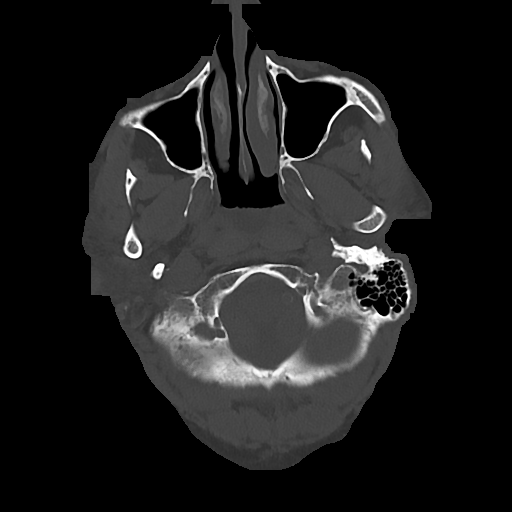
[im 17/81  bone]
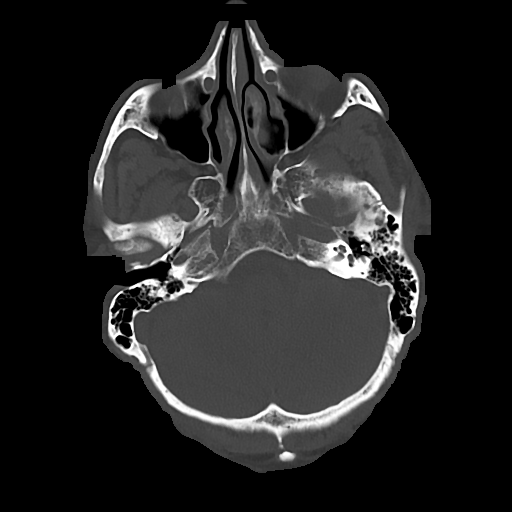
[im 25/81  bone]
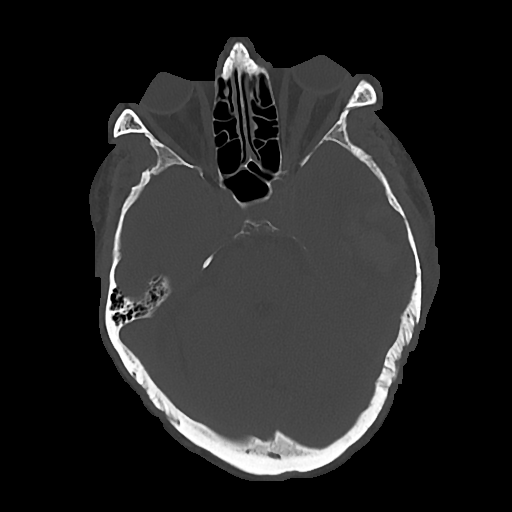

[Series 5: cor soft · coronal · 0.32mm/px · 3 of 72 slices shown]
[im 24/72  brain]
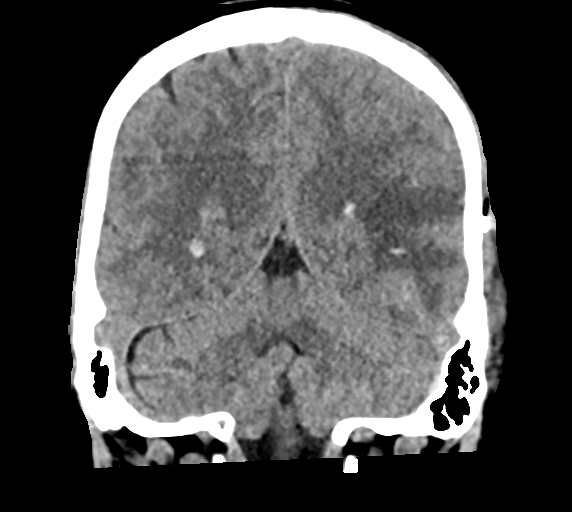
[im 32/72  brain]
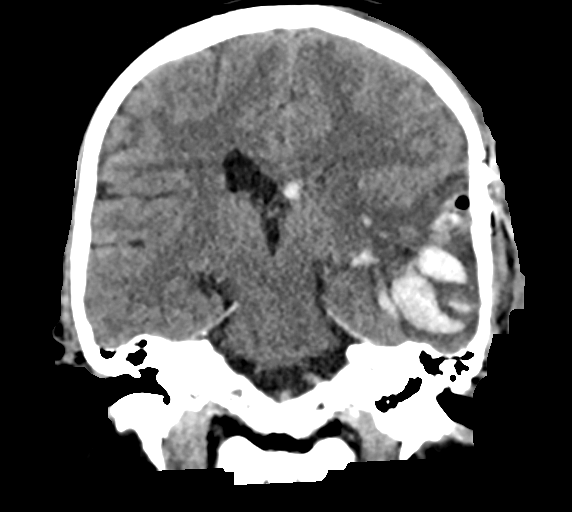
[im 40/72  brain]
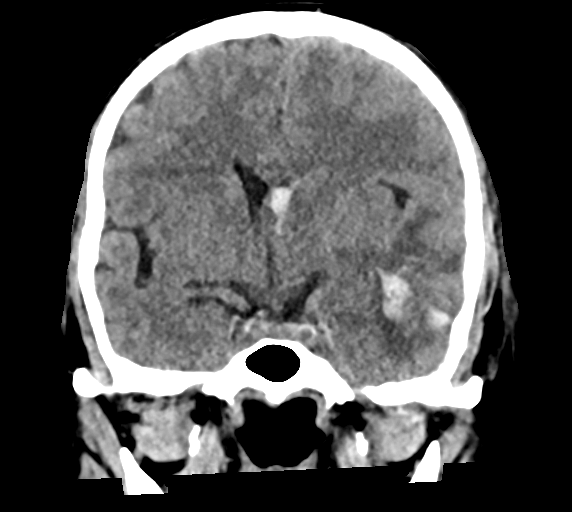

[Series 6: sag soft · sagittal · 0.32mm/px · 3 of 61 slices shown]
[im 21/61  brain]
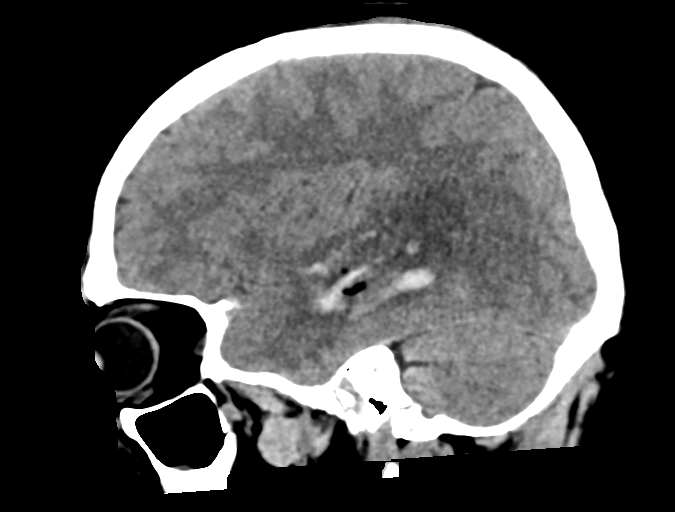
[im 31/61  brain]
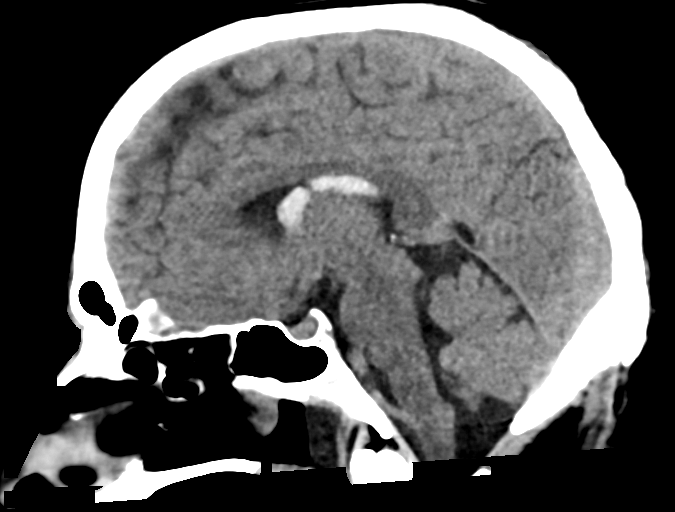
[im 41/61  brain]
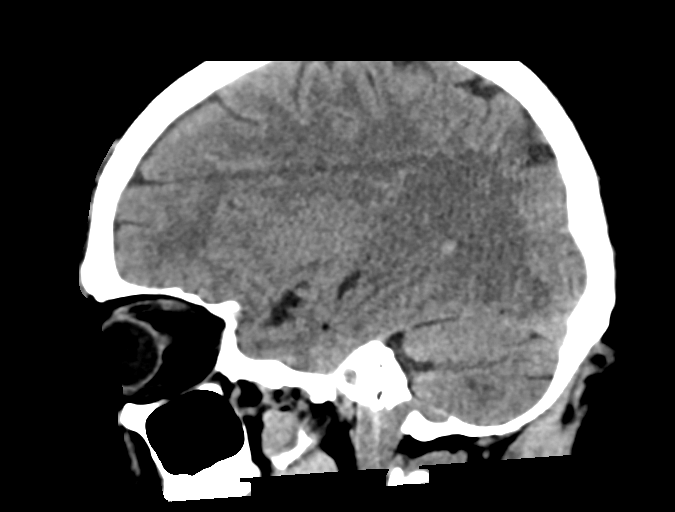

[16 of 47 positions shown; findings below may reference images not displayed]

FINDINGS: Brain: Residual blood products in the left temporal lobe are similar
to the postoperative exam. Intraventricular hemorrhage is again
noted, left greater than right. There is no hydrocephalus. Midline
shift just above the foramen of July measures 6 mm, unchanged. No
new hemorrhage is present. Edema surrounding the left temporal
hemorrhage is stable. Minimal extra-axial blood products are stable.
Brainstem and cerebellum are within normal limits.

Vascular: Atherosclerotic calcifications are present at the dural
margin of both vertebral arteries and within the cavernous internal
carotid arteries without a hyperdense vessel.

Skull: Left temporoparietal craniotomy is again noted. Calvarium is
otherwise intact. No focal lytic or blastic lesions are present.

Sinuses/Orbits: The paranasal sinuses and mastoid air cells are
clear. Globes and orbits are within normal limits.
IMPRESSION: 1. Similar postoperative appearance of residual blood products in
the left temporal lobe following partial evacuation of the hematoma.
2. Stable intraventricular hemorrhage without hydrocephalus.
3. 6 mm left-to-right midline shift is stable.

## 2019-11-22 IMAGING — DX DG CHEST 1V PORT
1 series · 1 of 1 positions shown · non-contrast
Comparison: Radiograph April 09, 2018.

CLINICAL DATA: Fever.

EXAM:
PORTABLE CHEST 1 VIEW

[chest]
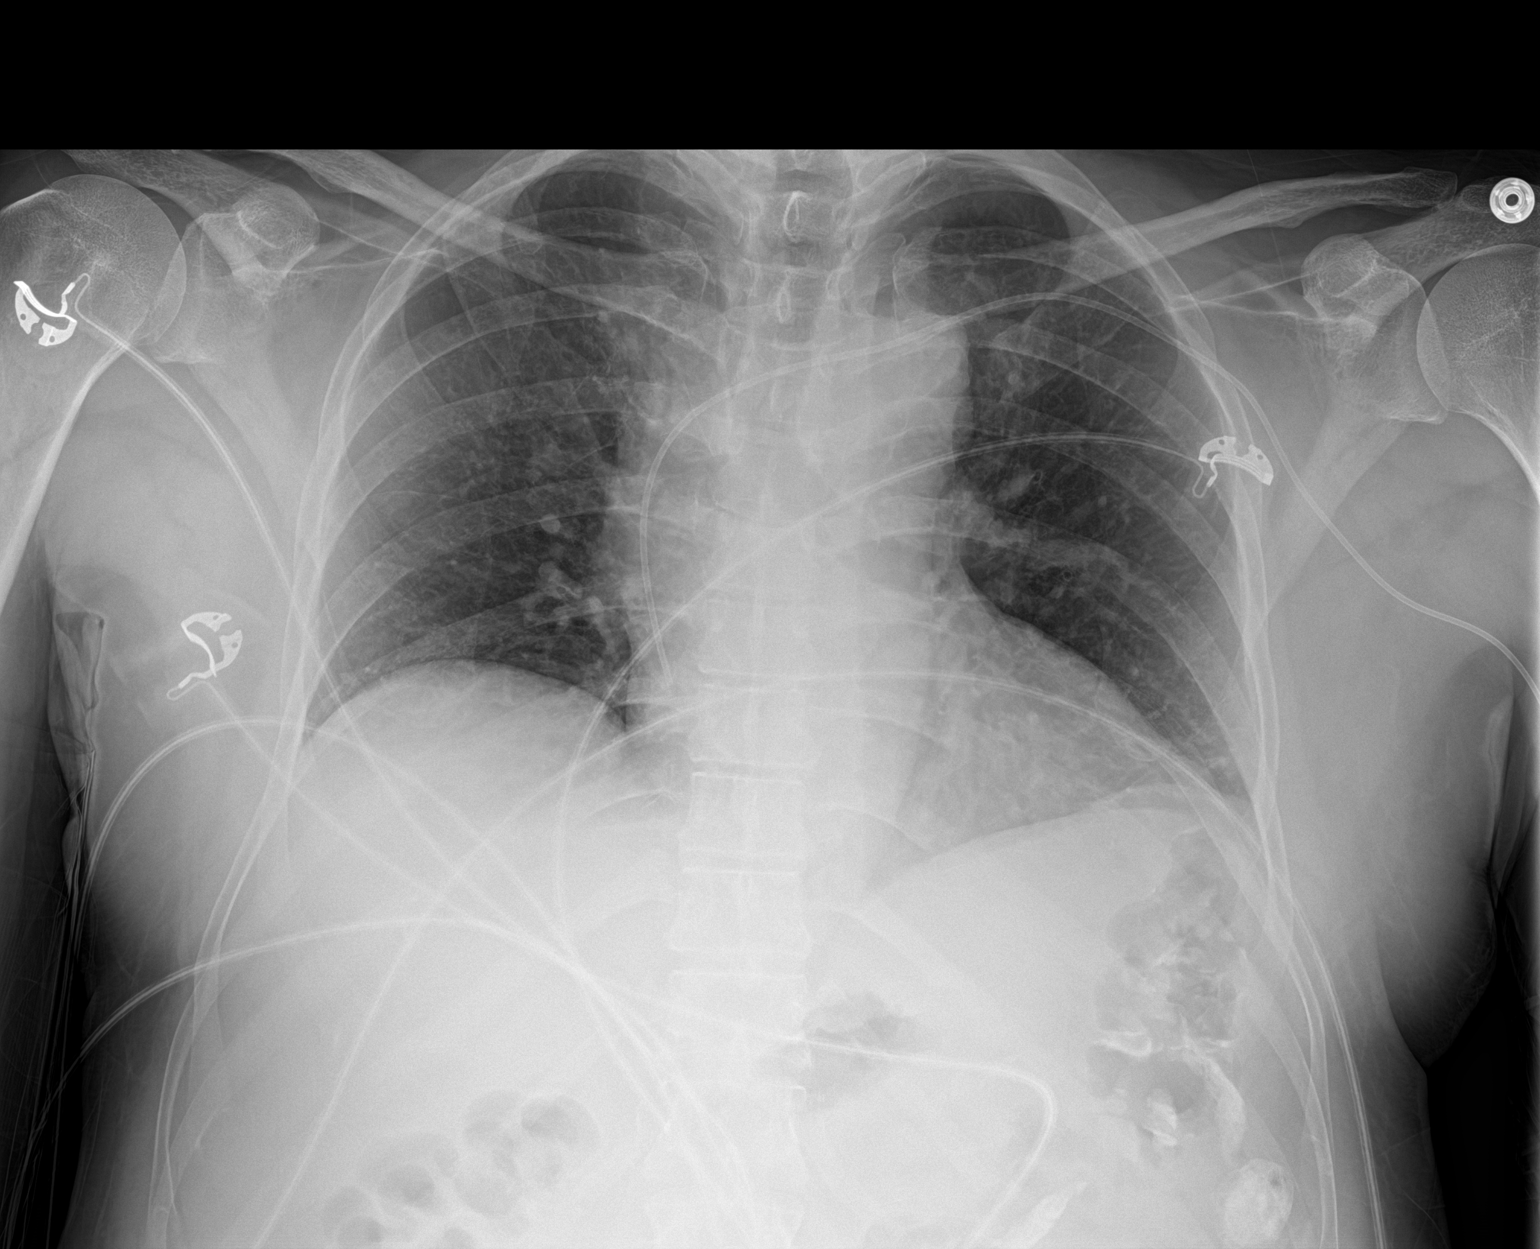

[1 of 1 positions shown; findings below may reference images not displayed]

FINDINGS: Stable cardiomediastinal silhouette. Left-sided PICC line is
unchanged in position. No pneumothorax or pleural effusion is noted.
No acute pulmonary disease is noted. Bony thorax is unremarkable.
IMPRESSION: No acute cardiopulmonary abnormality seen.

## 2019-11-27 IMAGING — DX DG CHEST 2V
2 series · 2 of 2 positions shown · non-contrast
Comparison: 04/11/2018

CLINICAL DATA: Fever

EXAM:
CHEST - 2 VIEW

[x chest ap]
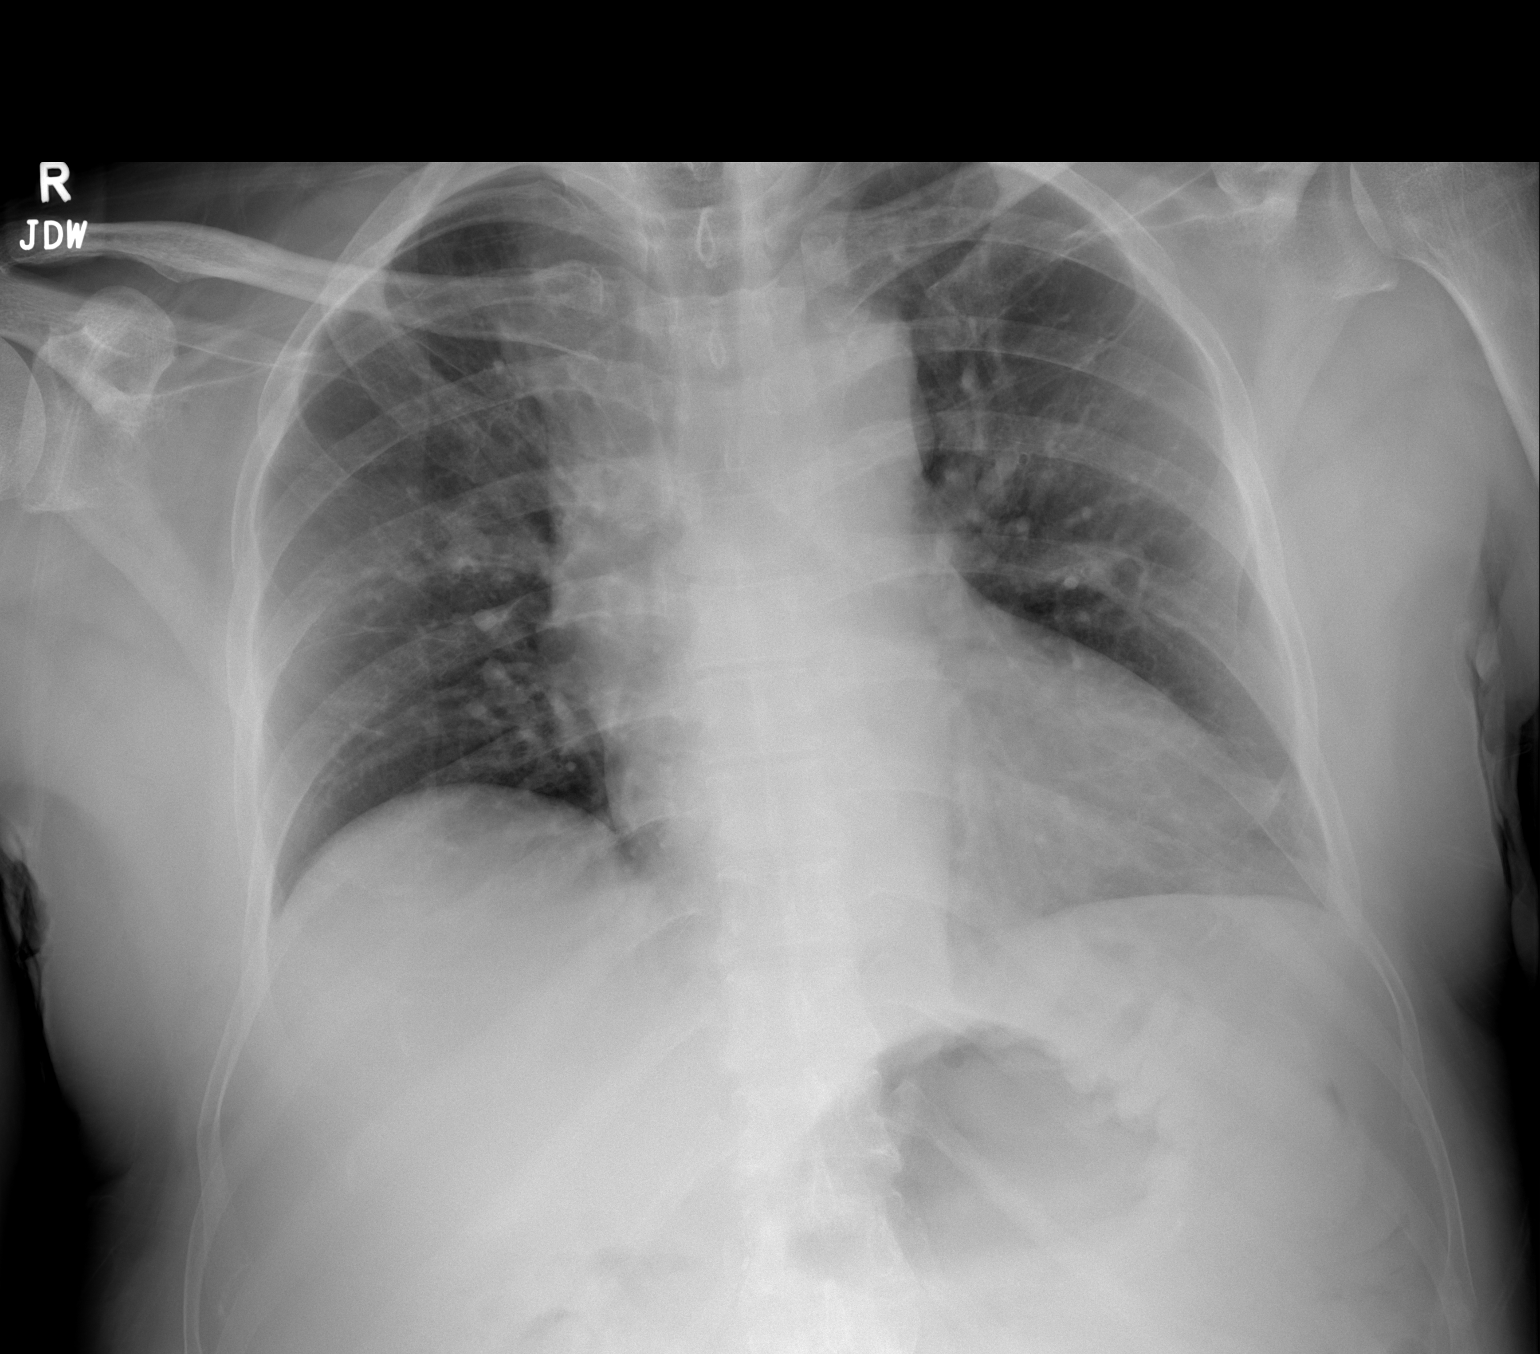

[w chest lat]
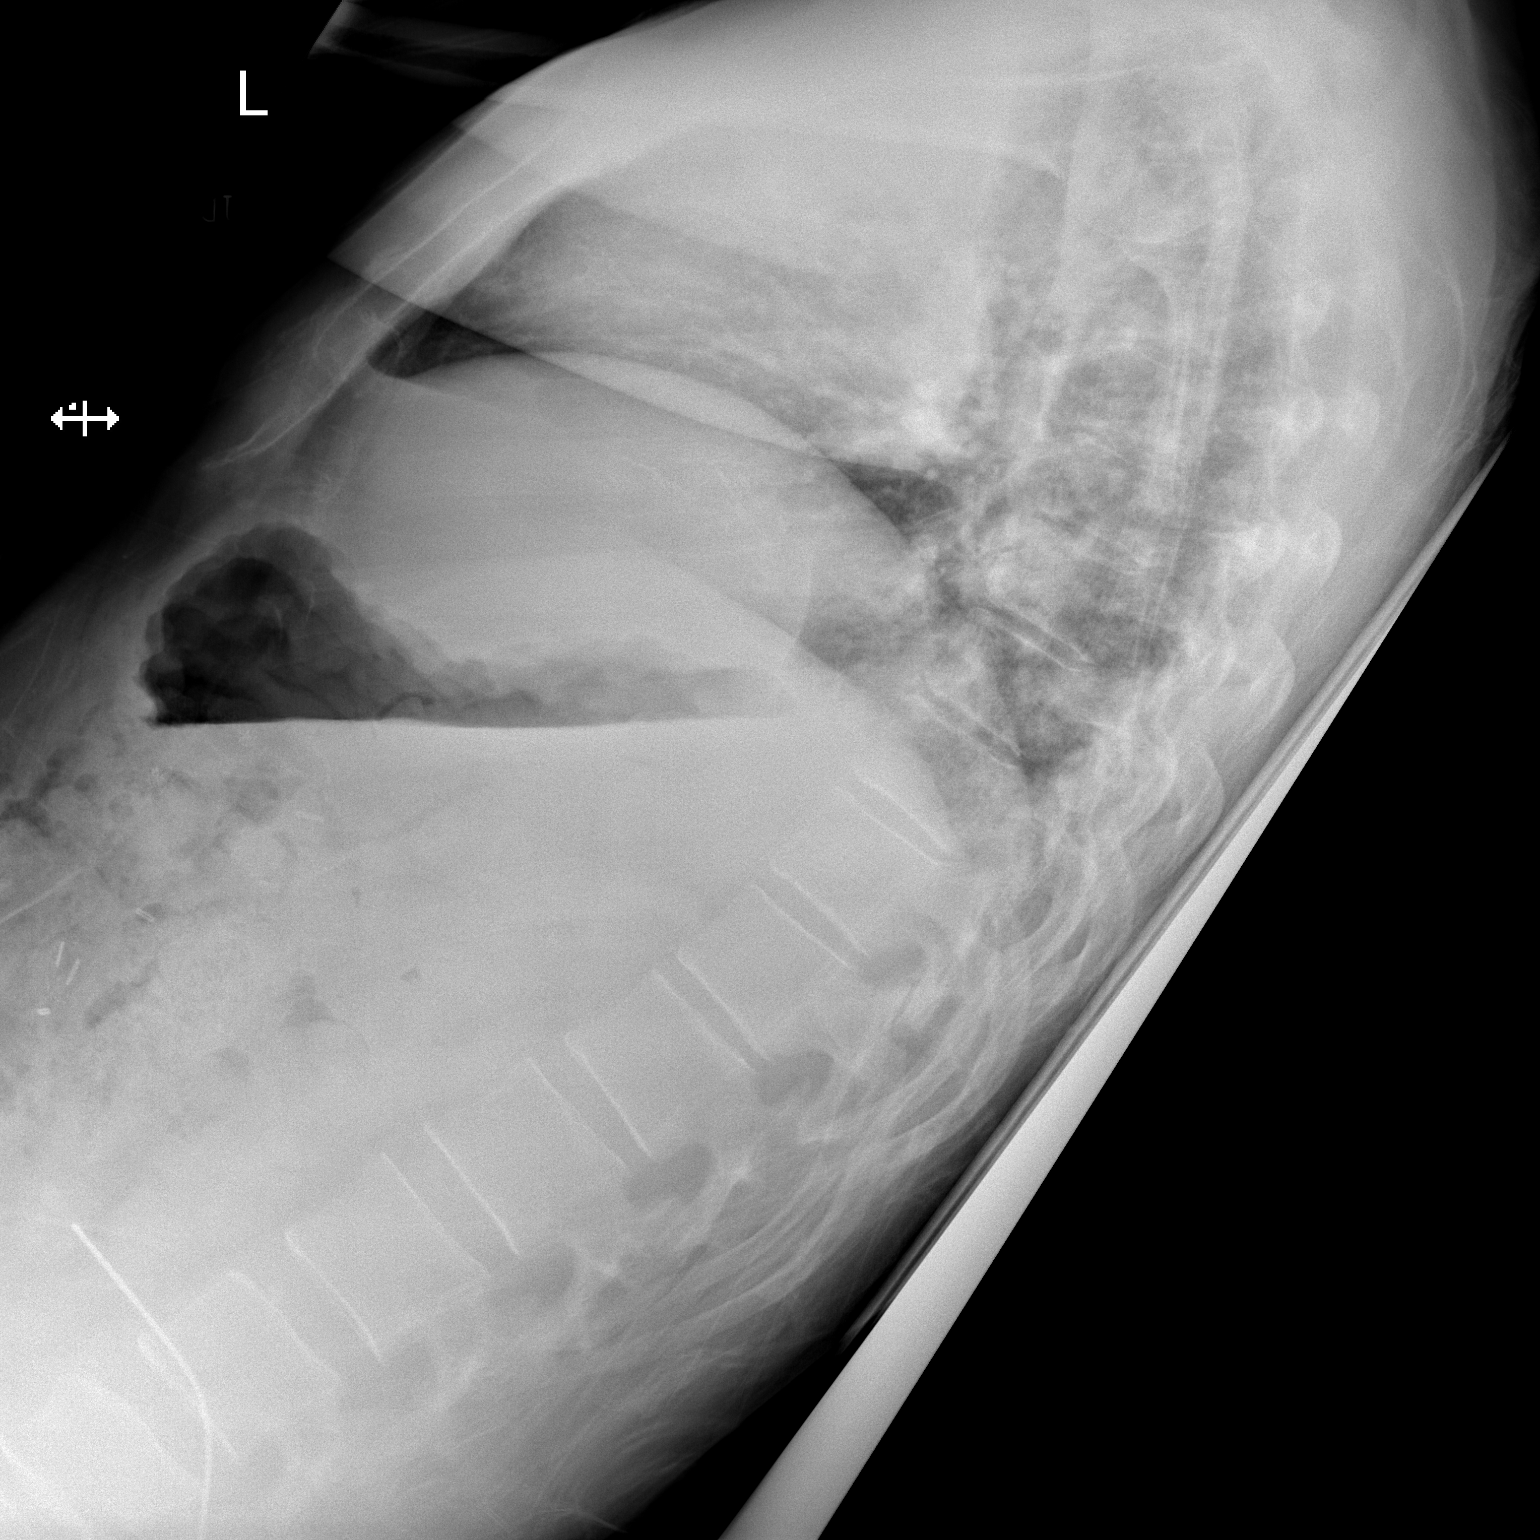

[2 of 2 positions shown; findings below may reference images not displayed]

FINDINGS: Left arm PICC has been removed.

Hypoventilation with slight bibasilar atelectasis. Negative for
heart failure or pneumonia or effusion.
IMPRESSION: Hypoventilation with mild bibasilar atelectasis.

## 2019-12-30 ENCOUNTER — Encounter: Payer: Managed Care, Other (non HMO) | Admitting: Psychology

## 2020-02-02 ENCOUNTER — Ambulatory Visit: Payer: Managed Care, Other (non HMO) | Admitting: Physical Medicine & Rehabilitation

## 2020-02-03 ENCOUNTER — Encounter
Payer: Managed Care, Other (non HMO) | Attending: Physical Medicine & Rehabilitation | Admitting: Physical Medicine & Rehabilitation

## 2020-02-03 ENCOUNTER — Other Ambulatory Visit: Payer: Self-pay

## 2020-02-03 ENCOUNTER — Encounter: Payer: Self-pay | Admitting: Physical Medicine & Rehabilitation

## 2020-02-03 VITALS — BP 145/74 | HR 49 | Temp 97.9°F | Ht 62.0 in | Wt 158.0 lb

## 2020-02-03 DIAGNOSIS — M545 Low back pain, unspecified: Secondary | ICD-10-CM

## 2020-02-03 DIAGNOSIS — G8929 Other chronic pain: Secondary | ICD-10-CM

## 2020-02-03 DIAGNOSIS — R208 Other disturbances of skin sensation: Secondary | ICD-10-CM

## 2020-02-03 DIAGNOSIS — I611 Nontraumatic intracerebral hemorrhage in hemisphere, cortical: Secondary | ICD-10-CM

## 2020-02-03 DIAGNOSIS — I6990 Unspecified sequelae of unspecified cerebrovascular disease: Secondary | ICD-10-CM | POA: Diagnosis present

## 2020-02-03 NOTE — Progress Notes (Signed)
Subjective:    Patient ID: Hector Pugh, male    DOB: 01-14-1961, 59 y.o.   MRN: [id number]  HPI  Male with history of pancreatic cancer, T2DM, HTN presents for follow up for left temporal lobe hemorrhage.   Last clinic visit 08/05/2019.  Since that time, he states he is walking.  He is following up with Neuro. He is driving now with someone. He states he doing SLP therapies at home, but he states it is hard to test because he is at home all day and is not challenged as much.  He states he still has intermittent low back pain. He denies benefit with Baclofen.  He has not followed up with Neuropsych. He has not noticed dysesthesias, but has not been in the sun much either. He notes eye pain. He notes history of sinusitis.  Has states he is following up with optometrist.   Pain Inventory Average Pain 1 Pain Right Now 0 My pain is no pain  In the last 24 hours, has pain interfered with the following? General activity 0 Relation with others 0 Enjoyment of life 0 What TIME of day is your pain at its worst? no pain Sleep (in general) Good  Pain is worse with: no pain Pain improves with: no pain Relief from Meds: no pain  Mobility walk without assistance how many minutes can you walk? 60 do you drive?  yes  Function disabled: date disabled .  Neuro/Psych No problems in this area  Prior Studies Any changes since last visit?  no  Physicians involved in your care Any changes since last visit?  no   Family History  Problem Relation Age of Onset  . Cancer Mother        esophageal carcinoma   Social History   Socioeconomic History  . Marital status: Married    Spouse name: Not on file  . Number of children: Not on file  . Years of education: Not on file  . Highest education level: Not on file  Occupational History  . Not on file  Tobacco Use  . Smoking status: Never Smoker  . Smokeless tobacco: Never Used  Substance and Sexual Activity  . Alcohol use: No  . Drug  use: No  . Sexual activity: Never  Other Topics Concern  . Not on file  Social History Narrative   Lives with wife, daughter Puja   Social Determinants of Health   Financial Resource Strain:   . Difficulty of Paying Living Expenses: Not on file  Food Insecurity:   . Worried About Charity fundraiser in the Last Year: Not on file  . Ran Out of Food in the Last Year: Not on file  Transportation Needs:   . Lack of Transportation (Medical): Not on file  . Lack of Transportation (Non-Medical): Not on file  Physical Activity:   . Days of Exercise per Week: Not on file  . Minutes of Exercise per Session: Not on file  Stress:   . Feeling of Stress : Not on file  Social Connections:   . Frequency of Communication with Friends and Family: Not on file  . Frequency of Social Gatherings with Friends and Family: Not on file  . Attends Religious Services: Not on file  . Active Member of Clubs or Organizations: Not on file  . Attends Archivist Meetings: Not on file  . Marital Status: Not on file   Past Surgical History:  Procedure Laterality Date  . circucision    .  circumsision    . COLONOSCOPY N/A 04/04/2018   Procedure: COLONOSCOPY;  Surgeon: Leighton Ruff, MD;  [facility];  Service: Endoscopy;  Laterality: N/A;  . CRANIOTOMY Left 04/05/2018   Procedure: Left Temporal CRANIOTOMY HEMATOMA EVACUATION of Intracranial Hemorrhage;  Surgeon: Ashok Pall, MD;  Location: [facility];  Service: Neurosurgery;  Laterality: Left;  . ERCP  11/08/2012   Procedure: ENDOSCOPIC RETROGRADE CHOLANGIOPANCREATOGRAPHY (ERCP);  Surgeon: Jeryl Columbia, MD;  Location: [facility];  Service: Gastroenterology;  Laterality: N/A;  . EUS  11/08/2012   Procedure: UPPER ENDOSCOPIC ULTRASOUND (EUS) LINEAR;  Surgeon: Arta Silence, MD;  [facility];  Service: Endoscopy;  Laterality: N/A;  . FINE NEEDLE ASPIRATION  11/08/2012   Procedure: FINE NEEDLE ASPIRATION (FNA) LINEAR;  Surgeon: Arta Silence, MD;  [facility];  Service: Endoscopy;  Laterality: N/A;  . HERNIA REPAIR     RIGHT  . LAPAROSCOPY  12/12/2012   Procedure: LAPAROSCOPY DIAGNOSTIC;  Surgeon: Stark Klein, MD;  [facility];  Service: General;  Laterality: N/A;  . Left Foot Surgery    . Left Foot Surgery    . VASECTOMY    . WHIPPLE PROCEDURE  12/12/2012   Procedure: WHIPPLE PROCEDURE;  Surgeon: Stark Klein, MD;  [facility];  Service: General;  Laterality: N/A;   Past Medical History:  Diagnosis Date  . 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 Cancer ([place])   . Diabetes mellitus ([place]) 11/10/2012  . Diabetes mellitus without complication (HCC)    diet controlled  . HTN (hypertension) 11/10/2012  . Hypertension   . 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 Pancreatic cancer ([place])   . Pancreatitis 11/10/2012  . Seasonal allergies    BP (!) 145/74   Pulse (!) 49   Temp 97.9 F (36.6 C)   Ht 5\' 2"  (1.575 m)   Wt 158 lb (71.7 kg)   SpO2 98%   BMI 28.90 kg/m   Opioid Risk Score:   Fall Risk Score:  `1  Depression screen PHQ 2/9  Depression screen [facility] 2/9 04/02/2019 05/09/2018  Decreased Interest 0 0  Down, Depressed, Hopeless 0 1  PHQ - 2 Score 0 1  Altered sleeping - 0  Tired, decreased energy - 1  Change in appetite - 0  Feeling bad or failure about yourself  - 0  Trouble concentrating - 1  Moving slowly or fidgety/restless - 0  Suicidal thoughts - 0  PHQ-9 Score - 3  Difficult doing work/chores - Not difficult at all   Review of Systems  Constitutional: Negative.   HENT: Negative.   Eyes: Negative.   Respiratory: Negative.   Cardiovascular: Negative.   Gastrointestinal: Negative.   Endocrine:       High/low blood sugar   Genitourinary: Negative.   Musculoskeletal: Negative.   Skin: Negative.   Allergic/Immunologic: Negative.   Neurological: Negative.   Hematological: Negative.   Psychiatric/Behavioral: Negative.       Objective:    Physical Exam Constitutional: NAD.   Musc: No edema and no tenderness. Neurological: He is alert.  Aphasia significantly improved Right inattention, overall improving, patient more aware and able to compensate Motor: Grossly 5/5 throughout    Assessment & Plan:  Male with history of pancreatic cancer, T2DM, HTN presents for follow up for left temporal lobe hemorrhage.   1. 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  2. Chronic low back pain:   Relatively controlled at present  Robaxin 500 PRN  Cont stretching/core strengthening exercise  Exacerbated by excessive activity  No benefit with Baclofen 10 daily PRN if necessary, ordered  Recommend sun salutations  Discussed trigger point injections - patient would like to hold off at present and follow up when pain more severe  3. Coping  Improving  Is not longer follow up with Neuropsych  4. Transient dysesthesias  Encouraged use of protective equipment in hot sunny days- glasses, cap, water intake, etc  States he has not been challenged recently  Pt would like to return in 6 months

## 2020-04-14 ENCOUNTER — Encounter: Payer: Managed Care, Other (non HMO) | Admitting: Psychology

## 2020-08-10 ENCOUNTER — Encounter
Payer: Managed Care, Other (non HMO) | Attending: Physical Medicine & Rehabilitation | Admitting: Physical Medicine & Rehabilitation

## 2020-08-10 ENCOUNTER — Other Ambulatory Visit: Payer: Self-pay

## 2020-08-10 ENCOUNTER — Encounter: Payer: Self-pay | Admitting: Physical Medicine & Rehabilitation

## 2020-08-10 VITALS — BP 151/72 | HR 46 | Temp 97.9°F | Ht 63.0 in | Wt 157.0 lb

## 2020-08-10 DIAGNOSIS — G8929 Other chronic pain: Secondary | ICD-10-CM | POA: Insufficient documentation

## 2020-08-10 DIAGNOSIS — M545 Low back pain, unspecified: Secondary | ICD-10-CM

## 2020-08-10 DIAGNOSIS — F09 Unspecified mental disorder due to known physiological condition: Secondary | ICD-10-CM | POA: Insufficient documentation

## 2020-08-10 DIAGNOSIS — R208 Other disturbances of skin sensation: Secondary | ICD-10-CM | POA: Insufficient documentation

## 2020-08-10 DIAGNOSIS — I6990 Unspecified sequelae of unspecified cerebrovascular disease: Secondary | ICD-10-CM | POA: Diagnosis present

## 2020-08-10 DIAGNOSIS — I611 Nontraumatic intracerebral hemorrhage in hemisphere, cortical: Secondary | ICD-10-CM | POA: Diagnosis present

## 2020-08-10 MED ORDER — BACLOFEN 10 MG PO TABS: 10.0000 mg | ORAL_TABLET | Freq: Every day | ORAL | 3 refills | Status: DC | PRN

## 2020-08-10 NOTE — Patient Instructions (Signed)
Perform sun salutations daily for 30 minutes  Walking/aerobic exercises for 30 minutes daily  Engage in cognitively stimulating activities for 1 hour daily - ex: crossword puzzles, sudoko, etc

## 2020-08-10 NOTE — Progress Notes (Signed)
Subjective:    Patient ID: Hector Pugh, male    DOB: 03-13-1961, 59 y.o.   MRN: [id number]  HPI  Male with history of pancreatic cancer, T2DM, HTN presents for follow up for left temporal lobe hemorrhage.   Last clinic visit 02/03/2020.  Since that time, patient states he has not been doing HEP.  Wife supplements history.  She has noted memory deficits. Patient is relatively sedentary with limited physical and cognitive stimulation since Covid. He continues to follow up with Neurology. He is driving without difficulty. He continues to have intermittent back pain. Mild benefit with Baclofen. He notes stable dysesthesias.  He has return of LLE pain, states he was receiving ESIs.   Pain Inventory Average Pain 0 Pain Right Now 0 My pain is no pain  In the last 24 hours, has pain interfered with the following? General activity 0 Relation with others 0 Enjoyment of life 0 What TIME of day is your pain at its worst? no pain Sleep (in general) Good  Pain is worse with: no pain Pain improves with: no pain Relief from Meds: no pain  Mobility walk without assistance how many minutes can you walk? 60 do you drive?  yes  Function disabled: date disabled .  Neuro/Psych No problems in this area  Prior Studies Any changes since last visit?  no  Physicians involved in your care Any changes since last visit?  no   Family History  Problem Relation Age of Onset  . Cancer Mother        esophageal carcinoma   Social History   Socioeconomic History  . Marital status: Married    Spouse name: Not on file  . Number of children: Not on file  . Years of education: Not on file  . Highest education level: Not on file  Occupational History  . Not on file  Tobacco Use  . Smoking status: Never Smoker  . Smokeless tobacco: Never Used  Substance and Sexual Activity  . Alcohol use: No  . Drug use: No  . Sexual activity: Never  Other Topics Concern  . Not on file  Social History  Narrative   Lives with wife, daughter Puja   Social Determinants of Health   Financial Resource Strain:   . Difficulty of Paying Living Expenses: Not on file  Food Insecurity:   . Worried About Charity fundraiser in the Last Year: Not on file  . Ran Out of Food in the Last Year: Not on file  Transportation Needs:   . Lack of Transportation (Medical): Not on file  . Lack of Transportation (Non-Medical): Not on file  Physical Activity:   . Days of Exercise per Week: Not on file  . Minutes of Exercise per Session: Not on file  Stress:   . Feeling of Stress : Not on file  Social Connections:   . Frequency of Communication with Friends and Family: Not on file  . Frequency of Social Gatherings with Friends and Family: Not on file  . Attends Religious Services: Not on file  . Active Member of Clubs or Organizations: Not on file  . Attends Archivist Meetings: Not on file  . Marital Status: Not on file   Past Surgical History:  Procedure Laterality Date  . circucision    . circumsision    . COLONOSCOPY N/A 04/04/2018   Procedure: COLONOSCOPY;  Surgeon: Leighton Ruff, MD;  Location: [facility];  Service: Endoscopy;  Laterality: N/A;  . CRANIOTOMY  Left 04/05/2018   Procedure: Left Temporal CRANIOTOMY HEMATOMA EVACUATION of Intracranial Hemorrhage;  Surgeon: Ashok Pall, MD;  Location: [facility];  Service: Neurosurgery;  Laterality: Left;  . ERCP  11/08/2012   Procedure: ENDOSCOPIC RETROGRADE CHOLANGIOPANCREATOGRAPHY (ERCP);  Surgeon: Jeryl Columbia, MD;  Location: [facility];  Service: Gastroenterology;  Laterality: N/A;  . EUS  11/08/2012   Procedure: UPPER ENDOSCOPIC ULTRASOUND (EUS) LINEAR;  Surgeon: Arta Silence, MD;  Location: [facility];  Service: Endoscopy;  Laterality: N/A;  . FINE NEEDLE ASPIRATION  11/08/2012   Procedure: FINE NEEDLE ASPIRATION (FNA) LINEAR;  Surgeon: Arta Silence, MD;  Location: [facility];  Service: Endoscopy;  Laterality: N/A;  . HERNIA REPAIR      RIGHT  . LAPAROSCOPY  12/12/2012   Procedure: LAPAROSCOPY DIAGNOSTIC;  Surgeon: Stark Klein, MD;  Location: [facility];  Service: General;  Laterality: N/A;  . Left Foot Surgery    . Left Foot Surgery    . VASECTOMY    . WHIPPLE PROCEDURE  12/12/2012   Procedure: WHIPPLE PROCEDURE;  Surgeon: Stark Klein, MD;  Location: [facility];  Service: General;  Laterality: N/A;   Past Medical History:  Diagnosis Date  . 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 Cancer ([place])   . Diabetes mellitus ([place]) 11/10/2012  . Diabetes mellitus without complication (HCC)    diet controlled  . HTN (hypertension) 11/10/2012  . Hypertension   . 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 Pancreatic cancer ([place])   . Pancreatitis 11/10/2012  . Seasonal allergies    BP (!) 151/72   Pulse (!) 46   Temp 97.9 F (36.6 C)   Ht 5\' 3"  (1.6 m)   Wt 157 lb (71.2 kg)   SpO2 98%   BMI 27.81 kg/m   Opioid Risk Score:   Fall Risk Score:  `1  Depression screen PHQ 2/9  Depression screen [facility] 2/9 04/02/2019 05/09/2018  Decreased Interest 0 0  Down, Depressed, Hopeless 0 1  PHQ - 2 Score 0 1  Altered sleeping - 0  Tired, decreased energy - 1  Change in appetite - 0  Feeling bad or failure about yourself  - 0  Trouble concentrating - 1  Moving slowly or fidgety/restless - 0  Suicidal thoughts - 0  PHQ-9 Score - 3  Difficult doing work/chores - Not difficult at all   Review of Systems  Constitutional: Negative.   HENT: Negative.   Eyes: Negative.   Respiratory: Negative.   Cardiovascular: Negative.   Gastrointestinal: Negative.   Endocrine:       High/low blood sugar   Genitourinary: Negative.   Musculoskeletal: Negative.   Skin: Negative.   Allergic/Immunologic: Negative.   Neurological: Negative.   Hematological: Negative.   Psychiatric/Behavioral: Negative.       Objective:   Physical Exam Constitutional: NAD.   Musc: No edema and no tenderness. Neurological: He is  Alert. Aphasia improved Right inattention, overall improving, patient more aware and able to compensate Motor: Grossly 5/5 throughout Neg SLR    Assessment & Plan:  Male with history of pancreatic cancer, T2DM, HTN presents for follow up for left temporal lobe hemorrhage.   1. 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 Chronic low back pain:   Cont stretching/core strengthening exercise  Exacerbated by excessive activity  Baclofen 10 daily PRN  Recommend sun salutations again  Discussed trigger point injections - patient would like to hold off at present and follow up when pain more severe  ?ESIs in past for ?radicular symptoms - inconsistent history  Will consider MRI   3. Coping  Improving  Is not longer follow up with Neuropsych  4. Transient dysesthesias  Encouraged use of protective equipment in hot sunny days- glasses, cap, water intake, etc  Improving

## 2020-08-25 ENCOUNTER — Other Ambulatory Visit: Payer: Self-pay

## 2020-08-25 ENCOUNTER — Encounter: Payer: Managed Care, Other (non HMO) | Admitting: Psychology

## 2020-08-25 DIAGNOSIS — I611 Nontraumatic intracerebral hemorrhage in hemisphere, cortical: Secondary | ICD-10-CM

## 2020-08-25 DIAGNOSIS — I6990 Unspecified sequelae of unspecified cerebrovascular disease: Secondary | ICD-10-CM | POA: Diagnosis not present

## 2020-08-25 DIAGNOSIS — F09 Unspecified mental disorder due to known physiological condition: Secondary | ICD-10-CM

## 2020-08-31 ENCOUNTER — Encounter: Payer: Self-pay | Admitting: Psychology

## 2020-08-31 NOTE — Progress Notes (Addendum)
Neuropsychology Visit  Patient:  Hector Pugh   DOB: 09-05-1961  MR Number: [id number]  Location: [facility] [place], [place] [id number] [place] [address] Dept: [phone]  Date of Service: 08/25/2020  Start: 8 AM End: 9 AM  Duration of Service: 1 Hour  Today's visit was an in person visit that was conducted in my outpatient clinic office.  The patient and his family members were present for this visit.  Provider/Observer:     Edgardo Roys PsyD  Chief Complaint:      Chief Complaint  Patient presents with  . Cerebrovascular Accident  . Other    Cognitive deficits, expressive language deficits and visual disturbance    Reason For Service:     Hector Pugh a 59 year old male referred by Dr. Posey Pronto for neuropsychological evaluation. The patient has a history of pancreatic difficulties that have now been determined to not be pancreatic cancer. The patient also has a history of type 2 diabetes, hypertension. The patient was admitted on 04/05/2018 with decreases in consciousness, vomiting episodes, difficulty speaking and right-sided weakness. The patient was found to have a large left temporal hemorrhage with anemia and mass-effect with shift. The patient was taken to the OR emergently for left craniotomy with evacuation of hemorrhage by Dr. Christella Noa. The patient developed aspiration pneumonia during hospitalization. The patient did well with significant improvement in functioning during comprehensive inpatient rehabilitation. Issues related to expressive and receptive aphasia and cognitive deficits were addressed.  The patient has continued to have speech difficulties, cognitive difficulties related to understanding and trouble with information processing. There continue to be some vision changes but the patient insists that he is not having memory issues  but the patient's family and therapist are all identified memory and cognitive issues along with residual aphasia. The patient is reading now but is not able to comprehend information has difficulty putting information together. The patient has been active in speech therapy and they have now hit their desired goals and part of the referral to me was to assess what should be done as far as further rehabilitative efforts. The patient has not returned to work and is not driving.  During today's visit, the patient reports that things are essentially better with the exception of his word finding difficulties and pronunciation/fluency.  The patient reports that his memory has improved but it is not back to baseline.  Patient reports that sometimes he just cannot think of the word that he wants to say but his speed of thinking is still slow but improving.  There still is ongoing visual neglect but he is aware of this neglect.  The patient completed driving classes and has been released to drive but right now only drives with another person in the car with him.  The patient reports that when he has to attend to 2 or more things such as people talking while driving that is harder and that he always has people be quiet during driving.  The patient has some left right confusion and right visual neglect.  The patient's wife reports that the patient continues to get upset when he is told no or says something and wants to be saying something else.  The patient is writing better but continues to have significant speech issues.  Treatment Interventions:  Therapeutic interventions around residual effects of his CVA including cognitive deficits and speech difficulties and visual-spatial deficits.  Participation Level:  Active  Participation Quality:  Appropriate and Redirectable      Behavioral Observation:  Well Groomed, Alert, and Appropriate.   Current Psychosocial Factors: The patient has started driving again but  is doing it on a limited basis and is well aware of his visual neglect issues and has developed strategies to work around that.  The patient continues with significant expressive language issues which are keeping him from being able to return back to his previous work.  Content of Session:   Today we reviewed changes in current issues as far as stressors and difficulties with residual effects of a late effects of his cerebrovascular accident which is a left temporal lobe hemorrhage.  Effectiveness of Interventions: The patient was a full participant in these therapeutic interventions and is aware of his limitations.  The patient is doing better coping with his inability to return to work and is much more understanding of these limitations and reasons why.  He is aware that at this point he is not able to do the same work activities that he had been able to do before.  Target Goals:   Helping the patient cope with and adapt to residual effects of his CVA including expressive language changes, visual changes and other cognitive difficulties.  Goals Last Reviewed:   08/25/2020  Goals Addressed Today:    Today we worked on better coping and adaptive skills around his residual effects from his left temporal lobe hemorrhage.  Impression/Diagnosis:   The patient does have residual continuing issues of cognitive deficits resulting from his CVA.  The patient is unable to perform previous work duties but has been able to return to more activities in life.  The patient is return to driving with knowledge of his residual visual deficits related to visual neglect.  The patient is cognizant of these changes and is adapting well and driving successfully.  He has not been able to return to work.  Diagnosis:   Late effects of cerebrovascular accident  Left temporal lobe hemorrhage (HCC)  Cognitive disorder    Ilean Skill, Psy.D. Clinical Psychologist Neuropsychologist          Summary of Results:                         Overall, the results of the current neuropsychological evaluation do suggest significant profound deficits relative to predicted levels of previous functioning.  There are significant residual/late effects of his cerebrovascular accident.  These effects are not simply focal deficits on specific areas or specific hemispheric functions but are identified for a broad range of neuropsychological functioning that would encompass most areas of cortically mediated cognitive functioning.  The patient's best area of functioning had to do with his auditory encoding/working memory which falls at the 30th percentile and was in the average range.  However even this score is likely to be significantly below premorbid functioning.  The patient's visual encoding/visual working memory was significantly below his auditory encoding memory with visual working memory in the bottom end of the low average range.  The patient showed significant relative deficits to a normative population with regard to verbal comprehension abilities including verbal reasoning and problem-solving and vocabulary.  The patient's current general fund of information and social judgment/comprehension are in the average range and he retains the ability to do well as far as his basic general fund of information and social skills abilities.  The patient showed deficits with regard to visual spatial abilities and  some aspects of visual reasoning and problem-solving.  The patient's information processing speed was his most impaired area of cognitive functioning with the exception of his auditory memory deficits.  The patient showed significant impairments with regard to information processing speed and visual scanning/visual searching abilities.  With regard to specific memory deficits there are significant memory deficits noted.  The patient did generally well with regard to auditory encoding abilities but had some significant difficulties with  visual encoding abilities.  However, the patient's auditory memory both immediate as well as delayed or significantly impaired.  Is generally well preserved auditory encoding versus significant deficits with regard to both immediate and delayed auditory memory suggest that his memory deficits are not related to significant inability to attend and encode the information but deficits with regard to organization and storage.  This also does not appear to be a simple retrieval deficit but related to storage and organization of information to be learned.  The pattern showed less significant deficits with regard to visual memory and retrieval and while they were in the low average range they are significantly below what would be predicted with regard to premorbid functioning.  Impression/Diagnosis:                     Overall, the results of the current objective neuropsychological evaluation are consistent with significant residual cognitive deficits.  While the patient has voiced strong desire to return to work as soon as he can, the current level of cognitive deficits in a broad range of neuropsychological areas would suggest that the patient is not ready to return to work at this time.  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  While the patient's social judgment and general fund of information are performing in the average range and he is able to show appropriate interpersonal functioning and good impulse control the patient is not likely to be able to perform the cognitive tasks of his job.  Also, the patient has reported a strong desire to begin driving again and the level of visual-spatial deficits and significant impairments with regard to information processing speed would also make that quite difficult and the patient is likely to be unable to effectively perform the cognitive demands needed for safe  driving.  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  The patient is continuing to show improvement and I would highly recommend ongoing and continuing rehabilitation efforts and is important that the patient remain as engaged as possible and day-to-day activities as well as cognitively stimulating activities.  We are still less than 1 year post temporal lobe hemorrhage and the patient is well within time constraints that would allow for further improvements in his overall cognitive functioning.  We will need to do repeat testing in approximately 9 months to objectively assess this improvement to help guide the patient in his decision making as far as returning to work and driving.  At this point right now the patient should not be returning to his previous job and should not be driving.  Diagnosis:                               Axis I: Left  temporal lobe hemorrhage (HCC)  Cognitive disorder  Expressive aphasia  Late effects of cerebrovascular accident   Ilean Skill, Psy.D. Neuropsychologist

## 2020-09-17 ENCOUNTER — Encounter: Payer: Self-pay | Admitting: Psychology

## 2020-09-17 ENCOUNTER — Encounter: Payer: Managed Care, Other (non HMO) | Attending: Physical Medicine & Rehabilitation | Admitting: Psychology

## 2020-09-17 ENCOUNTER — Other Ambulatory Visit: Payer: Self-pay

## 2020-09-17 DIAGNOSIS — I6990 Unspecified sequelae of unspecified cerebrovascular disease: Secondary | ICD-10-CM | POA: Diagnosis not present

## 2020-09-17 NOTE — Progress Notes (Addendum)
Neuropsychology Note  Hector Pugh completed 240 minutes of repeat neuropsychological testing with this provider.  The patient did not appear overtly distressed by the testing session, per behavioral observation or via self-report. Rest breaks were offered.   Mental Status/Behavioral Observations:    The patient arrived on time to his testing appointment. He was neatly dressed and well-groomed. He ambulated independently without assistance or difficulty. The patient was alert and oriented to person, place, time, and situation. His mood was euthymic with appropriate affect. He wore corrective lenses. He displayed good eye contact and openly answered all questions posed by the examiner. Psychomotor functioning was unremarkable. Rapport was good and established. Attention was poor. Speech was labored and unclear with reduced fluency; prosodic with normal volume. He displayed  evidence of poor articulation. Moderate word finding difficulty was noted. Thought processes appeared concrete, with no evidence of psychotic symptoms such as delusions and/or hallucinations. Recent memory appeared, especially for verbal material impaired. Insight and judgement seemed limited and fair, respectively . He was cooperative with the evaluation and appeared to put forth his best effort.    Tests Administered: . Wechsler Adult Intelligence Scale, 4th Edition (WAIS-IV) . Wechsler Memory Scale, 4th edition, Adult Battery (WMS-IV-A)  Results :   Composite Score Summary  Scale Sum of Scaled Scores Composite Score Percentile Rank 95% Conf. Interval Qualitative Description  Verbal Comprehension 13 VCI 68 2 64-75 Extremely Low  Perceptual Reasoning 20 PRI 81 10 76-88 Low Average  Working Memory 13 WMI 80 9 74-88 Low Average  Processing Speed 8 PSI 68 2 63-80 Extremely Low  Full Scale 54 FSIQ 70 2 67-75 Borderline  General Ability 33 GAI 72 3 68-78 Borderline   Verbal Comprehension Subtests Summary  Subtest Raw  Score Scaled Score Percentile Rank Reference Group Scaled Score SEM  Similarities 9 3 1 2  1.08  Vocabulary 14 4 2 5  0.73  Information 8 6 9 7  0.67   Perceptual Reasoning Subtests Summary  Subtest Raw Score Scaled Score Percentile Rank Reference Group Scaled Score SEM  Block Design 24 7 16 6  1.04  Matrix Reasoning 10 7 16 5  0.95  Visual Puzzles 7 6 9 5  0.99   Working Doctor, general practice Raw Score Scaled Score Percentile Rank Reference Group Scaled Score SEM  Digit Span 18 6 9 5  0.85  Arithmetic 11 7 16 8  1.04   Working Memory Process Score Summary  Process Score Raw Score Scaled Score Percentile Rank Base Rate SEM  Digit Span Forward 7 7 16  -- 1.44  Digit Span Backward 6 7 16  -- 1.27  Digit Span Sequencing 5 6 9  -- 1.37  Longest Digit Span Forward 5 -- -- 94.5 --  Longest Digit Span Backward 4 -- -- 79.5 --  Longest Digit Span Sequence 4 -- -- 96.5 --   Processing Speed Subtests Summary  Subtest Raw Score Scaled Score Percentile Rank Reference Group Scaled Score SEM  Symbol Search 8 2 0.4 1 1.31  Coding 36 6 9 4  0.99   Index Score Summary  Index Sum of Scaled Scores Index Score Percentile Rank 95% Confidence Interval Qualitative Descriptor  Auditory Memory (AMI) 18 67 1 62-75 Extremely Low  Visual Memory (VMI) 26 80 9 75-86 Low Average  Visual Working Memory (VWMI) 16 88 21 82-96 Low Average  Immediate Memory (IMI) 25 75 5 70-82 Borderline  Delayed Memory (DMI) 19 64 1 59-73 Extremely Low    Primary Subtest Scaled Score Summary  Subtest Domain  Raw Score Scaled Score Percentile Rank  Logical Memory I AM 13 4 2   Logical Memory II AM 12 6 9   Verbal Paired Associates I AM 9 4 2   Verbal [employer] II AM 3 4 2   Designs I VM 52 7 16  Designs II VM 43 8 25  Visual Reproduction I VM 33 10 50  Visual Reproduction II VM 0 1 0.1  Spatial Addition VWM 8 8 25   Symbol Span VWM 17 8 25      ABILITY-MEMORY ANALYSIS  Ability Score:  GAI: 72 Date of  Testing:  WAIS-IV; WMS-IV 2020/09/17  Predicted Difference Method   Index Predicted WMS-IV Index Score Actual WMS-IV Index Score Difference Critical Value  Significant Difference Y/N Base Rate  Auditory Memory 85 67 18 8.95 Y 5-10%  Visual Memory 83 80 3 8.82 N   Visual Working Memory 81 88 -7 11.24 N   Immediate Memory 82 75 7 10.35 N   Delayed Memory 84 64 20 10.08 Y 5%  Statistical significance (critical value) at the .01 level.   Feedback: Hector Pugh will return on 11/22/20 for an interactive feedback session with Dr. Sima Matas at which time his test performances, clinical impressions and treatment recommendations will be reviewed in detail. The patient understands he can contact our office should he require our assistance before this time.  Full report to follow.

## 2020-10-11 ENCOUNTER — Telehealth: Payer: Self-pay | Admitting: *Deleted

## 2020-10-11 NOTE — Telephone Encounter (Signed)
Dr Fredonia Highland called to speak with Dr Sima Matas regarding a disability claim.  He would like a call back, his number is [phone].

## 2020-10-14 ENCOUNTER — Other Ambulatory Visit: Payer: Self-pay

## 2020-10-14 ENCOUNTER — Encounter
Payer: Managed Care, Other (non HMO) | Attending: Physical Medicine & Rehabilitation | Admitting: Physical Medicine & Rehabilitation

## 2020-10-14 ENCOUNTER — Encounter: Payer: Self-pay | Admitting: Physical Medicine & Rehabilitation

## 2020-10-14 VITALS — BP 155/77 | HR 54 | Temp 98.2°F | Ht 63.0 in | Wt 160.4 lb

## 2020-10-14 DIAGNOSIS — R208 Other disturbances of skin sensation: Secondary | ICD-10-CM | POA: Insufficient documentation

## 2020-10-14 DIAGNOSIS — F09 Unspecified mental disorder due to known physiological condition: Secondary | ICD-10-CM | POA: Insufficient documentation

## 2020-10-14 DIAGNOSIS — R4701 Aphasia: Secondary | ICD-10-CM | POA: Insufficient documentation

## 2020-10-14 DIAGNOSIS — I6990 Unspecified sequelae of unspecified cerebrovascular disease: Secondary | ICD-10-CM | POA: Diagnosis not present

## 2020-10-14 DIAGNOSIS — I10 Essential (primary) hypertension: Secondary | ICD-10-CM | POA: Diagnosis not present

## 2020-10-14 DIAGNOSIS — I611 Nontraumatic intracerebral hemorrhage in hemisphere, cortical: Secondary | ICD-10-CM | POA: Insufficient documentation

## 2020-10-14 NOTE — Progress Notes (Signed)
Subjective:    Patient ID: Hector Pugh, male    DOB: 1961-07-22, 59 y.o.   MRN: [id number]  HPI  Male with history of pancreatic cancer, T2DM, HTN presents for follow up for left temporal lobe hemorrhage.   Last clinic visit 08/10/20.  Wife supplements history. Since that time, pt states he is doing HEP. He is driving with someone. He is doing some activities with mental stimulation. He is doing more yard. He has not needed Baclofen. He notes sinus headaches. He has not done sun salutations. Dyesthesias have improved, particularly with temperature changes.   Pain Inventory Average Pain 1 Pain Right Now 1 My pain is tightness in the upper back between the shoulders   In the last 24 hours, has pain interfered with the following? General activity 0 Relation with others 0 Enjoyment of life 0 What TIME of day is your pain at its worst? morning Sleep (in general) Poor  Pain is worse with: sitting and inactivity Pain improves with: movement - streching the area Relief from Meds: no pain meds taken  Family History  Problem Relation Age of Onset  . Cancer Mother        esophageal carcinoma   Social History   Socioeconomic History  . Marital status: Married    Spouse name: Not on file  . Number of children: Not on file  . Years of education: Not on file  . Highest education level: Not on file  Occupational History  . Not on file  Tobacco Use  . Smoking status: Never Smoker  . Smokeless tobacco: Never Used  Vaping Use  . Vaping Use: Never used  Substance and Sexual Activity  . Alcohol use: No  . Drug use: No  . Sexual activity: Never  Other Topics Concern  . Not on file  Social History Narrative   Lives with wife, daughter Puja   Social Determinants of Health   Financial Resource Strain:   . Difficulty of Paying Living Expenses: Not on file  Food Insecurity:   . Worried About Charity fundraiser in the Last Year: Not on file  . Ran Out of Food in the Last Year: Not  on file  Transportation Needs:   . Lack of Transportation (Medical): Not on file  . Lack of Transportation (Non-Medical): Not on file  Physical Activity:   . Days of Exercise per Week: Not on file  . Minutes of Exercise per Session: Not on file  Stress:   . Feeling of Stress : Not on file  Social Connections:   . Frequency of Communication with Friends and Family: Not on file  . Frequency of Social Gatherings with Friends and Family: Not on file  . Attends Religious Services: Not on file  . Active Member of Clubs or Organizations: Not on file  . Attends Archivist Meetings: Not on file  . Marital Status: Not on file   Past Surgical History:  Procedure Laterality Date  . circucision    . circumsision    . COLONOSCOPY N/A 04/04/2018   Procedure: COLONOSCOPY;  Surgeon: Leighton Ruff, MD;  Location: [facility];  Service: Endoscopy;  Laterality: N/A;  . CRANIOTOMY Left 04/05/2018   Procedure: Left Temporal CRANIOTOMY HEMATOMA EVACUATION of Intracranial Hemorrhage;  Surgeon: Ashok Pall, MD;  Location: [facility];  Service: Neurosurgery;  Laterality: Left;  . ERCP  11/08/2012   Procedure: ENDOSCOPIC RETROGRADE CHOLANGIOPANCREATOGRAPHY (ERCP);  Surgeon: Jeryl Columbia, MD;  Location: [facility];  Service: Gastroenterology;  Laterality: N/A;  . EUS  11/08/2012   Procedure: UPPER ENDOSCOPIC ULTRASOUND (EUS) LINEAR;  Surgeon: Arta Silence, MD;  Location: [facility];  Service: Endoscopy;  Laterality: N/A;  . FINE NEEDLE ASPIRATION  11/08/2012   Procedure: FINE NEEDLE ASPIRATION (FNA) LINEAR;  Surgeon: Arta Silence, MD;  Location: [facility];  Service: Endoscopy;  Laterality: N/A;  . HERNIA REPAIR     RIGHT  . LAPAROSCOPY  12/12/2012   Procedure: LAPAROSCOPY DIAGNOSTIC;  Surgeon: Stark Klein, MD;  Location: [facility];  Service: General;  Laterality: N/A;  . Left Foot Surgery    . Left Foot Surgery    . VASECTOMY    . WHIPPLE PROCEDURE  12/12/2012   Procedure: WHIPPLE PROCEDURE;   Surgeon: Stark Klein, MD;  Location: [facility];  Service: General;  Laterality: N/A;   Past Medical History:  Diagnosis Date  . 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 Cancer ([place])   . Diabetes mellitus ([place]) 11/10/2012  . Diabetes mellitus without complication ([facility])    diet controlled  . HTN (hypertension) 11/10/2012  . Hypertension   . 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 Pancreatic cancer ([place])   . Pancreatitis 11/10/2012  . Seasonal allergies    BP (!) 155/77   Pulse (!) 54   Temp 98.2 F (36.8 C)   Ht 5\' 3"  (1.6 m)   Wt 160 lb 6.4 oz (72.8 kg)   SpO2 99%   BMI 28.41 kg/m   Opioid Risk Score:   Fall Risk Score:  `1  Depression screen PHQ 2/9  Depression screen [facility] 2/9 10/14/2020 04/02/2019 05/09/2018  Decreased Interest 0 0 0  Down, Depressed, Hopeless 0 0 1  PHQ - 2 Score 0 0 1  Altered sleeping - - 0  Tired, decreased energy - - 1  Change in appetite - - 0  Feeling bad or failure about yourself  - - 0  Trouble concentrating - - 1  Moving slowly or fidgety/restless - - 0  Suicidal thoughts - - 0  PHQ-9 Score - - 3  Difficult doing work/chores - - Not difficult at all   Review of Systems  Constitutional: Negative.   HENT: Negative.   Eyes: Negative.   Respiratory: Negative.   Cardiovascular: Negative.   Gastrointestinal: Negative.   Endocrine:       High/low blood sugar   Genitourinary: Negative.   Musculoskeletal: Negative.        Stiffness or tightness upper back  Skin: Negative.   Allergic/Immunologic: Negative.   Neurological:       Intermittent dysesthesias  Hematological: Negative.   Psychiatric/Behavioral: Negative.   All other systems reviewed and are negative.     Objective:   Physical Exam  Constitutional: No distress . Vital signs reviewed. HENT: Normocephalic.  Atraumatic. Eyes: EOMI. No discharge. Cardiovascular: No JVD.   Respiratory: Normal effort.  No stridor.   GI: Non-distended.   Skin:  Warm and dry.  Intact. Psych: Normal mood.  Normal behavior. Musc: No edema in extremities.  No tenderness in extremities. Neurological: Alert Aphasia improving Right inattention, overall improving, patient more aware and able to compensate Motor: Grossly 5/5 throughout    Assessment & Plan:  Male with history of pancreatic cancer, T2DM, HTN presents for follow up for left temporal lobe hemorrhage.   1. 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 Chronic low back pain:   Cont stretching/core strengthening exercise  Exacerbated by excessive activity  Baclofen 10 daily PRN, rarely using  Recommend sun salutations again  Discussed trigger point injections - patient would like to hold off at present and follow up when pain more severe  ?ESIs in past for ?radicular symptoms - inconsistent history  Will consider MRI, not necessary at this point   3. Coping  Improving  Continue to follow up with Neuropsych  4. Transient dysesthesias - predominantly with temperature changes   Encouraged use of protective equipment in hot sunny days- glasses, cap, water intake, etc  Improving overall  Pt would like to RTC in 6 months

## 2020-10-19 ENCOUNTER — Other Ambulatory Visit: Payer: Self-pay

## 2020-10-19 ENCOUNTER — Encounter (HOSPITAL_BASED_OUTPATIENT_CLINIC_OR_DEPARTMENT_OTHER): Payer: Managed Care, Other (non HMO) | Admitting: Psychology

## 2020-10-19 DIAGNOSIS — R4701 Aphasia: Secondary | ICD-10-CM

## 2020-10-19 DIAGNOSIS — F09 Unspecified mental disorder due to known physiological condition: Secondary | ICD-10-CM

## 2020-10-19 DIAGNOSIS — I611 Nontraumatic intracerebral hemorrhage in hemisphere, cortical: Secondary | ICD-10-CM | POA: Diagnosis not present

## 2020-10-19 DIAGNOSIS — I6990 Unspecified sequelae of unspecified cerebrovascular disease: Secondary | ICD-10-CM | POA: Diagnosis not present

## 2020-10-19 NOTE — Progress Notes (Addendum)
Patient:  Hector Pugh   DOB: 05/29/1961  MR Number: [id number]  Location: [facility] [place], [place] [id number] [place] [address] Dept: [phone]  Start: 8 AM End: 9 AM  Provider/Observer:     Hector Roys PsyD  Chief Complaint:      No chief complaint on file.   Reason For Service:     Hector Pugh is a 59 year old male referred by Dr. Posey Pugh for neuropsychological evaluation.    Today's report is for the follow-up neuropsychological evaluation with repeat testing.  The patient's initial neuropsychological test procedures were conducted and completed on 01/23/2019 with his initial neuropsychological evaluation report found in his EMR dated 01/23/2019.  The patient has a history of pancreatic difficulties that have now been determined to not be pancreatic cancer.  The patient also has a history of type 2 diabetes, hypertension.  The patient was admitted on 04/05/2018 with decreases in consciousness, vomiting episodes, difficulty speaking and right-sided weakness.  The patient was found to have a large left temporal hemorrhage with anemia and mass-effect with shift.  The patient was taken to the OR emergently for left craniotomy with evacuation of hemorrhage by Dr. Christella Pugh.  The patient developed aspiration pneumonia during hospitalization.  The patient did well with significant improvement in functioning during comprehensive inpatient rehabilitation.  Issues related to expressive and receptive aphasia and cognitive deficits were addressed.  The patient had initially continued to have speech difficulties, cognitive difficulties related to understanding and trouble with information processing following his cerebrovascular accident that occurred on 04/05/2018.  There continued to be some vision changes but the patient insisted he was not having memory issues but the patient's  family and therapist all identified memory and cognitive issues along with residual aphasia.  The patient had begun reading but was not able to comprehend information has difficulty putting information together.  The patient has been active in speech therapy and he hit their desired goals and part of the referral to me was to assess what should be done as far as further rehabilitative efforts.  The patient has not returned to work and is not driving.  During the clinical interview completed on 08/25/2020 the patient reported that things had been better over the past year or so with the exception of his word finding difficulties and pronunciation/fluency.  The patient reported that his memory was improved but it was not back to baseline.  The patient reports that he has difficulty finding the word that he wants to say but his speed of thinking is still slow but improving.  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  The patient has completed driving classes/assessment and has been released to drive but right now only drives with another person in the car with him.  The patient reports that he has difficulty when he has to attend to 2 or more stimuli such as people talking while he is driving and people avoid talking when he is driving.  The patient has some right left confusion and right visual neglect.  The patient's wife reports that the patient continues to get upset when he is told no or says something that he is not intending and ultimately says something else and a paraphasic type error.  The patient continues to have improvement in motor function including his handwriting but he continues to have  significant speech deficits.  Testing Administered:  The patient was administered the Wechsler Adult Intelligence Scale-IV as well as the Wechsler Memory Scale-IV.  Participation Level:   Hector Pugh completed 240 minutes of  repeat neuropsychological testing with this provider.  The patient did not appear overtly distressed by the testing session, per behavioral observation or via self-report. Rest breaks were offered.   Mental Status/Behavioral Observations from 09/17/2020:                The patient arrived on time to his testing appointment. He was neatly dressed and well-groomed. He ambulated independently without assistance or difficulty. The patient was alert and oriented to person, place, time, and situation. His mood was euthymic with appropriate affect. He wore corrective lenses. He displayed good eye contact and openly answered all questions posed by the examiner. Psychomotor functioning was unremarkable. Rapport was good and established. Attention was poor. Speech was labored and unclear with reduced fluency; prosodic with normal volume. He displayed  evidence of poor articulation. Moderate word finding difficulty was noted. Thought processes appeared concrete, with no evidence of psychotic symptoms such as delusions and/or hallucinations. Recent memory appeared, especially for verbal material impaired. Insight and judgement seemed limited and fair, respectively . He was cooperative with the evaluation and appeared to put forth his best effort.    Tests Administered:  Wechsler Adult Intelligence Scale, 4th Edition (WAIS-IV)  Wechsler Memory Scale, 4th edition, Adult Battery (WMS-IV-A)   Test Results:   The patient was administered repeat testing for the Wechsler Adult Intelligence Scale-IV and the Wechsler Memory Scale-IV.  The patient appeared to work his hardest throughout this testing situation but did have periods of significant poor attention and distractibility and was frustrated at times with difficulties clearly articulating himself.  There were significant deficits noted on multiple areas of functioning.  2020:   Composite Score Summary  Scale Sum of Scaled Scores Composite Score Percentile Rank  95% Conf. Interval Qualitative Description  Verbal Comprehension 19 VCI 80 9 75-86 Low Average  Perceptual Reasoning 22 PRI 84 14 79-91 Low Average  Working Memory 17 WMI 92 30 86-99 Average  Processing Speed 11 PSI 76 5 70-87 Borderline  Full Scale 69 FSIQ 79 8 75-83 Borderline  General Ability 41 GAI 80 9 76-86 Low Average    2021:  Composite Score Summary   Scale Sum of Scaled Scores Composite Score Percentile Rank 95% Conf. Interval Qualitative Description  Verbal Comprehension 13 VCI 68 2 64-75 Extremely Low  Perceptual Reasoning 20 PRI 81 10 76-88 Low Average  Working Memory 13 WMI 80 9 74-88 Low Average  Processing Speed 8 PSI 68 2 63-80 Extremely Low  Full Scale 54 FSIQ 70 2 67-75 Borderline  General Ability 33 GAI 72 3 68-78 Borderline   In comparison to the initial assessment done in 2020, the patient actually showed a worsening in performance with his current assessment in both global performance as well as individual component indices.  There was a consistent general lowering across the board as to current areas of functioning but a consistent pattern of areas of deficits.  The patient produced a current global functioning index including the full-scale IQ score of 70 and general abilities index score of 72.  The assessment last year produced a full-scale IQ score of 79 in general abilities index score of 80.  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     2020:  Verbal Comprehension Subtests Summary  Subtest Raw Score Scaled Score Percentile Rank Reference  Group Scaled Score SEM  Similarities 16 6 9 5  1.08  Vocabulary 16 5 5 5  0.73  Information 11 8 25 9  0.67  (Comprehension) 25 10 50 11 1.08    2021:  Verbal Comprehension Subtests Summary   Subtest Raw Score Scaled Score Percentile Rank Reference Group Scaled Score SEM  Similarities 9 3 1 2  1.08  Vocabulary 14 4 2 5  0.73  Information 8 6 9 7  0.67   The patient showed a similar pattern to the global index scores of a drop in performance between 2020 2021 for his verbal comprehension index scores.  In 2020 he produced a verbal comprehension index score of 80 which fell at the 9th percentile.  On the current assessment the patient produced a verbal comprehension index score of 68 which falls at the 2nd percentile.  This current performance falls at the extremely low range and is significantly below his 2020 assessment but of similar pattern overall but a significant reduction primarily in verbal reasoning and problem-solving and reductions to a lesser extent on measures of his vocabulary knowledge and general fund of information.  However, this particular area of cognitive performance along with information processing speed were his most significantly impaired in 2020 and continue to be the areas of most significant impairment in 2021.   2020:  Perceptual Reasoning Subtests Summary  Subtest Raw Score Scaled Score Percentile Rank Reference Group Scaled Score SEM  Block Design 24 7 16 6  1.04  Matrix Reasoning 12 8 25 6  0.95  Visual Puzzles 9 7 16 6  0.99  (Figure Weights) 12 10 50 8 0.99  (Picture Completion) 6 6 9 5  1.12    2021:  Perceptual Reasoning Subtests Summary   Subtest Raw Score Scaled Score Percentile Rank Reference Group Scaled Score SEM  Block Design 24 7 16 6  1.04  Matrix Reasoning 10 7 16 5  0.95  Visual Puzzles 7 6 9 5  0.99   While the patient performed slightly below previous assessment levels this was a generally consistent performance in comparing the 2 assessment  dates.  In 2020, the patient produced a perceptual reasoning index score of 84 which falls at the 14th percentile and his performance in 2021 produced a perceptual reasoning index score of 81 and fell in the 10th percentile.  The patient continues to show significant visual-spatial and visual analysis deficits particularly when looking at his education occupational history.  The patient has deficits relative to both the normative population as well as significant deficits relative to premorbid estimates for visual analysis and organization, visual reasoning and problem solving and visual estimation and judgment abilities.   2020:  Working Doctor, general practice Raw Score Scaled Score Percentile Rank Reference Group Scaled Score SEM  Digit Span 18 6 9 5  0.85  Arithmetic 15 11 63 11 1.04  (Letter-Number Seq.) 18 9 37 8 1.08    2021:  Working Print production planner Raw Score Scaled Score Percentile Rank Reference Group Scaled Score SEM  Digit Span 18 6 9 5  0.85  Arithmetic 11 7 16 8  1.04   The patient also  had a similar pattern of reduced performance relative to 2020 assessment for measures of working memory and auditory encoding components.  However, the patient's primary encoding abilities state exactly the same as the initial assessment with significant deficits for auditory encoding and initially processing information.  The patient's reduction in his current assessment was solely related to his greater difficulties on the arithmetic subtest which is a more complex measure requiring greater attention and focus.  2020:  Processing Speed Subtests Summary  Subtest Raw Score Scaled Score Percentile Rank Reference Group Scaled Score SEM  Symbol Search 12 4 2 3  1.31  Coding 43 7 16 5  0.99  (Cancellation) 21 5 5 4  1.34    2021:  Processing Speed Subtests Summary   Subtest Raw Score Scaled Score Percentile Rank Reference Group Scaled Score SEM  Symbol Search 8 2 0.4 1  1.31  Coding 36 6 9 4  0.99   The patient also showed a similar pattern of decreased performance on measures of information processing and visual scanning/visual searching abilities.  On the 2020 assessment he produced a processing speed index score of 76 which fell at the 5th percentile and on the current assessment produced a processing speed index score of 68 which fell at the 2nd percentile relative to a normative population.  While his performance is worse this time by 8 standard points it did present a similar pattern with greater deficits on the symbol search subtest versus the coding subtest performance.  This pattern suggests greater difficulties with visual scanning horizontally versus vertically.   Memory:   ABILITY-MEMORY ANALYSIS  Ability Score:  GAI: 80 Date of Testing:  WAIS-IV; WMS-IV 2019/01/20  Predicted Difference Method   Index Predicted WMS-IV Index Score Actual WMS-IV Index Score Difference Critical Value  Significant Difference Y/N Base Rate  Auditory Memory 89 77 12 8.95 Y 15-20%  Visual Memory 88 85 3 8.82 N   Visual Working Memory 87 80 7 11.24 N   Immediate Memory 87 75 12 10.35 Y 15%  Delayed Memory 88 81 7 10.08 N   Statistical significance (critical value) at the .01 level.    2021:  ABILITY-MEMORY ANALYSIS  Ability Score:    GAI: 72 Date of Testing:           WAIS-IV; WMS-IV 2020/09/17           Predicted Difference Method   Index Predicted WMS-IV Index Score Actual WMS-IV Index Score Difference Critical Value  Significant Difference Y/N Base Rate  Auditory Memory 85 67 18 8.95 Y 5-10%  Visual Memory 83 80 3 8.82 N   Visual Working Memory 81 88 -7 11.24 N   Immediate Memory 82 75 7 10.35 N   Delayed Memory 84 64 20 10.08 Y 5%  Statistical significance (critical value) at the .01 level.    The patient was also administered the [employer] with comparisons to his performance in 2020.  While the patient showed a  general reduction in overall memory components relative to his previous testing there was significant consistency when looking at the patterns of differences between previous testing and current testing around the abilities memory analysis and the differences in performance.  The patient showed worse auditory memory components currently than his previous assessment and only slight changes with regard to visual memory and improvements with regard to visual working memory.  Immediate memory functions were identical to the initial assessment with significant impairments noted for immediate memory but the patient had significant worsening  and delayed memory components likely related to greater difficulties with retrieval of information if it was actually stored.  Again, the pattern is very consistent with initial testing with greatest deficits have to do with auditory memory functions versus visual memory functions, greater visual working abilities versus auditory working memory abilities (visual encoding greater than auditory encoding) and the greatest change between the current assessment versus the previous assessment was with regard to delayed memory indices.  Summary of Results:  Overall, the results of the current neuropsychological evaluation do continue to show significant and profound deficits relative to predicted levels of premorbid/historical functioning.  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  The patient continues to display significant residual/late effects of his cerebrovascular accident.  While the primary focal areas of deficits continue to be left hemispheric in nature there continue to also be significant global reductions in overall functioning levels and there were no individual subtest performances or composite index scores that were consistent with premorbid functioning  although the patient's best area of performance was within the arithmetic subtest it was still in the average range and I would suspect the patient had been performing in the high average to superior range historically.  I do think that the current assessment is valid and these lowering scores are not indicative of efforts to try to frame greater deficits but are more indicative of much poor performance on attentional components particularly auditory encoding abilities and difficulty on delayed memory functions.  The general worsening was most focal on items that require the greatest level of effort but there was extreme consistency and relative strengths and weaknesses in a very similar overall pattern of primary left hemispheric deficits in conjunction with global cognitive deficits across the board.  His current performance does clearly suggest ongoing and continued cognitive deficits that have not improved over the past year and continue to be significant and in fact greater difficulties with effort and motivation, auditory encoding/attentional changes continue to be quite problematic. Auditory memory functions including auditory encoding, initial storage and organization and later retrieval of information clearly are more impaired than visual memory and learning components.  However, visual learning and memory are also impaired particularly in comparison to estimations based on premorbid/historical information.  Impression/Diagnosis:   Overall, the results of the current objective neuropsychological evaluation along with comparisons to the previous 2020 evaluation are consistent with ongoing and significant residual cognitive deficits.  In fact, the patient's frustration with his lack of further recovery, ongoing expressive language deficits and global cognitive deficits with left hemispheric function greater than right hemispheric deficits continue.  The patient appears to have greater difficulty with  horizontal visual scanning versus vertical visual scanning and while there did not appear to be significant visual neglect noted there are clearly ongoing deficits with regard to visual analysis and visual information processing speed.  Global reductions in information processing speed are also noted.  The patient is having significant ongoing deficits with regard to verbal learning and memory and to a lesser extent visual memory and learning.  Auditory encoding displays greater deficits that visual encoding abilities.  I do think that the worsening performance on the current evaluation versus the 2020 evaluation are likely due to greater attentional weaknesses this time potentially indicative of some depression and other functional issues that have further developed with his inability to return to work and regain the degree of functioning that he  had prior to his cerebrovascular accident.  There was a significant consistency in the overall pattern of strengths and weaknesses with some individual subtest performed in the exact same range as previously tested.  The patient clearly has significant global deficits and significant focal deficits for expressive language and word finding abilities including paraphasic errors, auditory memory greater than visual memory deficits but global memory deficits noted, visual-spatial and visual analysis deficits, reduction in overall information processing speed with particular greater deficits for horizontal scanning and searching, and reduction in deficits in executive functioning.  While the patient did show significant improvements in his overall functioning from the severity initially observed while he was in the inpatient rehabilitation unit related to his left temporal lobe hemorrhage, the patient continues to have residual and ongoing significant late effects of his cerebrovascular accident.  The patient has not shown any patterns of improvement over the past year and in  fact there are global reductions overall.  I do think that these current reductions in his functioning are likely related to increase in frustration with his lack of improvements in ability to work, which has been a very strong goal for the patient, frustration in general and likely some worsening of symptoms related to depression.  The particular areas of greatest change in worsening in the current assessment versus previous assessment would be areas that would be most likely to be of greatest impact for the development of functional issues such as major depressive/clinical depression and we will address this particular issue during the feedback session with the patient.  Again, the patient continues to have significant severe residual deficits of his 2019 cerebrovascular/hemorrhagic event.  The patient is not functional level that would allow him to return to work due to multiple areas of ongoing and continued cognitive deficits.  I understand that the patient has been able to complete a functional driving test and has returned to driving in a very limited capacity.  I strongly advise great caution with the patient driving due to ongoing and residual cognitive deficits.    Diagnosis:    Late effects of cerebrovascular accident  Expressive aphasia  Left temporal lobe hemorrhage (HCC)  Cognitive disorder   Ilean Skill, Psy.D. Clinical Neuropsychologist

## 2020-11-22 ENCOUNTER — Encounter: Payer: Managed Care, Other (non HMO) | Attending: Physical Medicine & Rehabilitation | Admitting: Psychology

## 2020-11-22 ENCOUNTER — Other Ambulatory Visit: Payer: Self-pay

## 2020-11-22 DIAGNOSIS — R4701 Aphasia: Secondary | ICD-10-CM | POA: Diagnosis not present

## 2020-11-22 DIAGNOSIS — I6990 Unspecified sequelae of unspecified cerebrovascular disease: Secondary | ICD-10-CM | POA: Diagnosis not present

## 2020-11-22 DIAGNOSIS — F09 Unspecified mental disorder due to known physiological condition: Secondary | ICD-10-CM | POA: Insufficient documentation

## 2020-11-22 DIAGNOSIS — I611 Nontraumatic intracerebral hemorrhage in hemisphere, cortical: Secondary | ICD-10-CM | POA: Diagnosis not present

## 2020-11-25 ENCOUNTER — Encounter: Payer: Self-pay | Admitting: Psychology

## 2020-11-25 NOTE — Progress Notes (Signed)
11/25/2020: Today was an inpatient visit that was conducted in my outpatient clinic office.  The patient and myself were present with no other family members present.  Today we reviewed the results of the recent repeat neuropsychological testing with a complete neuropsychological test report being available in the patient's EMR dated 10/19/2020.  Below I will include the summary and impressions from this evaluation for convenience.  The patient was alert and oriented and with good emotional status throughout the visit today.  The patient continued with significant expressive language deficits but was able to adequately communicate but there were clear issues of word finding deficits with circumlocutions and paraphasic errors noted.  The patient acknowledged understanding that at this point he is not able to return to work.  There are major safety issues around his visual inattention deficits and visual and auditory memory deficits.  While at this point we do not plan to have further neuropsychological testing, I do remain available to the patient going forward particularly if problematic.  I do not expect a great deal of functional improvement as we are now roughly 2-1/2 years post cerebrovascular event.   Summary of Results:            Overall, the results of the current neuropsychological evaluation do continue to show significant and profound deficits relative to predicted levels of premorbid/historical functioning.  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  The patient continues to display significant residual/late effects of his cerebrovascular accident.  While the primary focal areas of deficits continue to be left hemispheric in nature there continue to also be significant global reductions in overall functioning levels and there were no individual subtest performances or composite index  scores that were consistent with premorbid functioning although the patient's best area of performance was within the arithmetic subtest it was still in the average range and I would suspect the patient had been performing in the high average to superior range historically.  I do think that the current assessment is valid and these lowering scores are not indicative of efforts to try to frame greater deficits but are more indicative of much poor performance on attentional components particularly auditory encoding abilities and difficulty on delayed memory functions.  The general worsening was most focal on items that require the greatest level of effort but there was extreme consistency and relative strengths and weaknesses in a very similar overall pattern of primary left hemispheric deficits in conjunction with global cognitive deficits across the board.  His current performance does clearly suggest ongoing and continued cognitive deficits that have not improved over the past year and continue to be significant and in fact greater difficulties with effort and motivation, auditory encoding/attentional changes continue to be quite problematic.         Auditory memory functions including auditory encoding, initial storage and organization and later retrieval of information clearly are more impaired than visual memory and learning components.  However, visual learning and memory are also impaired particularly in comparison to estimations based on premorbid/historical information.  Impression/Diagnosis:                     Overall, the results of the current objective neuropsychological evaluation along with comparisons to the previous 2020 evaluation are consistent with ongoing and significant residual cognitive deficits.  In fact, the patient's frustration with his lack of further recovery, ongoing expressive language deficits  and global cognitive deficits with left hemispheric function greater than right  hemispheric deficits continue.  The patient appears to have greater difficulty with horizontal visual scanning versus vertical visual scanning and while there did not appear to be significant visual neglect noted there are clearly ongoing deficits with regard to visual analysis and visual information processing speed.  Global reductions in information processing speed are also noted.  The patient is having significant ongoing deficits with regard to verbal learning and memory and to a lesser extent visual memory and learning.  Auditory encoding displays greater deficits that visual encoding abilities.  I do think that the worsening performance on the current evaluation versus the 2020 evaluation are likely due to greater attentional weaknesses this time potentially indicative of some depression and other functional issues that have further developed with his inability to return to work and regain the degree of functioning that he had prior to his cerebrovascular accident.  There was a significant consistency in the overall pattern of strengths and weaknesses with some individual subtest performed in the exact same range as previously tested.  The patient clearly has significant global deficits and significant focal deficits for expressive language and word finding abilities including paraphasic errors, auditory memory greater than visual memory deficits but global memory deficits noted, visual-spatial and visual analysis deficits, reduction in overall information processing speed with particular greater deficits for horizontal scanning and searching, and reduction in deficits in executive functioning.  While the patient did show significant improvements in his overall functioning from the severity initially observed while he was in the inpatient rehabilitation unit related to his left temporal lobe hemorrhage, the patient continues to have residual and ongoing significant late effects of his cerebrovascular  accident.  The patient has not shown any patterns of improvement over the past year and in fact there are global reductions overall.  I do think that these current reductions in his functioning are likely related to increase in frustration with his lack of improvements in ability to work, which has been a very strong goal for the patient, frustration in general and likely some worsening of symptoms related to depression.  The particular areas of greatest change in worsening in the current assessment versus previous assessment would be areas that would be most likely to be of greatest impact for the development of functional issues such as major depressive/clinical depression and we will address this particular issue during the feedback session with the patient.  Again, the patient continues to have significant severe residual deficits of his 2019 cerebrovascular/hemorrhagic event.  The patient is not functional level that would allow him to return to work due to multiple areas of ongoing and continued cognitive deficits.  I understand that the patient has been able to complete a functional driving test and has returned to driving in a very limited capacity.  I strongly advise great caution with the patient driving due to ongoing and residual cognitive deficits.    Diagnosis:                               Late effects of cerebrovascular accident  Expressive aphasia  Left temporal lobe hemorrhage (HCC)  Cognitive disorder   Arley Phenix, Psy.D. Clinical Neuropsychologist

## 2021-04-11 ENCOUNTER — Encounter: Payer: Self-pay | Admitting: Physical Medicine & Rehabilitation

## 2021-04-11 ENCOUNTER — Other Ambulatory Visit: Payer: Self-pay

## 2021-04-11 ENCOUNTER — Encounter
Payer: Managed Care, Other (non HMO) | Attending: Physical Medicine & Rehabilitation | Admitting: Physical Medicine & Rehabilitation

## 2021-04-11 VITALS — BP 149/82 | HR 52 | Temp 98.6°F | Ht 66.0 in | Wt 157.0 lb

## 2021-04-11 DIAGNOSIS — R208 Other disturbances of skin sensation: Secondary | ICD-10-CM | POA: Diagnosis present

## 2021-04-11 DIAGNOSIS — I6990 Unspecified sequelae of unspecified cerebrovascular disease: Secondary | ICD-10-CM | POA: Diagnosis present

## 2021-04-11 DIAGNOSIS — M545 Low back pain, unspecified: Secondary | ICD-10-CM

## 2021-04-11 DIAGNOSIS — F09 Unspecified mental disorder due to known physiological condition: Secondary | ICD-10-CM

## 2021-04-11 DIAGNOSIS — I6930 Unspecified sequelae of cerebral infarction: Secondary | ICD-10-CM

## 2021-04-11 DIAGNOSIS — G8929 Other chronic pain: Secondary | ICD-10-CM | POA: Insufficient documentation

## 2021-04-11 DIAGNOSIS — R4701 Aphasia: Secondary | ICD-10-CM | POA: Diagnosis not present

## 2021-04-11 NOTE — Progress Notes (Signed)
Subjective:    Patient ID: Hector Pugh, male    DOB: 1961/01/18, 60 y.o.   MRN: [id number]  HPI  Male with history of pancreatic cancer, T2DM, HTN presents for follow up for left temporal lobe hemorrhage.   Last clinic visit on 10/14/20.  Since that time, pt states he states he is ambulating, but no other exercises. He is driving, but wife believes it is dangerous. Back pain remains intermittent. He continues to have difficulty when being outside in the heat.    Pain Inventory Average Pain 3 Pain Right Now 0 My pain is tightness in the upper back between the shoulders   In the last 24 hours, has pain interfered with the following? General activity 1 Relation with others 1 Enjoyment of life 1 What TIME of day is your pain at its worst? daytime, night Sleep (in general) Good  Pain is worse with: bending Pain improves with: heat/ice, therapy/exercise and medication Relief from Meds: 0  Family History  Problem Relation Age of Onset  . Cancer Mother        esophageal carcinoma   Social History   Socioeconomic History  . Marital status: Married    Spouse name: Not on file  . Number of children: Not on file  . Years of education: Not on file  . Highest education level: Not on file  Occupational History  . Not on file  Tobacco Use  . Smoking status: Never Smoker  . Smokeless tobacco: Never Used  Vaping Use  . Vaping Use: Never used  Substance and Sexual Activity  . Alcohol use: No  . Drug use: No  . Sexual activity: Never  Other Topics Concern  . Not on file  Social History Narrative   Lives with wife, daughter Puja   Social Determinants of Health   Financial Resource Strain: Not on file  Food Insecurity: Not on file  Transportation Needs: Not on file  Physical Activity: Not on file  Stress: Not on file  Social Connections: Not on file   Past Surgical History:  Procedure Laterality Date  . circucision    . circumsision    . COLONOSCOPY N/A 04/04/2018    Procedure: COLONOSCOPY;  Surgeon: Leighton Ruff, MD;  Location: [facility];  Service: Endoscopy;  Laterality: N/A;  . CRANIOTOMY Left 04/05/2018   Procedure: Left Temporal CRANIOTOMY HEMATOMA EVACUATION of Intracranial Hemorrhage;  Surgeon: Ashok Pall, MD;  Location: [facility];  Service: Neurosurgery;  Laterality: Left;  . ERCP  11/08/2012   Procedure: ENDOSCOPIC RETROGRADE CHOLANGIOPANCREATOGRAPHY (ERCP);  Surgeon: Jeryl Columbia, MD;  Location: [facility];  Service: Gastroenterology;  Laterality: N/A;  . EUS  11/08/2012   Procedure: UPPER ENDOSCOPIC ULTRASOUND (EUS) LINEAR;  Surgeon: Arta Silence, MD;  Location: [facility];  Service: Endoscopy;  Laterality: N/A;  . FINE NEEDLE ASPIRATION  11/08/2012   Procedure: FINE NEEDLE ASPIRATION (FNA) LINEAR;  Surgeon: Arta Silence, MD;  [facility]: [facility];  Service: Endoscopy;  Laterality: N/A;  . HERNIA REPAIR     RIGHT  . LAPAROSCOPY  12/12/2012   Procedure: LAPAROSCOPY DIAGNOSTIC;  Surgeon: Stark Klein, MD;  Location: [facility];  Service: General;  Laterality: N/A;  . Left Foot Surgery    . Left Foot Surgery    . VASECTOMY    . WHIPPLE PROCEDURE  12/12/2012   Procedure: WHIPPLE PROCEDURE;  Surgeon: Stark Klein, MD;  Location: [facility];  Service: General;  Laterality: N/A;   Past Medical History:  Diagnosis Date  . Biliary  stricture 11/11/2012   S/p biliary stent 11/08/12  . Cancer ([place])   . Diabetes mellitus ([place]) 11/10/2012  . Diabetes mellitus without complication (HCC)    diet controlled  . HTN (hypertension) 11/10/2012  . Hypertension   . 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 Pancreatic cancer ([place])   . Pancreatitis 11/10/2012  . Seasonal allergies    BP (!) 149/82   Pulse (!) 52   Temp 98.6 F (37 C)   Ht 5\' 6"  (1.676 m)   Wt 157 lb (71.2 kg)   SpO2 98%   BMI 25.34 kg/m   Opioid Risk Score:   Fall Risk Score:  `1  Depression screen PHQ 2/9  Depression screen [facility] 2/9 10/14/2020 04/02/2019 05/09/2018   Decreased Interest 0 0 0  Down, Depressed, Hopeless 0 0 1  PHQ - 2 Score 0 0 1  Altered sleeping - - 0  Tired, decreased energy - - 1  Change in appetite - - 0  Feeling bad or failure about yourself  - - 0  Trouble concentrating - - 1  Moving slowly or fidgety/restless - - 0  Suicidal thoughts - - 0  PHQ-9 Score - - 3  Difficult doing work/chores - - Not difficult at all   Review of Systems  Constitutional: Negative.   HENT: Negative.   Eyes: Positive for visual disturbance.  Respiratory: Negative.   Cardiovascular: Negative.   Gastrointestinal: Negative.   Endocrine:       High/low blood sugar   Genitourinary: Negative.   Musculoskeletal: Negative.        Stiffness or tightness upper back  Skin: Negative.   Allergic/Immunologic: Negative.   Neurological: Positive for light-headedness.       Intermittent dysesthesias  Hematological: Negative.   Psychiatric/Behavioral: Negative.   All other systems reviewed and are negative.     Objective:   Physical Exam  Constitutional: No distress . Vital signs reviewed. HENT: Normocephalic.  Atraumatic. Eyes: EOMI. No discharge. Cardiovascular: No JVD.   Respiratory: Normal effort.  No stridor.   GI: Non-distended.   Skin: Warm and dry.  Intact. Psych: Normal mood.  Normal behavior. Musc: No edema in extremities.  No tenderness in extremities. Neurological: Alert Aphasia stable Right inattention, overall improving, patient more aware and able to compensate Motor: Grossly 5/5 throughout, unchanged    Assessment & Plan:  Male with history of pancreatic cancer, T2DM, HTN presents for follow up for left temporal lobe hemorrhage.   1. Left gaze preference with right inattention, right hemiparesis, receptive > expressive aphasia secondary to left temporal lobe hemorrhage  Completed therapies, encouraged HEP again  Cont follow up with Neurology  Cannot return to work   Driving with family, previously cleared for driving by  driving rehab, compensating, educated on avoiding busy roads  Cognitive deficits, continue exercise   Will refer to SLP  2. Chronic low back pain:   Cont stretching/core strengthening exercise  Exacerbated by excessive activity  Continue Baclofen 10 daily PRN, rarely using  Recommend sun salutations again  Discussed trigger point injections - patient would like to hold off at present and follow up when pain more severe  ?ESIs in past for ?radicular symptoms - inconsistent history  Will consider MRI, not necessary at this point   3. Coping  Improving  ?Released by Neuropsych  4. Transient dysesthesias - predominantly with temperature changes   Encouraged use of protective equipment in hot sunny days- glasses, cap, water intake, etc  Educated again  Patient would like to return in 6 months

## 2021-10-12 ENCOUNTER — Ambulatory Visit: Payer: Managed Care, Other (non HMO) | Admitting: Physical Medicine and Rehabilitation

## 2021-10-14 ENCOUNTER — Encounter: Payer: Managed Care, Other (non HMO) | Admitting: Physical Medicine and Rehabilitation

## 2021-10-19 ENCOUNTER — Encounter: Payer: Managed Care, Other (non HMO) | Admitting: Physical Medicine and Rehabilitation

## 2021-10-20 ENCOUNTER — Encounter
Payer: Managed Care, Other (non HMO) | Attending: Physical Medicine and Rehabilitation | Admitting: Physical Medicine and Rehabilitation

## 2021-10-20 ENCOUNTER — Other Ambulatory Visit: Payer: Self-pay

## 2021-10-20 ENCOUNTER — Encounter: Payer: Self-pay | Admitting: Physical Medicine and Rehabilitation

## 2021-10-20 VITALS — BP 152/81 | HR 54 | Temp 98.0°F | Ht 66.0 in | Wt 158.0 lb

## 2021-10-20 DIAGNOSIS — M545 Low back pain, unspecified: Secondary | ICD-10-CM | POA: Insufficient documentation

## 2021-10-20 DIAGNOSIS — I10 Essential (primary) hypertension: Secondary | ICD-10-CM | POA: Insufficient documentation

## 2021-10-20 DIAGNOSIS — I6990 Unspecified sequelae of unspecified cerebrovascular disease: Secondary | ICD-10-CM | POA: Insufficient documentation

## 2021-10-20 DIAGNOSIS — R4701 Aphasia: Secondary | ICD-10-CM | POA: Diagnosis not present

## 2021-10-20 DIAGNOSIS — G8929 Other chronic pain: Secondary | ICD-10-CM | POA: Insufficient documentation

## 2021-10-20 NOTE — Progress Notes (Signed)
Subjective:    Patient ID: Hector Pugh, male    DOB: 12/16/1960, 60 y.o.   MRN: [id number]  HPI  Male with history of pancreatic cancer (came out negative), T2DM, HTN presents for follow up for left temporal lobe hemorrhage.   Last clinic visit on 10/14/20.  Since that time, pt states he states he is ambulating, but no other exercises. He is driving, but wife believes it is dangerous. Back pain remains intermittent. He continues to have difficulty when being outside in the heat.    He has the right visual field cut and for 3 years he has difficulty with speech. He had very good knowledge before and he feels bad to just sit at home right now. He has a degree in education, chemistry. 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 He has worked for 35 years.  He has had prior ESI at [employer] which gave him benefits for 4-5 years. Right now his pain is well controlled.  When he has pain, he just relaxers  Pain Inventory Average Pain 0 Pain Right Now 0 My pain is aching and headache  In the last 24 hours, has pain interfered with the following? General activity 7 Relation with others 7 Enjoyment of life 8 What TIME of day is your pain at its worst? daytime Sleep (in general) Good  Pain is worse with: bending Pain improves with: medication Relief from Meds: 5  Family History  Problem Relation Age of Onset   Cancer Mother        esophageal carcinoma   Social History   Socioeconomic History   Marital status: Married    Spouse name: Not on file   Number of children: Not on file   Years of education: Not on file   Highest education level: Not on file  Occupational History   Not on file  Tobacco Use   Smoking status: Never   Smokeless tobacco: Never  Vaping Use   Vaping Use: Never used  Substance and Sexual Activity   Alcohol use: No   Drug use: No   Sexual activity: Never  Other Topics Concern   Not on file  Social  History Narrative   Lives with wife, daughter Puja   Social Determinants of Health   Financial Resource Strain: Not on file  Food Insecurity: Not on file  Transportation Needs: Not on file  Physical Activity: Not on file  Stress: Not on file  Social Connections: Not on file   Past Surgical History:  Procedure Laterality Date   circucision     circumsision     COLONOSCOPY N/A 04/04/2018   Procedure: COLONOSCOPY;  Surgeon: Leighton Ruff, MD;  [facility]: [facility];  Service: Endoscopy;  Laterality: N/A;   CRANIOTOMY Left 04/05/2018   Procedure: Left Temporal CRANIOTOMY HEMATOMA EVACUATION of Intracranial Hemorrhage;  Surgeon: Ashok Pall, MD;  Location: [facility];  Service: Neurosurgery;  Laterality: Left;   ERCP  11/08/2012   Procedure: ENDOSCOPIC RETROGRADE CHOLANGIOPANCREATOGRAPHY (ERCP);  Surgeon: Jeryl Columbia, MD;  Location: [facility];  Service: Gastroenterology;  Laterality: N/A;   EUS  11/08/2012   Procedure: UPPER ENDOSCOPIC ULTRASOUND (EUS) LINEAR;  Surgeon: Arta Silence, MD;  [facility];  Service: Endoscopy;  Laterality: N/A;   FINE NEEDLE ASPIRATION  11/08/2012   Procedure: FINE NEEDLE ASPIRATION (FNA) LINEAR;  Surgeon: Arta Silence, MD;  [facility];  Service: Endoscopy;  Laterality: N/A;   HERNIA REPAIR  RIGHT   LAPAROSCOPY  12/12/2012   Procedure: LAPAROSCOPY DIAGNOSTIC;  Surgeon: Stark Klein, MD;  [facility];  Service: General;  Laterality: N/A;   Left Foot Surgery     Left Foot Surgery     VASECTOMY     WHIPPLE PROCEDURE  12/12/2012   Procedure: WHIPPLE PROCEDURE;  Surgeon: Stark Klein, MD;  [facility];  Service: General;  Laterality: N/A;   Past Medical History:  Diagnosis Date   Biliary stricture 11/11/2012   S/p biliary stent 11/08/12   Cancer ([place])    Diabetes mellitus ([place]) 11/10/2012   Diabetes mellitus without complication (HCC)    diet controlled   HTN (hypertension) 11/10/2012   Hypertension    Obstructive jaundice  11/11/2012   With ampullary mass   Pancreatic cancer (HCC)    Pancreatitis 11/10/2012   Seasonal allergies    BP (!) 152/81   Pulse (!) 54   Temp 98 F (36.7 C)   Ht 5\' 6"  (1.676 m)   Wt 158 lb (71.7 kg)   SpO2 99%   BMI 25.50 kg/m   Opioid Risk Score:   Fall Risk Score:  `1  Depression screen PHQ 2/9  Depression screen [facility] 2/9 10/20/2021 10/14/2020 04/02/2019 05/09/2018  Decreased Interest 0 0 0 0  Down, Depressed, Hopeless 0 0 0 1  PHQ - 2 Score 0 0 0 1  Altered sleeping - - - 0  Tired, decreased energy - - - 1  Change in appetite - - - 0  Feeling bad or failure about yourself  - - - 0  Trouble concentrating - - - 1  Moving slowly or fidgety/restless - - - 0  Suicidal thoughts - - - 0  PHQ-9 Score - - - 3  Difficult doing work/chores - - - Not difficult at all   Review of Systems  Constitutional: Negative.   HENT: Negative.    Eyes:  Positive for pain. Negative for visual disturbance.  Respiratory: Negative.    Cardiovascular: Negative.   Gastrointestinal: Negative.   Endocrine:       High/low blood sugar   Genitourinary: Negative.   Musculoskeletal: Negative.        Stiffness or tightness upper back  Skin: Negative.   Allergic/Immunologic: Negative.   Neurological:  Positive for headaches. Negative for light-headedness.       Intermittent dysesthesias  Hematological: Negative.   Psychiatric/Behavioral: Negative.    All other systems reviewed and are negative.    Objective:   Physical Exam  Gen: no distress, normal appearing HEENT: oral mucosa pink and moist, NCAT Cardio: Reg rate Chest: normal effort, normal rate of breathing Abd: soft, non-distended Ext: no edema Psych: pleasant, normal affect Skin: intact Neuro: Musculoskeletal:  No tenderness in extremities. Neurological: Alert Aphasia stable- still with some expressive deficits, very mild.  Right inattention, overall improving, patient more aware and able to compensate Motor: Grossly 5/5  throughout, unchanged    Assessment & Plan:  Male with history of pancreatic cancer (came out negative), T2DM, HTN presents for follow up for left temporal lobe hemorrhage.   1. Left gaze preference with right inattention, right hemiparesis, receptive > expressive aphasia secondary to left temporal lobe hemorrhage  Completed therapies, encouraged HEP again  Continue follow up with Neurology  Cannot return to work   Driving with family, previously cleared for driving by driving rehab, compensating, educated on avoiding busy roads  Cognitive deficits, continue exercise   Will refer to SLP  2. Chronic  low back pain:   Cont stretching/core strengthening exercise  Exacerbated by excessive activity  Continue  Baclofen 10 daily PRN, rarely using- only when pain is severe.   Recommend sun salutations again  Discussed trigger point injections - patient would like to hold off at present and follow up when pain more severe  ?ESIs in past for ?radicular symptoms - inconsistent history  Will consider MRI, not necessary at this point -Discussed benefits of exercise in reducing pain. -Discussed following foods that may reduce pain: 1) Ginger (especially studied for arthritis)- reduce leukotriene production to decrease inflammation 2) Blueberries- high in phytonutrients that decrease inflammation 3) Salmon- marine omega-3s reduce joint swelling and pain 4) Pumpkin seeds- reduce inflammation 5) dark chocolate- reduces inflammation 6) turmeric- reduces inflammation 7) tart cherries - reduce pain and stiffness 8) extra virgin olive oil - its compound olecanthal helps to block prostaglandins  9) chili peppers- can be eaten or applied topically via capsaicin 10) mint- helpful for headache, muscle aches, joint pain, and itching 11) garlic- reduces inflammation  Link to further information on diet for chronic pain:  http://www.randall.com/    3. Coping  Improving  ?Released by Neuropsych  4. Transient dysesthesias - predominantly with temperature changes   Encouraged use of protective equipment in hot sunny days- glasses, cap, water intake, etc  Educated again  5. HTN: -BP is 152/81 today.  -Advised checking BP daily at home and logging results to bring into follow-up appointment with her PCP and myself. -Reviewed BP meds today.  -Advised regarding healthy foods that can help lower blood pressure and provided with a list: 1) citrus foods- high in vitamins and minerals 2) salmon and other fatty fish - reduces inflammation and oxylipins 3) swiss chard (leafy green)- high level of nitrates 4) pumpkin seeds- one of the best natural sources of magnesium 5) Beans and lentils- high in fiber, magnesium, and potassium 6) Berries- high in flavonoids 7) Amaranth (whole grain, can be cooked similarly to rice and oats)- high in magnesium and fiber 8) Pistachios- even more effective at reducing BP than other nuts 9) Carrots- high in phenolic compounds that relax blood vessels and reduce inflammation 10) Celery- contain phthalides that relax tissues of arterial walls 11) Tomatoes- can also improve cholesterol and reduce risk of heart disease 12) Broccoli- good source of magnesium, calcium, and potassium 13) Greek yogurt: high in potassium and calcium 14) Herbs and spices: Celery seed, cilantro, saffron, lemongrass, black cumin, ginseng, cinnamon, cardamom, sweet basil, and ginger 15) Chia and flax seeds- also help to lower cholesterol and blood sugar 16) Beets- high levels of nitrates that relax blood vessels  17) spinach and bananas- high in potassium  -Provided lise of supplements that can help with hypertension:  1) magnesium: one high quality brand is Bioptemizers since it contains all 7 types of magnesium, otherwise over the counter  magnesium gluconate 400mg  is a good option 2) B vitamins 3) vitamin D 4) potassium 5) CoQ10 6) L-arginine 7) Vitamin C 8) Beetroot -Educated that goal BP is 120/80. -Made goal to incorporate some of the above foods into diet.

## 2021-10-20 NOTE — Patient Instructions (Signed)
Foods that may reduce pain: 1) Ginger (especially studied for arthritis)- reduce leukotriene production to decrease inflammation 2) Blueberries- high in phytonutrients that decrease inflammation 3) Salmon- marine omega-3s reduce joint swelling and pain 4) Pumpkin seeds- reduce inflammation 5) dark chocolate- reduces inflammation 6) turmeric- reduces inflammation 7) tart cherries - reduce pain and stiffness 8) extra virgin olive oil - its compound olecanthal helps to block prostaglandins  9) chili peppers- can be eaten or applied topically via capsaicin 10) mint- helpful for headache, muscle aches, joint pain, and itching 11) garlic- reduces inflammation  Link to further information on diet for chronic pain: [email or web address]   HTN: -BP is 152/81 today.  -Advised checking BP daily at home and logging results to bring into follow-up appointment with her PCP and myself. -Reviewed BP meds today.  -Advised regarding healthy foods that can help lower blood pressure and provided with a list: 1) citrus foods- high in vitamins and minerals 2) salmon and other fatty fish - reduces inflammation and oxylipins 3) swiss chard (leafy green)- high level of nitrates 4) pumpkin seeds- one of the best natural sources of magnesium 5) Beans and lentils- high in fiber, magnesium, and potassium 6) Berries- high in flavonoids 7) Amaranth (whole grain, can be cooked similarly to rice and oats)- high in magnesium and fiber 8) Pistachios- even more effective at reducing BP than other nuts 9) Carrots- high in phenolic compounds that relax blood vessels and reduce inflammation 10) Celery- contain phthalides that relax tissues of arterial walls 11) Tomatoes- can also improve cholesterol and reduce risk of heart disease 12) Broccoli- good source of magnesium, calcium, and potassium 13) Greek yogurt: high in potassium and calcium 14) Herbs  and spices: Celery seed, cilantro, saffron, lemongrass, black cumin, ginseng, cinnamon, cardamom, sweet basil, and ginger 15) Chia and flax seeds- also help to lower cholesterol and blood sugar 16) Beets- high levels of nitrates that relax blood vessels  17) spinach and bananas- high in potassium  -Provided lise of supplements that can help with hypertension:  1) magnesium: one high quality brand is Bioptemizers since it contains all 7 types of magnesium, otherwise over the counter magnesium gluconate 400mg  is a good option 2) B vitamins 3) vitamin D 4) potassium 5) CoQ10 6) L-arginine 7) Vitamin C 8) Beetroot -Educated that goal BP is 120/80. -Made goal to incorporate some of the above foods into diet.

## 2022-02-04 ENCOUNTER — Other Ambulatory Visit: Payer: Self-pay | Admitting: Physical Medicine & Rehabilitation

## 2022-04-21 ENCOUNTER — Encounter: Payer: Self-pay | Admitting: Physical Medicine and Rehabilitation

## 2022-05-22 ENCOUNTER — Encounter: Payer: Self-pay | Admitting: Physical Medicine and Rehabilitation

## 2022-05-22 ENCOUNTER — Encounter: Payer: 59 | Attending: Physical Medicine and Rehabilitation | Admitting: Physical Medicine and Rehabilitation

## 2022-05-22 VITALS — BP 174/81 | HR 58 | Ht 66.0 in | Wt 159.8 lb

## 2022-05-22 DIAGNOSIS — J069 Acute upper respiratory infection, unspecified: Secondary | ICD-10-CM

## 2022-05-22 DIAGNOSIS — M5442 Lumbago with sciatica, left side: Secondary | ICD-10-CM

## 2022-05-22 DIAGNOSIS — G8929 Other chronic pain: Secondary | ICD-10-CM | POA: Diagnosis present

## 2022-05-22 MED ORDER — AZITHROMYCIN 250 MG PO TABS: ORAL_TABLET | ORAL | 0 refills | Status: AC

## 2022-05-22 MED ORDER — BACLOFEN 10 MG PO TABS: 10.0000 mg | ORAL_TABLET | Freq: Every day | ORAL | 3 refills | Status: AC | PRN

## 2022-05-22 NOTE — Progress Notes (Signed)
Subjective:    Patient ID: Hector Pugh, male    DOB: [birth year], 61 y.o.   MRN: [id number]  HPI  Male with history of pancreatic cancer (came out negative), T2DM, HTN presents for follow-up for left temporal lobe hemorrhage.   Last clinic visit on 10/14/20.  Since that time, pt states he states he is ambulating, but no other exercises. He is driving, but wife believes it is dangerous. Back pain remains intermittent. He continues to have difficulty when being outside in the heat.    He has the right visual field cut and for 3 years he has difficulty with speech. He had very good knowledge before and he feels bad to just sit at home right now. He has a degree in education, chemistry. 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 He has worked for 35 years.  He has had prior ESI at [employer] which gave him benefits for 4-5 years. Right now his pain is well controlled.  When he has pain, he just relaxers -he has pain radiating from the back into left leg.   -Has had a cold for last 2 days. Has cough with yellow phlegm. He has Robitussin at home but does not take. He took Advil sinus- phenylephrine doesn't work for him. He knows Guaifenacin and this dries him out more.   Pain Inventory Average Pain 7 Pain Right Now 5 My pain is aching and headache  In the last 24 hours, has pain interfered with the following? General activity 3 Relation with others 4 Enjoyment of life 6 What TIME of day is your pain at its worst? daytime Sleep (in general) Good  Pain is worse with: some activites Pain improves with: therapy/exercise and injections Relief from Meds: 8  Family History  Problem Relation Age of Onset   Cancer Mother        esophageal carcinoma   Social History   Socioeconomic History   Marital status: Married    Spouse name: Not on file   Number of children: Not on file   Years of education: Not on file   Highest education  level: Not on file  Occupational History   Not on file  Tobacco Use   Smoking status: Never   Smokeless tobacco: Never  Vaping Use   Vaping Use: Never used  Substance and Sexual Activity   Alcohol use: No   Drug use: No   Sexual activity: Never  Other Topics Concern   Not on file  Social History Narrative   Lives with wife, daughter Puja   Social Determinants of Health   Financial Resource Strain: Not on file  Food Insecurity: Not on file  Transportation Needs: Not on file  Physical Activity: Not on file  Stress: Not on file  Social Connections: Not on file   Past Surgical History:  Procedure Laterality Date   circucision     circumsision     COLONOSCOPY N/A 04/04/2018   Procedure: COLONOSCOPY;  Surgeon: Leighton Ruff, MD;  Location: [facility];  Service: Endoscopy;  Laterality: N/A;   CRANIOTOMY Left 04/05/2018   Procedure: Left Temporal CRANIOTOMY HEMATOMA EVACUATION of Intracranial Hemorrhage;  Surgeon: Ashok Pall, MD;  Location: [facility];  Service: Neurosurgery;  Laterality: Left;   ERCP  11/08/2012   Procedure: ENDOSCOPIC RETROGRADE CHOLANGIOPANCREATOGRAPHY (ERCP);  Surgeon: Jeryl Columbia, MD;  Location: [facility];  Service: Gastroenterology;  Laterality: N/A;   EUS  11/08/2012   Procedure:  UPPER ENDOSCOPIC ULTRASOUND (EUS) LINEAR;  Surgeon: Arta Silence, MD;  Location: [facility];  Service: Endoscopy;  Laterality: N/A;   FINE NEEDLE ASPIRATION  11/08/2012   Procedure: FINE NEEDLE ASPIRATION (FNA) LINEAR;  Surgeon: Arta Silence, MD;  Location: [facility];  Service: Endoscopy;  Laterality: N/A;   HERNIA REPAIR     RIGHT   LAPAROSCOPY  12/12/2012   Procedure: LAPAROSCOPY DIAGNOSTIC;  Surgeon: Stark Klein, MD;  Location: [facility];  Service: General;  Laterality: N/A;   Left Foot Surgery     Left Foot Surgery     VASECTOMY     WHIPPLE PROCEDURE  12/12/2012   Procedure: WHIPPLE PROCEDURE;  Surgeon: Stark Klein, MD;  Location: [facility];  Service: General;  Laterality:  N/A;   Past Medical History:  Diagnosis Date   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  Opioid Risk Score:   Fall Risk Score:  `1  Depression screen [facility] 2/9     10/20/2021    8:34 AM 10/14/2020    3:30 PM 04/02/2019    9:37 AM 05/09/2018   10:04 AM  Depression screen PH[facility] 2/9  Decreased Interest 0 0 0 0  Down, Depressed, Hopeless 0 0 0 1  PHQ - 2 Score 0 0 0 1  Altered sleeping    0  Tired, decreased energy    1  Change in appetite    0  Feeling bad or failure about yourself     0  Trouble concentrating    1  Moving slowly or fidgety/restless    0  Suicidal thoughts    0  PHQ-9 Score    3  Difficult doing work/chores    Not difficult at all   Review of Systems  Constitutional: Negative.   HENT: Negative.    Eyes:  Positive for pain. Negative for visual disturbance.  Respiratory: Negative.    Cardiovascular: Negative.   Gastrointestinal: Negative.   Endocrine:       High/low blood sugar   Genitourinary: Negative.   Musculoskeletal: Negative.        Stiffness or tightness upper back  Skin: Negative.   Allergic/Immunologic: Negative.   Neurological:  Positive for headaches. Negative for light-headedness.       Intermittent dysesthesias  Hematological: Negative.   Psychiatric/Behavioral: Negative.    All other systems reviewed and are negative.    Objective:   Physical Exam  Gen: no distress, normal appearing HEENT: oral mucosa pink and moist, NCAT Cardio: Reg rate Chest: normal effort, normal rate of breathing Abd: soft, non-distended Ext: no edema Psych: pleasant, normal affect Skin:  intact Neuro: Musculoskeletal:  No tenderness in extremities. Neurological: Alert. Good flexion and extension of spine. +Slump test on left.  Aphasia stable- still with some expressive deficits, very mild.  Right inattention, overall improving, patient more aware and able to compensate Motor: Grossly 5/5 throughout, unchanged    Assessment & Plan:  Male with history of pancreatic cancer (came out negative), T2DM, HTN presents for follow up for left temporal lobe hemorrhage.   1. 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 Chronic low back pain:   Cont stretching/core strengthening exercise  Exacerbated by excessive activity  Continue Baclofen 10 daily PRN, rarely using- only when pain is severe. Discussed his GI distress with this medication.  -Provided with a pain relief journal and discussed that it contains foods and lifestyle tips to naturally help to improve pain. Discussed that these lifestyle strategies are also very good for health unlike some medications which can have negative side effects. Discussed that the act of keeping a journal can be therapeutic and helpful to realize patterns what helps to trigger and alleviate pain.    Recommend sun salutations again  Discussed trigger point injections - patient would like to hold off at present and follow up when pain more severe  ?ESIs in past for ?radicular symptoms - inconsistent history, recommends   Will consider MRI, not necessary at this point -Discussed benefits of exercise in reducing pain. -Discussed following foods that may reduce pain: 1) Ginger  (especially studied for arthritis)- reduce leukotriene production to decrease inflammation 2) Blueberries- high in phytonutrients that decrease inflammation 3) Salmon- marine omega-3s reduce joint swelling and pain 4) Pumpkin seeds- reduce inflammation 5) dark chocolate- reduces inflammation 6) turmeric- reduces inflammation 7) tart cherries - reduce pain and stiffness 8) extra virgin olive oil - its compound olecanthal helps to block prostaglandins  9) chili peppers- can be eaten or applied topically via capsaicin 10) mint- helpful for headache, muscle aches, joint pain, and itching 11) garlic- reduces inflammation  Link to further information on diet for chronic pain: [email or web address]    3. Coping  Improving  ?Released by Neuropsych  4. Transient dysesthesias - predominantly with temperature changes   Encouraged use of protective equipment in hot sunny days- glasses, cap, water intake, etc  Educated again  5. HTN: -BP is 152/81 today.  -Advised checking BP daily at home and logging results to bring into follow-up appointment with her PCP and myself. -Reviewed BP meds today.  -Advised regarding healthy foods that can help lower blood pressure and provided with a list: 1) citrus foods- high in vitamins and minerals 2) salmon and other fatty fish - reduces inflammation and oxylipins 3) swiss chard (leafy green)- high level of nitrates 4) pumpkin seeds- one of the best natural sources of magnesium 5) Beans and lentils- high in fiber, magnesium, and potassium 6) Berries- high in flavonoids 7) Amaranth (whole grain, can be cooked similarly to rice and oats)- high in magnesium and fiber 8) Pistachios- even more effective at reducing BP than other nuts 9) Carrots- high in phenolic compounds that relax blood vessels and reduce inflammation 10) Celery- contain phthalides that relax tissues of arterial walls 11)  Tomatoes- can also improve cholesterol and reduce risk of heart disease 12) Broccoli- good source of magnesium, calcium, and potassium 13) Greek yogurt: high in potassium and calcium 14) Herbs and spices: Celery seed, cilantro, saffron, lemongrass, black cumin, ginseng, cinnamon, cardamom, sweet basil, and ginger 15) Chia and flax seeds- also help to lower cholesterol and blood sugar 16) Beets- high levels of nitrates that relax blood vessels  17) spinach and bananas- high in potassium  -Provided lise of  supplements that can help with hypertension:  1) magnesium: one high quality brand is Bioptemizers since it contains all 7 types of magnesium, otherwise over the counter magnesium gluconate '400mg'$  is a good option 2) B vitamins 3) vitamin D 4) potassium 5) CoQ10 6) L-arginine 7) Vitamin C 8) Beetroot -Educated that goal BP is 120/80. -Made goal to incorporate some of the above foods into diet.    6) Respiratory infection: Prescribed Z-Pak.

## 2022-06-19 ENCOUNTER — Encounter: Payer: 59 | Admitting: Physical Medicine and Rehabilitation

## 2022-11-06 ENCOUNTER — Encounter: Payer: 59 | Attending: Physical Medicine and Rehabilitation | Admitting: Physical Medicine and Rehabilitation

## 2023-08-09 ENCOUNTER — Other Ambulatory Visit: Payer: Self-pay | Admitting: Student in an Organized Health Care Education/Training Program

## 2023-08-09 DIAGNOSIS — M47816 Spondylosis without myelopathy or radiculopathy, lumbar region: Secondary | ICD-10-CM

## 2023-08-28 ENCOUNTER — Encounter: Payer: Self-pay | Admitting: Student in an Organized Health Care Education/Training Program

## 2023-09-02 ENCOUNTER — Ambulatory Visit
Admission: RE | Admit: 2023-09-02 | Discharge: 2023-09-02 | Disposition: A | Discharge status: Home / Self Care | Payer: Medicare HMO | Source: Ambulatory Visit | Attending: Student in an Organized Health Care Education/Training Program | Admitting: Student in an Organized Health Care Education/Training Program

## 2023-09-02 DIAGNOSIS — M47816 Spondylosis without myelopathy or radiculopathy, lumbar region: Secondary | ICD-10-CM
